# Patient Record
Sex: Female | Born: 1950 | Race: Black or African American | Hispanic: No | Smoking: Former smoker
Health system: Southern US, Community
[De-identification: ages and names within clinical notes are randomized; demographics above are authoritative.]

## PROBLEM LIST (undated history)

## (undated) DIAGNOSIS — I671 Cerebral aneurysm, nonruptured: Secondary | ICD-10-CM

## (undated) DIAGNOSIS — I10 Essential (primary) hypertension: Secondary | ICD-10-CM

## (undated) DIAGNOSIS — E119 Type 2 diabetes mellitus without complications: Secondary | ICD-10-CM

## (undated) DIAGNOSIS — J45909 Unspecified asthma, uncomplicated: Secondary | ICD-10-CM

## (undated) DIAGNOSIS — J449 Chronic obstructive pulmonary disease, unspecified: Secondary | ICD-10-CM

## (undated) DIAGNOSIS — I509 Heart failure, unspecified: Secondary | ICD-10-CM

## (undated) DIAGNOSIS — I639 Cerebral infarction, unspecified: Secondary | ICD-10-CM

## (undated) DIAGNOSIS — M1990 Unspecified osteoarthritis, unspecified site: Secondary | ICD-10-CM

## (undated) HISTORY — DX: Heart failure, unspecified: I50.9

---

## 2001-11-16 DIAGNOSIS — I639 Cerebral infarction, unspecified: Secondary | ICD-10-CM

## 2001-11-16 HISTORY — DX: Cerebral infarction, unspecified: I63.9

## 2004-09-03 ENCOUNTER — Ambulatory Visit: Payer: Self-pay

## 2004-11-21 ENCOUNTER — Ambulatory Visit: Payer: Self-pay | Admitting: Family Medicine

## 2004-12-19 ENCOUNTER — Ambulatory Visit: Payer: Self-pay | Admitting: Family Medicine

## 2005-01-02 ENCOUNTER — Ambulatory Visit: Payer: Self-pay | Admitting: Family Medicine

## 2005-01-30 ENCOUNTER — Ambulatory Visit: Payer: Self-pay | Admitting: Family Medicine

## 2005-02-06 ENCOUNTER — Ambulatory Visit: Payer: Self-pay | Admitting: Family Medicine

## 2005-02-20 ENCOUNTER — Ambulatory Visit: Payer: Self-pay | Admitting: Family Medicine

## 2005-03-23 ENCOUNTER — Ambulatory Visit: Payer: Self-pay | Admitting: Family Medicine

## 2005-04-14 ENCOUNTER — Ambulatory Visit (HOSPITAL_BASED_OUTPATIENT_CLINIC_OR_DEPARTMENT_OTHER): Admission: RE | Admit: 2005-04-14 | Discharge: 2005-04-14 | Payer: Self-pay | Admitting: Family Medicine

## 2005-04-14 ENCOUNTER — Encounter: Payer: Self-pay | Admitting: Pulmonary Disease

## 2005-04-30 ENCOUNTER — Ambulatory Visit: Payer: Self-pay | Admitting: Family Medicine

## 2005-05-01 ENCOUNTER — Ambulatory Visit: Payer: Self-pay | Admitting: Pulmonary Disease

## 2005-05-14 ENCOUNTER — Ambulatory Visit: Payer: Self-pay | Admitting: Family Medicine

## 2005-06-05 ENCOUNTER — Ambulatory Visit: Payer: Self-pay | Admitting: Pulmonary Disease

## 2005-06-18 ENCOUNTER — Other Ambulatory Visit: Admission: RE | Admit: 2005-06-18 | Discharge: 2005-06-18 | Payer: Self-pay | Admitting: Family Medicine

## 2005-06-18 ENCOUNTER — Ambulatory Visit: Payer: Self-pay | Admitting: Family Medicine

## 2005-06-30 ENCOUNTER — Ambulatory Visit: Payer: Self-pay | Admitting: Family Medicine

## 2005-07-13 ENCOUNTER — Ambulatory Visit: Payer: Self-pay | Admitting: Gastroenterology

## 2005-07-15 ENCOUNTER — Encounter: Admission: RE | Admit: 2005-07-15 | Discharge: 2005-07-15 | Payer: Self-pay | Admitting: Family Medicine

## 2005-07-16 ENCOUNTER — Ambulatory Visit: Payer: Self-pay | Admitting: Pulmonary Disease

## 2005-07-23 ENCOUNTER — Ambulatory Visit: Payer: Self-pay | Admitting: Family Medicine

## 2005-08-10 ENCOUNTER — Ambulatory Visit: Payer: Self-pay | Admitting: Cardiology

## 2005-08-12 ENCOUNTER — Encounter (INDEPENDENT_AMBULATORY_CARE_PROVIDER_SITE_OTHER): Payer: Self-pay | Admitting: Specialist

## 2005-08-12 ENCOUNTER — Ambulatory Visit: Payer: Self-pay | Admitting: Gastroenterology

## 2005-08-17 ENCOUNTER — Ambulatory Visit: Payer: Self-pay | Admitting: Cardiology

## 2005-08-21 ENCOUNTER — Ambulatory Visit: Payer: Self-pay | Admitting: Family Medicine

## 2005-08-21 ENCOUNTER — Ambulatory Visit: Payer: Self-pay | Admitting: Internal Medicine

## 2005-08-28 ENCOUNTER — Ambulatory Visit: Payer: Self-pay | Admitting: Internal Medicine

## 2005-09-01 ENCOUNTER — Ambulatory Visit: Payer: Self-pay | Admitting: Family Medicine

## 2005-09-07 ENCOUNTER — Ambulatory Visit: Payer: Self-pay | Admitting: Family Medicine

## 2005-09-11 ENCOUNTER — Ambulatory Visit: Payer: Self-pay | Admitting: Cardiology

## 2005-09-25 ENCOUNTER — Ambulatory Visit: Payer: Self-pay | Admitting: Cardiology

## 2005-10-12 ENCOUNTER — Ambulatory Visit: Payer: Self-pay | Admitting: Family Medicine

## 2005-10-16 ENCOUNTER — Ambulatory Visit: Payer: Self-pay | Admitting: Family Medicine

## 2005-10-19 ENCOUNTER — Ambulatory Visit: Payer: Self-pay | Admitting: Cardiology

## 2005-10-30 ENCOUNTER — Ambulatory Visit: Payer: Self-pay | Admitting: Cardiology

## 2005-11-02 ENCOUNTER — Ambulatory Visit (HOSPITAL_COMMUNITY): Admission: RE | Admit: 2005-11-02 | Discharge: 2005-11-02 | Payer: Self-pay | Admitting: Obstetrics and Gynecology

## 2005-11-04 ENCOUNTER — Ambulatory Visit: Payer: Self-pay | Admitting: Internal Medicine

## 2005-11-10 ENCOUNTER — Ambulatory Visit: Payer: Self-pay | Admitting: Internal Medicine

## 2005-11-21 ENCOUNTER — Encounter: Admission: RE | Admit: 2005-11-21 | Discharge: 2005-11-21 | Payer: Self-pay | Admitting: Family Medicine

## 2005-11-23 ENCOUNTER — Ambulatory Visit: Payer: Self-pay | Admitting: Cardiology

## 2005-11-26 ENCOUNTER — Other Ambulatory Visit: Admission: RE | Admit: 2005-11-26 | Discharge: 2005-11-26 | Payer: Self-pay | Admitting: Obstetrics and Gynecology

## 2005-12-07 ENCOUNTER — Ambulatory Visit: Payer: Self-pay | Admitting: Internal Medicine

## 2005-12-09 ENCOUNTER — Ambulatory Visit: Payer: Self-pay | Admitting: Pulmonary Disease

## 2006-01-07 ENCOUNTER — Ambulatory Visit: Payer: Self-pay | Admitting: Internal Medicine

## 2006-02-02 ENCOUNTER — Ambulatory Visit: Payer: Self-pay | Admitting: Cardiology

## 2006-02-23 ENCOUNTER — Ambulatory Visit: Payer: Self-pay | Admitting: Cardiology

## 2006-02-26 ENCOUNTER — Ambulatory Visit: Payer: Self-pay | Admitting: Family Medicine

## 2006-03-23 ENCOUNTER — Ambulatory Visit: Payer: Self-pay | Admitting: *Deleted

## 2006-04-06 ENCOUNTER — Ambulatory Visit: Payer: Self-pay | Admitting: *Deleted

## 2006-04-13 ENCOUNTER — Ambulatory Visit: Payer: Self-pay | Admitting: Internal Medicine

## 2006-04-22 ENCOUNTER — Ambulatory Visit: Payer: Self-pay | Admitting: Internal Medicine

## 2006-05-10 ENCOUNTER — Ambulatory Visit: Payer: Self-pay | Admitting: Cardiology

## 2006-06-01 ENCOUNTER — Ambulatory Visit: Payer: Self-pay | Admitting: Family Medicine

## 2006-06-07 ENCOUNTER — Ambulatory Visit: Payer: Self-pay | Admitting: Cardiology

## 2006-06-17 ENCOUNTER — Ambulatory Visit: Payer: Self-pay | Admitting: Family Medicine

## 2006-06-25 ENCOUNTER — Ambulatory Visit: Payer: Self-pay | Admitting: Family Medicine

## 2006-06-28 ENCOUNTER — Ambulatory Visit: Payer: Self-pay | Admitting: Cardiovascular Disease

## 2006-07-26 ENCOUNTER — Ambulatory Visit: Payer: Self-pay | Admitting: Cardiology

## 2006-08-23 ENCOUNTER — Ambulatory Visit: Payer: Self-pay | Admitting: Cardiovascular Disease

## 2006-08-30 ENCOUNTER — Ambulatory Visit: Payer: Self-pay | Admitting: Family Medicine

## 2006-09-13 ENCOUNTER — Ambulatory Visit: Payer: Self-pay | Admitting: Family Medicine

## 2006-09-13 LAB — CONVERTED CEMR LAB
AST: 21 units/L (ref 0–37)
Albumin: 3.8 g/dL (ref 3.5–5.2)
BUN: 8 mg/dL (ref 6–23)
Basophils Relative: 1.2 % — ABNORMAL HIGH (ref 0.0–1.0)
Bilirubin, Direct: 0.1 mg/dL (ref 0.0–0.3)
CO2: 32 meq/L (ref 19–32)
Calcium: 8.9 mg/dL (ref 8.4–10.5)
Chloride: 109 meq/L (ref 96–112)
Eosinophil percent: 1.7 % (ref 0.0–5.0)
GFR calc non Af Amer: 69 mL/min
Glucose, Bld: 109 mg/dL — ABNORMAL HIGH (ref 70–99)
HCT: 43.8 % (ref 36.0–46.0)
Hemoglobin: 14.3 g/dL (ref 12.0–15.0)
Lymphocytes Relative: 40.3 % (ref 12.0–46.0)
MCV: 92.4 fL (ref 78.0–100.0)
Monocytes Relative: 9.4 % (ref 3.0–11.0)
Neutro Abs: 2.4 10*3/uL (ref 1.4–7.7)
Neutrophils Relative %: 47.4 % (ref 43.0–77.0)
Platelets: 233 10*3/uL (ref 150–400)
RDW: 14.2 % (ref 11.5–14.6)
Sodium: 146 meq/L — ABNORMAL HIGH (ref 135–145)
Total Bilirubin: 0.5 mg/dL (ref 0.3–1.2)
Total Protein: 7.1 g/dL (ref 6.0–8.3)
WBC: 5.2 10*3/uL (ref 4.5–10.5)

## 2006-09-20 ENCOUNTER — Ambulatory Visit: Payer: Self-pay | Admitting: Cardiology

## 2006-10-05 ENCOUNTER — Ambulatory Visit: Payer: Self-pay | Admitting: Family Medicine

## 2006-10-20 ENCOUNTER — Ambulatory Visit: Payer: Self-pay | Admitting: Cardiology

## 2006-11-17 ENCOUNTER — Ambulatory Visit: Payer: Self-pay | Admitting: *Deleted

## 2006-12-16 ENCOUNTER — Ambulatory Visit: Payer: Self-pay | Admitting: Cardiology

## 2007-01-07 ENCOUNTER — Ambulatory Visit: Payer: Self-pay | Admitting: Family Medicine

## 2007-01-07 LAB — CONVERTED CEMR LAB: CO2: 24 meq/L (ref 19–32)

## 2007-02-16 ENCOUNTER — Ambulatory Visit: Payer: Self-pay | Admitting: Family Medicine

## 2007-02-21 ENCOUNTER — Ambulatory Visit: Payer: Self-pay | Admitting: Family Medicine

## 2007-02-21 ENCOUNTER — Encounter: Payer: Self-pay | Admitting: Family Medicine

## 2007-02-21 LAB — CONVERTED CEMR LAB
INR: 19.1
Prothrombin Time: 2.5 s

## 2007-03-21 DIAGNOSIS — F3289 Other specified depressive episodes: Secondary | ICD-10-CM | POA: Insufficient documentation

## 2007-03-21 DIAGNOSIS — E785 Hyperlipidemia, unspecified: Secondary | ICD-10-CM

## 2007-03-21 DIAGNOSIS — M129 Arthropathy, unspecified: Secondary | ICD-10-CM | POA: Insufficient documentation

## 2007-03-21 DIAGNOSIS — Z86718 Personal history of other venous thrombosis and embolism: Secondary | ICD-10-CM

## 2007-03-21 DIAGNOSIS — J449 Chronic obstructive pulmonary disease, unspecified: Secondary | ICD-10-CM

## 2007-03-21 DIAGNOSIS — F329 Major depressive disorder, single episode, unspecified: Secondary | ICD-10-CM | POA: Insufficient documentation

## 2007-03-21 DIAGNOSIS — I671 Cerebral aneurysm, nonruptured: Secondary | ICD-10-CM

## 2007-03-23 ENCOUNTER — Ambulatory Visit: Payer: Self-pay | Admitting: Family Medicine

## 2007-03-24 ENCOUNTER — Encounter: Admission: RE | Admit: 2007-03-24 | Discharge: 2007-03-24 | Payer: Self-pay | Admitting: Family Medicine

## 2007-04-06 ENCOUNTER — Encounter: Payer: Self-pay | Admitting: Family Medicine

## 2007-04-06 ENCOUNTER — Ambulatory Visit: Payer: Self-pay | Admitting: Family Medicine

## 2007-04-06 LAB — CONVERTED CEMR LAB
ALT: 27 units/L (ref 0–40)
Albumin: 3.9 g/dL (ref 3.5–5.2)
Alkaline Phosphatase: 60 units/L (ref 39–117)
Bilirubin, Direct: 0.1 mg/dL (ref 0.0–0.3)

## 2007-04-18 ENCOUNTER — Encounter: Payer: Self-pay | Admitting: Family Medicine

## 2007-04-20 ENCOUNTER — Ambulatory Visit: Payer: Self-pay | Admitting: Family Medicine

## 2007-04-20 DIAGNOSIS — I10 Essential (primary) hypertension: Secondary | ICD-10-CM

## 2007-04-20 LAB — CONVERTED CEMR LAB
INR: 2
Protein, U semiquant: NEGATIVE
Prothrombin Time: 17.3 s
Urobilinogen, UA: NEGATIVE
pH: 6

## 2007-04-22 ENCOUNTER — Encounter (INDEPENDENT_AMBULATORY_CARE_PROVIDER_SITE_OTHER): Payer: Self-pay | Admitting: *Deleted

## 2007-04-25 ENCOUNTER — Encounter: Payer: Self-pay | Admitting: Family Medicine

## 2007-06-01 ENCOUNTER — Ambulatory Visit: Payer: Self-pay | Admitting: Family Medicine

## 2007-06-29 ENCOUNTER — Ambulatory Visit: Payer: Self-pay | Admitting: Family Medicine

## 2007-06-29 LAB — CONVERTED CEMR LAB: INR: 2.9

## 2007-07-12 ENCOUNTER — Encounter: Payer: Self-pay | Admitting: Family Medicine

## 2007-07-25 ENCOUNTER — Ambulatory Visit: Payer: Self-pay | Admitting: Family Medicine

## 2007-07-25 LAB — CONVERTED CEMR LAB: INR: 1.7

## 2007-07-27 LAB — CONVERTED CEMR LAB
ALT: 24 units/L (ref 0–35)
Albumin: 3.8 g/dL (ref 3.5–5.2)
Alkaline Phosphatase: 53 units/L (ref 39–117)

## 2007-08-08 ENCOUNTER — Ambulatory Visit: Payer: Self-pay | Admitting: Family Medicine

## 2007-09-19 ENCOUNTER — Ambulatory Visit: Payer: Self-pay | Admitting: Family Medicine

## 2007-09-19 LAB — CONVERTED CEMR LAB: INR: 2.4

## 2007-10-17 ENCOUNTER — Ambulatory Visit: Payer: Self-pay | Admitting: Family Medicine

## 2007-10-17 DIAGNOSIS — G4733 Obstructive sleep apnea (adult) (pediatric): Secondary | ICD-10-CM

## 2007-10-17 LAB — CONVERTED CEMR LAB: INR: 2.1

## 2007-11-15 ENCOUNTER — Ambulatory Visit: Payer: Self-pay | Admitting: Pulmonary Disease

## 2007-11-22 ENCOUNTER — Encounter: Payer: Self-pay | Admitting: Pulmonary Disease

## 2007-12-07 ENCOUNTER — Ambulatory Visit: Payer: Self-pay | Admitting: Family Medicine

## 2007-12-07 LAB — CONVERTED CEMR LAB: Prothrombin Time: 20.4 s

## 2007-12-13 ENCOUNTER — Encounter: Payer: Self-pay | Admitting: Pulmonary Disease

## 2007-12-14 ENCOUNTER — Ambulatory Visit: Payer: Self-pay | Admitting: Family Medicine

## 2007-12-14 ENCOUNTER — Telehealth (INDEPENDENT_AMBULATORY_CARE_PROVIDER_SITE_OTHER): Payer: Self-pay | Admitting: *Deleted

## 2007-12-21 LAB — CONVERTED CEMR LAB
AST: 19 units/L (ref 0–37)
Albumin: 3.9 g/dL (ref 3.5–5.2)
Bilirubin, Direct: 0.1 mg/dL (ref 0.0–0.3)
CO2: 33 meq/L — ABNORMAL HIGH (ref 19–32)
Calcium: 8.9 mg/dL (ref 8.4–10.5)
Chloride: 108 meq/L (ref 96–112)
Creatinine, Ser: 0.8 mg/dL (ref 0.4–1.2)
GFR calc Af Amer: 95 mL/min
Sodium: 145 meq/L (ref 135–145)
Total Bilirubin: 0.6 mg/dL (ref 0.3–1.2)

## 2007-12-28 ENCOUNTER — Telehealth (INDEPENDENT_AMBULATORY_CARE_PROVIDER_SITE_OTHER): Payer: Self-pay | Admitting: *Deleted

## 2007-12-29 ENCOUNTER — Encounter (INDEPENDENT_AMBULATORY_CARE_PROVIDER_SITE_OTHER): Payer: Self-pay | Admitting: *Deleted

## 2008-01-02 ENCOUNTER — Ambulatory Visit: Payer: Self-pay | Admitting: Cardiovascular Disease

## 2008-01-11 ENCOUNTER — Ambulatory Visit: Payer: Self-pay | Admitting: Family Medicine

## 2008-01-11 LAB — CONVERTED CEMR LAB
Bilirubin Urine: NEGATIVE
Glucose, Urine, Semiquant: NEGATIVE
INR: 3.1
Nitrite: NEGATIVE
Protein, U semiquant: NEGATIVE
pH: 6

## 2008-01-25 ENCOUNTER — Telehealth (INDEPENDENT_AMBULATORY_CARE_PROVIDER_SITE_OTHER): Payer: Self-pay | Admitting: *Deleted

## 2008-01-25 ENCOUNTER — Ambulatory Visit: Payer: Self-pay | Admitting: Family Medicine

## 2008-01-25 LAB — CONVERTED CEMR LAB

## 2008-02-07 ENCOUNTER — Ambulatory Visit: Payer: Self-pay | Admitting: Family Medicine

## 2008-02-07 LAB — CONVERTED CEMR LAB: INR: 5.1

## 2008-02-08 ENCOUNTER — Ambulatory Visit: Payer: Self-pay | Admitting: Family Medicine

## 2008-02-08 LAB — CONVERTED CEMR LAB: INR: 3

## 2008-02-22 ENCOUNTER — Ambulatory Visit: Payer: Self-pay | Admitting: Family Medicine

## 2008-02-22 LAB — CONVERTED CEMR LAB

## 2008-03-08 ENCOUNTER — Ambulatory Visit: Payer: Self-pay | Admitting: Family Medicine

## 2008-03-08 LAB — CONVERTED CEMR LAB: INR: 4.9

## 2008-04-06 ENCOUNTER — Ambulatory Visit: Payer: Self-pay | Admitting: Internal Medicine

## 2008-04-06 LAB — CONVERTED CEMR LAB: INR: 1.5

## 2008-04-20 ENCOUNTER — Ambulatory Visit: Payer: Self-pay | Admitting: Family Medicine

## 2008-04-20 LAB — CONVERTED CEMR LAB: Prothrombin Time: 19.2 s

## 2008-05-04 ENCOUNTER — Ambulatory Visit: Payer: Self-pay | Admitting: Family Medicine

## 2008-05-04 DIAGNOSIS — M545 Low back pain: Secondary | ICD-10-CM | POA: Insufficient documentation

## 2008-05-14 ENCOUNTER — Encounter: Admission: RE | Admit: 2008-05-14 | Discharge: 2008-05-14 | Payer: Self-pay | Admitting: Family Medicine

## 2008-05-16 ENCOUNTER — Telehealth (INDEPENDENT_AMBULATORY_CARE_PROVIDER_SITE_OTHER): Payer: Self-pay | Admitting: *Deleted

## 2008-06-05 ENCOUNTER — Ambulatory Visit: Payer: Self-pay | Admitting: Family Medicine

## 2008-06-05 LAB — CONVERTED CEMR LAB
INR: 1.7
Prothrombin Time: 15.9 s

## 2008-06-20 ENCOUNTER — Encounter: Payer: Self-pay | Admitting: Family Medicine

## 2008-06-20 ENCOUNTER — Ambulatory Visit: Payer: Self-pay | Admitting: Family Medicine

## 2008-06-20 LAB — CONVERTED CEMR LAB
INR: 1.6
Prothrombin Time: 1.7 s

## 2008-06-21 ENCOUNTER — Encounter (INDEPENDENT_AMBULATORY_CARE_PROVIDER_SITE_OTHER): Payer: Self-pay | Admitting: *Deleted

## 2008-06-25 ENCOUNTER — Telehealth: Payer: Self-pay | Admitting: Family Medicine

## 2008-06-28 ENCOUNTER — Ambulatory Visit: Payer: Self-pay | Admitting: Family Medicine

## 2008-06-28 DIAGNOSIS — L259 Unspecified contact dermatitis, unspecified cause: Secondary | ICD-10-CM | POA: Insufficient documentation

## 2008-07-04 ENCOUNTER — Ambulatory Visit: Payer: Self-pay | Admitting: Family Medicine

## 2008-07-04 LAB — CONVERTED CEMR LAB: Prothrombin Time: 13.9 s

## 2008-07-18 ENCOUNTER — Ambulatory Visit: Payer: Self-pay | Admitting: Family Medicine

## 2008-07-18 LAB — CONVERTED CEMR LAB: INR: 1.8

## 2008-07-25 ENCOUNTER — Ambulatory Visit: Payer: Self-pay | Admitting: Family Medicine

## 2008-07-25 ENCOUNTER — Telehealth (INDEPENDENT_AMBULATORY_CARE_PROVIDER_SITE_OTHER): Payer: Self-pay | Admitting: *Deleted

## 2008-07-25 LAB — CONVERTED CEMR LAB
INR: 3.3
Prothrombin Time: 21.8 s

## 2008-08-08 ENCOUNTER — Ambulatory Visit: Payer: Self-pay | Admitting: Family Medicine

## 2008-09-07 ENCOUNTER — Ambulatory Visit: Payer: Self-pay | Admitting: Family Medicine

## 2008-09-07 LAB — CONVERTED CEMR LAB
INR: 2.3
Prothrombin Time: 18.5 s

## 2008-09-10 ENCOUNTER — Telehealth (INDEPENDENT_AMBULATORY_CARE_PROVIDER_SITE_OTHER): Payer: Self-pay | Admitting: *Deleted

## 2008-09-12 ENCOUNTER — Ambulatory Visit: Payer: Self-pay | Admitting: Family Medicine

## 2008-09-26 ENCOUNTER — Ambulatory Visit: Payer: Self-pay | Admitting: Family Medicine

## 2008-09-26 LAB — CONVERTED CEMR LAB

## 2008-11-01 ENCOUNTER — Ambulatory Visit: Payer: Self-pay | Admitting: Family Medicine

## 2008-11-01 LAB — CONVERTED CEMR LAB: INR: 2.3

## 2008-11-15 ENCOUNTER — Encounter: Payer: Self-pay | Admitting: Family Medicine

## 2008-11-22 ENCOUNTER — Telehealth (INDEPENDENT_AMBULATORY_CARE_PROVIDER_SITE_OTHER): Payer: Self-pay | Admitting: *Deleted

## 2008-11-29 ENCOUNTER — Ambulatory Visit: Payer: Self-pay | Admitting: Family Medicine

## 2008-11-29 LAB — CONVERTED CEMR LAB
INR: 2.4
Prothrombin Time: 18.8 s

## 2008-12-03 ENCOUNTER — Encounter: Payer: Self-pay | Admitting: Pulmonary Disease

## 2008-12-10 ENCOUNTER — Encounter: Payer: Self-pay | Admitting: Family Medicine

## 2008-12-18 ENCOUNTER — Encounter: Payer: Self-pay | Admitting: Family Medicine

## 2008-12-20 ENCOUNTER — Encounter: Payer: Self-pay | Admitting: Family Medicine

## 2008-12-24 ENCOUNTER — Ambulatory Visit: Payer: Self-pay | Admitting: Family Medicine

## 2008-12-24 DIAGNOSIS — R252 Cramp and spasm: Secondary | ICD-10-CM

## 2008-12-24 LAB — CONVERTED CEMR LAB
INR: 2.3
Prothrombin Time: 18.7 s

## 2008-12-27 ENCOUNTER — Ambulatory Visit: Payer: Self-pay | Admitting: Family Medicine

## 2009-01-09 LAB — CONVERTED CEMR LAB
Albumin: 3.9 g/dL (ref 3.5–5.2)
BUN: 10 mg/dL (ref 6–23)
Chloride: 107 meq/L (ref 96–112)
Cholesterol: 210 mg/dL (ref 0–200)
Creatinine, Ser: 0.7 mg/dL (ref 0.4–1.2)
Direct LDL: 141.5 mg/dL
GFR calc Af Amer: 110 mL/min
GFR calc non Af Amer: 91 mL/min
HDL: 46.9 mg/dL (ref >39.0)
Total Bilirubin: 0.8 mg/dL (ref 0.3–1.2)
Total CHOL/HDL Ratio: 4.5
VLDL: 17 mg/dL (ref 0–40)

## 2009-01-10 ENCOUNTER — Telehealth (INDEPENDENT_AMBULATORY_CARE_PROVIDER_SITE_OTHER): Payer: Self-pay | Admitting: *Deleted

## 2009-01-21 ENCOUNTER — Ambulatory Visit: Payer: Self-pay | Admitting: Family Medicine

## 2009-01-21 LAB — CONVERTED CEMR LAB
INR: 2.9
Prothrombin Time: 20.6 s

## 2009-01-22 ENCOUNTER — Encounter: Payer: Self-pay | Admitting: Family Medicine

## 2009-02-07 ENCOUNTER — Other Ambulatory Visit: Admission: RE | Admit: 2009-02-07 | Discharge: 2009-02-07 | Payer: Self-pay | Admitting: Family Medicine

## 2009-02-07 ENCOUNTER — Ambulatory Visit: Payer: Self-pay | Admitting: Family Medicine

## 2009-02-07 ENCOUNTER — Encounter: Payer: Self-pay | Admitting: Family Medicine

## 2009-02-07 DIAGNOSIS — K921 Melena: Secondary | ICD-10-CM

## 2009-02-08 ENCOUNTER — Encounter (INDEPENDENT_AMBULATORY_CARE_PROVIDER_SITE_OTHER): Payer: Self-pay | Admitting: *Deleted

## 2009-02-11 ENCOUNTER — Encounter (INDEPENDENT_AMBULATORY_CARE_PROVIDER_SITE_OTHER): Payer: Self-pay | Admitting: *Deleted

## 2009-02-22 ENCOUNTER — Telehealth (INDEPENDENT_AMBULATORY_CARE_PROVIDER_SITE_OTHER): Payer: Self-pay | Admitting: *Deleted

## 2009-02-22 ENCOUNTER — Ambulatory Visit: Payer: Self-pay | Admitting: Family Medicine

## 2009-02-22 LAB — CONVERTED CEMR LAB: Prothrombin Time: 19.7 s

## 2009-02-25 ENCOUNTER — Telehealth (INDEPENDENT_AMBULATORY_CARE_PROVIDER_SITE_OTHER): Payer: Self-pay | Admitting: *Deleted

## 2009-02-28 ENCOUNTER — Encounter: Payer: Self-pay | Admitting: Family Medicine

## 2009-03-01 ENCOUNTER — Encounter: Admission: RE | Admit: 2009-03-01 | Discharge: 2009-03-01 | Payer: Self-pay | Admitting: Family Medicine

## 2009-03-01 ENCOUNTER — Encounter: Payer: Self-pay | Admitting: Family Medicine

## 2009-03-01 ENCOUNTER — Telehealth (INDEPENDENT_AMBULATORY_CARE_PROVIDER_SITE_OTHER): Payer: Self-pay | Admitting: *Deleted

## 2009-03-06 ENCOUNTER — Encounter (INDEPENDENT_AMBULATORY_CARE_PROVIDER_SITE_OTHER): Payer: Self-pay | Admitting: *Deleted

## 2009-03-14 ENCOUNTER — Encounter: Payer: Self-pay | Admitting: Family Medicine

## 2009-03-18 ENCOUNTER — Telehealth (INDEPENDENT_AMBULATORY_CARE_PROVIDER_SITE_OTHER): Payer: Self-pay | Admitting: *Deleted

## 2009-03-22 ENCOUNTER — Ambulatory Visit: Payer: Self-pay | Admitting: Family Medicine

## 2009-03-22 LAB — CONVERTED CEMR LAB: INR: 2.6

## 2009-03-29 ENCOUNTER — Encounter: Payer: Self-pay | Admitting: Family Medicine

## 2009-04-12 ENCOUNTER — Encounter: Payer: Self-pay | Admitting: Family Medicine

## 2009-04-16 ENCOUNTER — Encounter: Payer: Self-pay | Admitting: Family Medicine

## 2009-04-19 ENCOUNTER — Ambulatory Visit: Payer: Self-pay | Admitting: Family Medicine

## 2009-04-19 DIAGNOSIS — B354 Tinea corporis: Secondary | ICD-10-CM | POA: Insufficient documentation

## 2009-04-19 LAB — CONVERTED CEMR LAB: INR: 2.3

## 2009-04-25 ENCOUNTER — Telehealth (INDEPENDENT_AMBULATORY_CARE_PROVIDER_SITE_OTHER): Payer: Self-pay | Admitting: *Deleted

## 2009-04-26 ENCOUNTER — Telehealth (INDEPENDENT_AMBULATORY_CARE_PROVIDER_SITE_OTHER): Payer: Self-pay | Admitting: *Deleted

## 2009-04-30 ENCOUNTER — Ambulatory Visit: Payer: Self-pay | Admitting: Family Medicine

## 2009-04-30 DIAGNOSIS — F17200 Nicotine dependence, unspecified, uncomplicated: Secondary | ICD-10-CM

## 2009-05-01 ENCOUNTER — Encounter: Payer: Self-pay | Admitting: Family Medicine

## 2009-05-03 ENCOUNTER — Telehealth (INDEPENDENT_AMBULATORY_CARE_PROVIDER_SITE_OTHER): Payer: Self-pay | Admitting: *Deleted

## 2009-05-06 ENCOUNTER — Telehealth (INDEPENDENT_AMBULATORY_CARE_PROVIDER_SITE_OTHER): Payer: Self-pay | Admitting: *Deleted

## 2009-05-07 ENCOUNTER — Ambulatory Visit: Payer: Self-pay | Admitting: Family Medicine

## 2009-05-16 ENCOUNTER — Ambulatory Visit: Payer: Self-pay | Admitting: Pulmonary Disease

## 2009-05-16 ENCOUNTER — Ambulatory Visit (HOSPITAL_COMMUNITY): Admission: RE | Admit: 2009-05-16 | Discharge: 2009-05-16 | Payer: Self-pay | Admitting: Pulmonary Disease

## 2009-05-16 DIAGNOSIS — R930 Abnormal findings on diagnostic imaging of skull and head, not elsewhere classified: Secondary | ICD-10-CM

## 2009-05-21 ENCOUNTER — Telehealth (INDEPENDENT_AMBULATORY_CARE_PROVIDER_SITE_OTHER): Payer: Self-pay | Admitting: *Deleted

## 2009-05-21 DIAGNOSIS — R0902 Hypoxemia: Secondary | ICD-10-CM | POA: Insufficient documentation

## 2009-05-28 ENCOUNTER — Encounter: Payer: Self-pay | Admitting: Family Medicine

## 2009-05-30 ENCOUNTER — Encounter: Payer: Self-pay | Admitting: Family Medicine

## 2009-05-31 ENCOUNTER — Ambulatory Visit: Payer: Self-pay | Admitting: Family Medicine

## 2009-05-31 LAB — CONVERTED CEMR LAB: Prothrombin Time: 14.7 s

## 2009-06-07 ENCOUNTER — Ambulatory Visit: Payer: Self-pay | Admitting: Family Medicine

## 2009-06-10 ENCOUNTER — Telehealth (INDEPENDENT_AMBULATORY_CARE_PROVIDER_SITE_OTHER): Payer: Self-pay | Admitting: *Deleted

## 2009-06-14 ENCOUNTER — Ambulatory Visit: Payer: Self-pay | Admitting: Family Medicine

## 2009-06-14 LAB — CONVERTED CEMR LAB: Prothrombin Time: 16.6 s

## 2009-06-17 ENCOUNTER — Encounter: Payer: Self-pay | Admitting: Pulmonary Disease

## 2009-06-18 ENCOUNTER — Encounter: Admission: RE | Admit: 2009-06-18 | Discharge: 2009-09-16 | Payer: Self-pay | Admitting: Family Medicine

## 2009-06-18 ENCOUNTER — Encounter: Payer: Self-pay | Admitting: Family Medicine

## 2009-06-26 ENCOUNTER — Encounter: Payer: Self-pay | Admitting: Family Medicine

## 2009-06-28 ENCOUNTER — Ambulatory Visit: Payer: Self-pay | Admitting: Family Medicine

## 2009-06-28 DIAGNOSIS — R21 Rash and other nonspecific skin eruption: Secondary | ICD-10-CM

## 2009-06-28 LAB — CONVERTED CEMR LAB: INR: 5.1

## 2009-07-04 ENCOUNTER — Ambulatory Visit: Payer: Self-pay | Admitting: Family Medicine

## 2009-08-01 ENCOUNTER — Encounter: Payer: Self-pay | Admitting: Pulmonary Disease

## 2009-08-01 ENCOUNTER — Ambulatory Visit: Payer: Self-pay | Admitting: Pulmonary Disease

## 2009-08-01 DIAGNOSIS — J383 Other diseases of vocal cords: Secondary | ICD-10-CM | POA: Insufficient documentation

## 2009-08-01 DIAGNOSIS — J310 Chronic rhinitis: Secondary | ICD-10-CM | POA: Insufficient documentation

## 2009-08-01 DIAGNOSIS — K219 Gastro-esophageal reflux disease without esophagitis: Secondary | ICD-10-CM | POA: Insufficient documentation

## 2009-08-19 ENCOUNTER — Encounter: Payer: Self-pay | Admitting: Family Medicine

## 2009-09-06 ENCOUNTER — Ambulatory Visit: Payer: Self-pay | Admitting: Pulmonary Disease

## 2009-09-26 ENCOUNTER — Telehealth: Payer: Self-pay | Admitting: Family Medicine

## 2009-10-04 ENCOUNTER — Ambulatory Visit: Payer: Self-pay | Admitting: Family Medicine

## 2009-10-04 DIAGNOSIS — M25569 Pain in unspecified knee: Secondary | ICD-10-CM | POA: Insufficient documentation

## 2009-10-14 ENCOUNTER — Encounter: Payer: Self-pay | Admitting: Family Medicine

## 2009-10-15 ENCOUNTER — Encounter: Payer: Self-pay | Admitting: Family Medicine

## 2009-10-17 ENCOUNTER — Telehealth: Payer: Self-pay | Admitting: Family Medicine

## 2009-10-29 ENCOUNTER — Ambulatory Visit: Payer: Self-pay | Admitting: Family Medicine

## 2009-10-29 DIAGNOSIS — L919 Hypertrophic disorder of the skin, unspecified: Secondary | ICD-10-CM

## 2009-10-29 DIAGNOSIS — L909 Atrophic disorder of skin, unspecified: Secondary | ICD-10-CM | POA: Insufficient documentation

## 2009-10-30 ENCOUNTER — Telehealth (INDEPENDENT_AMBULATORY_CARE_PROVIDER_SITE_OTHER): Payer: Self-pay | Admitting: *Deleted

## 2009-10-30 LAB — CONVERTED CEMR LAB
Albumin: 3.8 g/dL (ref 3.5–5.2)
BUN: 10 mg/dL (ref 6–23)
Calcium: 8.7 mg/dL (ref 8.4–10.5)
Cholesterol: 214 mg/dL — ABNORMAL HIGH (ref 0–200)
Creatinine, Ser: 0.7 mg/dL (ref 0.4–1.2)
GFR calc non Af Amer: 109.8 mL/min (ref >60)
Glucose, Bld: 128 mg/dL — ABNORMAL HIGH (ref 70–99)
HDL: 50.3 mg/dL (ref >39.00)
VLDL: 21.4 mg/dL (ref 0.0–40.0)

## 2009-11-01 ENCOUNTER — Encounter (INDEPENDENT_AMBULATORY_CARE_PROVIDER_SITE_OTHER): Payer: Self-pay | Admitting: *Deleted

## 2009-11-04 ENCOUNTER — Encounter: Admission: RE | Admit: 2009-11-04 | Discharge: 2009-11-13 | Payer: Self-pay | Admitting: Family Medicine

## 2009-11-05 ENCOUNTER — Telehealth (INDEPENDENT_AMBULATORY_CARE_PROVIDER_SITE_OTHER): Payer: Self-pay | Admitting: *Deleted

## 2009-11-06 ENCOUNTER — Telehealth: Payer: Self-pay | Admitting: Family Medicine

## 2009-11-20 ENCOUNTER — Telehealth (INDEPENDENT_AMBULATORY_CARE_PROVIDER_SITE_OTHER): Payer: Self-pay | Admitting: *Deleted

## 2009-11-28 ENCOUNTER — Encounter: Payer: Self-pay | Admitting: Family Medicine

## 2009-12-14 ENCOUNTER — Encounter: Payer: Self-pay | Admitting: Pulmonary Disease

## 2009-12-25 ENCOUNTER — Encounter: Payer: Self-pay | Admitting: Family Medicine

## 2009-12-30 ENCOUNTER — Encounter: Admission: RE | Admit: 2009-12-30 | Discharge: 2009-12-30 | Payer: Self-pay | Admitting: Family Medicine

## 2009-12-31 ENCOUNTER — Encounter: Payer: Self-pay | Admitting: Family Medicine

## 2010-01-20 ENCOUNTER — Encounter: Payer: Self-pay | Admitting: Family Medicine

## 2010-01-24 ENCOUNTER — Ambulatory Visit: Payer: Self-pay | Admitting: Family Medicine

## 2010-01-24 DIAGNOSIS — E119 Type 2 diabetes mellitus without complications: Secondary | ICD-10-CM

## 2010-02-03 LAB — CONVERTED CEMR LAB: Microalb, Ur: 4.1 mg/dL — ABNORMAL HIGH (ref 0.0–1.9)

## 2010-02-04 ENCOUNTER — Ambulatory Visit: Payer: Self-pay | Admitting: Pulmonary Disease

## 2010-02-04 LAB — CONVERTED CEMR LAB
ALT: 23 units/L (ref 0–35)
BUN: 9 mg/dL (ref 6–23)
Chloride: 106 meq/L (ref 96–112)
Cholesterol: 92 mg/dL (ref 0–200)
Creatinine, Ser: 0.6 mg/dL (ref 0.4–1.2)
Hgb A1c MFr Bld: 8 % — ABNORMAL HIGH (ref 4.6–6.5)
LDL Cholesterol: 32 mg/dL (ref 0–99)
Total Bilirubin: 0.5 mg/dL (ref 0.3–1.2)
Total Protein: 7.4 g/dL (ref 6.0–8.3)
Triglycerides: 78 mg/dL (ref 0.0–149.0)
VLDL: 15.6 mg/dL (ref 0.0–40.0)

## 2010-02-24 ENCOUNTER — Encounter: Payer: Self-pay | Admitting: Pulmonary Disease

## 2010-03-04 ENCOUNTER — Encounter: Payer: Self-pay | Admitting: Pulmonary Disease

## 2010-03-05 ENCOUNTER — Encounter: Admission: RE | Admit: 2010-03-05 | Discharge: 2010-03-05 | Payer: Self-pay | Admitting: Family Medicine

## 2010-03-10 ENCOUNTER — Encounter: Payer: Self-pay | Admitting: Family Medicine

## 2010-04-08 ENCOUNTER — Ambulatory Visit: Payer: Self-pay | Admitting: Family Medicine

## 2010-04-17 ENCOUNTER — Encounter: Payer: Self-pay | Admitting: Family Medicine

## 2010-05-02 ENCOUNTER — Ambulatory Visit: Payer: Self-pay | Admitting: Family Medicine

## 2010-05-02 DIAGNOSIS — N951 Menopausal and female climacteric states: Secondary | ICD-10-CM | POA: Insufficient documentation

## 2010-05-05 ENCOUNTER — Ambulatory Visit: Payer: Self-pay | Admitting: Pulmonary Disease

## 2010-05-05 LAB — CONVERTED CEMR LAB
Albumin: 4.2 g/dL (ref 3.5–5.2)
BUN: 15 mg/dL (ref 6–23)
Basophils Absolute: 0 10*3/uL (ref 0.0–0.1)
Basophils Relative: 0.4 % (ref 0.0–3.0)
CO2: 32 meq/L (ref 19–32)
Chloride: 106 meq/L (ref 96–112)
Cholesterol: 115 mg/dL (ref 0–200)
Creatinine,U: 118.7 mg/dL
Glucose, Bld: 126 mg/dL — ABNORMAL HIGH (ref 70–99)
HCT: 43.8 % (ref 36.0–46.0)
Hemoglobin: 14.4 g/dL (ref 12.0–15.0)
Hgb A1c MFr Bld: 6.6 % — ABNORMAL HIGH (ref 4.6–6.5)
Lymphs Abs: 2.1 10*3/uL (ref 0.7–4.0)
MCHC: 32.9 g/dL (ref 30.0–36.0)
MCV: 95.2 fL (ref 78.0–100.0)
Microalb Creat Ratio: 2.4 mg/g (ref 0.0–30.0)
Monocytes Absolute: 0.7 10*3/uL (ref 0.1–1.0)
Platelets: 189 10*3/uL (ref 150.0–400.0)
Potassium: 4.6 meq/L (ref 3.5–5.1)
RBC: 4.6 M/uL (ref 3.87–5.11)
RDW: 14.9 % — ABNORMAL HIGH (ref 11.5–14.6)
Sodium: 145 meq/L (ref 135–145)
TSH: 1.83 microintl units/mL (ref 0.35–5.50)
Total Bilirubin: 0.4 mg/dL (ref 0.3–1.2)
VLDL: 19.8 mg/dL (ref 0.0–40.0)

## 2010-05-12 ENCOUNTER — Encounter (HOSPITAL_COMMUNITY): Admission: RE | Admit: 2010-05-12 | Discharge: 2010-08-10 | Payer: Self-pay | Admitting: Pulmonary Disease

## 2010-05-12 ENCOUNTER — Encounter: Payer: Self-pay | Admitting: Pulmonary Disease

## 2010-05-13 ENCOUNTER — Encounter: Payer: Self-pay | Admitting: Family Medicine

## 2010-05-13 ENCOUNTER — Encounter: Admission: RE | Admit: 2010-05-13 | Discharge: 2010-05-13 | Payer: Self-pay | Admitting: Family Medicine

## 2010-05-22 ENCOUNTER — Encounter: Payer: Self-pay | Admitting: Family Medicine

## 2010-06-02 ENCOUNTER — Encounter: Payer: Self-pay | Admitting: Family Medicine

## 2010-06-03 ENCOUNTER — Encounter: Payer: Self-pay | Admitting: Family Medicine

## 2010-06-04 ENCOUNTER — Encounter: Payer: Self-pay | Admitting: Family Medicine

## 2010-06-05 ENCOUNTER — Encounter: Payer: Self-pay | Admitting: Family Medicine

## 2010-06-16 ENCOUNTER — Encounter: Payer: Self-pay | Admitting: Family Medicine

## 2010-07-02 ENCOUNTER — Encounter: Payer: Self-pay | Admitting: Family Medicine

## 2010-07-28 ENCOUNTER — Encounter: Payer: Self-pay | Admitting: Family Medicine

## 2010-08-04 ENCOUNTER — Encounter: Payer: Self-pay | Admitting: Family Medicine

## 2010-08-08 ENCOUNTER — Encounter: Payer: Self-pay | Admitting: Pulmonary Disease

## 2010-08-14 ENCOUNTER — Encounter: Payer: Self-pay | Admitting: Family Medicine

## 2010-08-18 ENCOUNTER — Encounter: Payer: Self-pay | Admitting: Family Medicine

## 2010-08-19 ENCOUNTER — Encounter: Payer: Self-pay | Admitting: Family Medicine

## 2010-08-25 ENCOUNTER — Encounter: Payer: Self-pay | Admitting: Family Medicine

## 2010-08-27 ENCOUNTER — Encounter: Payer: Self-pay | Admitting: Family Medicine

## 2010-08-28 ENCOUNTER — Encounter: Payer: Self-pay | Admitting: Family Medicine

## 2010-09-04 ENCOUNTER — Ambulatory Visit: Payer: Self-pay | Admitting: Family Medicine

## 2010-09-12 ENCOUNTER — Ambulatory Visit: Payer: Self-pay | Admitting: Family Medicine

## 2010-09-18 ENCOUNTER — Encounter: Payer: Self-pay | Admitting: Family Medicine

## 2010-09-22 ENCOUNTER — Encounter: Payer: Self-pay | Admitting: Family Medicine

## 2010-10-07 ENCOUNTER — Encounter: Payer: Self-pay | Admitting: Family Medicine

## 2010-10-16 ENCOUNTER — Encounter: Payer: Self-pay | Admitting: Family Medicine

## 2010-10-17 ENCOUNTER — Telehealth (INDEPENDENT_AMBULATORY_CARE_PROVIDER_SITE_OTHER): Payer: Self-pay | Admitting: *Deleted

## 2010-10-22 ENCOUNTER — Encounter: Payer: Self-pay | Admitting: Family Medicine

## 2010-10-23 ENCOUNTER — Ambulatory Visit: Payer: Self-pay | Admitting: Family Medicine

## 2010-11-03 LAB — CONVERTED CEMR LAB
ALT: 15 units/L (ref 0–35)
AST: 18 units/L (ref 0–37)
Alkaline Phosphatase: 48 units/L (ref 39–117)
BUN: 12 mg/dL (ref 6–23)
Basophils Absolute: 0 10*3/uL (ref 0.0–0.1)
Calcium: 9.2 mg/dL (ref 8.4–10.5)
Eosinophils Relative: 1 % (ref 0.0–5.0)
GFR calc non Af Amer: 119.2 mL/min (ref >60.00)
HCT: 44.7 % (ref 36.0–46.0)
HDL: 36 mg/dL — ABNORMAL LOW (ref >39.00)
Hemoglobin: 14.8 g/dL (ref 12.0–15.0)
Hgb A1c MFr Bld: 5.9 % (ref 4.6–6.5)
LDL Cholesterol: 62 mg/dL (ref 0–99)
Lymphocytes Relative: 39.4 % (ref 12.0–46.0)
Lymphs Abs: 2.1 10*3/uL (ref 0.7–4.0)
Microalb Creat Ratio: 1.6 mg/g (ref 0.0–30.0)
Monocytes Relative: 11.3 % (ref 3.0–12.0)
Platelets: 195 10*3/uL (ref 150.0–400.0)
Potassium: 4.5 meq/L (ref 3.5–5.1)
RDW: 14.7 % — ABNORMAL HIGH (ref 11.5–14.6)
Sodium: 143 meq/L (ref 135–145)
Total Bilirubin: 0.5 mg/dL (ref 0.3–1.2)
VLDL: 13.4 mg/dL (ref 0.0–40.0)
WBC: 5.4 10*3/uL (ref 4.5–10.5)

## 2010-11-06 ENCOUNTER — Encounter: Payer: Self-pay | Admitting: Family Medicine

## 2010-11-19 ENCOUNTER — Encounter: Payer: Self-pay | Admitting: Family Medicine

## 2010-11-19 DIAGNOSIS — E119 Type 2 diabetes mellitus without complications: Secondary | ICD-10-CM

## 2010-11-19 DIAGNOSIS — R269 Unspecified abnormalities of gait and mobility: Secondary | ICD-10-CM

## 2010-11-19 DIAGNOSIS — I749 Embolism and thrombosis of unspecified artery: Secondary | ICD-10-CM

## 2010-11-19 DIAGNOSIS — M1710 Unilateral primary osteoarthritis, unspecified knee: Secondary | ICD-10-CM

## 2010-11-27 ENCOUNTER — Encounter: Payer: Self-pay | Admitting: Family Medicine

## 2010-11-28 ENCOUNTER — Encounter: Payer: Self-pay | Admitting: Family Medicine

## 2010-12-05 ENCOUNTER — Encounter: Payer: Self-pay | Admitting: Family Medicine

## 2010-12-06 ENCOUNTER — Encounter: Payer: Self-pay | Admitting: Obstetrics and Gynecology

## 2010-12-07 ENCOUNTER — Encounter: Payer: Self-pay | Admitting: Family Medicine

## 2010-12-14 LAB — CONVERTED CEMR LAB
AST: 42 units/L — ABNORMAL HIGH (ref 0–37)
Albumin: 4 g/dL (ref 3.5–5.2)
Alkaline Phosphatase: 63 units/L (ref 39–117)
Chloride: 107 meq/L (ref 96–112)
GFR calc non Af Amer: 131.49 mL/min (ref >60)
Glucose, Bld: 150 mg/dL — ABNORMAL HIGH (ref 70–99)
Glucose, Urine, Semiquant: NEGATIVE
HCT: 44.3 % (ref 36.0–46.0)
HDL: 50.7 mg/dL (ref >39.00)
Hemoglobin: 14.6 g/dL (ref 12.0–15.0)
MCV: 94.6 fL (ref 78.0–100.0)
Nitrite: NEGATIVE
Potassium: 4 meq/L (ref 3.5–5.1)
RDW: 13.8 % (ref 11.5–14.6)
Sodium: 143 meq/L (ref 135–145)
Specific Gravity, Urine: 1.02
TSH: 1.14 microintl units/mL (ref 0.35–5.50)
Total Protein: 7.1 g/dL (ref 6.0–8.3)
WBC Urine, dipstick: NEGATIVE
pH: 5

## 2010-12-16 NOTE — Assessment & Plan Note (Signed)
Summary: RTO 3 MONTH/KN   Vital Signs:  Patient profile:   60 year old female Menstrual status:  postmenopausal Weight:      282.6 pounds O2 Sat:      96 % on Room air Temp:     96.9 degrees F oral Pulse rate:   74 / minute Pulse rhythm:   regular BP sitting:   110 / 64  (left arm) Cuff size:   large  Vitals Entered By: Almeta Monas CMA Duncan Dull) (September 04, 2010 4:10 PM)  O2 Flow:  Room air CC: pt here for a F/U   History of Present Illness: Pt here f/u DM , htn, and lipid.  Pt is supposed to be on O2 but was trying to get home care to pick up o2 because she can not afford it.  Pt states her meds are over $300 a month.    Hyperlipidemia follow-up      This is a 60 year old woman who presents for Hyperlipidemia follow-up.  The patient complains of fatigue, but denies muscle aches, GI upset, abdominal pain, flushing, itching, constipation, and diarrhea.  Other symptoms include exercise intolerance.  The patient denies the following symptoms: chest pain/pressure, dypsnea, palpitations, syncope, and pedal edema.  Compliance with medications (by patient report) has been intermittent.  Dietary compliance has been fair.  The patient reports no exercise.    Hypertension follow-up      The patient also presents for Hypertension follow-up.  The patient complains of fatigue, but denies lightheadedness, urinary frequency, headaches, edema, impotence, and rash.  Associated symptoms include exercise intolerance and dyspnea.  The patient denies the following associated symptoms: chest pain, chest pressure, palpitations, syncope, leg edema, and pedal edema.  Compliance with medications (by patient report) has been near 100%.  The patient reports that dietary compliance has been fair.  The patient reports no exercise.  Adjunctive measures currently used by the patient include salt restriction.    Type 1 diabetes mellitus follow-up      The patient is also here for Type 2 diabetes mellitus follow-up.   The patient denies polyuria, polydipsia, blurred vision, self managed hypoglycemia, hypoglycemia requiring help, weight loss, weight gain, and numbness of extremities.  The patient denies the following symptoms: neuropathic pain, chest pain, vomiting, orthostatic symptoms, poor wound healing, intermittent claudication, vision loss, and foot ulcer.  Since the last visit the patient reports poor dietary compliance, not exercising regularly, and monitoring blood glucose.  The patient has been measuring capillary blood glucose before breakfast.  Since the last visit, the patient reports having had eye care by an ophthalmologist.    Current Medications (verified): 1)  Accuneb 0.63 Mg/23ml Nebu (Albuterol Sulfate) .... One Vial Nebulized Qid As Needed 2)  Elite Nebulizer System  Misc (Nebulizers) .... As Directed 3)  Plavix 75 Mg Tabs (Clopidogrel Bisulfate) .Marland Kitchen.. 1 By Mouth Once Daily 4)  Crestor 20 Mg Tabs (Rosuvastatin Calcium) .... Take One Tablet Each Evening At Bedtime. 5)  Ultram 50 Mg Tabs (Tramadol Hcl) .Marland Kitchen.. 1 By Mouth Q6h As Needed 6)  Norvasc 10 Mg Tabs (Amlodipine Besylate) .Marland Kitchen.. 1 By Mouth Once Daily 7)  Kombiglyze Xr 03-999 Mg Xr24h-Tab (Saxagliptin-Metformin) .Marland Kitchen.. 1 By Mouth Qd  Allergies (verified): 1)  Augmentin  Past History:  Past medical, surgical, family and social histories (including risk factors) reviewed for relevance to current acute and chronic problems.  Past Medical History: Reviewed history from 05/05/2010 and no changes required. 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 coiling and clamps from aneurysm  Family History: Reviewed history from 05/16/2009 and no changes required. Brother kidney transplant Family History of Asthma Family History Kidney disease Family History Lung cancer-father deceased due to lung ca Family History Diabetes 1st degree relative allergies-mother Leukemia-maternal grandmother  Social History: Reviewed history from 05/16/2009 and no changes required. Single Regular exercise-no Current Smoker1ppdx27yr. pt currently smoking 1/2ppd Divorced Alcohol use-no Drug use-no Pt lives alone.  Review of Systems      See HPI  Physical Exam  General:  Well-developed,well-nourished,in no acute distress; alert,appropriate and cooperative throughout examinationoverweight-appearing and mild distress.   Neck:  No deformities, masses, or tenderness noted. Lungs:  R wheezes and L wheezes.   Heart:  Normal rate and regular rhythm. S1 and S2 normal without gallop, murmur, click, rub or other extra sounds. Extremities:  No clubbing, cyanosis, edema, or deformity noted with normal full range of motion of all joints.   Neurologic:  No cranial nerve deficits noted. Station and gait are normal. Plantar reflexes are down-going bilaterally. DTRs are symmetrical throughout. Sensory, motor and coordinative functions appear intact. Skin:  Intact without suspicious lesions or rashes Cervical Nodes:  No lymphadenopathy noted Psych:  Cognition and judgment appear intact. Alert and cooperative with normal attention span and concentration. No apparent delusions, illusions, hallucinations   Impression & Recommendations:  Problem # 1:  DIABETES MELLITUS, TYPE II (ICD-250.00)  Her updated medication list for this problem includes:    Kombiglyze Xr 03-999 Mg Xr24h-tab (Saxagliptin-metformin) .Marland Kitchen... 1 by mouth qd  Labs Reviewed: Creat: 0.6 (05/02/2010)     Last Eye Exam:  cataracts (02/26/2010) Reviewed HgBA1c results: 6.6 (05/02/2010)  8.0 (01/24/2010)  Problem # 2:  HYPERTENSION (ICD-401.9)  Her updated medication list for this problem includes:    Norvasc 10 Mg Tabs (Amlodipine besylate) .Marland Kitchen... 1 by mouth once daily  BP today: 110/64 Prior BP: 134/70 (05/05/2010)  Prior 10 Yr Risk Heart Disease: Not enough information (04/20/2007)  Labs Reviewed: K+: 4.6 (05/02/2010) Creat: : 0.6 (05/02/2010)   Chol: 115 (05/02/2010)   HDL: 46.00 (05/02/2010)   LDL: 49 (05/02/2010)   TG: 99.0 (05/02/2010)  Problem # 3:  HYPERLIPIDEMIA (ICD-272.4)  Her updated medication list for this problem includes:    Crestor 20 Mg Tabs (Rosuvastatin calcium) .Marland Kitchen... Take one tablet each evening at bedtime.  Labs Reviewed: SGOT: 16 (05/02/2010)   SGPT: 16 (05/02/2010)  Prior 10 Yr Risk Heart Disease: Not enough information (04/20/2007)   HDL:46.00 (05/02/2010), 44.40 (01/24/2010)  LDL:49 (05/02/2010), 32 (01/24/2010)  Chol:115 (05/02/2010), 92 (01/24/2010)  Trig:99.0 (05/02/2010), 78.0 (01/24/2010)  Problem # 4:  COPD (ICD-496)  Her updated medication list for this problem includes:    Accuneb 0.63 Mg/58ml Nebu (Albuterol sulfate) ..... One vial nebulized qid as needed  Orders: Albuterol Sulfate Sol 1mg  unit dose (V7616) Nebulizer Tx (07371) Ipratropium inhalation sol. unit dose (G6269)  Pulmonary Functions Reviewed: FEV1: 1.21 (08/01/2009)   FEV 25-75: 0.73 (08/01/2009)   O2 sat: 96 (09/04/2010)     Vaccines Reviewed: Flu Vax: Fluvax  3+ (09/04/2010)  Complete Medication List: 1)  Accuneb 0.63 Mg/16ml Nebu (Albuterol sulfate) .... One vial nebulized qid as needed 2)  Elite Nebulizer System Misc (Nebulizers) .... As directed 3)  Plavix 75 Mg Tabs (Clopidogrel bisulfate) .Marland Kitchen.. 1 by mouth once daily 4)  Crestor 20 Mg Tabs (Rosuvastatin calcium) .... Take one tablet each evening at bedtime. 5)  Ultram 50 Mg Tabs (Tramadol hcl) .Marland Kitchen.. 1 by mouth q6h as needed 6)  Norvasc 10  Mg Tabs (Amlodipine besylate) .Marland Kitchen.. 1 by mouth once daily 7)  Kombiglyze Xr 03-999 Mg Xr24h-tab (Saxagliptin-metformin) .Marland Kitchen.. 1 by mouth qd  Other Orders: Flu Vaccine 45yrs + MEDICARE PATIENTS (Z6109) Administration Flu vaccine - MCR (U0454)   Medication Administration  Medication # 1:    Medication: Albuterol Sulfate Sol 1mg  unit dose    Diagnosis: COPD (ICD-496)    Dose: 2.5 mg/86ml    Route: inhaled    Exp Date: 10/17/2011    Lot #: [id number]    Mfr: nephron    Patient tolerated medication without complications    Given by: Almeta Monas CMA Duncan Dull) (September 04, 2010 4:51 PM)  Medication # 2:    Medication: Ipratropium inhalation sol. unit dose    Diagnosis: COPD (ICD-496)    Dose: 0.5mg /2.74ml    Route: inhaled    Exp Date: 08/17/2011    Lot #: [id number]    Mfr: nephron  Orders Added: 1)  Albuterol Sulfate Sol 1mg  unit dose [F6213] 2)  Nebulizer Tx [08657] 3)  Ipratropium inhalation sol. unit dose [J7644] 4)  Flu Vaccine 74yrs + MEDICARE PATIENTS [Q2039] 5)  Administration Flu vaccine - MCR [G0008] 6)  Est. Patient Level IV [84696] Flu Vaccine Consent Questions     Do you have a history of severe allergic reactions to this vaccine? no    Any prior history of allergic reactions to egg and/or gelatin? no    Do you have a sensitivity to the preservative Thimersol? no    Do you have a past history of Guillan-Barre Syndrome? no    Do you currently have an acute febrile illness? no    Have you ever had a severe reaction to latex? no    Vaccine information given and explained to patient? yes    Are you currently pregnant? no    Lot Number:[id number]   Exp Date:05/16/2011   Site Given  Left Deltoid IMine 41yrs + MEDICARE PATIENTS [Q2039] 5)  Administration Flu vaccine - MCR [G0008]    [email or web address]

## 2010-12-16 NOTE — Assessment & Plan Note (Signed)
Summary: 3 months/apc   Visit Type:  Follow-up Copy to:  Dr. Rana Snare Primary Provider/Referring Provider:  Loreen Freud DO  CC:  COPD..OSA.Marland KitchenMarland KitchenThe patient c/o cough with clear mucus x3 weeks and says she is wheezing all the time. She says she is unable to wear CPAP due to hot flashes..  History of Present Illness: I met Karen Perez follow up of her dyspnea with COPD, tobacco abuse, rhinitis, vocal cord dysfunction, morbid obesity, abnormal chest xray and sleep apnea.  She feels her breathing has been okay.  Her family members have been telling her otherwise.  She has not been using spiriva.  She uses symbicort intermittently.  She has not been using her nebulizer much.  She gets some wheeze, but mostly from her throat.  She has been getting some sinus congestion, and post-nasal drip.  She is not having much reflux.    She has not been using her CPAP much.  She gets hot flashes, and as a result has trouble sleeping at night.    She had an overnight oximetry from March 04, 2010:  Test time 3hrs 27 min.  Spent 85% of test time with SpO2<90%, with low SpO2 of 85%.  She has not been using her oxygen on a consistent basis.  She is due to start pulmonary rehab soon.   Current Medications (verified): 1)  Symbicort 160-4.5 Mcg/act Aero (Budesonide-Formoterol Fumarate) .... Two Puffs Two Times A Day 2)  Spiriva Handihaler 18 Mcg Caps (Tiotropium Bromide Monohydrate) .... One Puff Once Daily 3)  Accuneb 0.63 Mg/60ml Nebu (Albuterol Sulfate) .... One Vial Nebulized Qid As Needed 4)  Elite Nebulizer System  Misc (Nebulizers) .... As Directed 5)  Plavix 75 Mg Tabs (Clopidogrel Bisulfate) .Marland Kitchen.. 1 By Mouth Once Daily 6)  Crestor 20 Mg Tabs (Rosuvastatin Calcium) .... Take One Tablet Each Evening At Bedtime. 7)  Ultram 50 Mg Tabs (Tramadol Hcl) .Marland Kitchen.. 1 By Mouth Q6h As Needed 8)  Norvasc 10 Mg Tabs (Amlodipine Besylate) .Marland Kitchen.. 1 By Mouth Once Daily 9)  Kombiglyze Xr 03-999 Mg Xr24h-Tab (Saxagliptin-Metformin)  .Marland Kitchen.. 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 coiling and clamps from aneurysm  Review of Systems       The patient complains of shortness of breath with activity, non-productive cough, sore throat, nasal congestion/difficulty breathing through nose, anxiety, and hand/feet swelling.  The patient denies shortness of breath at rest, productive cough, coughing up blood, chest pain, irregular heartbeats, acid heartburn, headaches, sneezing, depression, rash, change in color of mucus, and fever.    Vital Signs:  Patient profile:   60 year old female Menstrual status:  postmenopausal Height:      65 inches (165.10 cm) Weight:      291 pounds (132.27 kg) BMI:     48.60 O2 Sat:      89 % on Room air Temp:     98.0 degrees F (36.67 degrees C) oral Pulse rate:   89 /  minute BP sitting:   134 / 70  (left arm) Cuff size:   large  Vitals Entered By: Karen Perez CMA (May 05, 2010 10:21 AM)  O2 Sat at Rest %:  89 O2 Flow:  Room air  O2 Sat Comments After resting 2-3 minutes the patients sats came up to 93% on room air. Pulse was 72.Karen Perez CMA  May 05, 2010 10:22 AM CC: COPD..OSA.Marland KitchenMarland KitchenThe patient c/o cough with clear mucus x3 weeks and says she is wheezing all the time. She says she is unable to wear CPAP due to hot flashes. Comments Medications reviewed. Daytime phone verified. Karen Perez CMA  May 05, 2010 10:22  AM  Ambulatory Pulse Oximetry  Resting; HR_71____    02 Sat_87%RA____  Lap1 (185 feet)   HR_____   02 Sat_____ Lap2 (185 feet)   HR_____   02 Sat_____    Lap3 (185 feet)   HR_____   02 Sat_____  ___Test Completed without Difficulty _X__Test Stopped due to: Patients oxygen saturation level was 87% room air to start. I received permisson  from Dr. Craige Cotta to d/c walk order, I placed pt on 2lpkm of oygen and obtained a recovery stat of 92%2lp and a HR of 71.    Denna Haggard, CMA  May 05, 2010 11:12 AM       Physical Exam  General:  obese.   Nose:  clear nasal discharge Mouth:  MP 4, enlarged tongue, no exudate Neck:  no JVD, has wheeze over throat Lungs:  diminished breath sounds, no wheezing or rales Heart:  regular rhythm, normal rate, and no murmurs.   Extremities:  no clubbing, cyanosis, edema, or deformity noted Cervical Nodes:  no significant adenopathy   Impression & Recommendations:  Problem # 1:  COPD (ICD-496) She has not been using her symbicort or spiriva.  She is not using her nebulizer that much.  I will stop her symbicort and spiriva.  She is to use her nebulizer as needed.  Depending on how often she needs this will decide if she needs to restart maintenance inhaler regimen.  She is due to start pulmonary rehab.  Problem # 2:  VOCAL CORD DISORDER (ICD-478.5)  She has some improvement, but still has persistent symptoms.  She is to continue on her sinus regimen and anti-reflux therapy.  Will stop her inhalers to see if this improves her symptoms also.  Will refer to ENT to evaluate for structural cause of her hoarseness and VCD.  If ENT evaluation is unremarkable, will then refer to speech therapy for VCD.  Problem # 3:  CHRONIC RHINITIS (ICD-472.0) She is to use nasal irrigation. Further interventions will be determined after her ENT evaluation.  Problem # 4:  HYPOXEMIA (ICD-799.02) She has persistent hypoxemia with exertion.  I have advised her to  continue with oxygen with exertion and at night.  Problem # 5:  ABNORMAL CHEST XRAY (ICD-793.1)  Chest xray from Sept. 16, 2010 did not show vague right lower lobe density.  Problem # 6:  TOBACCO USER (ICD-305.1)  Encouraged her to remain tobacco free.  Problem # 7:  OBSTRUCTIVE SLEEP APNEA (ICD-327.23)  I again explained to her the rationale for using CPAP.  I have advised her to try starting CPAP intermittently for now.  Then once her tolerance for CPAP has built up she can start using it on a more regular basis.  Complete Medication List: 1)  Accuneb 0.63 Mg/54ml Nebu (Albuterol sulfate) .... One vial nebulized qid as  needed 2)  Elite Nebulizer System Misc (Nebulizers) .... As directed 3)  Plavix 75 Mg Tabs (Clopidogrel bisulfate) .Marland Kitchen.. 1 by mouth once daily 4)  Crestor 20 Mg Tabs (Rosuvastatin calcium) .... Take one tablet each evening at bedtime. 5)  Ultram 50 Mg Tabs (Tramadol hcl) .Marland Kitchen.. 1 by mouth q6h as needed 6)  Norvasc 10 Mg Tabs (Amlodipine besylate) .Marland Kitchen.. 1 by mouth once daily 7)  Kombiglyze Xr 03-999 Mg Xr24h-tab (Saxagliptin-metformin) .Marland Kitchen.. 1 by mouth qd  Other Orders: Est. Patient Level IV (56213) ENT Referral (ENT)  Patient Instructions: 1)  Use your oxygen with exertion and sleep 2)  Stop using symbicort and spiriva 3)  Use your nebulizer up to four times per day as needed for cough, wheeze, chest congestion, or shortness or breath 4)  Will arrange for ENT referral 5)  Try using nasal irrigation (saline sinus rinse) once daily as needed  6)  Try using your CPAP machine as much as you can tolerate 7)  Follow up in 4 months

## 2010-12-16 NOTE — Letter (Signed)
Summary: CMN for Heating Pad/[facility]  CMN for Heating Pad/[facility]   Imported By: Lanelle Bal 12/03/2009 15:02:01  _____________________________________________________________________  External Attachment:    Type:   Image     Comment:   External Document

## 2010-12-16 NOTE — Letter (Signed)
Summary: CMN for CPAP Supplies/[facility]  CMN for CPAP Supplies/[facility]   Imported By: Sherian Rein 12/19/2009 11:20:47  _____________________________________________________________________  External Attachment:    Type:   Image     Comment:   External Document

## 2010-12-16 NOTE — Letter (Signed)
Summary: CMN for Power Seat/[facility]  CMN for Power Seat/[facility]   Imported By: Lanelle Bal 06/13/2010 11:47:20  _____________________________________________________________________  External Attachment:    Type:   Image     Comment:   External Document

## 2010-12-16 NOTE — Medication Information (Signed)
Summary: Diabetes Supplies/[facility]  Diabetes Supplies/[facility]   Imported By: Lanelle Bal 12/03/2009 15:02:44  _____________________________________________________________________  External Attachment:    Type:   Image     Comment:   External Document

## 2010-12-16 NOTE — Letter (Signed)
Summary: Revised CMN for Diabetes Supplies/[facility]  Revised CMN for Diabetes Supplies/[facility]   Imported By: Lanelle Bal 01/23/2010 12:45:30  _____________________________________________________________________  External Attachment:    Type:   Image     Comment:   External Document

## 2010-12-16 NOTE — Medication Information (Signed)
Summary: Letter Regarding Propoxyphene Product/[facility]  Letter Regarding Propoxyphene Product/[facility]   Imported By: Lanelle Bal 12/03/2009 14:26:40  _____________________________________________________________________  External Attachment:    Type:   Image     Comment:   External Document

## 2010-12-16 NOTE — Assessment & Plan Note (Signed)
Summary: DISCUSS DM/LAB,-- lipid, hep, bmp, hgba1c  250.00  272.4/KDC   Vital Signs:  Patient profile:   60 year old female Menstrual status:  postmenopausal Weight:      288 pounds Temp:     97.5 degrees F oral Pulse rate:   92 / minute Pulse rhythm:   regular BP sitting:   132 / 86  (left arm) Cuff size:   large  Vitals Entered By: Army Fossa CMA (January 24, 2010 10:36 AM) CC: Pt here to discuss DM, and to check BS.    History of Present Illness:  Type 1 diabetes mellitus follow-up      This is a 60 year old woman who presents with Type 2 diabetes mellitus follow-up.  The patient denies polyuria, polydipsia, blurred vision, self managed hypoglycemia, hypoglycemia requiring help, weight loss, weight gain, and numbness of extremities.  The patient denies the following symptoms: neuropathic pain, chest pain, vomiting, orthostatic symptoms, poor wound healing, intermittent claudication, vision loss, and foot ulcer.  Since the last visit the patient reports good dietary compliance, compliance with medications, not exercising regularly, and monitoring blood glucose.  The patient has been measuring capillary blood glucose before breakfast and after dinner.  Since the last visit, the patient reports having had no eye care.  Pt has appointment with optho on 3/22.     Hypertension follow-up      The patient also presents for Hypertension follow-up.  The patient denies lightheadedness, urinary frequency, headaches, edema, impotence, rash, and fatigue.  The patient denies the following associated symptoms: chest pain/pressure, exercise intolerance, dypsnea, palpitations, syncope, and pedal edema.  Compliance with medications (by patient report) has been near 100%.  The patient reports that dietary compliance has been good.  The patient reports no exercise.    Hyperlipidemia follow-up      The patient also presents for Hyperlipidemia follow-up.  The patient denies muscle aches, GI upset,  abdominal pain, flushing, itching, constipation, diarrhea, and fatigue.  The patient denies the following symptoms: chest pain/pressure, exercise intolerance, dypsnea, palpitations, syncope, and pedal edema.  Compliance with medications (by patient report) has been near 100%.  Dietary compliance has been good.  The patient reports no exercise.    Allergies: 1)  Augmentin  Past History:  Past medical, surgical, family and social histories (including risk factors) reviewed for relevance to current acute and chronic problems.  Past Medical History: Reviewed history from 09/06/2009 and no changes required. 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 coiling and clamps from aneurysm  Family History: Reviewed history from 05/16/2009 and no changes required. Brother kidney transplant Family History of Asthma Family History Kidney disease Family History Lung cancer-father deceased due to lung ca Family History Diabetes 1st degree relative allergies-mother Leukemia-maternal grandmother  Social History: Reviewed history from 05/16/2009 and no changes required. Single Regular exercise-no Current Smoker1ppdx36yr. pt currently smoking 1/2ppd Divorced Alcohol use-no Drug use-no Pt lives alone.  Review of Systems      See HPI  Physical Exam  General:  Well-developed,well-nourished,in no acute distress; alert,appropriate and cooperative throughout examinationoverweight-appearing.   Ears:  L ear normal.  Lungs:  normal respiratory effort.  lungs clear at rest some  wheezing with exertion Heart:  normal rate and Grade  2 /6 systolic ejection murmur.   Psych:  Oriented X3 and normally interactive.    Diabetes Management Exam:    Foot Exam (with socks and/or shoes not present):       Sensory-Pinprick/Light touch:          Left medial foot (L-4): normal          Left dorsal foot (L-5): normal          Left lateral foot (S-1): normal          Right medial foot (L-4): normal          Right dorsal foot (L-5): normal          Right lateral foot (S-1): normal       Sensory-Monofilament:          Left foot: normal          Right foot: normal       Inspection:          Left foot: normal          Right foot: normal       Nails:          Left foot: normal          Right foot: normal   Impression & Recommendations:  Problem # 1:  DIABETES MELLITUS, TYPE II (ICD-250.00)  Her updated medication list for this problem includes:    Glucophage Xr 500 Mg Xr24h-tab (Metformin hcl) .Marland Kitchen... 2 by mouth daily.  Orders: Venipuncture (40981) TLB-Lipid Panel (80061-LIPID) TLB-BMP (Basic Metabolic Panel-BMET) (80048-METABOL) TLB-Hepatic/Liver Function Pnl (80076-HEPATIC) TLB-A1C / Hgb A1C (Glycohemoglobin) (83036-A1C) TLB-Microalbumin/Creat Ratio, Urine (82043-MALB) Prescription Created Electronically [phone])  Labs Reviewed: Creat: 0.7 (10/29/2009)    Reviewed HgBA1c results: 7.8 (10/29/2009)  Problem # 2:  TOBACCO USER (ICD-305.1)  Encouraged smoking cessation and discussed different methods for smoking cessation.   Orders: Tobacco use cessation intermediate 3-10 minutes (82956) Prescription Created Electronically [phone])  Problem # 3:  HYPERTENSION (ICD-401.9)  Her updated medication list for this problem includes:    Norvasc 10 Mg Tabs (Amlodipine besylate) .Marland Kitchen... 1 by mouth once daily  Orders: Venipuncture (65784) TLB-Lipid Panel (80061-LIPID) TLB-BMP (Basic Metabolic Panel-BMET) (80048-METABOL) TLB-Hepatic/Liver Function Pnl  (80076-HEPATIC) TLB-A1C / Hgb A1C (Glycohemoglobin) (83036-A1C) TLB-Microalbumin/Creat Ratio, Urine (82043-MALB) Prescription Created Electronically [phone])  BP today: 132/86 Prior BP: 130/82 (10/29/2009)  Prior 10 Yr Risk Heart Disease: Not enough information (04/20/2007)  Labs Reviewed: K+: 3.3 (10/29/2009) Creat: : 0.7 (10/29/2009)   Chol: 214 (10/29/2009)   HDL: 50.30 (10/29/2009)   LDL: DEL (12/27/2008)   TG: 107.0 (10/29/2009)  Problem # 4:  HYPERLIPIDEMIA (ICD-272.4)  Her updated medication list for this problem includes:    Crestor 20 Mg Tabs (Rosuvastatin calcium) .Marland Kitchen... Take one tablet each evening at bedtime.  Orders: Venipuncture (52841) TLB-Lipid Panel (80061-LIPID) TLB-BMP (Basic Metabolic Panel-BMET) (80048-METABOL) TLB-Hepatic/Liver Function Pnl (80076-HEPATIC) TLB-A1C / Hgb A1C (Glycohemoglobin) (83036-A1C) TLB-Microalbumin/Creat Ratio, Urine (82043-MALB) Prescription Created Electronically [phone])  Labs Reviewed: SGOT: 37 (10/29/2009)   SGPT: 33 (10/29/2009)  Prior 10 Yr Risk Heart Disease: Not enough information (04/20/2007)   HDL:50.30 (10/29/2009), 50.70 (02/07/2009)  LDL:DEL (12/27/2008)  Chol:214 (10/29/2009), 221 (02/07/2009)  Trig:107.0 (10/29/2009), 105.0 (02/07/2009)  Complete Medication List: 1)  Symbicort 160-4.5 Mcg/act Aero (Budesonide-formoterol fumarate) .... Two puffs two times a day 2)  Spiriva Handihaler 18 Mcg Caps (Tiotropium bromide monohydrate) .... One  puff once daily 3)  Accuneb 0.63 Mg/68ml Nebu (Albuterol sulfate) .... One vial nebulized qid as needed 4)  Proair Hfa 108 (90 Base) Mcg/act Aers (Albuterol sulfate) .... Two puffs qid as needed 5)  Flonase 50 Mcg/act Susp (Fluticasone propionate) .... 2 sprays each nostril once daily 6)  Elite Nebulizer System Misc (Nebulizers) .... As directed 7)  Plavix 75 Mg Tabs (Clopidogrel bisulfate) .Marland Kitchen.. 1 by mouth once daily 8)  Crestor 20 Mg Tabs (Rosuvastatin calcium) .... Take one tablet each  evening at bedtime. 9)  Ultram 50 Mg Tabs (Tramadol hcl) .Marland Kitchen.. 1 by mouth q6h as needed 10)  Norvasc 10 Mg Tabs (Amlodipine besylate) .Marland Kitchen.. 1 by mouth once daily 11)  Prilosec 20 Mg Cpdr (Omeprazole) .... One by mouth two times a day 12)  Nurse, learning disability  .Marland Kitchen.. 719.46,  799.02, 278.01., 327.23,716.90, 496 13)  Glucophage Xr 500 Mg Xr24h-tab (Metformin hcl) .... 2 by mouth daily. Prescriptions: GLUCOPHAGE XR 500 MG XR24H-TAB (METFORMIN HCL) 2 by mouth daily.  #60 x 2   Entered and Authorized by:   Loreen Freud DO   Signed by:   Loreen Freud DO on 01/24/2010   Method used:   [facility]  [address] [id number]       [address]       [place], [place]  [address]       Ph: [phone] or [phone]       Fax: [phone]   RxID:   [id number]

## 2010-12-16 NOTE — Medication Information (Signed)
Summary: Diabetes Supplies/[facility]  Diabetes Supplies/[employer] [facility]   Imported By: Lanelle Bal 08/27/2010 12:43:57  _____________________________________________________________________  External Attachment:    Type:   Image     Comment:   External Document

## 2010-12-16 NOTE — Letter (Signed)
Summary: Revised CMN for Diabetes Supplies/[facility]  Revised CMN for Diabetes Supplies/[facility]   Imported By: Lanelle Bal 01/04/2010 08:58:24  _____________________________________________________________________  External Attachment:    Type:   Image     Comment:   External Document

## 2010-12-16 NOTE — Letter (Signed)
Summary: Generic Letter   at [place]  [address] [place], [place] [address]   Phone: [phone]  Fax: [phone]       10/22/2010  ELICE CRIGGER [address] [place], [place]  [address]  Dear Ms. Marmolejos,    I have tried to contact you in reference to your most recent phone call. Please call the office to update your contact information. I have included a patient assistance form for plavix, please fill out your part and return to the office so we can complete our part and fax to the company.        Sincerely,        Almeta Monas CMA (AAMA)

## 2010-12-16 NOTE — Letter (Signed)
Summary: CMN for Diabetes Supplies/[facility]  CMN for Diabetes Supplies/[facility]   Imported By: Lanelle Bal 10/06/2010 12:35:01  _____________________________________________________________________  External Attachment:    Type:   Image     Comment:   External Document

## 2010-12-16 NOTE — Miscellaneous (Signed)
Summary: ONO from 02/24/10   Clinical Lists Changes Test time 3hrs 27 min.  Spent 85% of test time with SpO2<90%, with low SpO2 of 85%.  Attempted to contact pt at listed phone number, but this was disconnected.    Will have my nurse send letter to patient, asking her to call our office to discuss results of ONO and update her contact information in our system.  Appended Document: ONO from 02/24/10 LMOMTCB at work number. Home number disconnected. Will await callback before sending letter.  Appended Document: ONO from 02/24/10 Please call pt's daughter, Inetta Fermo, on her cell, [phone].  Appended Document: ONO from 02/24/10 Returned call and discussed ONO results with pt's daughter.  Explained that she still has nocturnal hypoxemia, but most likely related to her sleep apnea.  Have recommended that Ms. Alkema reconsider the use of CPAP, and then we can re-assess whether she would also need nocturnal supplemental oxygen.

## 2010-12-16 NOTE — Medication Information (Signed)
Summary: Diabetes Supplies/[facility]  [facility]   Imported By: Lanelle Bal 09/02/2010 15:05:52  _____________________________________________________________________  External Attachment:    Type:   Image     Comment:   External Document

## 2010-12-16 NOTE — Medication Information (Signed)
Summary: [facility] Supp[facility]  [facility]   Imported By: Lanelle Bal 08/22/2010 11:02:51  _____________________________________________________________________  External Attachment:    Type:   Image     Comment:   External Document

## 2010-12-16 NOTE — Letter (Signed)
Summary: CMN for Diabetes Supplies/[facility]  CMN for Diabetes Supplies/[facility]   Imported By: Lanelle Bal 08/27/2010 12:50:55  _____________________________________________________________________  External Attachment:    Type:   Image     Comment:   External Document

## 2010-12-16 NOTE — Medication Information (Signed)
Summary: Diabetes Supplies/[facility]  [facility]   Imported By: Lanelle Bal 10/23/2010 13:19:18  _____________________________________________________________________  External Attachment:    Type:   Image     Comment:   External Document

## 2010-12-16 NOTE — Letter (Signed)
Summary: CMN for Diabetes Supplies/[facility]  CMN for Diabetes Supplies/[facility]   Imported By: Lanelle Bal 12/30/2009 12:24:52  _____________________________________________________________________  External Attachment:    Type:   Image     Comment:   External Document

## 2010-12-16 NOTE — Medication Information (Signed)
Summary: Revised Form for Diabetes Supplies/[facility]  Revised Form for Diabetes Supplies/[facility]   Imported By: Lanelle Bal 09/09/2010 12:46:02  _____________________________________________________________________  External Attachment:    Type:   Image     Comment:   External Document

## 2010-12-16 NOTE — Letter (Signed)
Summary: Generic Letter  [facility] [place] [facility] [place]  [address] [place], [place] [address]   Phone: [phone]  Fax: [phone]         09/22/2010  [name] [employer]   RE: Karen Perez Dob: March 03, 2051  Dear Mrs. High:  [name] has been a patient of mine since January 2006 and has had a history of 3 brain aneurysms in 2002, stroke in 2003 and a deep vein thrombosis in March of 2003. She also has asthma, chronic pulmonary obstructive disease, arthritis and degenerative disk disease in the lower back. The patient has been disabled since the aneurysms in 2002. The patient is unable to walk more than 1 or 2 blocks without getting very short of breath and the bus stop is 6 blocks away from her home. The patient is unsable to drive at this time secondary to having no car and no one available at the home to drive her places. [facility] transportation for this patient would be very much appreciated.  Feel free to contact me with any further questions or concerns.    Sincerely,           Loreen Freud, D.O.

## 2010-12-16 NOTE — Letter (Signed)
Summary: CMN for Back Support/[facility]  CMN for Back Support/[facility]   Imported By: Lanelle Bal 06/13/2010 11:48:07  _____________________________________________________________________  External Attachment:    Type:   Image     Comment:   External Document

## 2010-12-16 NOTE — Assessment & Plan Note (Signed)
Summary: evaluate for scooter, then labs=272.4, 250.00, 401.9, bmp, li...   Vital Signs:  Patient profile:   60 year old female Menstrual status:  postmenopausal Weight:      279.2 pounds Temp:     98.8 degrees F oral BP sitting:   132 / 68  (left arm)  Vitals Entered By: Almeta Monas CMA Duncan Dull) (October 23, 2010 10:56 AM) CC: eval for scooter-- c/o pain to both shoulders, thinks it's from the crestor   History of Present Illness: Pt here for face to face eval for scooter.  Her daughter is present.   No new concerns---still has pain in shoulders.    Pt is able to do all ADLS in house with little trouble and she has a wheelchair, cane and walker but doesn't use them.  Pt was hoping to get a scooter to take outside the house.  She didn't want to trouble anyone to take walker or cane but didn't think about the fact that taking a scooter would be harder.    Current Medications (verified): 1)  Accuneb 0.63 Mg/16ml Nebu (Albuterol Sulfate) .... One Vial Nebulized Qid As Needed 2)  Elite Nebulizer System  Misc (Nebulizers) .... As Directed 3)  Plavix 75 Mg Tabs (Clopidogrel Bisulfate) .Marland Kitchen.. 1 By Mouth Once Daily 4)  Crestor 20 Mg Tabs (Rosuvastatin Calcium) .... Take One Tablet Each Evening At Bedtime. 5)  Ultram 50 Mg Tabs (Tramadol Hcl) .Marland Kitchen.. 1 By Mouth Q6h As Needed 6)  Norvasc 10 Mg Tabs (Amlodipine Besylate) .Marland Kitchen.. 1 By Mouth Once Daily 7)  Kombiglyze Xr 03-999 Mg Xr24h-Tab (Saxagliptin-Metformin) .Marland Kitchen.. 1 By Mouth Qd  Allergies (verified): 1)  Augmentin  Past History:  Past Medical History: Last updated: 05/05/2010 COPD/asthma      - PFT 08/01/09 FEV1 1.49(46%), FVC 0.99(42%), FEV1% 67, TLC 3.48(68%), DLCO 42%, no BD Depression Hyperlipidemia DVT, hx of Pulmonary embolism, hx of Hypertension OSA      - PSG 04/14/04 RDI 20 Hypoxemia      - 2 liters with exertion and sleep  Current Problems:  DERMATITIS, ALLERGIC (ICD-692.9) CEREBRAL ANEURYSM (ICD-437.3) DEGENERATIVE DISC  DISEASE, LUMBAR SPINE L3-4,L4-5 (ICD-722.52) ARTHRITIS (ICD-716.90)  Past Surgical History: Last updated: 08/01/2009 coiling and clamps from aneurysm  Family History: Last updated: 05/16/2009 Brother kidney transplant Family History of Asthma Family History Kidney disease Family History Lung cancer-father deceased due to lung ca Family History Diabetes 1st degree relative allergies-mother Leukemia-maternal grandmother  Social History: Last updated: 05/16/2009 Single Regular exercise-no Current Smoker1ppdx47yr. pt currently smoking 1/2ppd Divorced Alcohol use-no Drug use-no Pt lives alone.  Risk Factors: Alcohol Use: 0 (02/07/2009) Caffeine Use: 0 (02/07/2009) Exercise: no (02/07/2009)  Risk Factors: Smoking Status: current (04/30/2009) Packs/Day: 1.5 (04/30/2009)  Family History: Reviewed history from 05/16/2009 and no changes required. Brother kidney transplant Family History of Asthma Family History Kidney disease Family History Lung cancer-father deceased due to lung ca Family History Diabetes 1st degree relative allergies-mother Leukemia-maternal grandmother  Social History: Reviewed history from 05/16/2009 and no changes required. Single Regular exercise-no Current Smoker1ppdx52yr. pt currently smoking 1/2ppd Divorced Alcohol use-no Drug use-no Pt lives alone.  Review of Systems      See HPI  Physical Exam  General:  Well-developed,well-nourished,in no acute distress; alert,appropriate and cooperative throughout examination Msk:  no joint swelling, no joint warmth, and no redness over joints.   Extremities:  No clubbing, cyanosis, edema, or deformity noted with normal full range of motion of all joints.   Psych:  Oriented X3, depressed affect,  and flat affect.     Impression & Recommendations:  Problem # 1:  KNEE PAIN, BILATERAL (ICD-719.46) Pt does not qualify for scooter at this time---pt is able to do adls ---I encouraged her to use cane  and walker if needed and to con't to walk as needed Daughter states she sits and watches TV all day and wants to order everything on commercials--which is where the desire to get a scooter came from  Will get home health in to assess needs since pt is homebound and it is very difficult to get her to dr appointments--she needs PT Her updated medication list for this problem includes:    Ultram 50 Mg Tabs (Tramadol hcl) .Marland Kitchen... 1 by mouth q6h as needed  Orders: Home Health Referral (Home Health)  Problem # 2:  OBESITY, MORBID (ICD-278.01)  Orders: Home Health Referral (Home Health)  Problem # 3:  BACK PAIN, LUMBAR (ICD-724.2)  Her updated medication list for this problem includes:    Ultram 50 Mg Tabs (Tramadol hcl) .Marland Kitchen... 1 by mouth q6h as needed  Problem # 4:  ARTHRITIS (ICD-716.90)  Complete Medication List: 1)  Accuneb 0.63 Mg/60ml Nebu (Albuterol sulfate) .... One vial nebulized qid as needed 2)  Elite Nebulizer System Misc (Nebulizers) .... As directed 3)  Plavix 75 Mg Tabs (Clopidogrel bisulfate) .Marland Kitchen.. 1 by mouth once daily 4)  Crestor 20 Mg Tabs (Rosuvastatin calcium) .... Take one tablet each evening at bedtime. 5)  Ultram 50 Mg Tabs (Tramadol hcl) .Marland Kitchen.. 1 by mouth q6h as needed 6)  Norvasc 10 Mg Tabs (Amlodipine besylate) .Marland Kitchen.. 1 by mouth once daily 7)  Kombiglyze Xr 03-999 Mg Xr24h-tab (Saxagliptin-metformin) .Marland Kitchen.. 1 by mouth qd  Other Orders: Venipuncture (16109) TLB-Lipid Panel (80061-LIPID) TLB-BMP (Basic Metabolic Panel-BMET) (80048-METABOL) TLB-CBC Platelet - w/Differential (85025-CBCD) TLB-Hepatic/Liver Function Pnl (80076-HEPATIC) TLB-A1C / Hgb A1C (Glycohemoglobin) (83036-A1C) TLB-Microalbumin/Creat Ratio, Urine (82043-MALB) Specimen Handling (60454) Prescriptions: PLAVIX 75 MG TABS (CLOPIDOGREL BISULFATE) 1 by mouth once daily  #30 Tablet x 2   Entered and Authorized by:   Loreen Freud DO   Signed by:   Loreen Freud DO on 10/23/2010   Method used:   [facility]  [address] [id number]       [address]       [place], [place]  [address]       Ph: [phone] or [phone]       Fax: [phone]   RxID:   [id number]    Orders Added: 1)  Home Health Referral [facility]] 2)  Venipuncture [36415] 3)  TLB-Lipid Panel [80061-LIPID] 4)  TLB-BMP (Basic Metabolic Panel-BMET) [80048-METABOL] 5)  TLB-CBC Platelet - w/Differential [85025-CBCD] 6)  TLB-Hepatic/Liver Function Pnl [80076-HEPATIC] 7)  TLB-A1C / Hgb A1C (Glycohemoglobin) [83036-A1C] 8)  TLB-Microalbumin/Creat Ratio, Urine [82043-MALB] 9)  Specimen Handling [99000] 10)  Est. Patient Level III [25956]

## 2010-12-16 NOTE — Assessment & Plan Note (Signed)
Summary: rov/apc   Copy to:    Primary Provider/Referring Provider:  Loreen Freud DO  CC:  COPD.  No complaints today.Marland Kitchen  History of Present Illness: I met Ms. Lim follow up of her dyspnea with COPD, tobacco abuse, rhinitis, vocal cord dysfunction, morbid obesity, abnormal chest xray and sleep apnea.  She has been breathing better.  She still has a raspy voice, but this has improved also.  She gets winded easily with exertion.  She is not having much cough, wheeze, or sputum.  Her sinuses are okay.  She is not having much reflux.  She uses her nebulizer two times a day, and this helps.  Current Medications (verified): 1)  Symbicort 160-4.5 Mcg/act Aero (Budesonide-Formoterol Fumarate) .... Two Puffs Two Times A Day 2)  Spiriva Handihaler 18 Mcg Caps (Tiotropium Bromide Monohydrate) .... One Puff Once Daily 3)  Accuneb 0.63 Mg/69ml Nebu (Albuterol Sulfate) .... One Vial Nebulized Qid As Needed 4)  Proair Hfa 108 (90 Base) Mcg/act Aers (Albuterol Sulfate) .... Two Puffs Qid As Needed 5)  Flonase 50 Mcg/act Susp (Fluticasone Propionate) .... 2 Sprays Each Nostril Once Daily 6)  Elite Nebulizer System  Misc (Nebulizers) .... As Directed 7)  Plavix 75 Mg Tabs (Clopidogrel Bisulfate) .Marland Kitchen.. 1 By Mouth Once Daily 8)  Crestor 20 Mg Tabs (Rosuvastatin Calcium) .... Take One Tablet Each Evening At Bedtime. 9)  Ultram 50 Mg Tabs (Tramadol Hcl) .Marland Kitchen.. 1 By Mouth Q6h As Needed 10)  Norvasc 10 Mg Tabs (Amlodipine Besylate) .Marland Kitchen.. 1 By Mouth Once Daily 11)  Prilosec 20 Mg Cpdr (Omeprazole) .... One By Mouth Two Times A Day 12)  Rolling Walker With Seat .Marland Kitchen.. 719.46,  799.02, 278.01., 327.23,716.90, 496 13)  Kombiglyze Xr 03-999 Mg Xr24h-Tab (Saxagliptin-Metformin) .Marland Kitchen.. 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 coiling and clamps from aneurysm  Vital Signs:  Patient profile:   60 year old female Menstrual status:  postmenopausal Height:      65 inches (165.10 cm) Weight:      295 pounds (134.09 kg) BMI:     49.27 O2 Sat:      95 % on Room air Temp:     97.6 degrees F (36.44 degrees C) oral Pulse rate:   85 / minute BP sitting:   118 / 62  (right arm) Cuff size:   large  Vitals Entered By: Michel Bickers CMA (February 04, 2010 3:09 PM)  O2 Sat at Rest %:  95 O2 Flow:  Room air  Serial Vital Signs/Assessments:  Comments: 3:43 PM Ambulatory Pulse Oximetry  Resting; HR__78___    02 Sat___92__  Lap1 (185 feet)   HR__115___   02 Sat__95___ Lap2 (185 feet)   HR__122___   02 Sat_95___    Lap3 (185 feet)   HR__125___   02 Sat_96___  __x_Test Completed without Difficulty ___Test Stopped due to:  By: Michel Bickers CMA    Physical Exam  General:  obese.   Nose:  clear nasal discharge Mouth:  MP 4, enlarged tongue, no exudate  Neck:  no JVD, no thyromegaly Lungs:  diminished breath sounds, no wheezing or rales Heart:  regular rhythm, normal rate, and no murmurs.   Extremities:  no clubbing, cyanosis, edema, or deformity noted Cervical Nodes:  no significant adenopathy   Impression & Recommendations:  Problem # 1:  VOCAL CORD DISORDER (ICD-478.5) This has improved.  She is to continue on her sinus regimen, and anti-reflux therapy.  Problem # 2:  CHRONIC RHINITIS (ICD-472.0) This has improved with her current sinus regimen.  Problem # 3:  HYPOXEMIA (ICD-799.02) Her oxygen saturation was adequate on room air at rest, and with exertion.  Will set up overnight  oximetry on room air; this could be low related to her sleep apnea or underlaying pulmonary disease.  Problem # 4:  OBESITY, MORBID (ICD-278.01) Will enroll her in pulmonary rehab.  Hopefully this will get her started on a weight loss regimen.  I have explained to her that if she can decrease her weight much of her current health problems will likely drastically improve.  Problem # 5:  OBSTRUCTIVE SLEEP APNEA (ICD-327.23) She is reluctant to use CPAP because she says the machine is too loud.  As a result she can't hear what is going on outside her house.  She is concerned that, with the neighborhood she lives in, that something could happen if she is not vigilant at night.    Will arrange for overnight oximetry.  Depending on the results, I explained that she may need to reconsider therapy for her sleep apnea.  Explained to her that most of the CPAP machines now are fairly quiet.    Problem # 6:  COPD (ICD-496) She has improved with her current regimen.  Will arrange for pulmonary rehab referral.  Complete Medication List: 1)  Symbicort 160-4.5 Mcg/act Aero (Budesonide-formoterol fumarate) .... Two puffs two times a day 2)  Spiriva Handihaler 18 Mcg Caps (Tiotropium bromide monohydrate) .... One puff once daily 3)  Accuneb 0.63 Mg/20ml Nebu (Albuterol sulfate) .... One vial nebulized qid as needed 4)  Proair Hfa 108 (90 Base) Mcg/act Aers (Albuterol sulfate) .... Two puffs qid as needed 5)  Flonase 50 Mcg/act Susp (Fluticasone propionate) .... 2 sprays each nostril once daily 6)  Elite Nebulizer System Misc (Nebulizers) .... As directed 7)  Plavix 75 Mg Tabs (Clopidogrel bisulfate) .Marland Kitchen.. 1 by mouth once daily 8)  Crestor 20 Mg Tabs (Rosuvastatin calcium) .... Take one tablet each evening at bedtime. 9)  Ultram 50 Mg Tabs (Tramadol hcl) .Marland Kitchen.. 1 by mouth q6h as needed 10)  Norvasc 10 Mg Tabs (Amlodipine besylate) .Marland Kitchen.. 1 by mouth once daily 11)  Prilosec 20 Mg Cpdr (Omeprazole) .... One by mouth  two times a day 12)  Nurse, learning disability  .Marland Kitchen.. 719.46,  799.02, 278.01., 327.23,716.90, 496 13)  Kombiglyze Xr 03-999 Mg Xr24h-tab (Saxagliptin-metformin) .Marland Kitchen.. 1 by mouth qd  Other Orders: Est. Patient Level III (09811) Rehabilitation Referral (Rehab) DME Referral (DME)  Patient Instructions: 1)  Will arrange for pulmonary rehab referral 2)  Will arrange for oxygen test at night at home 3)  Follow up in 3 to 4 months

## 2010-12-16 NOTE — Letter (Signed)
Summary: Clearance for Surgery / [facility]  Clearance for Surgery / [facility]   Imported By: Lennie Odor 06/11/2010 13:49:29  _____________________________________________________________________  External Attachment:    Type:   Image     Comment:   External Document

## 2010-12-16 NOTE — Medication Information (Signed)
Summary: [facility]   Imported By: Lanelle Bal 06/13/2010 11:49:12  _____________________________________________________________________  External Attachment:    Type:   Image     Comment:   External Document

## 2010-12-16 NOTE — Assessment & Plan Note (Signed)
Summary: 3 month roa//lch   Vital Signs:  Patient profile:   60 year old female Menstrual status:  postmenopausal Weight:      291 pounds Pulse rate:   85 / minute Pulse rhythm:   regular BP sitting:   122 / 84  (left arm) Cuff size:   large  Vitals Entered By: Army Fossa CMA (May 02, 2010 10:40 AM) CC: Pt here for 3 month f/u, states her knees are hurting a lot.    History of Present Illness:  Type 1 diabetes mellitus follow-up      This is a 60 year old woman who presents with Type 2 diabetes mellitus follow-up.  The patient complains of weight gain, but denies polyuria, polydipsia, blurred vision, self managed hypoglycemia, hypoglycemia requiring help, weight loss, and numbness of extremities.  The patient denies the following symptoms: neuropathic pain, chest pain, vomiting, orthostatic symptoms, poor wound healing, intermittent claudication, vision loss, and foot ulcer.  Since the last visit the patient reports poor dietary compliance, compliance with medications, not exercising regularly, and not monitoring blood glucose.  Since the last visit, the patient reports having had eye care by an ophthalmologist and no foot care.    Hyperlipidemia follow-up      The patient also presents for Hyperlipidemia follow-up.  The patient complains of fatigue, but denies muscle aches, GI upset, abdominal pain, flushing, itching, constipation, and diarrhea.  Other symptoms include dypsnea.  The patient denies the following symptoms: chest pain/pressure, exercise intolerance, palpitations, syncope, and pedal edema.  Compliance with medications (by patient report) has been near 100%.  Dietary compliance has been poor.  The patient reports no exercise.    Hypertension follow-up      The patient also presents for Hypertension follow-up.  The patient denies lightheadedness, urinary frequency, headaches, edema, impotence, rash, and fatigue.  The patient denies the following associated symptoms: chest  pain, chest pressure, exercise intolerance, dyspnea, palpitations, syncope, leg edema, and pedal edema.  Compliance with medications (by patient report) has been near 100%.  The patient reports that dietary compliance has been poor.  The patient reports no exercise.  Adjunctive measures currently used by the patient include salt restriction.    Current Medications (verified): 1)  Symbicort 160-4.5 Mcg/act Aero (Budesonide-Formoterol Fumarate) .... Two Puffs Two Times A Day 2)  Spiriva Handihaler 18 Mcg Caps (Tiotropium Bromide Monohydrate) .... One Puff Once Daily 3)  Accuneb 0.63 Mg/19ml Nebu (Albuterol Sulfate) .... One Vial Nebulized Qid As Needed 4)  Elite Nebulizer System  Misc (Nebulizers) .... As Directed 5)  Plavix 75 Mg Tabs (Clopidogrel Bisulfate) .Marland Kitchen.. 1 By Mouth Once Daily 6)  Crestor 20 Mg Tabs (Rosuvastatin Calcium) .... Take One Tablet Each Evening At Bedtime. 7)  Ultram 50 Mg Tabs (Tramadol Hcl) .Marland Kitchen.. 1 By Mouth Q6h As Needed 8)  Norvasc 10 Mg Tabs (Amlodipine Besylate) .Marland Kitchen.. 1 By Mouth Once Daily 9)  Kombiglyze Xr 03-999 Mg Xr24h-Tab (Saxagliptin-Metformin) .Marland Kitchen.. 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 pt currently smoking 1/2ppd Divorced Alcohol use-no Drug use-no Pt lives alone.  Risk Factors: Alcohol Use: 0 (02/07/2009) Caffeine Use: 0 (02/07/2009) Exercise: no (02/07/2009)  Risk Factors: Smoking Status: current (04/30/2009) Packs/Day: 1.5 (04/30/2009)  Family History: Reviewed history from 05/16/2009 and no changes required. Brother kidney transplant Family History of Asthma Family History Kidney disease Family History Lung cancer-father deceased due to lung ca Family History Diabetes 1st degree relative allergies-mother Leukemia-maternal grandmother  Social History: Reviewed history from 05/16/2009 and no changes required. Single Regular exercise-no Current Smoker1ppdx76yr. pt currently smoking 1/2ppd Divorced Alcohol use-no Drug use-no Pt lives alone.  Review of Systems      See HPI  Physical Exam  General:  Well-developed,well-nourished,in no acute distress; alert,appropriate and cooperative throughout examination Lungs:  R wheezes and L wheezes.   Heart:  Normal rate and regular rhythm. S1 and S2 normal without gallop, murmur, click, rub or other extra sounds. Extremities:  trace left pedal edema and trace right pedal edema.    Diabetes Management Exam:    Foot Exam (with socks and/or shoes not present):       Sensory-Pinprick/Light touch:          Left medial foot (L-4): normal          Left dorsal foot (L-5): normal          Left lateral foot (S-1): normal          Right medial foot (L-4): normal          Right dorsal foot (L-5): normal          Right lateral foot (S-1): normal       Sensory-Monofilament:          Left foot: normal          Right foot: normal       Inspection:          Left foot: normal          Right foot: normal       Nails:          Left foot: normal          Right  foot: normal    Eye Exam:       Eye Exam done elsewhere          Date: 02/26/2010          Results: cataracts          Done by: Dr Lavone Orn   Impression & Recommendations:  Problem # 1:  HOT FLASHES (ICD-627.2)  Orders: TLB-BMP (Basic Metabolic Panel-BMET) (80048-METABOL) TLB-CBC Platelet - w/Differential (85025-CBCD) TLB-Hepatic/Liver Function Pnl (80076-HEPATIC) TLB-TSH (Thyroid Stimulating Hormone) (84443-TSH) TLB-A1C / Hgb A1C (Glycohemoglobin) (83036-A1C) TLB-Microalbumin/Creat Ratio, Urine (82043-MALB)  Discussed treatment options.   Problem # 2:  DIABETES MELLITUS, TYPE II (ICD-250.00)  Her updated medication list for this problem includes:    Kombiglyze Xr 03-999 Mg Xr24h-tab (Saxagliptin-metformin) .Marland Kitchen... 1 by mouth qd  Orders: Venipuncture (40102) TLB-Lipid Panel (80061-LIPID) Nutrition Referral (Nutrition) TLB-BMP (Basic Metabolic Panel-BMET) (80048-METABOL) TLB-CBC Platelet - w/Differential (85025-CBCD) TLB-Hepatic/Liver Function Pnl (80076-HEPATIC) TLB-TSH (Thyroid Stimulating Hormone) (84443-TSH) TLB-A1C / Hgb A1C (Glycohemoglobin) (83036-A1C) TLB-Microalbumin/Creat Ratio, Urine (82043-MALB)  Labs Reviewed: Creat: 0.6 (01/24/2010)     Last Eye  Exam: cataracts (02/26/2010) Reviewed HgBA1c results: 8.0 (01/24/2010)  7.8 (10/29/2009)  Problem # 3:  KNEE PAIN, BILATERAL (ICD-719.46)  Her updated medication list for this problem includes:    Ultram 50 Mg Tabs (Tramadol hcl) .Marland Kitchen... 1 by mouth q6h as needed  Discussed strengthening exercises, use of ice or heat, and medications.   Problem # 4:  TOBACCO USER (ICD-305.1)  Orders: TLB-BMP (Basic Metabolic Panel-BMET) (80048-METABOL) TLB-CBC Platelet - w/Differential (85025-CBCD) TLB-Hepatic/Liver Function Pnl (80076-HEPATIC) TLB-TSH (Thyroid Stimulating Hormone) (84443-TSH) TLB-A1C / Hgb A1C (Glycohemoglobin) (83036-A1C) TLB-Microalbumin/Creat Ratio, Urine (82043-MALB)  Problem # 5:  OBESITY, MORBID  (ICD-278.01)  Orders: Nutrition Referral (Nutrition) TLB-BMP (Basic Metabolic Panel-BMET) (80048-METABOL) TLB-CBC Platelet - w/Differential (85025-CBCD) TLB-Hepatic/Liver Function Pnl (80076-HEPATIC) TLB-TSH (Thyroid Stimulating Hormone) (84443-TSH) TLB-A1C / Hgb A1C (Glycohemoglobin) (83036-A1C) TLB-Microalbumin/Creat Ratio, Urine (82043-MALB)  Problem # 6:  OBSTRUCTIVE SLEEP APNEA (ICD-327.23)  Problem # 7:  HYPERTENSION (ICD-401.9)  Her updated medication list for this problem includes:    Norvasc 10 Mg Tabs (Amlodipine besylate) .Marland Kitchen... 1 by mouth once daily  Orders: Venipuncture (45409) TLB-Lipid Panel (80061-LIPID) TLB-BMP (Basic Metabolic Panel-BMET) (80048-METABOL) TLB-CBC Platelet - w/Differential (85025-CBCD) TLB-Hepatic/Liver Function Pnl (80076-HEPATIC) TLB-TSH (Thyroid Stimulating Hormone) (84443-TSH) TLB-A1C / Hgb A1C (Glycohemoglobin) (83036-A1C) TLB-Microalbumin/Creat Ratio, Urine (82043-MALB)  BP today: 122/84 Prior BP: 118/62 (02/04/2010)  Prior 10 Yr Risk Heart Disease: Not enough information (04/20/2007)  Labs Reviewed: K+: 3.5 (01/24/2010) Creat: : 0.6 (01/24/2010)   Chol: 92 (01/24/2010)   HDL: 44.40 (01/24/2010)   LDL: 32 (01/24/2010)   TG: 78.0 (01/24/2010)  Complete Medication List: 1)  Symbicort 160-4.5 Mcg/act Aero (Budesonide-formoterol fumarate) .... Two puffs two times a day 2)  Spiriva Handihaler 18 Mcg Caps (Tiotropium bromide monohydrate) .... One puff once daily 3)  Accuneb 0.63 Mg/61ml Nebu (Albuterol sulfate) .... One vial nebulized qid as needed 4)  Elite Nebulizer System Misc (Nebulizers) .... As directed 5)  Plavix 75 Mg Tabs (Clopidogrel bisulfate) .Marland Kitchen.. 1 by mouth once daily 6)  Crestor 20 Mg Tabs (Rosuvastatin calcium) .... Take one tablet each evening at bedtime. 7)  Ultram 50 Mg Tabs (Tramadol hcl) .Marland Kitchen.. 1 by mouth q6h as needed 8)  Norvasc 10 Mg Tabs (Amlodipine besylate) .Marland Kitchen.. 1 by mouth once daily 9)  Kombiglyze Xr 03-999 Mg  Xr24h-tab (Saxagliptin-metformin) .Marland Kitchen.. 1 by mouth qd  Patient Instructions: 1)  schedule physical 3 months 2)  sliding scale insulin --you may need this after your injection for your knee 3)  200-250  2 u  4)  251-300 4 u 5)  301-350 6 u  6)  351-400 8 u  7)  > 400 10 u call Dr.

## 2010-12-16 NOTE — Medication Information (Signed)
Summary: [facility]  [facility]   Imported By: Lanelle Bal 09/03/2010 16:14:51  _____________________________________________________________________  External Attachment:    Type:   Image     Comment:   External Document

## 2010-12-16 NOTE — Letter (Signed)
Summary: [facility]  [facility]   Imported By: Lanelle Bal 06/02/2010 11:54:19  _____________________________________________________________________  External Attachment:    Type:   Image     Comment:   External Document

## 2010-12-16 NOTE — Miscellaneous (Signed)
Summary: Report/[place]  Report/[place]   Imported By: Lester Lynwood 08/21/2010 10:11:29  _____________________________________________________________________  External Attachment:    Type:   Image     Comment:   External Document

## 2010-12-16 NOTE — Procedures (Signed)
Summary: SpO2 Sat  SpO2 Sat   Imported By: Sherian Rein 03/06/2010 11:04:57  _____________________________________________________________________  External Attachment:    Type:   Image     Comment:   External Document

## 2010-12-16 NOTE — Progress Notes (Signed)
Summary: Med too expensive 12/2,12/7  Phone Note Call from Patient Call back at Home Phone [phone]   Caller: Patient Summary of Call: Pt states that she is currently out of her plavix and would like to have some samples of med because she is unable to afford med. Advise pt that we do not have any samples of that med and will forward to dr lowne.............Marland KitchenFelecia Deloach CMA  October 17, 2010 11:18 AM   Follow-up for Phone Call        She needs to keep taking it--- If we have pt assistance form ( or her daughter can print it)  we can see if she can get it for free from company---can she get rx filled until we can figure that out? Dr Craige Cotta 's office may have some.    Follow-up by: Loreen Freud DO,  October 17, 2010 11:33 AM  Additional Follow-up for Phone Call Additional follow up Details #1::        I tried calling home # and it was disconnected. Tried alternate contact and Left message to call back Almeta Monas CMA Duncan Dull)  October 17, 2010 4:34 PM     Additional Follow-up for Phone Call Additional follow up Details #2::    Tried calling home # [phone] this number is disconnected. I will print off assistance form and mail to the patient. Follow-up by: Almeta Monas CMA Duncan Dull),  October 22, 2010 10:08 AM   Appended Document: Med too expensive 12/2,12/7 spk with Inetta Fermo they will pickup forms tomorrow.Marland KitchenMarland KitchenLetter not mailed

## 2010-12-16 NOTE — Medication Information (Signed)
Summary: [facility]  [facility]   Imported By: Lanelle Bal 08/22/2010 11:08:06  _____________________________________________________________________  External Attachment:    Type:   Image     Comment:   External Document

## 2010-12-16 NOTE — Letter (Signed)
Summary: CMN for Nebulizer/[facility]  CMN for Nebulizer/[facility]   Imported By: Lanelle Bal 05/30/2010 09:21:35  _____________________________________________________________________  External Attachment:    Type:   Image     Comment:   External Document

## 2010-12-16 NOTE — Medication Information (Signed)
Summary: [facility]  [facility]   Imported By: Lanelle Bal 04/24/2010 11:31:20  _____________________________________________________________________  External Attachment:    Type:   Image     Comment:   External Document

## 2010-12-16 NOTE — Progress Notes (Signed)
Summary: refill  Phone Note Refill Request Message from:  Fax from Pharmacy on [facility] [address] fax [phone]  amlodipine besylate 10mg   Initial call taken by: Barb Merino,  November 20, 2009 9:13 AM    Prescriptions: NORVASC 10 MG TABS (AMLODIPINE BESYLATE) 1 by mouth once daily  #30 Tablet x 2   Entered by:   Army Fossa CMA   Authorized by:   Loreen Freud DO   Signed by:   Army Fossa CMA on 11/20/2009   Method used:   Electronically to        [facility]  [address] [id number] ([facility])       [address]       [place], [place]  [address]       Ph: [phone] or [phone]       Fax: [phone]   RxID:   [id number]

## 2010-12-16 NOTE — Letter (Signed)
Summary: CMN for Knee Orthosis/[facility]  CMN for Knee Orthosis/[facility]   Imported By: Lanelle Bal 08/27/2010 14:13:22  _____________________________________________________________________  External Attachment:    Type:   Image     Comment:   External Document

## 2010-12-16 NOTE — Medication Information (Signed)
Summary: [facility]  [facility]   Imported By: Lanelle Bal 08/14/2010 11:37:02  _____________________________________________________________________  External Attachment:    Type:   Image     Comment:   External Document

## 2010-12-16 NOTE — Medication Information (Signed)
Summary: [facility] Supplies/[facility]  [facility]   Imported By: Lanelle Bal 10/17/2010 09:43:05  _____________________________________________________________________  External Attachment:    Type:   Image     Comment:   External Document

## 2010-12-16 NOTE — Letter (Signed)
Summary: [facility]  [facility]   Imported By: Lanelle Bal 06/25/2009 10:12:14  _____________________________________________________________________  External Attachment:    Type:   Image     Comment:   External Document

## 2010-12-16 NOTE — Letter (Signed)
Summary: CMN for Diabetes Supplies/[facility]  CMN for Diabetes Supplies/[facility]   Imported By: Lanelle Bal 06/23/2010 11:49:34  _____________________________________________________________________  External Attachment:    Type:   Image     Comment:   External Document

## 2010-12-16 NOTE — Medication Information (Signed)
Summary: ACEI/ARB Use in Patients with Diabetes Hypertension or Nephropat  ACEI/ARB Use in Patients with Diabetes Hypertension or Nephropathy/[facility]   Imported By: Lanelle Bal 09/23/2010 12:54:07  _____________________________________________________________________  External Attachment:    Type:   Image     Comment:   External Document

## 2010-12-16 NOTE — Letter (Signed)
Summary: Patient No Show/[facility]  Patient No Show/[facility]   Imported By: Lennie Odor 06/12/2010 12:33:26  _____________________________________________________________________  External Attachment:    Type:   Image     Comment:   External Document

## 2010-12-16 NOTE — Letter (Signed)
Summary: Wynona Luna OD  Wynona Luna OD   Imported By: Lanelle Bal 03/17/2010 08:21:12  _____________________________________________________________________  External Attachment:    Type:   Image     Comment:   External Document

## 2010-12-16 NOTE — Medication Information (Signed)
Summary: Diabetes Supplies/[facility]  Diabetes Supplies/[facility]   Imported By: Lanelle Bal 07/11/2010 09:42:52  _____________________________________________________________________  External Attachment:    Type:   Image     Comment:   External Document

## 2010-12-18 NOTE — Miscellaneous (Signed)
Summary: Care Plan/[facility]  Care Plan/[facility]   Imported By: Lanelle Bal 11/25/2010 13:19:08  _____________________________________________________________________  External Attachment:    Type:   Image     Comment:   External Document

## 2010-12-18 NOTE — Miscellaneous (Signed)
Summary: OT Orders/[facility]  OT Orders/[facility]   Imported By: Lanelle Bal 11/14/2010 15:06:05  _____________________________________________________________________  External Attachment:    Type:   Image     Comment:   External Document

## 2010-12-18 NOTE — Medication Information (Signed)
Summary: Diabetes Supplies/[facility]  Diabetes Supplies/[facility]   Imported By: Lanelle Bal 12/04/2010 12:15:47  _____________________________________________________________________  External Attachment:    Type:   Image     Comment:   External Document

## 2010-12-18 NOTE — Letter (Signed)
Summary: CMN for Back Support/[facility]  CMN for Back Support/[facility]   Imported By: Lanelle Bal 12/11/2010 12:49:20  _____________________________________________________________________  External Attachment:    Type:   Image     Comment:   External Document

## 2010-12-18 NOTE — Medication Information (Signed)
Summary: Diabetes Supplies/[facility]  [facility] Supplies/[facility]   Imported By: Lanelle Bal 12/04/2010 14:13:59  _____________________________________________________________________  External Attachment:    Type:   Image     Comment:   External Document

## 2010-12-23 ENCOUNTER — Encounter: Payer: Self-pay | Admitting: Family Medicine

## 2010-12-24 NOTE — Letter (Signed)
Summary: CMN for Diabetes Supplies/[facility]  CMN for Diabetes Supplies/[facility]   Imported By: Lanelle Bal 12/16/2010 10:24:18  _____________________________________________________________________  External Attachment:    Type:   Image     Comment:   External Document

## 2010-12-31 ENCOUNTER — Encounter: Payer: Self-pay | Admitting: Family Medicine

## 2011-01-07 NOTE — Medication Information (Signed)
Summary: Order for Diabetic Supplies  Order for Diabetic Supplies   Imported By: Maryln Gottron 12/29/2010 10:39:51  _____________________________________________________________________  External Attachment:    Type:   Image     Comment:   External Document

## 2011-01-08 ENCOUNTER — Telehealth: Payer: Self-pay | Admitting: Pulmonary Disease

## 2011-01-09 ENCOUNTER — Encounter: Payer: Self-pay | Admitting: Pulmonary Disease

## 2011-01-13 NOTE — Medication Information (Addendum)
Summary: Request for Step Therapy-[facility]  [facility] for [facility]   Imported By: Maryln Gottron 01/09/2011 14:12:48  _____________________________________________________________________  External Attachment:    Type:   Image     Comment:   External Document  Appended Document: Request for Step Therapy-[facility] they need to tell us what the step therapy is  Appended Document: Request for Step Therapy-[facility] alternatives printed

## 2011-01-13 NOTE — Progress Notes (Addendum)
Summary: Oxygen pickup  Phone Note Call from Patient   Summary of Call: Patient left message on triage requesting call back.  I called her for more info and she would like her O2 picked up stating that she has not used it in months. Patient was not able to tell me the name of the company, but will call back with that info.   ?Does this patient need O2 studies prior? Please advise.  Initial call taken by: Lucious Groves CMA,  January 08, 2011 10:12 AM  Follow-up for Phone Call        Patient called back noting that is it [facility] # [phone]. Lucious Groves CMA  January 08, 2011 10:38 AM   Additional Follow-up for Phone Call Additional follow up Details #1::        pt is a pt of pulmonary. Loreen Freud DO,  January 08, 2011 10:39 AM  Will forward to pulmonary. Lucious Groves CMA  January 08, 2011 11:22 AM     Additional Follow-up for Phone Call Additional follow up Details #2::    She was last seen June 2011.  At that time she was noted to have SpO2 87% on room air at rest.  She was to have follow up in 4 months, but did not follow up.  Will need to make ROV appt with pulmonary first to assess further her need for supplemental oxygen.    Will forward to my nurse to schedule ROV. Follow-up by: Coralyn Helling MD,  January 09, 2011 3:41 PM   Appended Document: Oxygen pickup atc pt to set ov to re eval pt but the home phone number is disconnected and the work number listed is not correct. Looked in idx and their is no alternative number for pt to be reached  Appended Document: Oxygen pickup Can we mail letter informing her to contact office to schedule ROV.  Appended Document: Oxygen pickup Mailed letter out to pt home for her to give our office a call  Appended Document: Oxygen pickup received a message in triage inbox stating pt received letter from our office.  pt has recently moved to the [place] area and has established care there.  no longer needing our services.  see  phone note from today for complete details.

## 2011-01-13 NOTE — Letter (Signed)
SummaryScience writer Pulmonary Care Appointment Letter  [facility]  [address]   [place], [place] [address]   Phone: [phone]  Fax: [phone]    01/09/2011 MRN: [id number]  [facility] [address] [place], [place]  [address]  Dear Ms. Voorhis,   Our office is attempting to contact you about an appointment.  Please call our office at [phone].  Our registration staff is prepared to assist you with any questions you may have.    Thank you,   Nature conservation officer Pulmonary Division

## 2011-01-16 ENCOUNTER — Encounter: Payer: Self-pay | Admitting: Family Medicine

## 2011-01-22 ENCOUNTER — Telehealth: Payer: Self-pay | Admitting: Pulmonary Disease

## 2011-01-23 ENCOUNTER — Telehealth: Payer: Self-pay | Admitting: Pulmonary Disease

## 2011-01-27 NOTE — Progress Notes (Signed)
Summary: FYI pt has relocated and will not be returning to our office  Phone Note Call from Patient   Caller: Patient Call For: Karen Perez Summary of Call: Patient phoned stated that she received a letter stating that our office was trying to reach her for an appointment and she has moved to [facility] [place] [facility] and will not be coming back to our practice. She can be reached at [phone]. Patient has established with a physician there.  Initial call taken by: Vedia Coffer,  January 22, 2011 3:01 PM  Follow-up for Phone Call        Will forward message to VS as an FYI.  Aundra Millet Reynolds LPN  January 22, 980 3:49 PM

## 2011-01-27 NOTE — Medication Information (Signed)
Summary: Order for Diabetic Supplies  Order for Diabetic Supplies   Imported By: Maryln Gottron 01/22/2011 10:36:31  _____________________________________________________________________  External Attachment:    Type:   Image     Comment:   External Document

## 2011-01-28 ENCOUNTER — Telehealth (INDEPENDENT_AMBULATORY_CARE_PROVIDER_SITE_OTHER): Payer: Self-pay | Admitting: *Deleted

## 2011-01-30 ENCOUNTER — Telehealth (INDEPENDENT_AMBULATORY_CARE_PROVIDER_SITE_OTHER): Payer: Self-pay | Admitting: *Deleted

## 2011-01-31 LAB — GLUCOSE, CAPILLARY
Glucose-Capillary: 143 mg/dL — ABNORMAL HIGH (ref 70–99)
Glucose-Capillary: 162 mg/dL — ABNORMAL HIGH (ref 70–99)

## 2011-02-01 LAB — GLUCOSE, CAPILLARY: Glucose-Capillary: 123 mg/dL — ABNORMAL HIGH (ref 70–99)

## 2011-02-03 NOTE — Progress Notes (Signed)
Summary: RX CHANGE  Phone Note Call from Patient   Summary of Call: Fax from pharmacy, Rx [employer]ze rejected by ins company....will change to  Janumet 50-1000 1 by mouth two times a day #180 with 2 refills called to the pharmacist, will update on med list.... Almeta Monas CMA Duncan Dull)  January 30, 2011 11:16 AM  Initial call taken by: Almeta Monas CMA Duncan Dull),  January 30, 2011 11:16 AM    New/Updated Medications: JANUMET 50-1000 MG TABS (SITAGLIPTIN-METFORMIN HCL) 1 by mouth two times a day Prescriptions: JANUMET 50-1000 MG TABS (SITAGLIPTIN-METFORMIN HCL) 1 by mouth two times a day  #180 x 2   Entered by:   Almeta Monas CMA (AAMA)   Authorized by:   Loreen Freud DO   Signed by:   Almeta Monas CMA (AAMA) on 01/30/2011   Method used:   Telephoned to ...       [facility]  [address] [id number]       [address]       [place], [place]  [address]       Ph: [phone] or [phone]       Fax: [phone]   RxID:   [id number]

## 2011-02-03 NOTE — Progress Notes (Signed)
Summary: oxygen>re-evaluate with new MD  Phone Note Call from Patient Call back at [phone]   Caller: Patient Call For: Zella Dewan Summary of Call: Pt wants to have her O2 picked up. Initial call taken by: Darletta Moll,  January 23, 2011 4:04 PM  Follow-up for Phone Call        [facility]Reynaldo Minium CMA  January 23, 2011 5:06 PM   LMOMTCB Vernie Murders  January 26, 2011 11:56 AM   Additional Follow-up for Phone Call Additional follow up Details #1::        Spoke with pt,  She states that she has not used o2 in a long time.  She wants tanks picked up- DME is DRS.  She states that she "thinks" Dr. Craige Cotta is the one who started her on o2.   FYI- pt seeing doc in [place] now and is no longer going to followup here per last phone note.  Additional Follow-up by: Vernie Murders,  January 26, 2011 3:14 PM    Additional Follow-up for Phone Call Additional follow up Details #2::    Please advise Ms. Firebaugh that she would need to have oxygen need re-evaluated by new physicians in [place]. Follow-up by: Coralyn Helling MD,  January 27, 2011 11:07 AM  Additional Follow-up for Phone Call Additional follow up Details #3:: Details for Additional Follow-up Action Taken: pt advised to have new Md re-evaluate for Oxygen.Carron Curie CMA  January 27, 2011 11:09 AM

## 2011-02-03 NOTE — Progress Notes (Signed)
Summary: refill  Phone Note Refill Request Message from:  Fax from Pharmacy on January 28, 2011 11:05 AM  Refills Requested: Medication #1:  CRESTOR 20 MG TABS Take one tablet each evening at bedtime. [facility] [address] [place] [facility] fax [phone]  Initial call taken by: Okey Regal [place],  January 28, 2011 11:05 AM    Prescriptions: CRESTOR 20 MG TABS (ROSUVASTATIN CALCIUM) Take one tablet each evening at bedtime.  #30 Tablet x 2   Entered by:   Almeta Monas CMA (AAMA)   Authorized by:   Loreen Freud DO   Signed by:   Almeta Monas CMA (AAMA) on 01/28/2011   Method used:   [facility]ed to ...       [facility] [phone][facility] ([facility]ail)       [address]       [place], [place]  [address]       Ph: [phone]       Fax: [phone]   RxID:   [id number]

## 2011-03-31 NOTE — Assessment & Plan Note (Signed)
[facility]                               LIPID CLINIC NOTE   URIJAH, Perez                      MRN:          [id number]  DATE:01/02/2008                            DOB:          01/16/1951    Ms. [name] is seen in lipid clinic for further evaluation and  medication titration associated with her hyperlipidemia in the setting  of high-risk features.  She has been feeling and doing well, overall.  She has had no trouble, so far, with her various medications.  She is  currently taking Pravachol 40 mg daily at bedtime.  She is tolerating it  well.  She did stop her Vytorin previously, due to cough.   CURRENT MEDICATIONS INCLUDE:  1. Warfarin, followed by Pura Spice.  2. Clonidine 0.1 mg two tablets at bedtime.  3. Advair 250/50 one inhalation twice daily.  4. Albuterol periodically.  5. Pravachol.  6. Vitamin E 400 international units daily.   SOCIAL HISTORY:  Thirty pack-year history of smoking.  Recently down to  a half pack per day, using nicotine gum periodically.   DIET REVIEW:  Appropriate.   PHYSICAL EXAM:  Weight is 281, blood pressure is 156/92, heart rate is  not obtained, respirations are 18.   LABORATORY DATA:  Labs on December 14, 2007, revealed total cholesterol  159, triglyceride 103, HDL 46, LDL 92.  LFTs are within normal limits.   ASSESSMENT:  The patient has need for secondary prevention with multiple  risk factors and goal LDL of 70.  She will start using her Advair in a  scheduled fashion twice daily to prevent symptoms.  She will discontinue  her Pravachol and start simvastatin 80 mg daily at bedtime.  This will  be called to [facility] on [address].  She will look at [employer] and  purchasing a stationary bicycle.  She will come back in six to seven  weeks with labs at [facility]  office and follow up in lipid clinic on April 9 at 2:30.  The patient  was seen with Tawni Levy, PharmD resident.      Shelby Dubin, PharmD, BCPS, CPP  Electronically Signed      Rollene Rotunda, MD, [facility]  Electronically Signed   MP/MedQ  DD: 01/05/2008  DT: 01/06/2008  Job #: [id number]

## 2011-04-03 NOTE — Procedures (Signed)
[name], [name] NO.:  [id number]   MEDICAL RECORD NO.:  [id number]          PATIENT TYPE:  OUT   LOCATION:  SLEEP CENTER                 FACILITY:  [facility]   PHYSICIAN:  Marcelyn Bruins, M.D. [facility] DATE OF BIRTH:  1951-08-03   DATE OF STUDY:  04/14/2005                              NOCTURNAL POLYSOMNOGRAM   REFERRING PHYSICIAN:  Loreen Freud, MD   INDICATION FOR THE STUDY:  Hypersomnia with sleep apnea. Epworth score: 20.   SLEEP ARCHITECTURE:  The patient had a total sleep time of 307 minutes with  decreased REM and slow wave sleep. Sleep onset latency was very prolonged at  69 minutes and REM latency was prolonged as well.   IMPRESSION:  1.  Moderate obstructive sleep apnea/hypopnea syndrome with a respiratory      disturbance index of 20 events per hour and O2 desaturation as low as      83%. Events were not positional. Treatment for this degree of sleep      apnea may include weight loss, upper airway surgery, oral appliance and      CPAP. Clinical correlation is suggested.  2.  Loud snoring noted throughout the study.  3.  No clinically significant cardiac arrhythmias.     ______________________________  Suzzette Righter    KC/MEDQ  D:  04/27/2005 15:52:01  T:  04/27/2005 17:33:30  Job:  [id number]

## 2011-04-03 NOTE — Op Note (Signed)
NAMEARMELIA, Karen Perez               ACCOUNT NO.:  [id number]   MEDICAL RECORD NO.:  [id number]          PATIENT TYPE:  AMB   LOCATION:  SDC                           FACILITY:  [facility]   PHYSICIAN:  Crist Fat. Rivard, M.D. DATE OF BIRTH:  1951/01/16   DATE OF PROCEDURE:  11/02/2005  DATE OF DISCHARGE:                                 OPERATIVE REPORT   PREOPERATIVE DIAGNOSIS:  Post menopausal bleeding with thickened endometrium  on ultrasound and cervical stenosis.   POSTOPERATIVE DIAGNOSIS:  Post menopausal bleeding with thickened  endometrium on ultrasound and cervical stenosis.   PROCEDURE:  Failed attempt at dilatation of cervix.   SURGEON:  Dr. Estanislado Pandy, no assistant.   ESTIMATED BLOOD LOSS:  Minimal.   PROCEDURE:  After being informed of the planned procedure with possible  complications including bleeding, infection and injury to other organs,  informed consent is obtained. The patient is taken to OR #7 and after  receiving a nebulizer treatment, was given IV sedation. She is placed in  lithotomy position, prepped and draped in a sterile fashion and her bladder  is emptied with an in-and-out Foley catheter. A long weighted speculum is  inserted. Anterior lip of cervix is finally visualized and grasped with a  tenaculum forceps and the cervix was prepped again with Betadine. We are  unable to sound the uterus due to cervical stenosis and using normal Hegar  dilator, we are unable to perform dilatation. Lacrimal dilators were then  requested and despite multiple forceful attempts it is impossible to dilate  the cervix. A small laceration where the tenaculum forceps was placed is now  bleeding and a figure-of-eight stitch of 2-0 chromic is placed and it is  hemostatic. Instruments were then removed. Instruments and sponge count is  complete x2. Estimated blood loss is minimal and the procedure is well  tolerated by the patient who is taken to recovery room and discharged home  in a  well and stable condition. We will make plan to obtain pelvic MRI and  consultation with GYN oncologist for this patient.      Crist Fat Rivard, M.D.  Electronically Signed     SAR/MEDQ  D:  11/02/2005  T:  11/03/2005  Job:  [id number]

## 2011-08-18 ENCOUNTER — Ambulatory Visit: Payer: Self-pay | Admitting: Ophthalmology

## 2011-08-18 DIAGNOSIS — I119 Hypertensive heart disease without heart failure: Secondary | ICD-10-CM

## 2011-09-01 ENCOUNTER — Ambulatory Visit: Payer: Self-pay | Admitting: Ophthalmology

## 2011-11-17 HISTORY — PX: CEREBRAL ANEURYSM REPAIR: SHX164

## 2015-06-13 ENCOUNTER — Encounter: Payer: Self-pay | Admitting: Gastroenterology

## 2016-06-10 ENCOUNTER — Ambulatory Visit: Payer: Self-pay | Admitting: Family Medicine

## 2016-07-30 DIAGNOSIS — E785 Hyperlipidemia, unspecified: Secondary | ICD-10-CM | POA: Diagnosis not present

## 2016-07-30 DIAGNOSIS — E1165 Type 2 diabetes mellitus with hyperglycemia: Secondary | ICD-10-CM | POA: Diagnosis not present

## 2016-07-30 DIAGNOSIS — R531 Weakness: Secondary | ICD-10-CM | POA: Diagnosis not present

## 2016-07-30 DIAGNOSIS — J309 Allergic rhinitis, unspecified: Secondary | ICD-10-CM | POA: Diagnosis not present

## 2016-07-30 DIAGNOSIS — I6523 Occlusion and stenosis of bilateral carotid arteries: Secondary | ICD-10-CM | POA: Diagnosis not present

## 2016-07-30 DIAGNOSIS — I10 Essential (primary) hypertension: Secondary | ICD-10-CM | POA: Diagnosis not present

## 2016-07-30 DIAGNOSIS — M545 Low back pain: Secondary | ICD-10-CM | POA: Diagnosis not present

## 2016-08-19 DIAGNOSIS — M170 Bilateral primary osteoarthritis of knee: Secondary | ICD-10-CM | POA: Diagnosis not present

## 2016-08-20 DIAGNOSIS — E785 Hyperlipidemia, unspecified: Secondary | ICD-10-CM | POA: Diagnosis not present

## 2016-08-20 DIAGNOSIS — J449 Chronic obstructive pulmonary disease, unspecified: Secondary | ICD-10-CM | POA: Diagnosis not present

## 2016-08-20 DIAGNOSIS — R0600 Dyspnea, unspecified: Secondary | ICD-10-CM | POA: Diagnosis not present

## 2016-08-20 DIAGNOSIS — F17210 Nicotine dependence, cigarettes, uncomplicated: Secondary | ICD-10-CM | POA: Diagnosis not present

## 2016-08-20 DIAGNOSIS — G4733 Obstructive sleep apnea (adult) (pediatric): Secondary | ICD-10-CM | POA: Diagnosis not present

## 2016-08-20 DIAGNOSIS — E1165 Type 2 diabetes mellitus with hyperglycemia: Secondary | ICD-10-CM | POA: Diagnosis not present

## 2016-08-20 DIAGNOSIS — I6523 Occlusion and stenosis of bilateral carotid arteries: Secondary | ICD-10-CM | POA: Diagnosis not present

## 2016-08-20 DIAGNOSIS — Z23 Encounter for immunization: Secondary | ICD-10-CM | POA: Diagnosis not present

## 2016-09-02 DIAGNOSIS — I6523 Occlusion and stenosis of bilateral carotid arteries: Secondary | ICD-10-CM | POA: Diagnosis not present

## 2016-09-09 ENCOUNTER — Other Ambulatory Visit: Payer: Self-pay | Admitting: Internal Medicine

## 2016-09-09 ENCOUNTER — Ambulatory Visit
Admission: RE | Admit: 2016-09-09 | Discharge: 2016-09-09 | Disposition: A | Discharge status: Home / Self Care | Payer: Commercial Managed Care - HMO | Source: Ambulatory Visit | Attending: Internal Medicine | Admitting: Internal Medicine

## 2016-09-09 DIAGNOSIS — R52 Pain, unspecified: Secondary | ICD-10-CM

## 2016-09-09 DIAGNOSIS — R059 Cough, unspecified: Secondary | ICD-10-CM

## 2016-09-09 DIAGNOSIS — R05 Cough: Secondary | ICD-10-CM

## 2016-09-09 DIAGNOSIS — R079 Chest pain, unspecified: Secondary | ICD-10-CM | POA: Insufficient documentation

## 2016-09-09 DIAGNOSIS — R0602 Shortness of breath: Secondary | ICD-10-CM | POA: Diagnosis not present

## 2016-09-13 ENCOUNTER — Encounter: Payer: Self-pay | Admitting: Emergency Medicine

## 2016-09-13 ENCOUNTER — Emergency Department: Payer: Commercial Managed Care - HMO

## 2016-09-13 ENCOUNTER — Emergency Department
Admission: EM | Admit: 2016-09-13 | Discharge: 2016-09-13 | Disposition: A | Discharge status: Home / Self Care | Payer: Commercial Managed Care - HMO | Attending: Emergency Medicine | Admitting: Emergency Medicine

## 2016-09-13 DIAGNOSIS — M1712 Unilateral primary osteoarthritis, left knee: Secondary | ICD-10-CM | POA: Diagnosis not present

## 2016-09-13 DIAGNOSIS — W06XXXA Fall from bed, initial encounter: Secondary | ICD-10-CM | POA: Diagnosis not present

## 2016-09-13 DIAGNOSIS — Y929 Unspecified place or not applicable: Secondary | ICD-10-CM | POA: Insufficient documentation

## 2016-09-13 DIAGNOSIS — M25562 Pain in left knee: Secondary | ICD-10-CM | POA: Diagnosis present

## 2016-09-13 DIAGNOSIS — J449 Chronic obstructive pulmonary disease, unspecified: Secondary | ICD-10-CM | POA: Diagnosis not present

## 2016-09-13 DIAGNOSIS — Y9389 Activity, other specified: Secondary | ICD-10-CM | POA: Insufficient documentation

## 2016-09-13 DIAGNOSIS — M545 Low back pain: Secondary | ICD-10-CM | POA: Insufficient documentation

## 2016-09-13 DIAGNOSIS — I10 Essential (primary) hypertension: Secondary | ICD-10-CM | POA: Insufficient documentation

## 2016-09-13 DIAGNOSIS — M25561 Pain in right knee: Secondary | ICD-10-CM | POA: Diagnosis not present

## 2016-09-13 DIAGNOSIS — Z79899 Other long term (current) drug therapy: Secondary | ICD-10-CM | POA: Diagnosis not present

## 2016-09-13 DIAGNOSIS — M170 Bilateral primary osteoarthritis of knee: Secondary | ICD-10-CM | POA: Insufficient documentation

## 2016-09-13 DIAGNOSIS — E119 Type 2 diabetes mellitus without complications: Secondary | ICD-10-CM | POA: Diagnosis not present

## 2016-09-13 DIAGNOSIS — J45909 Unspecified asthma, uncomplicated: Secondary | ICD-10-CM | POA: Insufficient documentation

## 2016-09-13 DIAGNOSIS — W19XXXA Unspecified fall, initial encounter: Secondary | ICD-10-CM

## 2016-09-13 DIAGNOSIS — M1711 Unilateral primary osteoarthritis, right knee: Secondary | ICD-10-CM | POA: Diagnosis not present

## 2016-09-13 DIAGNOSIS — S3993XA Unspecified injury of pelvis, initial encounter: Secondary | ICD-10-CM | POA: Diagnosis not present

## 2016-09-13 DIAGNOSIS — M25462 Effusion, left knee: Secondary | ICD-10-CM | POA: Diagnosis not present

## 2016-09-13 DIAGNOSIS — F17200 Nicotine dependence, unspecified, uncomplicated: Secondary | ICD-10-CM | POA: Insufficient documentation

## 2016-09-13 DIAGNOSIS — Y999 Unspecified external cause status: Secondary | ICD-10-CM | POA: Insufficient documentation

## 2016-09-13 HISTORY — DX: Chronic obstructive pulmonary disease, unspecified: J44.9

## 2016-09-13 HISTORY — DX: Cerebral infarction, unspecified: I63.9

## 2016-09-13 HISTORY — DX: Essential (primary) hypertension: I10

## 2016-09-13 HISTORY — DX: Cerebral aneurysm, nonruptured: I67.1

## 2016-09-13 HISTORY — DX: Unspecified asthma, uncomplicated: J45.909

## 2016-09-13 HISTORY — DX: Type 2 diabetes mellitus without complications: E11.9

## 2016-09-13 HISTORY — DX: Unspecified osteoarthritis, unspecified site: M19.90

## 2016-09-13 MED ORDER — PREDNISONE 10 MG PO TABS: 10.0000 mg | ORAL_TABLET | Freq: Every day | ORAL | 0 refills | Status: DC

## 2016-09-13 NOTE — ED Provider Notes (Signed)
[facility] Emergency Department Provider Note  ____________________________________________  Time seen: Approximately 9:11 PM  I have reviewed the triage vital signs and the nursing notes.   HISTORY  Chief Complaint Fall    HPI Karen Perez is a 65 y.o. female who presents emergency department complaining of low back pain and bilateral knee pain status post fall. Patient states that she was in bed trying to reach for her phone when she fell landing on her buttocks. This occurred 2 days prior. Patient has had lower back pain and bilateral knee pain. Patient states that she has chronic pain in these regions all the time but that it is increased since the accident. Patient states that occasionally her left knee feels like it will give out on her. She denies any numbness or tingling distally, sinuses, paresthesia, bowel or bladder dysfunction. She did not hit her head or lose consciousness. This time. She has not tried any medications at home for pain.   Past Medical History:  Diagnosis Date  . Arthritis   . Asthma   . Brain aneurysm   . COPD (chronic obstructive pulmonary disease) (HCC)   . Diabetes mellitus without complication (HCC)   . Hypertension   . Stroke [facility]) 2003    Patient Active Problem List   Diagnosis Date Noted  . HOT FLASHES 05/02/2010  . DIABETES MELLITUS, TYPE II 01/24/2010  . SKIN TAG 10/29/2009  . KNEE PAIN, BILATERAL 10/04/2009  . CHRONIC RHINITIS 08/01/2009  . VOCAL CORD DISORDER 08/01/2009  . GERD 08/01/2009  . SKIN RASH 06/28/2009  . HYPOXEMIA 05/21/2009  . Nonspecific (abnormal) findings on radiological and other examination of body structure 05/16/2009  . ABNORMAL CHEST XRAY 05/16/2009  . OBESITY, MORBID 04/30/2009  . TOBACCO USER 04/30/2009  . TINEA CORPORIS 04/19/2009  . HEMOCCULT POSITIVE STOOL 02/07/2009  . LEG CRAMPS 12/24/2008  . DERMATITIS, ALLERGIC 06/28/2008  . BACK PAIN, LUMBAR 05/04/2008  . OBSTRUCTIVE  SLEEP APNEA 10/17/2007  . HYPERTENSION 04/20/2007  . HYPERLIPIDEMIA 03/21/2007  . DEPRESSION 03/21/2007  . CEREBRAL ANEURYSM 03/21/2007  . COPD 03/21/2007  . ARTHRITIS 03/21/2007  . DVT, HX OF 03/21/2007    History reviewed. No pertinent surgical history.  Prior to Admission medications   Medication Sig Start Date End Date Taking? Authorizing Provider  predniSONE (DELTASONE) 10 MG tablet Take 1 tablet (10 mg total) by mouth daily. 09/13/16   Delorise Royals Cuthriell, PA-C    Allergies Amoxicillin-pot clavulanate and Neosporin [neomycin-bacitracin zn-polymyx]  History reviewed. No pertinent family history.  Social History Social History  Substance Use Topics  . Smoking status: Current Every Day Smoker  . Smokeless tobacco: Never Used  . Alcohol use No     Review of Systems  Constitutional: No fever/chills Cardiovascular: no chest pain. Respiratory: no cough. No SOB. Musculoskeletal: Positive for lower back pain and bilateral knee pain Skin: Negative for rash, abrasions, lacerations, ecchymosis. Neurological: Negative for headaches, focal weakness or numbness. 10-point ROS otherwise negative.  ____________________________________________   PHYSICAL EXAM:  VITAL SIGNS: ED Triage Vitals  Enc Vitals Group     BP 09/13/16 1925 114/70     Pulse Rate 09/13/16 1925 89     Resp --      Temp 09/13/16 1925 97.7 F (36.5 C)     Temp Source 09/13/16 1925 Oral     SpO2 09/13/16 1925 92 %     Weight 09/13/16 1925 264 lb (119.7 kg)     Height 09/13/16 1925 5\' 6"  (1.676  m)     Head Circumference --      Peak Flow --      Pain Score 09/13/16 1947 10     Pain Loc --      Pain Edu? --      Excl. in GC? --      Constitutional: Alert and oriented. Well appearing and in no acute distress. Eyes: Conjunctivae are normal. PERRL. EOMI. Head: Atraumatic. Cardiovascular: Normal rate, regular rhythm. Normal S1 and S2.  Good peripheral circulation. Respiratory: Normal respiratory  effort without tachypnea or retractions. Lungs CTAB. Good air entry to the bases with no decreased or absent breath sounds. Musculoskeletal: No deformities to spine upon inspection. Full range of motion. Patient is tender to palpation in the lower lumbar/SI joints. No palpable abnormality. No crepitus. No tenderness to palpation over bilateral sciatic notches. Dorsalis pedis pulse and sensation intact bilateral lower extremities. No visible deformities to bilateral knees. Full range of motion bilaterally. Varus, valgus, Lachman's, McMurray's is negative bilaterally. Patient is diffusely tender to palpation over the anterior aspects of bilateral knees. Patient is also tender to palpation over the medial aspect of the left knee. No palpable abnormality bilaterally.  Neurologic:  Normal speech and language. No gross focal neurologic deficits are appreciated.  Skin:  Skin is warm, dry and intact. No rash noted. Psychiatric: Mood and affect are normal. Speech and behavior are normal. Patient exhibits appropriate insight and judgement.   ____________________________________________   LABS (all labs ordered are listed, but only abnormal results are displayed)  Labs Reviewed - No data to display ____________________________________________  EKG   ____________________________________________  RADIOLOGY Festus BarrenI, Jonathan D Cuthriell, personally viewed and evaluated these images (plain radiographs) as part of my medical decision making, as well as reviewing the written report by the radiologist.  Dg Pelvis 1-2 Views  Result Date: 09/13/2016 CLINICAL DATA:  Larey SeatFell out of bed. EXAM: PELVIS - 1-2 VIEW COMPARISON:  None. FINDINGS: There is no evidence of pelvic fracture or diastasis. No pelvic bone lesions are seen. Hip and sacroiliac joints appear normal. IMPRESSION: Normal pelvis. Electronically Signed   By: Lupita RaiderJames  Green Jr, M.D.   On: 09/13/2016 20:59   Dg Knee Complete 4 Views Left  Result Date:  09/13/2016 CLINICAL DATA:  Larey SeatFell out of bed. EXAM: LEFT KNEE - COMPLETE 4+ VIEW COMPARISON:  None. FINDINGS: No fracture or dislocation is noted. Mild suprapatellar joint effusion is noted. Severe narrowing of medial joint space is noted with osteophyte formation. Osteophyte formation is noted laterally is well. IMPRESSION: Severe degenerative joint disease is noted medially. Mild suprapatellar joint effusion is noted. No fracture or dislocation is noted. Electronically Signed   By: Lupita RaiderJames  Green Jr, M.D.   On: 09/13/2016 21:03   Dg Knee Complete 4 Views Right  Result Date: 09/13/2016 CLINICAL DATA:  Right knee pain after fall out of bed. EXAM: RIGHT KNEE - COMPLETE 4+ VIEW COMPARISON:  None. FINDINGS: No evidence of fracture, dislocation, or joint effusion. Moderate narrowing is noted medially and laterally with osteophyte formation. Soft tissues are unremarkable. IMPRESSION: Moderate degenerative joint disease. No acute abnormality seen in the right knee. Electronically Signed   By: Lupita RaiderJames  Green Jr, M.D.   On: 09/13/2016 21:01    ____________________________________________    PROCEDURES  Procedure(s) performed:    Procedures    Medications - No data to display   ____________________________________________   INITIAL IMPRESSION / ASSESSMENT AND PLAN / ED COURSE  Pertinent labs & imaging results that were available during  my care of the patient were reviewed by me and considered in my medical decision making (see chart for details).  Review of the [place] CSRS was performed in accordance of the [employer] prior to dispensing any controlled drugs.  Clinical Course    Patient's diagnosis is consistent with Fall resulting in exacerbation of underlying osteoarthritis. No fractures on x-ray. Patient's exam is reassuring with her being neurologically and vascular intact distally. No concerning symptoms for further imaging. Patient does have a history of diabetes and as such will not be placed on  NSAID therapy. She will be given a course of steroids for inflammation control. Should symptoms persist or worsen, the patient will follow-up with orthopedics.. Patient is given ED precautions to return to the ED for any worsening or new symptoms.     ____________________________________________  FINAL CLINICAL IMPRESSION(S) / ED DIAGNOSES  Final diagnoses:  Fall, initial encounter  Primary osteoarthritis of both knees      NEW MEDICATIONS STARTED DURING THIS VISIT:  New Prescriptions   PREDNISONE (DELTASONE) 10 MG TABLET    Take 1 tablet (10 mg total) by mouth daily.        This chart was dictated using voice recognition software/Dragon. Despite best efforts to proofread, errors can occur which can change the meaning. Any change was purely unintentional.    Racheal PatchesJonathan D Cuthriell, PA-C 09/13/16 2125    Minna AntisKevin Paduchowski, MD 09/13/16 2243

## 2016-09-13 NOTE — ED Triage Notes (Signed)
Pt to ED c/o back and bilateral leg/knee pain.  Pt states fell on Friday and has had worsening pain since.  Pt unable to bear weight on left leg, states "it gives out now".  Pt is A&Ox4, denies hitting or LOC.

## 2016-09-13 NOTE — ED Notes (Signed)
Patient returned from radiology. Denies need for anything at this time.

## 2016-09-18 DIAGNOSIS — M6281 Muscle weakness (generalized): Secondary | ICD-10-CM | POA: Diagnosis not present

## 2016-09-22 DIAGNOSIS — M6281 Muscle weakness (generalized): Secondary | ICD-10-CM | POA: Diagnosis not present

## 2016-09-28 DIAGNOSIS — M25562 Pain in left knee: Secondary | ICD-10-CM | POA: Diagnosis not present

## 2016-09-28 DIAGNOSIS — M25662 Stiffness of left knee, not elsewhere classified: Secondary | ICD-10-CM | POA: Diagnosis not present

## 2016-09-28 DIAGNOSIS — M25661 Stiffness of right knee, not elsewhere classified: Secondary | ICD-10-CM | POA: Diagnosis not present

## 2016-09-28 DIAGNOSIS — M25561 Pain in right knee: Secondary | ICD-10-CM | POA: Diagnosis not present

## 2016-10-01 DIAGNOSIS — M25562 Pain in left knee: Secondary | ICD-10-CM | POA: Diagnosis not present

## 2016-10-01 DIAGNOSIS — M25561 Pain in right knee: Secondary | ICD-10-CM | POA: Diagnosis not present

## 2016-10-02 DIAGNOSIS — I10 Essential (primary) hypertension: Secondary | ICD-10-CM | POA: Diagnosis not present

## 2016-10-02 DIAGNOSIS — E1165 Type 2 diabetes mellitus with hyperglycemia: Secondary | ICD-10-CM | POA: Diagnosis not present

## 2016-10-02 DIAGNOSIS — F17210 Nicotine dependence, cigarettes, uncomplicated: Secondary | ICD-10-CM | POA: Diagnosis not present

## 2016-10-02 DIAGNOSIS — K921 Melena: Secondary | ICD-10-CM | POA: Diagnosis not present

## 2016-10-06 DIAGNOSIS — K921 Melena: Secondary | ICD-10-CM | POA: Diagnosis not present

## 2016-10-12 DIAGNOSIS — M25662 Stiffness of left knee, not elsewhere classified: Secondary | ICD-10-CM | POA: Diagnosis not present

## 2016-10-12 DIAGNOSIS — M25562 Pain in left knee: Secondary | ICD-10-CM | POA: Diagnosis not present

## 2016-10-12 DIAGNOSIS — M25661 Stiffness of right knee, not elsewhere classified: Secondary | ICD-10-CM | POA: Diagnosis not present

## 2016-10-12 DIAGNOSIS — M25561 Pain in right knee: Secondary | ICD-10-CM | POA: Diagnosis not present

## 2016-10-14 DIAGNOSIS — M6281 Muscle weakness (generalized): Secondary | ICD-10-CM | POA: Diagnosis not present

## 2016-10-14 DIAGNOSIS — M5136 Other intervertebral disc degeneration, lumbar region: Secondary | ICD-10-CM | POA: Diagnosis not present

## 2016-10-14 DIAGNOSIS — M545 Low back pain: Secondary | ICD-10-CM | POA: Diagnosis not present

## 2016-10-14 DIAGNOSIS — M62830 Muscle spasm of back: Secondary | ICD-10-CM | POA: Diagnosis not present

## 2016-10-15 DIAGNOSIS — K921 Melena: Secondary | ICD-10-CM | POA: Diagnosis not present

## 2016-10-15 DIAGNOSIS — M170 Bilateral primary osteoarthritis of knee: Secondary | ICD-10-CM | POA: Diagnosis not present

## 2016-10-15 DIAGNOSIS — I6523 Occlusion and stenosis of bilateral carotid arteries: Secondary | ICD-10-CM | POA: Diagnosis not present

## 2016-10-15 DIAGNOSIS — G4733 Obstructive sleep apnea (adult) (pediatric): Secondary | ICD-10-CM | POA: Diagnosis not present

## 2016-10-15 DIAGNOSIS — R0602 Shortness of breath: Secondary | ICD-10-CM | POA: Diagnosis not present

## 2016-10-15 DIAGNOSIS — J441 Chronic obstructive pulmonary disease with (acute) exacerbation: Secondary | ICD-10-CM | POA: Diagnosis not present

## 2016-10-15 DIAGNOSIS — I10 Essential (primary) hypertension: Secondary | ICD-10-CM | POA: Diagnosis not present

## 2016-10-20 DIAGNOSIS — M25561 Pain in right knee: Secondary | ICD-10-CM | POA: Diagnosis not present

## 2016-10-20 DIAGNOSIS — M25661 Stiffness of right knee, not elsewhere classified: Secondary | ICD-10-CM | POA: Diagnosis not present

## 2016-10-20 DIAGNOSIS — M25562 Pain in left knee: Secondary | ICD-10-CM | POA: Diagnosis not present

## 2016-10-20 DIAGNOSIS — M25662 Stiffness of left knee, not elsewhere classified: Secondary | ICD-10-CM | POA: Diagnosis not present

## 2017-01-05 DIAGNOSIS — M25561 Pain in right knee: Secondary | ICD-10-CM | POA: Diagnosis not present

## 2017-01-05 DIAGNOSIS — M170 Bilateral primary osteoarthritis of knee: Secondary | ICD-10-CM | POA: Diagnosis not present

## 2017-01-05 DIAGNOSIS — M25562 Pain in left knee: Secondary | ICD-10-CM | POA: Diagnosis not present

## 2017-01-05 DIAGNOSIS — R262 Difficulty in walking, not elsewhere classified: Secondary | ICD-10-CM | POA: Diagnosis not present

## 2017-01-12 DIAGNOSIS — M25561 Pain in right knee: Secondary | ICD-10-CM | POA: Diagnosis not present

## 2017-01-12 DIAGNOSIS — M170 Bilateral primary osteoarthritis of knee: Secondary | ICD-10-CM | POA: Diagnosis not present

## 2017-01-12 DIAGNOSIS — M25562 Pain in left knee: Secondary | ICD-10-CM | POA: Diagnosis not present

## 2017-01-19 DIAGNOSIS — M170 Bilateral primary osteoarthritis of knee: Secondary | ICD-10-CM | POA: Diagnosis not present

## 2017-01-19 DIAGNOSIS — M25561 Pain in right knee: Secondary | ICD-10-CM | POA: Diagnosis not present

## 2017-01-19 DIAGNOSIS — M25562 Pain in left knee: Secondary | ICD-10-CM | POA: Diagnosis not present

## 2017-01-26 DIAGNOSIS — M25561 Pain in right knee: Secondary | ICD-10-CM | POA: Diagnosis not present

## 2017-01-26 DIAGNOSIS — M170 Bilateral primary osteoarthritis of knee: Secondary | ICD-10-CM | POA: Diagnosis not present

## 2017-01-26 DIAGNOSIS — M25562 Pain in left knee: Secondary | ICD-10-CM | POA: Diagnosis not present

## 2017-02-02 DIAGNOSIS — M25561 Pain in right knee: Secondary | ICD-10-CM | POA: Diagnosis not present

## 2017-02-02 DIAGNOSIS — M25562 Pain in left knee: Secondary | ICD-10-CM | POA: Diagnosis not present

## 2017-02-02 DIAGNOSIS — M170 Bilateral primary osteoarthritis of knee: Secondary | ICD-10-CM | POA: Diagnosis not present

## 2017-06-22 DIAGNOSIS — E1165 Type 2 diabetes mellitus with hyperglycemia: Secondary | ICD-10-CM | POA: Diagnosis not present

## 2017-06-22 DIAGNOSIS — M545 Low back pain: Secondary | ICD-10-CM | POA: Diagnosis not present

## 2017-06-22 DIAGNOSIS — M6281 Muscle weakness (generalized): Secondary | ICD-10-CM | POA: Diagnosis not present

## 2017-06-22 DIAGNOSIS — M5136 Other intervertebral disc degeneration, lumbar region: Secondary | ICD-10-CM | POA: Diagnosis not present

## 2017-06-22 DIAGNOSIS — M170 Bilateral primary osteoarthritis of knee: Secondary | ICD-10-CM | POA: Diagnosis not present

## 2017-06-22 DIAGNOSIS — I10 Essential (primary) hypertension: Secondary | ICD-10-CM | POA: Diagnosis not present

## 2017-06-22 DIAGNOSIS — M1712 Unilateral primary osteoarthritis, left knee: Secondary | ICD-10-CM | POA: Diagnosis not present

## 2017-06-24 DIAGNOSIS — L402 Acrodermatitis continua: Secondary | ICD-10-CM | POA: Diagnosis not present

## 2017-06-26 DIAGNOSIS — S83502A Sprain of unspecified cruciate ligament of left knee, initial encounter: Secondary | ICD-10-CM | POA: Diagnosis not present

## 2017-06-28 DIAGNOSIS — S83502A Sprain of unspecified cruciate ligament of left knee, initial encounter: Secondary | ICD-10-CM | POA: Diagnosis not present

## 2017-06-28 DIAGNOSIS — M19011 Primary osteoarthritis, right shoulder: Secondary | ICD-10-CM | POA: Diagnosis not present

## 2017-06-28 DIAGNOSIS — M25572 Pain in left ankle and joints of left foot: Secondary | ICD-10-CM | POA: Diagnosis not present

## 2017-06-28 DIAGNOSIS — M19072 Primary osteoarthritis, left ankle and foot: Secondary | ICD-10-CM | POA: Diagnosis not present

## 2017-06-28 DIAGNOSIS — S43421A Sprain of right rotator cuff capsule, initial encounter: Secondary | ICD-10-CM | POA: Diagnosis not present

## 2017-06-28 DIAGNOSIS — M19071 Primary osteoarthritis, right ankle and foot: Secondary | ICD-10-CM | POA: Diagnosis not present

## 2017-06-28 DIAGNOSIS — M25571 Pain in right ankle and joints of right foot: Secondary | ICD-10-CM | POA: Diagnosis not present

## 2017-07-01 DIAGNOSIS — M25511 Pain in right shoulder: Secondary | ICD-10-CM | POA: Diagnosis not present

## 2017-07-21 DIAGNOSIS — M7541 Impingement syndrome of right shoulder: Secondary | ICD-10-CM | POA: Diagnosis not present

## 2017-08-05 DIAGNOSIS — M25561 Pain in right knee: Secondary | ICD-10-CM | POA: Diagnosis not present

## 2017-08-05 DIAGNOSIS — M170 Bilateral primary osteoarthritis of knee: Secondary | ICD-10-CM | POA: Diagnosis not present

## 2017-08-05 DIAGNOSIS — M25562 Pain in left knee: Secondary | ICD-10-CM | POA: Diagnosis not present

## 2017-08-31 DIAGNOSIS — Z806 Family history of leukemia: Secondary | ICD-10-CM | POA: Diagnosis not present

## 2017-08-31 DIAGNOSIS — R3915 Urgency of urination: Secondary | ICD-10-CM | POA: Diagnosis not present

## 2017-08-31 DIAGNOSIS — Z800 Family history of malignant neoplasm of digestive organs: Secondary | ICD-10-CM | POA: Diagnosis not present

## 2017-08-31 DIAGNOSIS — Z808 Family history of malignant neoplasm of other organs or systems: Secondary | ICD-10-CM | POA: Diagnosis not present

## 2017-09-01 DIAGNOSIS — M1711 Unilateral primary osteoarthritis, right knee: Secondary | ICD-10-CM | POA: Diagnosis not present

## 2017-09-01 DIAGNOSIS — M545 Low back pain: Secondary | ICD-10-CM | POA: Diagnosis not present

## 2017-09-21 DIAGNOSIS — I10 Essential (primary) hypertension: Secondary | ICD-10-CM | POA: Diagnosis not present

## 2017-09-21 DIAGNOSIS — Z23 Encounter for immunization: Secondary | ICD-10-CM | POA: Diagnosis not present

## 2017-09-21 DIAGNOSIS — J441 Chronic obstructive pulmonary disease with (acute) exacerbation: Secondary | ICD-10-CM | POA: Diagnosis not present

## 2017-09-21 DIAGNOSIS — E1165 Type 2 diabetes mellitus with hyperglycemia: Secondary | ICD-10-CM | POA: Diagnosis not present

## 2017-09-21 DIAGNOSIS — F17210 Nicotine dependence, cigarettes, uncomplicated: Secondary | ICD-10-CM | POA: Diagnosis not present

## 2017-09-21 DIAGNOSIS — M170 Bilateral primary osteoarthritis of knee: Secondary | ICD-10-CM | POA: Diagnosis not present

## 2017-09-21 DIAGNOSIS — Z0001 Encounter for general adult medical examination with abnormal findings: Secondary | ICD-10-CM | POA: Diagnosis not present

## 2017-09-21 DIAGNOSIS — N390 Urinary tract infection, site not specified: Secondary | ICD-10-CM | POA: Diagnosis not present

## 2017-09-29 DIAGNOSIS — M170 Bilateral primary osteoarthritis of knee: Secondary | ICD-10-CM | POA: Diagnosis not present

## 2017-10-18 DIAGNOSIS — E1165 Type 2 diabetes mellitus with hyperglycemia: Secondary | ICD-10-CM | POA: Diagnosis not present

## 2017-10-18 DIAGNOSIS — E782 Mixed hyperlipidemia: Secondary | ICD-10-CM | POA: Diagnosis not present

## 2017-10-18 DIAGNOSIS — E079 Disorder of thyroid, unspecified: Secondary | ICD-10-CM | POA: Diagnosis not present

## 2017-10-18 DIAGNOSIS — I10 Essential (primary) hypertension: Secondary | ICD-10-CM | POA: Diagnosis not present

## 2017-10-18 DIAGNOSIS — Z0001 Encounter for general adult medical examination with abnormal findings: Secondary | ICD-10-CM | POA: Diagnosis not present

## 2017-10-18 DIAGNOSIS — E785 Hyperlipidemia, unspecified: Secondary | ICD-10-CM | POA: Diagnosis not present

## 2017-10-27 ENCOUNTER — Other Ambulatory Visit: Payer: Self-pay | Admitting: Internal Medicine

## 2017-11-29 ENCOUNTER — Other Ambulatory Visit: Payer: Self-pay

## 2017-11-29 MED ORDER — FLUTICASONE-SALMETEROL 250-50 MCG/DOSE IN AEPB: 1.0000 | INHALATION_SPRAY | Freq: Two times a day (BID) | RESPIRATORY_TRACT | 1 refills | Status: DC

## 2017-12-15 ENCOUNTER — Other Ambulatory Visit: Payer: Self-pay

## 2017-12-16 ENCOUNTER — Other Ambulatory Visit: Payer: Self-pay

## 2017-12-16 MED ORDER — FLUTICASONE-SALMETEROL 250-50 MCG/DOSE IN AEPB: 1.0000 | INHALATION_SPRAY | Freq: Two times a day (BID) | RESPIRATORY_TRACT | 1 refills | Status: DC

## 2018-01-13 ENCOUNTER — Other Ambulatory Visit: Payer: Self-pay

## 2018-01-13 MED ORDER — AMLODIPINE BESYLATE 10 MG PO TABS: 10.0000 mg | ORAL_TABLET | Freq: Every day | ORAL | 3 refills | Status: DC

## 2018-01-13 MED ORDER — ATORVASTATIN CALCIUM 20 MG PO TABS: 20.0000 mg | ORAL_TABLET | Freq: Every day | ORAL | 1 refills | Status: DC

## 2018-01-14 ENCOUNTER — Other Ambulatory Visit: Payer: Self-pay

## 2018-01-14 MED ORDER — CLOPIDOGREL BISULFATE 75 MG PO TABS: 75.0000 mg | ORAL_TABLET | Freq: Every day | ORAL | 1 refills | Status: DC

## 2018-01-20 ENCOUNTER — Ambulatory Visit: Payer: Self-pay | Admitting: Nurse Practitioner

## 2018-02-22 ENCOUNTER — Encounter: Payer: Self-pay | Admitting: Internal Medicine

## 2018-02-22 ENCOUNTER — Other Ambulatory Visit: Payer: Self-pay

## 2018-02-22 ENCOUNTER — Encounter (HOSPITAL_COMMUNITY): Payer: Self-pay | Admitting: Emergency Medicine

## 2018-02-22 ENCOUNTER — Ambulatory Visit: Payer: Self-pay | Admitting: Internal Medicine

## 2018-02-22 ENCOUNTER — Ambulatory Visit: Payer: Medicare HMO | Admitting: Internal Medicine

## 2018-02-22 ENCOUNTER — Emergency Department (HOSPITAL_COMMUNITY): Payer: Medicare HMO

## 2018-02-22 ENCOUNTER — Inpatient Hospital Stay (HOSPITAL_COMMUNITY)
Admission: EM | Admit: 2018-02-22 | Discharge: 2018-03-02 | DRG: 291 | Disposition: A | Discharge status: Skilled Nursing Facility | Payer: Medicare HMO | Attending: Internal Medicine | Admitting: Internal Medicine

## 2018-02-22 VITALS — BP 154/75 | HR 81 | Resp 16 | Ht 67.0 in | Wt 280.2 lb

## 2018-02-22 DIAGNOSIS — I5033 Acute on chronic diastolic (congestive) heart failure: Secondary | ICD-10-CM | POA: Diagnosis present

## 2018-02-22 DIAGNOSIS — E876 Hypokalemia: Secondary | ICD-10-CM | POA: Diagnosis not present

## 2018-02-22 DIAGNOSIS — E1169 Type 2 diabetes mellitus with other specified complication: Secondary | ICD-10-CM | POA: Diagnosis present

## 2018-02-22 DIAGNOSIS — J9601 Acute respiratory failure with hypoxia: Secondary | ICD-10-CM | POA: Diagnosis present

## 2018-02-22 DIAGNOSIS — R2681 Unsteadiness on feet: Secondary | ICD-10-CM | POA: Diagnosis not present

## 2018-02-22 DIAGNOSIS — E119 Type 2 diabetes mellitus without complications: Secondary | ICD-10-CM

## 2018-02-22 DIAGNOSIS — J440 Chronic obstructive pulmonary disease with acute lower respiratory infection: Secondary | ICD-10-CM | POA: Diagnosis not present

## 2018-02-22 DIAGNOSIS — E871 Hypo-osmolality and hyponatremia: Secondary | ICD-10-CM | POA: Diagnosis not present

## 2018-02-22 DIAGNOSIS — Z7902 Long term (current) use of antithrombotics/antiplatelets: Secondary | ICD-10-CM

## 2018-02-22 DIAGNOSIS — I509 Heart failure, unspecified: Secondary | ICD-10-CM | POA: Diagnosis not present

## 2018-02-22 DIAGNOSIS — E041 Nontoxic single thyroid nodule: Secondary | ICD-10-CM | POA: Diagnosis present

## 2018-02-22 DIAGNOSIS — Z79899 Other long term (current) drug therapy: Secondary | ICD-10-CM | POA: Diagnosis not present

## 2018-02-22 DIAGNOSIS — I700 Atherosclerosis of aorta: Secondary | ICD-10-CM | POA: Diagnosis present

## 2018-02-22 DIAGNOSIS — D751 Secondary polycythemia: Secondary | ICD-10-CM | POA: Diagnosis present

## 2018-02-22 DIAGNOSIS — J9602 Acute respiratory failure with hypercapnia: Secondary | ICD-10-CM | POA: Diagnosis present

## 2018-02-22 DIAGNOSIS — R0902 Hypoxemia: Secondary | ICD-10-CM

## 2018-02-22 DIAGNOSIS — R0602 Shortness of breath: Secondary | ICD-10-CM

## 2018-02-22 DIAGNOSIS — I110 Hypertensive heart disease with heart failure: Secondary | ICD-10-CM | POA: Diagnosis not present

## 2018-02-22 DIAGNOSIS — J9811 Atelectasis: Secondary | ICD-10-CM | POA: Diagnosis present

## 2018-02-22 DIAGNOSIS — E662 Morbid (severe) obesity with alveolar hypoventilation: Secondary | ICD-10-CM | POA: Diagnosis not present

## 2018-02-22 DIAGNOSIS — I671 Cerebral aneurysm, nonruptured: Secondary | ICD-10-CM | POA: Diagnosis present

## 2018-02-22 DIAGNOSIS — F17200 Nicotine dependence, unspecified, uncomplicated: Secondary | ICD-10-CM | POA: Diagnosis not present

## 2018-02-22 DIAGNOSIS — J80 Acute respiratory distress syndrome: Secondary | ICD-10-CM | POA: Diagnosis not present

## 2018-02-22 DIAGNOSIS — E669 Obesity, unspecified: Secondary | ICD-10-CM

## 2018-02-22 DIAGNOSIS — J209 Acute bronchitis, unspecified: Secondary | ICD-10-CM | POA: Diagnosis not present

## 2018-02-22 DIAGNOSIS — G4733 Obstructive sleep apnea (adult) (pediatric): Secondary | ICD-10-CM | POA: Diagnosis not present

## 2018-02-22 DIAGNOSIS — E785 Hyperlipidemia, unspecified: Secondary | ICD-10-CM | POA: Diagnosis not present

## 2018-02-22 DIAGNOSIS — K219 Gastro-esophageal reflux disease without esophagitis: Secondary | ICD-10-CM | POA: Diagnosis not present

## 2018-02-22 DIAGNOSIS — E878 Other disorders of electrolyte and fluid balance, not elsewhere classified: Secondary | ICD-10-CM | POA: Diagnosis present

## 2018-02-22 DIAGNOSIS — Z9119 Patient's noncompliance with other medical treatment and regimen: Secondary | ICD-10-CM

## 2018-02-22 DIAGNOSIS — J441 Chronic obstructive pulmonary disease with (acute) exacerbation: Secondary | ICD-10-CM | POA: Diagnosis not present

## 2018-02-22 DIAGNOSIS — I5031 Acute diastolic (congestive) heart failure: Secondary | ICD-10-CM | POA: Diagnosis not present

## 2018-02-22 DIAGNOSIS — Z8673 Personal history of transient ischemic attack (TIA), and cerebral infarction without residual deficits: Secondary | ICD-10-CM

## 2018-02-22 DIAGNOSIS — E1165 Type 2 diabetes mellitus with hyperglycemia: Secondary | ICD-10-CM | POA: Diagnosis not present

## 2018-02-22 DIAGNOSIS — J189 Pneumonia, unspecified organism: Secondary | ICD-10-CM | POA: Diagnosis present

## 2018-02-22 DIAGNOSIS — Z5189 Encounter for other specified aftercare: Secondary | ICD-10-CM | POA: Diagnosis not present

## 2018-02-22 DIAGNOSIS — Z6841 Body Mass Index (BMI) 40.0 and over, adult: Secondary | ICD-10-CM | POA: Diagnosis not present

## 2018-02-22 DIAGNOSIS — D72829 Elevated white blood cell count, unspecified: Secondary | ICD-10-CM | POA: Diagnosis not present

## 2018-02-22 DIAGNOSIS — I361 Nonrheumatic tricuspid (valve) insufficiency: Secondary | ICD-10-CM | POA: Diagnosis not present

## 2018-02-22 DIAGNOSIS — I10 Essential (primary) hypertension: Secondary | ICD-10-CM | POA: Diagnosis not present

## 2018-02-22 LAB — BASIC METABOLIC PANEL
Anion gap: 7 (ref 5–15)
BUN: 6 mg/dL (ref 6–20)
CHLORIDE: 101 mmol/L (ref 101–111)
CO2: 31 mmol/L (ref 22–32)
Calcium: 8.7 mg/dL — ABNORMAL LOW (ref 8.9–10.3)
Creatinine, Ser: 0.67 mg/dL (ref 0.44–1.00)
GFR calc Af Amer: >60 mL/min (ref >60)
GFR calc non Af Amer: >60 mL/min (ref >60)
GLUCOSE: 107 mg/dL — AB (ref 65–99)
POTASSIUM: 4 mmol/L (ref 3.5–5.1)
Sodium: 139 mmol/L (ref 135–145)

## 2018-02-22 LAB — CBC
HEMATOCRIT: 46.2 % — AB (ref 36.0–46.0)
HEMOGLOBIN: 14.3 g/dL (ref 12.0–15.0)
MCH: 30.5 pg (ref 26.0–34.0)
MCHC: 31 g/dL (ref 30.0–36.0)
MCV: 98.5 fL (ref 78.0–100.0)
Platelets: 205 10*3/uL (ref 150–400)
RBC: 4.69 MIL/uL (ref 3.87–5.11)
RDW: 15 % (ref 11.5–15.5)
WBC: 6 10*3/uL (ref 4.0–10.5)

## 2018-02-22 LAB — BLOOD GAS, ARTERIAL
ACID-BASE EXCESS: 10.8 mmol/L — AB (ref 0.0–2.0)
BICARBONATE: 37.9 mmol/L — AB (ref 20.0–28.0)
Drawn by: 25203
FIO2: 100
O2 SAT: 97.7 %
PATIENT TEMPERATURE: 98.6
pCO2 arterial: 87.3 mmHg (ref 32.0–48.0)
pH, Arterial: 7.261 — ABNORMAL LOW (ref 7.350–7.450)
pO2, Arterial: 125 mmHg — ABNORMAL HIGH (ref 83.0–108.0)

## 2018-02-22 LAB — I-STAT VENOUS BLOOD GAS, ED
Acid-Base Excess: 9 mmol/L — ABNORMAL HIGH (ref 0.0–2.0)
Bicarbonate: 38 mmol/L — ABNORMAL HIGH (ref 20.0–28.0)
O2 SAT: 78 %
PCO2 VEN: 69 mmHg — AB (ref 44.0–60.0)
TCO2: 40 mmol/L — AB (ref 22–32)
pH, Ven: 7.349 (ref 7.250–7.430)
pO2, Ven: 47 mmHg — ABNORMAL HIGH (ref 32.0–45.0)

## 2018-02-22 LAB — I-STAT TROPONIN, ED: Troponin i, poc: 0 ng/mL (ref 0.00–0.08)

## 2018-02-22 LAB — BRAIN NATRIURETIC PEPTIDE: B NATRIURETIC PEPTIDE 5: 45.3 pg/mL (ref 0.0–100.0)

## 2018-02-22 MED ORDER — BUDESONIDE 0.25 MG/2ML IN SUSP
0.2500 mg | Freq: Two times a day (BID) | RESPIRATORY_TRACT | Status: DC
  Administered 2018-02-23 – 2018-03-02 (×15): 0.25 mg via RESPIRATORY_TRACT
  Filled 2018-02-22 (×16): qty 2

## 2018-02-22 MED ORDER — ACETAMINOPHEN 325 MG PO TABS
650.0000 mg | ORAL_TABLET | Freq: Four times a day (QID) | ORAL | Status: DC | PRN
  Administered 2018-02-24: 650 mg via ORAL
  Filled 2018-02-22: qty 2

## 2018-02-22 MED ORDER — LISINOPRIL 2.5 MG PO TABS
2.5000 mg | ORAL_TABLET | Freq: Every day | ORAL | Status: DC
  Administered 2018-02-24 – 2018-03-02 (×7): 2.5 mg via ORAL
  Filled 2018-02-22 (×8): qty 1

## 2018-02-22 MED ORDER — INSULIN ASPART 100 UNIT/ML ~~LOC~~ SOLN
0.0000 [IU] | Freq: Three times a day (TID) | SUBCUTANEOUS | Status: DC
  Administered 2018-02-23: 2 [IU] via SUBCUTANEOUS
  Administered 2018-02-24 (×2): 1 [IU] via SUBCUTANEOUS
  Administered 2018-02-25 (×2): 2 [IU] via SUBCUTANEOUS
  Administered 2018-02-25 – 2018-02-26 (×2): 1 [IU] via SUBCUTANEOUS
  Administered 2018-02-26 – 2018-03-01 (×7): 2 [IU] via SUBCUTANEOUS
  Administered 2018-03-01: 1 [IU] via SUBCUTANEOUS
  Administered 2018-03-02 (×2): 2 [IU] via SUBCUTANEOUS
  Filled 2018-02-22: qty 1

## 2018-02-22 MED ORDER — FUROSEMIDE 10 MG/ML IJ SOLN
40.0000 mg | Freq: Two times a day (BID) | INTRAMUSCULAR | Status: DC
  Administered 2018-02-23: 40 mg via INTRAVENOUS
  Filled 2018-02-22: qty 4

## 2018-02-22 MED ORDER — FUROSEMIDE 10 MG/ML IJ SOLN
40.0000 mg | INTRAMUSCULAR | Status: AC
  Administered 2018-02-22: 40 mg via INTRAVENOUS
  Filled 2018-02-22: qty 4

## 2018-02-22 MED ORDER — IPRATROPIUM-ALBUTEROL 0.5-2.5 (3) MG/3ML IN SOLN
3.0000 mL | Freq: Once | RESPIRATORY_TRACT | Status: AC
  Administered 2018-02-22: 3 mL via RESPIRATORY_TRACT
  Filled 2018-02-22: qty 3

## 2018-02-22 MED ORDER — ONDANSETRON HCL 4 MG PO TABS: 4.0000 mg | ORAL_TABLET | Freq: Four times a day (QID) | ORAL | Status: DC | PRN

## 2018-02-22 MED ORDER — ATORVASTATIN CALCIUM 20 MG PO TABS
20.0000 mg | ORAL_TABLET | Freq: Every day | ORAL | Status: DC
  Administered 2018-02-24 – 2018-03-02 (×7): 20 mg via ORAL
  Filled 2018-02-22 (×5): qty 1
  Filled 2018-02-22: qty 2
  Filled 2018-02-22 (×2): qty 1

## 2018-02-22 MED ORDER — ALBUTEROL SULFATE (2.5 MG/3ML) 0.083% IN NEBU
2.5000 mg | INHALATION_SOLUTION | RESPIRATORY_TRACT | Status: DC
  Administered 2018-02-23 – 2018-02-24 (×7): 2.5 mg via RESPIRATORY_TRACT
  Filled 2018-02-22 (×7): qty 3

## 2018-02-22 MED ORDER — METHYLPREDNISOLONE SODIUM SUCC 125 MG IJ SOLR
125.0000 mg | Freq: Once | INTRAMUSCULAR | Status: AC
  Administered 2018-02-22: 125 mg via INTRAVENOUS
  Filled 2018-02-22: qty 2

## 2018-02-22 MED ORDER — ALBUTEROL SULFATE (2.5 MG/3ML) 0.083% IN NEBU: 2.5000 mg | INHALATION_SOLUTION | RESPIRATORY_TRACT | Status: DC | PRN

## 2018-02-22 MED ORDER — IPRATROPIUM BROMIDE 0.02 % IN SOLN
0.5000 mg | RESPIRATORY_TRACT | Status: DC
  Administered 2018-02-23 – 2018-02-24 (×7): 0.5 mg via RESPIRATORY_TRACT
  Filled 2018-02-22 (×7): qty 2.5

## 2018-02-22 MED ORDER — ONDANSETRON HCL 4 MG/2ML IJ SOLN: 4.0000 mg | Freq: Four times a day (QID) | INTRAMUSCULAR | Status: DC | PRN

## 2018-02-22 MED ORDER — CLOPIDOGREL BISULFATE 75 MG PO TABS
75.0000 mg | ORAL_TABLET | Freq: Every day | ORAL | Status: DC
  Administered 2018-02-24 – 2018-03-02 (×7): 75 mg via ORAL
  Filled 2018-02-22 (×8): qty 1

## 2018-02-22 MED ORDER — FLUTICASONE PROPIONATE 50 MCG/ACT NA SUSP: 2.0000 | Freq: Every day | NASAL | Status: DC

## 2018-02-22 MED ORDER — MAGNESIUM SULFATE 2 GM/50ML IV SOLN
2.0000 g | Freq: Once | INTRAVENOUS | Status: AC
  Administered 2018-02-22: 2 g via INTRAVENOUS
  Filled 2018-02-22: qty 50

## 2018-02-22 MED ORDER — METHYLPREDNISOLONE SODIUM SUCC 40 MG IJ SOLR
40.0000 mg | Freq: Every day | INTRAMUSCULAR | Status: DC
  Administered 2018-02-23: 40 mg via INTRAVENOUS
  Filled 2018-02-22: qty 1

## 2018-02-22 MED ORDER — ENOXAPARIN SODIUM 60 MG/0.6ML ~~LOC~~ SOLN
60.0000 mg | SUBCUTANEOUS | Status: DC
  Administered 2018-02-23 – 2018-03-02 (×8): 60 mg via SUBCUTANEOUS
  Filled 2018-02-22 (×9): qty 0.6

## 2018-02-22 MED ORDER — ALBUTEROL SULFATE (2.5 MG/3ML) 0.083% IN NEBU
5.0000 mg | INHALATION_SOLUTION | Freq: Once | RESPIRATORY_TRACT | Status: AC
  Administered 2018-02-22: 5 mg via RESPIRATORY_TRACT
  Filled 2018-02-22: qty 6

## 2018-02-22 MED ORDER — ACETAMINOPHEN 650 MG RE SUPP: 650.0000 mg | Freq: Four times a day (QID) | RECTAL | Status: DC | PRN

## 2018-02-22 NOTE — Progress Notes (Signed)
Patient refused CPAP for the night. Arterial blood gas drawn while patient was wearing 100%NRB.

## 2018-02-22 NOTE — Progress Notes (Signed)
[facility] [address] [place], [place] [address]  Internal MEDICINE  Office Visit Note  Patient Name: Karen Perez  [id number]  [id number]  Date of Service: 03/14/2018  Chief Complaint  Patient presents with  . Shortness of Breath  . Hypertension  . Knee Pain  . Follow-up    Hospital f/u , barin aneurysm     HPI  Pt is here for routine follow up. 67 year old female with past medical history including COPD, type 2 diabetes mellitus, hypertension, CVA, brain aneurysm who presents with shortness of breath.  Her O2 sats dropped to 84% on room air. She states that she is always short of breath and has been worse  today.  Daughter notes that she does have chronic shortness of breath but her dyspnea on exertion has been worse recently.  They have noticed some lower extremity edema bilaterally this week.  She has noticed an increase in her weight.  She endorses orthopnea.  She endorses occasional cough but no severe cough or fevers.  Daughter notes that she has some confusion with how and when to use her inhalers and daughter is concerned that she may be noncompliant with some of her medicines.  She has sleep apnea but does not use CPAP.  Current Medication: Outpatient Encounter Medications as of 02/22/2018  Medication Sig Note  . amLODipine (NORVASC) 10 MG tablet Take 1 tablet (10 mg total) by mouth daily. (Patient not taking: Reported on 02/22/2018)   . atorvastatin (LIPITOR) 20 MG tablet Take 1 tablet (20 mg total) by mouth daily.   . clopidogrel (PLAVIX) 75 MG tablet Take 1 tablet (75 mg total) by mouth daily. 02/22/2018: Pt out of medicaiton.   . fluticasone (FLONASE) 50 MCG/ACT nasal spray spray one spray in each nostril twice daily as needed for allergies.   . Fluticasone-Salmeterol (ADVAIR) 250-50 MCG/DOSE AEPB Inhale 1 puff into the lungs 2 (two) times daily.   Marland Kitchen KOMBIGLYZE XR 5-500 MG TB24 TAKE 1 TABLET BY MOUTH EVERY DAY FOR DIABETES   . lisinopril (PRINIVIL,ZESTRIL)  2.5 MG tablet Take 2.5 mg by mouth daily.    . predniSONE (DELTASONE) 10 MG tablet Take 1 tablet (10 mg total) by mouth daily. (Patient not taking: Reported on 02/22/2018)   . SPIRIVA RESPIMAT 1.25 MCG/ACT AERS USE ONE INHALATION A DAY   . traMADol (ULTRAM) 50 MG tablet TAKE 1 TABLET BY MOUTH TWICE A DAY FOR KNEE PAIN AS NEEDED (Patient not taking: Reported on 02/22/2018)   . [DISCONTINUED] diclofenac sodium (VOLTAREN) 1 % GEL Apply topically.   . [DISCONTINUED] escitalopram (LEXAPRO) 10 MG tablet Take by mouth.   . [DISCONTINUED] hydrocortisone 2.5 % ointment Apply topically.   . [DISCONTINUED] nystatin cream (MYCOSTATIN) Apply a small amount to affected area twice a day as needed.   . [DISCONTINUED] Vitamin D, Ergocalciferol, (DRISDOL) 50000 units CAPS capsule Take by mouth.    No facility-administered encounter medications on file as of 02/22/2018.     Surgical History: Past Surgical History:  Procedure Laterality Date  . CEREBRAL ANEURYSM REPAIR  2013    Medical History: Past Medical History:  Diagnosis Date  . Arthritis   . Asthma   . Brain aneurysm   . COPD (chronic obstructive pulmonary disease) (HCC)   . Diabetes mellitus without complication (HCC)   . Hypertension   . Stroke [facility]) 2003    Family History: Family History  Problem Relation Age of Onset  . Diabetes Mellitus II Daughter     Social  History   Socioeconomic History  . Marital status: Divorced    Spouse name: Not on file  . Number of children: Not on file  . Years of education: Not on file  . Highest education level: Not on file  Occupational History  . Not on file  Social Needs  . Financial resource strain: Not on file  . Food insecurity:    Worry: Not on file    Inability: Not on file  . Transportation needs:    Medical: Not on file    Non-medical: Not on file  Tobacco Use  . Smoking status: Current Every Day Smoker  . Smokeless tobacco: Never Used  Substance and Sexual Activity  . Alcohol use:  No  . Drug use: No  . Sexual activity: Not on file  Lifestyle  . Physical activity:    Days per week: Not on file    Minutes per session: Not on file  . Stress: Not on file  Relationships  . Social connections:    Talks on phone: Not on file    Gets together: Not on file    Attends religious service: Not on file    Active member of club or organization: Not on file    Attends meetings of clubs or organizations: Not on file    Relationship status: Not on file  . Intimate partner violence:    Fear of current or ex partner: Not on file    Emotionally abused: Not on file    Physically abused: Not on file    Forced sexual activity: Not on file  Other Topics Concern  . Not on file  Social History Narrative  . Not on file   Review of Systems  Constitutional: Negative for chills, diaphoresis and fatigue.  HENT: Negative for ear pain, postnasal drip and sinus pressure.   Eyes: Negative for photophobia, discharge, redness, itching and visual disturbance.  Respiratory: Positive for chest tightness and shortness of breath. Negative for cough and wheezing.   Cardiovascular: Positive for leg swelling. Negative for chest pain and palpitations.  Gastrointestinal: Negative for abdominal pain, constipation, diarrhea, nausea and vomiting.  Genitourinary: Negative for dysuria and flank pain.  Musculoskeletal: Negative for arthralgias, back pain, gait problem and neck pain.  Skin: Negative for color change.  Allergic/Immunologic: Negative for environmental allergies and food allergies.  Neurological: Negative for dizziness and headaches.  Hematological: Does not bruise/bleed easily.  Psychiatric/Behavioral: Negative for agitation, behavioral problems (depression) and hallucinations.  Vital Signs: BP (!) 154/75 (BP Location: Left Arm, Patient Position: Sitting)   Pulse 81   Resp 16   Ht 5\' 7"  (1.702 m)   Wt 280 lb 3.2 oz (127.1 kg)   SpO2 (!) 84%   BMI 43.89 kg/m   Physical Exam   Constitutional: She appears well-developed and well-nourished. She appears distressed.  HENT:  Head: Normocephalic and atraumatic.  Eyes: Pupils are equal, round, and reactive to light. Conjunctivae and EOM are normal.  Cardiovascular: Normal rate and regular rhythm.  Pulmonary/Chest: She is in respiratory distress. She has wheezes.   Assessment/Plan: 1. Acute bronchitis with COPD (HCC) - Pt is being sent ot ED for futher evaluation  2. Hypoxia - RA sats 84%  3. Diabetes mellitus without complication (HCC) - POCT HgB A1C 5.6  Pt is send to ED for further eval and treatment and possible admission   Orders Placed This Encounter  Procedures  . POCT HgB A1C    Time spent:25Minutes  Dr Lyndon CodeFozia M Khan  Internal medicine

## 2018-02-22 NOTE — ED Notes (Signed)
ED Provider at bedside. 

## 2018-02-22 NOTE — ED Provider Notes (Signed)
[facility] Provider Note   CSN: [id number] Arrival date & time: 02/22/18  1247     History   Chief Complaint Chief Complaint  Patient presents with  . Shortness of Breath    HPI Karen Perez is a 67 y.o. female.  67 year old female with past medical history including COPD, type 2 diabetes mellitus, hypertension, CVA, brain aneurysm who presents with shortness of breath.  Patient went to her PCP today for routine follow-up appointment and was noted to be dyspneic and hypoxic to 84% on room air.  She was sent to the ED for further evaluation.  She states that she is always short of breath and nothing is different today.  Daughter notes that she does have chronic shortness of breath but her dyspnea on exertion has been worse recently.  They have noticed some lower extremity edema bilaterally this week.  She has noticed an increase in her weight.  She endorses orthopnea.  She endorses occasional cough but no severe cough or fevers.  Daughter notes that she has some confusion with how and when to use her inhalers and daughter is concerned that she may be noncompliant with some of her medicines.  She has sleep apnea but does not use CPAP.  The history is provided by the patient and a relative.  Shortness of Breath     Past Medical History:  Diagnosis Date  . Arthritis   . Asthma   . Brain aneurysm   . COPD (chronic obstructive pulmonary disease) (HCC)   . Diabetes mellitus without complication (HCC)   . Hypertension   . Stroke [facility](HCC) 2003    Patient Active Problem List   Diagnosis Date Noted  . HOT FLASHES 05/02/2010  . DIABETES MELLITUS, TYPE II 01/24/2010  . SKIN TAG 10/29/2009  . KNEE PAIN, BILATERAL 10/04/2009  . CHRONIC RHINITIS 08/01/2009  . VOCAL CORD DISORDER 08/01/2009  . GERD 08/01/2009  . SKIN RASH 06/28/2009  . HYPOXEMIA 05/21/2009  . Nonspecific (abnormal) findings on radiological and other examination of body structure  05/16/2009  . ABNORMAL CHEST XRAY 05/16/2009  . OBESITY, MORBID 04/30/2009  . TOBACCO USER 04/30/2009  . TINEA CORPORIS 04/19/2009  . HEMOCCULT POSITIVE STOOL 02/07/2009  . LEG CRAMPS 12/24/2008  . DERMATITIS, ALLERGIC 06/28/2008  . BACK PAIN, LUMBAR 05/04/2008  . OBSTRUCTIVE SLEEP APNEA 10/17/2007  . HYPERTENSION 04/20/2007  . HYPERLIPIDEMIA 03/21/2007  . DEPRESSION 03/21/2007  . CEREBRAL ANEURYSM 03/21/2007  . COPD 03/21/2007  . ARTHRITIS 03/21/2007  . DVT, HX OF 03/21/2007    Past Surgical History:  Procedure Laterality Date  . CEREBRAL ANEURYSM REPAIR  2013     OB History   None      Home Medications    Prior to Admission medications   Medication Sig Start Date End Date Taking? Authorizing Provider  acetaminophen (TYLENOL 8 HOUR ARTHRITIS PAIN) 650 MG CR tablet Take 1,300 mg by mouth every 8 (eight) hours as needed for pain.   Yes [provider]  atorvastatin (LIPITOR) 20 MG tablet Take 1 tablet (20 mg total) by mouth daily. 01/13/18  Yes Carlean JewsBoscia, Heather E, NP  clopidogrel (PLAVIX) 75 MG tablet Take 1 tablet (75 mg total) by mouth daily. 01/14/18  Yes Boscia, Heather E, NP  diphenhydramine-acetaminophen (TYLENOL PM) 25-500 MG TABS tablet Take 2 tablets by mouth at bedtime as needed (sleep).   Yes [provider]  fluticasone (FLONASE) 50 MCG/ACT nasal spray spray one spray in each nostril twice daily  as needed for allergies. 07/27/11  Yes [provider]  Fluticasone-Salmeterol (ADVAIR) 250-50 MCG/DOSE AEPB Inhale 1 puff into the lungs 2 (two) times daily. 12/16/17  Yes Boscia, Heather E, NP  KOMBIGLYZE XR 5-500 MG TB24 TAKE 1 TABLET BY MOUTH EVERY DAY FOR DIABETES 10/16/17  Yes [provider]  lisinopril (PRINIVIL,ZESTRIL) 2.5 MG tablet Take 2.5 mg by mouth daily.    Yes [provider]  SPIRIVA RESPIMAT 1.25 MCG/ACT AERS USE ONE INHALATION A DAY 10/20/17  Yes [provider]  amLODipine (NORVASC) 10 MG tablet Take 1  tablet (10 mg total) by mouth daily. Patient not taking: Reported on 02/22/2018 01/13/18   Carlean Jews, NP  predniSONE (DELTASONE) 10 MG tablet Take 1 tablet (10 mg total) by mouth daily. Patient not taking: Reported on 02/22/2018 09/13/16   Cuthriell, Delorise Royals, PA-C  traMADol (ULTRAM) 50 MG tablet TAKE 1 TABLET BY MOUTH TWICE A DAY FOR KNEE PAIN AS NEEDED Patient not taking: Reported on 02/22/2018 10/28/17   Carlean Jews, NP    Family History History reviewed. No pertinent family history.  Social History Social History   Tobacco Use  . Smoking status: Current Every Day Smoker  . Smokeless tobacco: Never Used  Substance Use Topics  . Alcohol use: No  . Drug use: No     Allergies   Amoxicillin-pot clavulanate and Neosporin [neomycin-bacitracin zn-polymyx]   Review of Systems Review of Systems  Respiratory: Positive for shortness of breath.    All other systems reviewed and are negative except that which was mentioned in HPI   Physical Exam Updated Vital Signs BP 128/80 (BP Location: Left Arm)   Pulse 81   Temp 97.9 F (36.6 C) (Oral)   Resp 18   SpO2 99%   Physical Exam  Constitutional: She is oriented to person, place, and time. She appears well-developed and well-nourished. No distress.  HENT:  Head: Normocephalic and atraumatic.  Moist mucous membranes  Eyes: Conjunctivae are normal.  Neck: Neck supple.  Cardiovascular: Normal rate, regular rhythm and normal heart sounds.  No murmur heard. Pulmonary/Chest: Accessory muscle usage present. She has wheezes.  Increased WOB with prolonged expiratory phase, unable to speak in complete sentences; diffuse expiratory wheezes b/l  Abdominal: Soft. Bowel sounds are normal. She exhibits no distension. There is no tenderness.  Musculoskeletal: She exhibits no edema.       Right lower leg: She exhibits no edema.       Left lower leg: She exhibits no edema.  1+ pitting BLE  Neurological: She is alert and oriented  to person, place, and time.  Fluent speech  Skin: Skin is warm and dry.  Psychiatric: She has a normal mood and affect. Judgment normal.  Nursing note and vitals reviewed.    ED Treatments / Results  Labs (all labs ordered are listed, but only abnormal results are displayed) Labs Reviewed  BASIC METABOLIC PANEL - Abnormal; Notable for the following components:      Result Value   Glucose, Bld 107 (*)    Calcium 8.7 (*)    All other components within normal limits  CBC - Abnormal; Notable for the following components:   HCT 46.2 (*)    All other components within normal limits  BRAIN NATRIURETIC PEPTIDE  I-STAT TROPONIN, ED  I-STAT VENOUS BLOOD GAS, ED    EKG EKG Interpretation  Date/Time:  Tuesday February 22 2018 12:54:34 EDT Ventricular Rate:  78 PR Interval:  158 QRS Duration: 86  QT Interval:  380 QTC Calculation: 433 R Axis:   63 Text Interpretation:  Normal sinus rhythm Normal ECG similar to previous Confirmed by Frederick Peers [phone]) on 02/22/2018 8:33:08 PM   Radiology Dg Chest 2 View  Result Date: 02/22/2018 CLINICAL DATA:  67 year old female with shortness of breath. Lower extremity swelling for 1 week. EXAM: CHEST - 2 VIEW COMPARISON:  09/09/2016 and earlier. FINDINGS: No layering pleural effusion, but there is a small volume of fluid along the right minor fissure which is new. Mild-to-moderate cardiomegaly appears increased since Apr 01, 2016. Other mediastinal contours are within normal limits. Visualized tracheal air column is within normal limits. No pneumothorax or consolidation. Diffuse pulmonary vascular congestion, more pronounced than on the Apr 01, 2016 comparison. No acute osseous abnormality identified. Negative visible bowel gas pattern. IMPRESSION: 1. Pulmonary interstitial edema with small volume pleural fluid in the right minor fissure. 2. Mild-to-moderate cardiomegaly may have increased since Apr 01, 2016. Electronically Signed   By: Odessa Fleming M.D.   On: 02/22/2018 14:45     Procedures Procedures (including critical care time)  Medications Ordered in ED Medications  ipratropium-albuterol (DUONEB) 0.5-2.5 (3) MG/3ML nebulizer solution 3 mL (has no administration in time range)  furosemide (LASIX) injection 40 mg (has no administration in time range)  methylPREDNISolone sodium succinate (SOLU-MEDROL) 125 mg/2 mL injection 125 mg (has no administration in time range)  magnesium sulfate IVPB 2 g 50 mL (has no administration in time range)  albuterol (PROVENTIL) (2.5 MG/3ML) 0.083% nebulizer solution 5 mg (5 mg Nebulization Given 02/22/18 1259)     Initial Impression / Assessment and Plan / ED Course  I have reviewed the triage vital signs and the nursing notes.  Pertinent labs & imaging results that were available during my care of the patient were reviewed by me and considered in my medical decision making (see chart for details).     PT was dyspneic on my exam but non-toxic. Diffuse expiratory wheezes, some LE edema b/l. Requiring 3L O2. Gave duoneb and solumedrol for wheezing.  EKG without ischemic changes, labs show negative troponin, normal creatinine, reassuring CBC.  Chest x-ray shows interstitial edema with right pleural effusion and cardiomegaly.  I suspect a degree of CHF in combination with COPD.  Gave IV Lasix. Discussed admission with Dr. Toniann Fail, [facility], and pt admitted for further diuresis and work up.  Final Clinical Impressions(s) / ED Diagnoses   Final diagnoses:  None    ED Discharge Orders    None       Little, Ambrose Finland, MD 02/23/18 [phone]

## 2018-02-22 NOTE — ED Triage Notes (Signed)
Pt went to MD office today for regular check up and her MD noticed she is SOB and her O2 was low in office. Pt does not recall how low. sats 99% on room air in triage. Pt does sounds SOB while talking, but states she doesn't feels especially SOB. Denies CP. Pt states her feet and legs have been swelling for a week.

## 2018-02-22 NOTE — H&P (Signed)
History and Physical    LEELOO SILVERTHORNE ZOX:[id number] DOB: 13-May-1951 DOA: 02/22/2018  PCP: Lyndon Code, MD  Patient coming from: Home.  Chief Complaint: Hypoxia.  HPI: Karen Perez is a 67 y.o. female with history of COPD with ongoing tobacco abuse, cerebral aneurysm status post clipping and coiling, hypertension, diabetes mellitus type 2 was referred to the ER after patient was found to be hypoxic at primary care's office.  Patient had gone for a routine visit when patient was found to be hypoxic.  As per the patient's daughter patient has been increasingly short of breath last few months and gets short of breath with minimal exertion.  Has not had any chest pain fever chills.  Has been having some cough off and on.  ED Course: In the ER patient is found to be hypoxic and on my exam also appears to be mildly confused.  Chest x-ray shows congestion and cardiomegaly.  EKG shows normal sinus rhythm.  On exam patient has bilateral expiratory wheeze.  Patient was given Solu-Medrol nebulizer treatment for COPD and Lasix for possible CHF.  Review of Systems: As per HPI, rest all negative.   Past Medical History:  Diagnosis Date  . Arthritis   . Asthma   . Brain aneurysm   . COPD (chronic obstructive pulmonary disease) (HCC)   . Diabetes mellitus without complication (HCC)   . Hypertension   . Stroke [facility]) 2003    Past Surgical History:  Procedure Laterality Date  . CEREBRAL ANEURYSM REPAIR  2013     reports that she has been smoking.  She has never used smokeless tobacco. She reports that she does not drink alcohol or use drugs.  Allergies  Allergen Reactions  . Amoxicillin-Pot Clavulanate     REACTION: Diarrhea  . Neosporin [Neomycin-Bacitracin Zn-Polymyx] Hives    Family History  Problem Relation Age of Onset  . Diabetes Mellitus II Daughter     Prior to Admission medications   Medication Sig Start Date End Date Taking? Authorizing Provider  acetaminophen (TYLENOL 8  HOUR ARTHRITIS PAIN) 650 MG CR tablet Take 1,300 mg by mouth every 8 (eight) hours as needed for pain.   Yes [provider]  atorvastatin (LIPITOR) 20 MG tablet Take 1 tablet (20 mg total) by mouth daily. 01/13/18  Yes Carlean Jews, NP  clopidogrel (PLAVIX) 75 MG tablet Take 1 tablet (75 mg total) by mouth daily. 01/14/18  Yes Boscia, Heather E, NP  diphenhydramine-acetaminophen (TYLENOL PM) 25-500 MG TABS tablet Take 2 tablets by mouth at bedtime as needed (sleep).   Yes [provider]  fluticasone (FLONASE) 50 MCG/ACT nasal spray spray one spray in each nostril twice daily as needed for allergies. 07/27/11  Yes [provider]  Fluticasone-Salmeterol (ADVAIR) 250-50 MCG/DOSE AEPB Inhale 1 puff into the lungs 2 (two) times daily. 12/16/17  Yes Boscia, Heather E, NP  KOMBIGLYZE XR 5-500 MG TB24 TAKE 1 TABLET BY MOUTH EVERY DAY FOR DIABETES 10/16/17  Yes [provider]  lisinopril (PRINIVIL,ZESTRIL) 2.5 MG tablet Take 2.5 mg by mouth daily.    Yes [provider]  SPIRIVA RESPIMAT 1.25 MCG/ACT AERS USE ONE INHALATION A DAY 10/20/17  Yes [provider]  amLODipine (NORVASC) 10 MG tablet Take 1 tablet (10 mg total) by mouth daily. Patient not taking: Reported on 02/22/2018 01/13/18   Carlean Jews, NP  predniSONE (DELTASONE) 10 MG tablet Take 1 tablet (10 mg total) by mouth daily. Patient not taking: Reported  on 02/22/2018 09/13/16   Cuthriell, Delorise RoyalsJonathan D, PA-C  traMADol (ULTRAM) 50 MG tablet TAKE 1 TABLET BY MOUTH TWICE A DAY FOR KNEE PAIN AS NEEDED Patient not taking: Reported on 02/22/2018 10/28/17   Carlean JewsBoscia, Heather E, NP    Physical Exam: Vitals:   02/22/18 2130 02/22/18 2148 02/22/18 2215 02/22/18 2245  BP: 125/76 (!) 127/55 113/64 119/67  Pulse: 72  75 77  Resp: (!) 31 (!) 37 (!) 28 (!) 24  Temp:      TempSrc:      SpO2: (!) 89%  (!) 87% (!) 85%      Constitutional: Moderately built and nourished. Vitals:   02/22/18 2130 02/22/18  2148 02/22/18 2215 02/22/18 2245  BP: 125/76 (!) 127/55 113/64 119/67  Pulse: 72  75 77  Resp: (!) 31 (!) 37 (!) 28 (!) 24  Temp:      TempSrc:      SpO2: (!) 89%  (!) 87% (!) 85%   Eyes: Anicteric no pallor. ENMT: No discharge from the ears eyes nose or mouth. Neck: JVD not appreciable.  No mass felt. Respiratory: Bilateral expiratory wheezes no crepitations. Cardiovascular: S1-S2 heard no murmurs appreciated. Abdomen: Soft nontender bowel sounds present. Musculoskeletal: No edema.  No joint effusion. Skin: No rash.  Skin appears warm. Neurologic: Alert awake oriented to place and person and time but appears mildly lethargic.  Moves all extremities. Psychiatric: Appears normal but mildly lethargic.   Labs on Admission: I have personally reviewed following labs and imaging studies  CBC: Recent Labs  Lab 02/22/18 1310  WBC 6.0  HGB 14.3  HCT 46.2*  MCV 98.5  PLT 205   Basic Metabolic Panel: Recent Labs  Lab 02/22/18 1310  NA 139  K 4.0  CL 101  CO2 31  GLUCOSE 107*  BUN 6  CREATININE 0.67  CALCIUM 8.7*   GFR: Estimated Creatinine Clearance: 93.3 mL/min (by C-G formula based on SCr of 0.67 mg/dL). Liver Function Tests: No results for input(s): AST, ALT, ALKPHOS, BILITOT, PROT, ALBUMIN in the last 168 hours. No results for input(s): LIPASE, AMYLASE in the last 168 hours. No results for input(s): AMMONIA in the last 168 hours. Coagulation Profile: No results for input(s): INR, PROTIME in the last 168 hours. Cardiac Enzymes: No results for input(s): CKTOTAL, CKMB, CKMBINDEX, TROPONINI in the last 168 hours. BNP (last 3 results) No results for input(s): PROBNP in the last 8760 hours. HbA1C: No results for input(s): HGBA1C in the last 72 hours. CBG: No results for input(s): GLUCAP in the last 168 hours. Lipid Profile: No results for input(s): CHOL, HDL, LDLCALC, TRIG, CHOLHDL, LDLDIRECT in the last 72 hours. Thyroid Function Tests: No results for input(s):  TSH, T4TOTAL, FREET4, T3FREE, THYROIDAB in the last 72 hours. Anemia Panel: No results for input(s): VITAMINB12, FOLATE, FERRITIN, TIBC, IRON, RETICCTPCT in the last 72 hours. Urine analysis:    Component Value Date/Time   COLORURINE straw 02/07/2009 0000   APPEARANCEUR Hazy 02/07/2009 0000   LABSPEC 1.020 02/07/2009 0000   PHURINE 5.0 02/07/2009 0000   HGBUR large 02/07/2009 0000   BILIRUBINUR negative 02/07/2009 0000   UROBILINOGEN negative 02/07/2009 0000   NITRITE negative 02/07/2009 0000   Sepsis Labs: @LABRCNTIP (procalcitonin:4,lacticidven:4) )No results found for this or any previous visit (from the past 240 hour(s)).   Radiological Exams on Admission: Dg Chest 2 View  Result Date: 02/22/2018 CLINICAL DATA:  67 year old female with shortness of breath. Lower extremity swelling for 1 week. EXAM: CHEST - 2 VIEW  COMPARISON:  09/09/2016 and earlier. FINDINGS: No layering pleural effusion, but there is a small volume of fluid along the right minor fissure which is new. Mild-to-moderate cardiomegaly appears increased since 31-Mar-2016. Other mediastinal contours are within normal limits. Visualized tracheal air column is within normal limits. No pneumothorax or consolidation. Diffuse pulmonary vascular congestion, more pronounced than on the 03/31/16 comparison. No acute osseous abnormality identified. Negative visible bowel gas pattern. IMPRESSION: 1. Pulmonary interstitial edema with small volume pleural fluid in the right minor fissure. 2. Mild-to-moderate cardiomegaly may have increased since Mar 31, 2016. Electronically Signed   By: Odessa Fleming M.D.   On: 02/22/2018 14:45    EKG: Independently reviewed.  Normal sinus rhythm.  Assessment/Plan Principal Problem:   Acute respiratory failure with hypoxia (HCC) Active Problems:   OBESITY, MORBID   TOBACCO USER   OBSTRUCTIVE SLEEP APNEA   Essential hypertension   CEREBRAL ANEURYSM   COPD with acute exacerbation (HCC)   Diabetes mellitus type 2 in obese  (HCC)    1. Acute respiratory failure with hypoxia and hypercarbia -ABG done shortly after my exam shows hypoxia and hypercarbia with pH of 7.26.  Patient symptoms likely from a combination of COPD/sleep apnea and CHF.  I have placed patient on BiPAP.  Will repeat ABG.  Continue with nebulizer treatment steroids Lasix.  Check 2D echo.  Closely follow intake output metabolic panel and daily weights. 2. Hypertension on Norvasc and lisinopril.  I have also place patient on PRN IV hydralazine. 3. Diabetes mellitus type 2 -patient on sliding scale coverage.  Closely follow CBGs since patient is on steroids. 4. History of sleep apnea see #1.  As per the daughter patient is noncompliant. 5. Ongoing tobacco abuse -tobacco cessation counseling requested. 6. History of cerebral aneurysm status post clipping and coiling with history of stroke on statins and Plavix. 7. Morbid obesity.   DVT prophylaxis: Lovenox. Code Status: Full code. Family Communication: Patient's daughter. Disposition Plan: Home. Consults called: None. Admission status: Inpatient.   Eduard Clos MD [facility] Pager [phone].  If 7PM-7AM, please contact night-coverage [email or web address] Password [facility]  02/22/2018, 11:47 PM

## 2018-02-23 ENCOUNTER — Inpatient Hospital Stay (HOSPITAL_COMMUNITY): Payer: Medicare HMO

## 2018-02-23 ENCOUNTER — Other Ambulatory Visit (HOSPITAL_COMMUNITY): Payer: Medicare HMO

## 2018-02-23 ENCOUNTER — Other Ambulatory Visit: Payer: Self-pay

## 2018-02-23 DIAGNOSIS — I10 Essential (primary) hypertension: Secondary | ICD-10-CM

## 2018-02-23 DIAGNOSIS — F17200 Nicotine dependence, unspecified, uncomplicated: Secondary | ICD-10-CM

## 2018-02-23 DIAGNOSIS — I361 Nonrheumatic tricuspid (valve) insufficiency: Secondary | ICD-10-CM

## 2018-02-23 DIAGNOSIS — G4733 Obstructive sleep apnea (adult) (pediatric): Secondary | ICD-10-CM

## 2018-02-23 DIAGNOSIS — I671 Cerebral aneurysm, nonruptured: Secondary | ICD-10-CM

## 2018-02-23 DIAGNOSIS — J9602 Acute respiratory failure with hypercapnia: Secondary | ICD-10-CM

## 2018-02-23 LAB — I-STAT ARTERIAL BLOOD GAS, ED
Acid-Base Excess: 9 mmol/L — ABNORMAL HIGH (ref 0.0–2.0)
BICARBONATE: 38.2 mmol/L — AB (ref 20.0–28.0)
O2 SAT: 84 %
PCO2 ART: 69.2 mmHg — AB (ref 32.0–48.0)
PO2 ART: 53 mmHg — AB (ref 83.0–108.0)
TCO2: 40 mmol/L — ABNORMAL HIGH (ref 22–32)
pH, Arterial: 7.35 (ref 7.350–7.450)

## 2018-02-23 LAB — CBC
HCT: 48 % — ABNORMAL HIGH (ref 36.0–46.0)
HEMATOCRIT: 48.1 % — AB (ref 36.0–46.0)
HEMOGLOBIN: 14.6 g/dL (ref 12.0–15.0)
HEMOGLOBIN: 14.5 g/dL (ref 12.0–15.0)
MCH: 29.9 pg (ref 26.0–34.0)
MCH: 29.8 pg (ref 26.0–34.0)
MCHC: 30.4 g/dL (ref 30.0–36.0)
MCHC: 30.1 g/dL (ref 30.0–36.0)
MCV: 98.4 fL (ref 78.0–100.0)
MCV: 99 fL (ref 78.0–100.0)
Platelets: 209 10*3/uL (ref 150–400)
Platelets: 210 10*3/uL (ref 150–400)
RBC: 4.88 MIL/uL (ref 3.87–5.11)
RBC: 4.86 MIL/uL (ref 3.87–5.11)
RDW: 14.7 % (ref 11.5–15.5)
RDW: 14.8 % (ref 11.5–15.5)
WBC: 7.6 10*3/uL (ref 4.0–10.5)
WBC: 6 10*3/uL (ref 4.0–10.5)

## 2018-02-23 LAB — BASIC METABOLIC PANEL
ANION GAP: 13 (ref 5–15)
BUN: 6 mg/dL (ref 6–20)
CHLORIDE: 98 mmol/L — AB (ref 101–111)
CO2: 30 mmol/L (ref 22–32)
Calcium: 8.6 mg/dL — ABNORMAL LOW (ref 8.9–10.3)
Creatinine, Ser: 0.76 mg/dL (ref 0.44–1.00)
GFR calc Af Amer: >60 mL/min (ref >60)
GLUCOSE: 167 mg/dL — AB (ref 65–99)
Potassium: 4.3 mmol/L (ref 3.5–5.1)
Sodium: 141 mmol/L (ref 135–145)

## 2018-02-23 LAB — D-DIMER, QUANTITATIVE: D-Dimer, Quant: 0.68 ug/mL-FEU — ABNORMAL HIGH (ref 0.00–0.50)

## 2018-02-23 LAB — TROPONIN I: Troponin I: <0.03 ng/mL (ref <0.03)

## 2018-02-23 LAB — POCT GLYCOSYLATED HEMOGLOBIN (HGB A1C): Hemoglobin A1C: 5.6

## 2018-02-23 LAB — BLOOD GAS, ARTERIAL
ACID-BASE EXCESS: 10.5 mmol/L — AB (ref 0.0–2.0)
BICARBONATE: 37.6 mmol/L — AB (ref 20.0–28.0)
DELIVERY SYSTEMS: POSITIVE
DRAWN BY: 252031
Expiratory PAP: 6
FIO2: 60
Inspiratory PAP: 16
O2 Saturation: 90.5 %
PATIENT TEMPERATURE: 98.6
PH ART: 7.262 — AB (ref 7.350–7.450)
pCO2 arterial: 86.2 mmHg (ref 32.0–48.0)
pO2, Arterial: 69.4 mmHg — ABNORMAL LOW (ref 83.0–108.0)

## 2018-02-23 LAB — GLUCOSE, CAPILLARY
GLUCOSE-CAPILLARY: 143 mg/dL — AB (ref 65–99)
GLUCOSE-CAPILLARY: 125 mg/dL — AB (ref 65–99)

## 2018-02-23 LAB — ECHOCARDIOGRAM COMPLETE
HEIGHTINCHES: 67 in
WEIGHTICAEL: 4483.2 [oz_av]

## 2018-02-23 LAB — CBG MONITORING, ED: GLUCOSE-CAPILLARY: 163 mg/dL — AB (ref 65–99)

## 2018-02-23 LAB — MRSA PCR SCREENING: MRSA BY PCR: NEGATIVE

## 2018-02-23 LAB — CREATININE, SERUM
CREATININE: 0.7 mg/dL (ref 0.44–1.00)
GFR calc Af Amer: >60 mL/min (ref >60)
GFR calc non Af Amer: >60 mL/min (ref >60)

## 2018-02-23 LAB — TSH: TSH: 0.415 u[IU]/mL (ref 0.350–4.500)

## 2018-02-23 MED ORDER — FUROSEMIDE 10 MG/ML IJ SOLN
40.0000 mg | Freq: Once | INTRAMUSCULAR | Status: AC
  Administered 2018-02-23: 40 mg via INTRAVENOUS
  Filled 2018-02-23: qty 4

## 2018-02-23 MED ORDER — FUROSEMIDE 10 MG/ML IJ SOLN
80.0000 mg | Freq: Two times a day (BID) | INTRAMUSCULAR | Status: DC
  Administered 2018-02-23 – 2018-03-01 (×12): 80 mg via INTRAVENOUS
  Filled 2018-02-23 (×14): qty 8

## 2018-02-23 MED ORDER — IOPAMIDOL (ISOVUE-370) INJECTION 76%
INTRAVENOUS | Status: AC
  Filled 2018-02-23: qty 100

## 2018-02-23 MED ORDER — SODIUM CHLORIDE 0.9 % IV SOLN
500.0000 mg | INTRAVENOUS | Status: DC
  Administered 2018-02-23: 500 mg via INTRAVENOUS
  Filled 2018-02-23 (×2): qty 500

## 2018-02-23 MED ORDER — AMLODIPINE BESYLATE 10 MG PO TABS
10.0000 mg | ORAL_TABLET | Freq: Every day | ORAL | Status: DC
  Administered 2018-02-24 – 2018-03-02 (×7): 10 mg via ORAL
  Filled 2018-02-23 (×8): qty 1

## 2018-02-23 MED ORDER — FLUTICASONE PROPIONATE 50 MCG/ACT NA SUSP
2.0000 | Freq: Every day | NASAL | Status: DC | PRN
  Filled 2018-02-23: qty 16

## 2018-02-23 MED ORDER — HYDRALAZINE HCL 20 MG/ML IJ SOLN
10.0000 mg | INTRAMUSCULAR | Status: DC | PRN
  Administered 2018-02-23: 10 mg via INTRAVENOUS
  Filled 2018-02-23: qty 1

## 2018-02-23 MED ORDER — METHYLPREDNISOLONE SODIUM SUCC 40 MG IJ SOLR
40.0000 mg | Freq: Three times a day (TID) | INTRAMUSCULAR | Status: DC
  Administered 2018-02-23 – 2018-02-24 (×2): 40 mg via INTRAVENOUS
  Filled 2018-02-23 (×2): qty 1

## 2018-02-23 MED ORDER — SODIUM CHLORIDE 0.9 % IV SOLN
1.0000 g | INTRAVENOUS | Status: DC
  Administered 2018-02-23 – 2018-03-01 (×7): 1 g via INTRAVENOUS
  Filled 2018-02-23 (×8): qty 10

## 2018-02-23 MED ORDER — IOPAMIDOL (ISOVUE-370) INJECTION 76%
100.0000 mL | Freq: Once | INTRAVENOUS | Status: AC | PRN
  Administered 2018-02-23: 100 mL via INTRAVENOUS

## 2018-02-23 MED ORDER — DOXYCYCLINE HYCLATE 100 MG PO TABS: 100.0000 mg | ORAL_TABLET | Freq: Two times a day (BID) | ORAL | Status: DC

## 2018-02-23 NOTE — Progress Notes (Signed)
PROGRESS NOTE    Karen Perez  UUV:[id number] DOB: 05/19/1951 DOA: 02/22/2018 PCP: Lyndon Code, MD   Brief Narrative:   HPI: Karen Perez is a 67 y.o. female with history of COPD with ongoing tobacco abuse, cerebral aneurysm status post clipping and coiling, hypertension, diabetes mellitus type 2 was referred to the ER after patient was found to be hypoxic at primary care's office.  Patient had gone for a routine visit when patient was found to be hypoxic.  As per the patient's daughter patient has been increasingly short of breath last few months and gets short of breath with minimal exertion.  Has not had any chest pain fever chills.  Has been having some cough off and on.  In the ER patient is found to be hypoxic and was mildly confused. Chest x-ray shows congestion and cardiomegaly.  EKG shows normal sinus rhythm.  On exam patient has bilateral expiratory wheeze.  Patient was given Solu-Medrol nebulizer treatment for COPD and Lasix for possible CHF. A CTA was done to r/o PE and showed no PE but did show bilateral PNA. She was started on CAP coverage. Continues to wear BiPAP as she could not be weaned to [place] this AM.   Assessment & Plan:   Principal Problem:   Acute respiratory failure with hypoxia (HCC) Active Problems:   OBESITY, MORBID   TOBACCO USER   OBSTRUCTIVE SLEEP APNEA   Essential hypertension   CEREBRAL ANEURYSM   COPD with acute exacerbation (HCC)   Diabetes mellitus type 2 in obese (HCC)   Acute respiratory failure with hypoxia and hypercapnia (HCC)  Acute Respiratory Failure with Hypoxia and Hypercarbia requiring NIPPV with BiPAP likely from multifactorial causes (CAP, COPD, CHF, OSH/OSA) -Place in SDU  -Repeat ABG done this AM after initial showed 7.262/86.2/69.4/37.6/90.5% on BiPAP 16/6 with 60% FiO2 -Repeat ABG to be ordered for 10 AM today   -Patient symptoms likely from a combination of COPD/Sleep apnea and CHF and will need to r/o PE.   -C/w BiPAP as  tolerated and wean to Supplemental O2 as tolerlared.     -D Dimer slightly high at 0.68 so will order CTA of Chest to r/o PE given Hypoxia and Increased WOB; CTA showed no PE but did show Left > Right LL Consolidations, Mild-to-moderate Cardiomegaly, Aortic Atherosclerosis, Mild Upper Lobe Emphysema. And an 18 mm Right Lobe Thyroid Nodule -Continue with nebulizer treatments with Albuterol 2.5 mg/Atrovent 0.5 mg q4h scheduled and q2hprn wheezing along with Budesonide 0.25 mg Neb BID; Hold Home Spiriva and Advair (given that she is on Budesonide)  -Added Abx with po Doxycycline for COPD 100 mg po BID x 5 days but changed to IV Ceftriaxone and IV Azithromycin for CAP coverage  -C/w IV Steroids with IV Solumedrol 40 mg Daily and increase to 40 mg q8h; Given 125 mg Solumedrol in ED -Increase Furosemide IV 40 mg BID to 80 mg BID; -BNP was 45.3 but maybe artificially low in obesity -Checked Transthoracic Echocardiogram and showed EF of 65-70% and G1DD -Strict I's/O's, Daily Weights, SLIV; Patient is -800 mL so far and Weight is not done -Cycled Cardiac Troponins and Negative at <0.03 x3 -Continue to Monitor Respiratory Status Closely and attempt to wean off BiPAP  -Add Flutter Valve and Incentive Spirometry  -If not improving in the next few days will discuss with Pulmonary for further evaluation and Reccomendations  Essential Hypertension  -C/w Home Lisinopril 2.5 mg po Daily -C/w Home Amlodipine 10 mg po Daily (per  report patient not taking) -C/w IV Hydralazine 10 mg IV q4hprn for SBP >140   Diabetes Mellitus Type 2 -Hold home Kombiglyze XR 5-500 mg po Daily  -Patient has been placed on Sensitive Novolog SSI AC -Check HbA1c -Closely follow CBGs since patient is on steroids; Last CBG reading was 163  History of Sleep Apnea -See Above -Patient is noncompliant per Daughters report to admitting physician   Tobacco Abuse -Tobacco cessation counseling requested.  History of Cerebral Aneurysm  status post clipping and coiling with history of stroke  -C/w Atorvastatin 20 mg po Daily and Clopidogrel 75 mg po Daily  Morbid Obesity. -Patient's BMI was 43.89 kg/m2 -Weight Loss Counseling Given   Thyroid Nodule -Noted on CTA of Chest -18 mm on Right Side -Check TSH and FT4 -Outpatient Workup   DVT prophylaxis: Enoxaparin 60 mg sq q24h Code Status: FULL COD Family Communication: No family present at bedside Disposition Plan: Remain Inpatient for further workup and  Consultants:   None   Procedures:  ECHOCARDIOGRAM ------------------------------------------------------------------- Study Conclusions  - Left ventricle: The cavity size was normal. There was severe   concentric hypertrophy. Systolic function was vigorous. The   estimated ejection fraction was in the range of 65% to 70%. Wall   motion was normal; there were no regional wall motion   abnormalities. Doppler parameters are consistent with abnormal   left ventricular relaxation (grade 1 diastolic dysfunction).   There was no evidence of elevated ventricular filling pressure by   Doppler parameters. - Aortic valve: There was no regurgitation. - Aortic root: The aortic root was normal in size. - Mitral valve: There was no regurgitation. - Left atrium: The atrium was mildly dilated. - Right ventricle: Systolic function was normal. - Right atrium: The atrium was normal in size. - Tricuspid valve: There was mild regurgitation. - Pulmonic valve: There was no regurgitation. - Pulmonary arteries: Systolic pressure was within the normal   range. - Inferior vena cava: The vessel was normal in size. - Pericardium, extracardiac: There was no pericardial effusion.   Antimicrobials:  Anti-infectives (From admission, onward)   Start     Dose/Rate Route Frequency Ordered Stop   02/23/18 1430  cefTRIAXone (ROCEPHIN) 1 g in sodium chloride 0.9 % 100 mL IVPB     1 g 200 mL/hr over 30 Minutes Intravenous Every 24  hours 02/23/18 1420     02/23/18 1430  azithromycin (ZITHROMAX) 500 mg in sodium chloride 0.9 % 250 mL IVPB     500 mg 250 mL/hr over 60 Minutes Intravenous Every 24 hours 02/23/18 1420     02/23/18 1000  doxycycline (VIBRA-TABS) tablet 100 mg  Status:  Discontinued     100 mg Oral Every 12 hours 02/23/18 0959 02/23/18 1420     Subjective: Seen and examined on BiPAP and stated she felt slightly better but not great. No CP but remains dyspenic. No lightheadedness or dizziness.   Objective: Vitals:   02/23/18 1315 02/23/18 1345 02/23/18 1400 02/23/18 1415  BP: 117/70 (!) 137/57 (!) 145/79 (!) 141/65  Pulse: 86 72 (!) 56 84  Resp: (!) 24 (!) 24 19 16   Temp:      TempSrc:      SpO2: 93% 98% 91% 98%    Intake/Output Summary (Last 24 hours) at 02/23/2018 1431 Last data filed at 02/23/2018 0955 Gross per 24 hour  Intake -  Output 800 ml  Net -800 ml   There were no vitals filed for this visit.  Examination: Physical  Exam:  Constitutional: WN/WD morbidly obese AAF in Respiratory distress but comfortable on BiPAP Eyes: Lids and conjunctivae normal, sclerae anicteric  ENMT: External Ears, Nose appear normal. Grossly normal hearing. Mucous membranes are moist. .  Neck: Appears normal, supple, no cervical masses, normal ROM, no appreciable thyromegaly Respiratory: Diminished to auscultation bilaterally at the bases with some expiratory wheezing; No appreciable rales but had crackles. Increased respiratory effort and patient is not tachypenic. No accessory muscle use.  Cardiovascular: Slightly tachycardic but regular rhythm, no murmurs / rubs / gallops. S1 and S2 auscultated. 2+ LE Edema Abdomen: Soft, non-tender, Distended due to body habitus. No masses palpated. No appreciable hepatosplenomegaly. Bowel sounds positive x4.  GU: Deferred. Musculoskeletal: No clubbing / cyanosis of digits/nails. No joint deformity upper and lower extremities.  Skin: No rashes on a limited skin evaluation.  No induration; Warm and dry.  Neurologic: CN 2-12 grossly intact with no focal deficits. Romberg sign and cerebellar reflexes not assessed.  Psychiatric: Normal judgment and insight. Alert and oriented x 3. Normal mood and appropriate affect.   Data Reviewed: I have personally reviewed following labs and imaging studies  CBC: Recent Labs  Lab 02/22/18 1310 02/23/18 0125 02/23/18 0545  WBC 6.0 7.6 6.0  HGB 14.3 14.6 14.5  HCT 46.2* 48.0* 48.1*  MCV 98.5 98.4 99.0  PLT 205 209 210   Basic Metabolic Panel: Recent Labs  Lab 02/22/18 1310 02/23/18 0125 02/23/18 0545  NA 139  --  141  K 4.0  --  4.3  CL 101  --  98*  CO2 31  --  30  GLUCOSE 107*  --  167*  BUN 6  --  6  CREATININE 0.67 0.70 0.76  CALCIUM 8.7*  --  8.6*   GFR: Estimated Creatinine Clearance: 93.3 mL/min (by C-G formula based on SCr of 0.76 mg/dL). Liver Function Tests: No results for input(s): AST, ALT, ALKPHOS, BILITOT, PROT, ALBUMIN in the last 168 hours. No results for input(s): LIPASE, AMYLASE in the last 168 hours. No results for input(s): AMMONIA in the last 168 hours. Coagulation Profile: No results for input(s): INR, PROTIME in the last 168 hours. Cardiac Enzymes: Recent Labs  Lab 02/23/18 0125 02/23/18 0545 02/23/18 1137  TROPONINI <0.03 <0.03 <0.03   BNP (last 3 results) No results for input(s): PROBNP in the last 8760 hours. HbA1C: No results for input(s): HGBA1C in the last 72 hours. CBG: Recent Labs  Lab 02/23/18 0813  GLUCAP 163*   Lipid Profile: No results for input(s): CHOL, HDL, LDLCALC, TRIG, CHOLHDL, LDLDIRECT in the last 72 hours. Thyroid Function Tests: Recent Labs    02/23/18 0125  TSH 0.415   Anemia Panel: No results for input(s): VITAMINB12, FOLATE, FERRITIN, TIBC, IRON, RETICCTPCT in the last 72 hours. Sepsis Labs: No results for input(s): PROCALCITON, LATICACIDVEN in the last 168 hours.  No results found for this or any previous visit (from the past 240  hour(s)).   Radiology Studies: Dg Chest 2 View  Result Date: 02/22/2018 CLINICAL DATA:  67 year old female with shortness of breath. Lower extremity swelling for 1 week. EXAM: CHEST - 2 VIEW COMPARISON:  09/09/2016 and earlier. FINDINGS: No layering pleural effusion, but there is a small volume of fluid along the right minor fissure which is new. Mild-to-moderate cardiomegaly appears increased since 04/02/16. Other mediastinal contours are within normal limits. Visualized tracheal air column is within normal limits. No pneumothorax or consolidation. Diffuse pulmonary vascular congestion, more pronounced than on the 2016/04/02 comparison. No acute  osseous abnormality identified. Negative visible bowel gas pattern. IMPRESSION: 1. Pulmonary interstitial edema with small volume pleural fluid in the right minor fissure. 2. Mild-to-moderate cardiomegaly may have increased since 2017. Electronically Signed   By: Odessa FlemingH  Hall M.D.   On: 02/22/2018 14:45   Ct Angio Chest Pe W Or Wo Contrast  Result Date: 02/23/2018 CLINICAL DATA:  67 y/o F; shortness of breath and back pain with breathing. EXAM: CT ANGIOGRAPHY CHEST WITH CONTRAST TECHNIQUE: Multidetector CT imaging of the chest was performed using the standard protocol during bolus administration of intravenous contrast. Multiplanar CT image reconstructions and MIPs were obtained to evaluate the vascular anatomy. CONTRAST:  100mL ISOVUE-370 IOPAMIDOL (ISOVUE-370) INJECTION 76% COMPARISON:  02/22/2018 chest radiograph FINDINGS: Cardiovascular: Mild-to-moderate cardiomegaly. No pericardial effusion. Normal caliber thoracic aorta with calcific atherosclerosis. Satisfactory opacification of the pulmonary arteries. Mediastinum/Nodes: 18 mm nodule within the right lobe of the thyroid. Normal esophagus. No mediastinal adenopathy. Lungs/Pleura: Mild centrilobular emphysema in the upper lobes. Left-greater-than-right lower lobe consolidations with peribronchial thickening and mucous  plugging. No pleural effusion. Upper Abdomen: No acute abnormality. Musculoskeletal: No chest wall abnormality. No acute or significant osseous findings. Review of the MIP images confirms the above findings. IMPRESSION: 1. No pulmonary embolus identified. 2. Left-greater-than-right lower lobe consolidations, likely pneumonia. 3. Mild-to-moderate cardiomegaly and aortic atherosclerosis. 4. Mild upper lobe emphysema. 5. 18 mm nodule in right lobe of thyroid. Thyroid ultrasound is recommended on a nonemergent basis. Electronically Signed   By: Mitzi HansenLance  Furusawa-Stratton M.D.   On: 02/23/2018 13:43   Scheduled Meds: . albuterol  2.5 mg Nebulization Q4H  . amLODipine  10 mg Oral Daily  . atorvastatin  20 mg Oral Daily  . budesonide (PULMICORT) nebulizer solution  0.25 mg Nebulization BID  . clopidogrel  75 mg Oral Daily  . enoxaparin (LOVENOX) injection  60 mg Subcutaneous Q24H  . furosemide  80 mg Intravenous Q12H  . insulin aspart  0-9 Units Subcutaneous TID WC  . iopamidol      . ipratropium  0.5 mg Nebulization Q4H  . lisinopril  2.5 mg Oral Daily  . methylPREDNISolone (SOLU-MEDROL) injection  40 mg Intravenous Q8H   Continuous Infusions: . azithromycin    . cefTRIAXone (ROCEPHIN)  IV      LOS: 1 day   Merlene Laughtermair Latif Jawanna Dykman, DO [facility] Pager [phone]  If 7PM-7AM, please contact night-coverage [email or web address] Password [email or web address] 02/23/2018, 2:31 PM

## 2018-02-23 NOTE — ED Notes (Signed)
Breakfast ordered 

## 2018-02-23 NOTE — Progress Notes (Signed)
Notified Dr.Kakrakandy of arterial blood gas results. Placed patient on Bipap and will repeat arterial gas in two hours.

## 2018-02-23 NOTE — ED Notes (Signed)
Pt transported off bipap on [place] 5L with RT and bipap machine if needed. Patient in no distress.

## 2018-02-23 NOTE — ED Notes (Addendum)
Pt returned from scanner. No distress noted; bed wet; sheets changed and hooked back up to [place]

## 2018-02-23 NOTE — ED Notes (Addendum)
Pt off BIPAP and eating lunch; on 5LNC sats at 92-94%. Respiratory running ABG

## 2018-02-23 NOTE — ED Notes (Signed)
MD paged over pts O2. Respiratory at bedside to put pt on Bi pap

## 2018-02-23 NOTE — ED Notes (Addendum)
CT notified that patient has IV for CT angio. Transport came to get pt but RT not available.

## 2018-02-23 NOTE — ED Notes (Addendum)
Report given to floor nurse: RT called to transport

## 2018-02-23 NOTE — Progress Notes (Signed)
RT attempted to take patient off BiPAP and placed on 5LNC. Patient did not tolerate. Placed back on BiPAP

## 2018-02-23 NOTE — Progress Notes (Signed)
  Echocardiogram 2D Echocardiogram has been performed.  Roosvelt MaserLane, [place] Derocher F 02/23/2018, 1:25 PM

## 2018-02-23 NOTE — ED Notes (Addendum)
Family at bedside updated on POC. Concerned about her not eating but RN provided education on importance of her staying on BIPAP and cannot come off at this time. RN reassured them, will try to ween as quickly as possible when it is safe for her; family appreciative.

## 2018-02-23 NOTE — ED Notes (Signed)
RN and RT attempted to try BIPAP break- patient dipped to 82-85% on 5 L [place]. Patient appeared to be in no distress; water given. Patient could not maintain sat level appropriately, and, thus,  placed back on BIPAP by RT

## 2018-02-23 NOTE — ED Notes (Signed)
Pt resting with BIPAP mask in place and no evidence of distress.

## 2018-02-24 ENCOUNTER — Inpatient Hospital Stay (HOSPITAL_COMMUNITY): Payer: Medicare HMO

## 2018-02-24 DIAGNOSIS — J189 Pneumonia, unspecified organism: Secondary | ICD-10-CM

## 2018-02-24 LAB — BLOOD GAS, ARTERIAL
Acid-Base Excess: 16.5 mmol/L — ABNORMAL HIGH (ref 0.0–2.0)
Bicarbonate: 42.3 mmol/L — ABNORMAL HIGH (ref 20.0–28.0)
Drawn by: 36529
O2 Content: 4 L/min
O2 Saturation: 81 %
PCO2 ART: 71.1 mmHg — AB (ref 32.0–48.0)
PO2 ART: 47.4 mmHg — AB (ref 83.0–108.0)
Patient temperature: 98.6
pH, Arterial: 7.392 (ref 7.350–7.450)

## 2018-02-24 LAB — RESPIRATORY PANEL BY PCR
Adenovirus: NOT DETECTED
BORDETELLA PERTUSSIS-RVPCR: NOT DETECTED
Chlamydophila pneumoniae: NOT DETECTED
Coronavirus 229E: NOT DETECTED
Coronavirus HKU1: NOT DETECTED
Coronavirus NL63: NOT DETECTED
Coronavirus OC43: NOT DETECTED
INFLUENZA A-RVPPCR: NOT DETECTED
INFLUENZA B-RVPPCR: NOT DETECTED
Metapneumovirus: NOT DETECTED
Mycoplasma pneumoniae: NOT DETECTED
PARAINFLUENZA VIRUS 2-RVPPCR: NOT DETECTED
PARAINFLUENZA VIRUS 3-RVPPCR: NOT DETECTED
Parainfluenza Virus 1: NOT DETECTED
Parainfluenza Virus 4: NOT DETECTED
RESPIRATORY SYNCYTIAL VIRUS-RVPPCR: NOT DETECTED
RHINOVIRUS / ENTEROVIRUS - RVPPCR: NOT DETECTED

## 2018-02-24 LAB — CBC WITH DIFFERENTIAL/PLATELET
Basophils Absolute: 0 10*3/uL (ref 0.0–0.1)
Basophils Relative: 0 %
EOS ABS: 0 10*3/uL (ref 0.0–0.7)
EOS PCT: 0 %
HCT: 46.3 % — ABNORMAL HIGH (ref 36.0–46.0)
Hemoglobin: 14 g/dL (ref 12.0–15.0)
LYMPHS ABS: 0.7 10*3/uL (ref 0.7–4.0)
Lymphocytes Relative: 11 %
MCH: 30 pg (ref 26.0–34.0)
MCHC: 30.2 g/dL (ref 30.0–36.0)
MCV: 99.1 fL (ref 78.0–100.0)
MONO ABS: 0.3 10*3/uL (ref 0.1–1.0)
MONOS PCT: 4 %
Neutro Abs: 5.8 10*3/uL (ref 1.7–7.7)
Neutrophils Relative %: 85 %
PLATELETS: 223 10*3/uL (ref 150–400)
RBC: 4.67 MIL/uL (ref 3.87–5.11)
RDW: 15.1 % (ref 11.5–15.5)
WBC: 6.7 10*3/uL (ref 4.0–10.5)

## 2018-02-24 LAB — INFLUENZA PANEL BY PCR (TYPE A & B)
INFLAPCR: NEGATIVE
Influenza B By PCR: NEGATIVE

## 2018-02-24 LAB — COMPREHENSIVE METABOLIC PANEL
ALT: 12 U/L — AB (ref 14–54)
ANION GAP: 10 (ref 5–15)
AST: 13 U/L — ABNORMAL LOW (ref 15–41)
Albumin: 3.8 g/dL (ref 3.5–5.0)
Alkaline Phosphatase: 59 U/L (ref 38–126)
BUN: 12 mg/dL (ref 6–20)
CHLORIDE: 94 mmol/L — AB (ref 101–111)
CO2: 37 mmol/L — ABNORMAL HIGH (ref 22–32)
CREATININE: 0.7 mg/dL (ref 0.44–1.00)
Calcium: 8.6 mg/dL — ABNORMAL LOW (ref 8.9–10.3)
Glucose, Bld: 137 mg/dL — ABNORMAL HIGH (ref 65–99)
Potassium: 4 mmol/L (ref 3.5–5.1)
Sodium: 141 mmol/L (ref 135–145)
Total Bilirubin: 0.6 mg/dL (ref 0.3–1.2)
Total Protein: 7.1 g/dL (ref 6.5–8.1)

## 2018-02-24 LAB — GLUCOSE, CAPILLARY
GLUCOSE-CAPILLARY: 122 mg/dL — AB (ref 65–99)
GLUCOSE-CAPILLARY: 166 mg/dL — AB (ref 65–99)
Glucose-Capillary: 130 mg/dL — ABNORMAL HIGH (ref 65–99)
Glucose-Capillary: 116 mg/dL — ABNORMAL HIGH (ref 65–99)

## 2018-02-24 LAB — MAGNESIUM: MAGNESIUM: 2.4 mg/dL (ref 1.7–2.4)

## 2018-02-24 LAB — T4, FREE: FREE T4: 0.88 ng/dL (ref 0.61–1.12)

## 2018-02-24 LAB — PHOSPHORUS: PHOSPHORUS: 4.1 mg/dL (ref 2.5–4.6)

## 2018-02-24 MED ORDER — IPRATROPIUM-ALBUTEROL 0.5-2.5 (3) MG/3ML IN SOLN
3.0000 mL | RESPIRATORY_TRACT | Status: DC
  Administered 2018-02-24 – 2018-02-25 (×11): 3 mL via RESPIRATORY_TRACT
  Filled 2018-02-24 (×10): qty 3

## 2018-02-24 MED ORDER — METHYLPREDNISOLONE SODIUM SUCC 125 MG IJ SOLR
60.0000 mg | Freq: Three times a day (TID) | INTRAMUSCULAR | Status: DC
  Administered 2018-02-24 – 2018-02-25 (×3): 60 mg via INTRAVENOUS
  Filled 2018-02-24 (×3): qty 2

## 2018-02-24 MED ORDER — CALCIUM CARBONATE ANTACID 500 MG PO CHEW: 1.0000 | CHEWABLE_TABLET | Freq: Three times a day (TID) | ORAL | Status: DC

## 2018-02-24 MED ORDER — AZITHROMYCIN 250 MG PO TABS
500.0000 mg | ORAL_TABLET | Freq: Every day | ORAL | Status: AC
  Administered 2018-02-24 – 2018-02-27 (×4): 500 mg via ORAL
  Filled 2018-02-24 (×4): qty 2

## 2018-02-24 MED ORDER — GUAIFENESIN ER 600 MG PO TB12
1200.0000 mg | ORAL_TABLET | Freq: Two times a day (BID) | ORAL | Status: DC
  Administered 2018-02-24 – 2018-03-02 (×13): 1200 mg via ORAL
  Filled 2018-02-24 (×16): qty 2

## 2018-02-24 NOTE — Progress Notes (Signed)
RT placed patient on CPAP mode on the V60 BIPAP. Patient is tolerating well at this time. RT will monitor as needed.

## 2018-02-24 NOTE — Progress Notes (Signed)
PROGRESS NOTE    Karen Perez  WUJ:[id number] DOB: [birth year] DOA: 02/22/2018 PCP: Lyndon Code, MD   Brief Narrative:   HPI: Karen Perez is a 67 y.o. female with history of COPD with ongoing tobacco abuse, cerebral aneurysm status post clipping and coiling, hypertension, diabetes mellitus type 2 was referred to the ER after patient was found to be hypoxic at primary care's office.  Patient had gone for a routine visit when patient was found to be hypoxic.  As per the patient's daughter patient has been increasingly short of breath last few months and gets short of breath with minimal exertion.  Has not had any chest pain fever chills.  Has been having some cough off and on.  In the ER patient is found to be hypoxic and was mildly confused. Chest x-ray shows congestion and cardiomegaly.  EKG shows normal sinus rhythm.  On exam patient has bilateral expiratory wheeze.  Patient was given Solu-Medrol nebulizer treatment for COPD and Lasix for possible CHF. A CTA was done to r/o PE and showed no PE but did show bilateral PNA. She was started on CAP coverage. Continued to wear BiPAP as she could not be weaned to [facility] yesterday AM but is on 5 Liters of O2 via [place] today. She is continued to be diuresed and treated for PNA, COPD, and CHF.   Assessment & Plan:   Principal Problem:   Acute respiratory failure with hypoxia (HCC) Active Problems:   OBESITY, MORBID   TOBACCO USER   OBSTRUCTIVE SLEEP APNEA   Essential hypertension   CEREBRAL ANEURYSM   COPD with acute exacerbation (HCC)   Diabetes mellitus type 2 in obese (HCC)   Acute respiratory failure with hypoxia and hypercapnia (HCC)  Acute Respiratory Failure with Hypoxia and Hypercarbia requiring NIPPV with BiPAP likely from multifactorial causes (CAP, COPD, CHF, OSH/OSA) -Placed in SDU  -Repeat ABG done today showed 7.392/71.1/47.4/42.5/81.0% -Patient symptoms likely from a combination of CAP, COPD/Sleep apnea and CHF; PE ruled out.     -C/w BiPAP as tolerated and wean to Supplemental O2 as tolerlared.     -D Dimer slightly high at 0.68 ordered CTA of Chest to r/o PE given Hypoxia and Increased WOB; CTA showed no PE but did show Left > Right LL Consolidations, Mild-to-moderate Cardiomegaly, Aortic Atherosclerosis, Mild Upper Lobe Emphysema. And an 18 mm Right Lobe Thyroid Nodule -Continue with nebulizer treatments with DuoNeb 3 mL q4h and q2hprn wheezing along with Budesonide 0.25 mg Neb BID; Hold Home Spiriva and Advair (given that she is on Budesonide)  -Added Abx with po Doxycycline for COPD 100 mg po BID x 5 days but changed to IV Ceftriaxone and IV Azithromycin for CAP coverage yesterday given CAP -C/w IV Solumedrol increased from 40 mg q8h to 60 mg q8h; Given 125 mg Solumedrol in ED -Increase Furosemide IV 40 mg BID to 80 mg BID; -BNP was 45.3 but maybe artificially low in obesity -Checked Transthoracic Echocardiogram and showed EF of 65-70% and G1DD -Strict I's/O's, Daily Weights, SLIV; Patient is -2,990 mL so far and Weight is down 4 lbs -Cycled Cardiac Troponins and Negative at <0.03 x3 -Continue to Monitor Respiratory Status Closely and continue BiPAP qHS and PRN -Checked Respiratory Virus Panel and Influenza A/B PCR and was Negative  -Maintain O2 Saturations >92% and Continue to Wean O2 requirements -Add Flutter Valve and Incentive Spirometry  -Repeat CXR this AM showed Enlargement of cardiac silhouette with pulmonary vascular congestion. Increased LEFT lower lobe consolidation  with persistent mild atelectasis versus consolidation at RIGHT base. -If not improving in the next few days will discuss with Pulmonary for further evaluation and Reccomendations  Essential Hypertension  -C/w Home Lisinopril 2.5 mg po Daily -C/w Home Amlodipine 10 mg po Daily (per report patient not taking) -C/w IV Hydralazine 10 mg IV q4hprn for SBP >140   Diabetes Mellitus Type 2 -Hold home Kombiglyze XR 5-500 mg po Daily  -Patient has  been placed on Sensitive Novolog SSI AC -Checked HbA1c and was 5.6 -Closely follow CBGs since patient is on steroids; CBG's ranging from 116-130  History of Sleep Apnea -See Above -Patient is noncompliant per Daughters report to admitting physician   Tobacco Abuse -Tobacco cessation counseling requested. -Will discuss with patient about Nicotine Patch  History of Cerebral Aneurysm status post clipping and coiling with history of stroke  -C/w Atorvastatin 20 mg po Daily and Clopidogrel 75 mg po Daily  Morbid Obesity. -Patient's BMI was 43.89 kg/m2 -Weight Loss Counseling Given   Thyroid Nodule -Noted on CTA of Chest -18 mm on Right Side -Checked TSH and was 0.415 and FT4 was 0.88 -Outpatient Workup   DVT prophylaxis: Enoxaparin 60 mg sq q24h Code Status: FULL COD Family Communication: Discussed with Daughter at bedside Disposition Plan: Remain Inpatient for further workup and  Consultants:   None   Procedures:  ECHOCARDIOGRAM ------------------------------------------------------------------- Study Conclusions  - Left ventricle: The cavity size was normal. There was severe   concentric hypertrophy. Systolic function was vigorous. The   estimated ejection fraction was in the range of 65% to 70%. Wall   motion was normal; there were no regional wall motion   abnormalities. Doppler parameters are consistent with abnormal   left ventricular relaxation (grade 1 diastolic dysfunction).   There was no evidence of elevated ventricular filling pressure by   Doppler parameters. - Aortic valve: There was no regurgitation. - Aortic root: The aortic root was normal in size. - Mitral valve: There was no regurgitation. - Left atrium: The atrium was mildly dilated. - Right ventricle: Systolic function was normal. - Right atrium: The atrium was normal in size. - Tricuspid valve: There was mild regurgitation. - Pulmonic valve: There was no regurgitation. - Pulmonary arteries:  Systolic pressure was within the normal   range. - Inferior vena cava: The vessel was normal in size. - Pericardium, extracardiac: There was no pericardial effusion.   Antimicrobials:  Anti-infectives (From admission, onward)   Start     Dose/Rate Route Frequency Ordered Stop   02/23/18 1500  cefTRIAXone (ROCEPHIN) 1 g in sodium chloride 0.9 % 100 mL IVPB     1 g 200 mL/hr over 30 Minutes Intravenous Every 24 hours 02/23/18 1420     02/23/18 1500  azithromycin (ZITHROMAX) 500 mg in sodium chloride 0.9 % 250 mL IVPB     500 mg 250 mL/hr over 60 Minutes Intravenous Every 24 hours 02/23/18 1420     02/23/18 1000  doxycycline (VIBRA-TABS) tablet 100 mg  Status:  Discontinued     100 mg Oral Every 12 hours 02/23/18 0959 02/23/18 1420     Subjective: Seen and examined and had been transitioned off of BiPAP to [place]. Still SOB but improved. No CP. No lightheadedness or dizziness.   Objective: Vitals:   02/24/18 0400 02/24/18 0440 02/24/18 0500 02/24/18 0711  BP:  134/68  (!) 151/74  Pulse: 83 79  71  Resp: 17 (!) 21  18  Temp:  98.2 F (36.8 C)  TempSrc:  Oral    SpO2: 97% 100%  96%  Weight:   124.8 kg (275 lb 2.1 oz)   Height:        Intake/Output Summary (Last 24 hours) at 02/24/2018 0735 Last data filed at 02/23/2018 2000 Gross per 24 hour  Intake 350 ml  Output 1650 ml  Net -1300 ml   Filed Weights   02/23/18 1524 02/24/18 0500  Weight: 126.8 kg (279 lb 8.7 oz) 124.8 kg (275 lb 2.1 oz)   Examination: Physical Exam:  Constitutional: WN/WD morbidly obese AAF in NAD and weaned off of BiPAP Eyes: Sclerae anicteric; Lids normal ENMT: External Ears and nose appear normal. MMM Neck: Supple with mild JVD Respiratory: Diminished to auscultation bilaterally with diffuse wheezing and crackles. Slightly tachypenic but no accessory muscle use Cardiovascular: RRR;  No m/r/g. 1-2+ LE edema Abdomen: Soft, NT, Distended due to body habitus. Bowel sounds present  GU:  Deferred Musculoskeletal: No contractures; No cyanosis Skin: No rashes on a limited skin evaluation; Warm and Dry Neurologic: CN 2-12 grossly intact; No appreciable focal deficits Psychiatric: Normal mood and affect. Intact judgement and insight  Data Reviewed: I have personally reviewed following labs and imaging studies  CBC: Recent Labs  Lab 02/22/18 1310 02/23/18 0125 02/23/18 0545 02/24/18 0350  WBC 6.0 7.6 6.0 6.7  NEUTROABS  --   --   --  5.8  HGB 14.3 14.6 14.5 14.0  HCT 46.2* 48.0* 48.1* 46.3*  MCV 98.5 98.4 99.0 99.1  PLT 205 209 210 223   Basic Metabolic Panel: Recent Labs  Lab 02/22/18 1310 02/23/18 0125 02/23/18 0545 02/24/18 0350  NA 139  --  141 141  K 4.0  --  4.3 4.0  CL 101  --  98* 94*  CO2 31  --  30 37*  GLUCOSE 107*  --  167* 137*  BUN 6  --  6 12  CREATININE 0.67 0.70 0.76 0.70  CALCIUM 8.7*  --  8.6* 8.6*  MG  --   --   --  2.4  PHOS  --   --   --  4.1   GFR: Estimated Creatinine Clearance: 92.3 mL/min (by C-G formula based on SCr of 0.7 mg/dL). Liver Function Tests: Recent Labs  Lab 02/24/18 0350  AST 13*  ALT 12*  ALKPHOS 59  BILITOT 0.6  PROT 7.1  ALBUMIN 3.8   No results for input(s): LIPASE, AMYLASE in the last 168 hours. No results for input(s): AMMONIA in the last 168 hours. Coagulation Profile: No results for input(s): INR, PROTIME in the last 168 hours. Cardiac Enzymes: Recent Labs  Lab 02/23/18 0125 02/23/18 0545 02/23/18 1137  TROPONINI <0.03 <0.03 <0.03   BNP (last 3 results) No results for input(s): PROBNP in the last 8760 hours. HbA1C: Recent Labs    02/23/18 1450  HGBA1C 5.6   CBG: Recent Labs  Lab 02/23/18 0813 02/23/18 1731 02/23/18 2039  GLUCAP 163* 143* 125*   Lipid Profile: No results for input(s): CHOL, HDL, LDLCALC, TRIG, CHOLHDL, LDLDIRECT in the last 72 hours. Thyroid Function Tests: Recent Labs    02/23/18 0125  TSH 0.415   Anemia Panel: No results for input(s): VITAMINB12,  FOLATE, FERRITIN, TIBC, IRON, RETICCTPCT in the last 72 hours. Sepsis Labs: No results for input(s): PROCALCITON, LATICACIDVEN in the last 168 hours.  Recent Results (from the past 240 hour(s))  MRSA PCR Screening     Status: None   Collection Time: 02/23/18  3:16 PM  Result Value Ref Range Status   MRSA by PCR NEGATIVE NEGATIVE Final    Comment:        The GeneXpert MRSA Assay (FDA approved for NASAL specimens only), is one component of a comprehensive MRSA colonization surveillance program. It is not intended to diagnose MRSA infection nor to guide or monitor treatment for MRSA infections. Performed at [facility], [address] [place], [place] [address]     Radiology Studies: Dg Chest 2 View  Result Date: 02/22/2018 CLINICAL DATA:  67 year old female with shortness of breath. Lower extremity swelling for 1 week. EXAM: CHEST - 2 VIEW COMPARISON:  09/09/2016 and earlier. FINDINGS: No layering pleural effusion, but there is a small volume of fluid along the right minor fissure which is new. Mild-to-moderate cardiomegaly appears increased since 2016/03/25. Other mediastinal contours are within normal limits. Visualized tracheal air column is within normal limits. No pneumothorax or consolidation. Diffuse pulmonary vascular congestion, more pronounced than on the 03/25/16 comparison. No acute osseous abnormality identified. Negative visible bowel gas pattern. IMPRESSION: 1. Pulmonary interstitial edema with small volume pleural fluid in the right minor fissure. 2. Mild-to-moderate cardiomegaly may have increased since 2016-03-25. Electronically Signed   By: Odessa Fleming M.D.   On: 02/22/2018 14:45   Ct Angio Chest Pe W Or Wo Contrast  Result Date: 02/23/2018 CLINICAL DATA:  68 y/o F; shortness of breath and back pain with breathing. EXAM: CT ANGIOGRAPHY CHEST WITH CONTRAST TECHNIQUE: Multidetector CT imaging of the chest was performed using the standard protocol during bolus administration of  intravenous contrast. Multiplanar CT image reconstructions and MIPs were obtained to evaluate the vascular anatomy. CONTRAST:  ISOVUE-370 IOPAMIDOL (ISOVUE-370) INJECTION 76% COMPARISON:  02/22/2018 chest radiograph FINDINGS: Cardiovascular: Mild-to-moderate cardiomegaly. No pericardial effusion. Normal caliber thoracic aorta with calcific atherosclerosis. Satisfactory opacification of the pulmonary arteries. Mediastinum/Nodes: 18 mm nodule within the right lobe of the thyroid. Normal esophagus. No mediastinal adenopathy. Lungs/Pleura: Mild centrilobular emphysema in the upper lobes. Left-greater-than-right lower lobe consolidations with peribronchial thickening and mucous plugging. No pleural effusion. Upper Abdomen: No acute abnormality. Musculoskeletal: No chest wall abnormality. No acute or significant osseous findings. Review of the MIP images confirms the above findings. IMPRESSION: 1. No pulmonary embolus identified. 2. Left-greater-than-right lower lobe consolidations, likely pneumonia. 3. Mild-to-moderate cardiomegaly and aortic atherosclerosis. 4. Mild upper lobe emphysema. 5. 18 mm nodule in right lobe of thyroid. Thyroid ultrasound is recommended on a nonemergent basis. Electronically Signed   By: Mitzi Hansen M.D.   On: 02/23/2018 13:43   Scheduled Meds: . amLODipine  10 mg Oral Daily  . atorvastatin  20 mg Oral Daily  . budesonide (PULMICORT) nebulizer solution  0.25 mg Nebulization BID  . clopidogrel  75 mg Oral Daily  . enoxaparin (LOVENOX) injection  60 mg Subcutaneous Q24H  . furosemide  80 mg Intravenous Q12H  . insulin aspart  0-9 Units Subcutaneous TID WC  . ipratropium-albuterol  3 mL Nebulization Q4H  . lisinopril  2.5 mg Oral Daily  . methylPREDNISolone (SOLU-MEDROL) injection  40 mg Intravenous Q8H   Continuous Infusions: . azithromycin Stopped (02/23/18 1838)  . cefTRIAXone (ROCEPHIN)  IV Stopped (02/23/18 1700)    LOS: 2 days   Merlene Laughter,  DO [facility] Pager [phone]  If 7PM-7AM, please contact night-coverage [email or web address] Password [email or web address] 02/24/2018, 7:35 AM

## 2018-02-25 ENCOUNTER — Inpatient Hospital Stay (HOSPITAL_COMMUNITY): Payer: Medicare HMO

## 2018-02-25 DIAGNOSIS — I5031 Acute diastolic (congestive) heart failure: Secondary | ICD-10-CM

## 2018-02-25 LAB — CBC WITH DIFFERENTIAL/PLATELET
Basophils Absolute: 0 10*3/uL (ref 0.0–0.1)
Basophils Relative: 0 %
EOS ABS: 0 10*3/uL (ref 0.0–0.7)
EOS PCT: 0 %
HCT: 47.9 % — ABNORMAL HIGH (ref 36.0–46.0)
Hemoglobin: 14.6 g/dL (ref 12.0–15.0)
LYMPHS ABS: 0.8 10*3/uL (ref 0.7–4.0)
LYMPHS PCT: 10 %
MCH: 29.8 pg (ref 26.0–34.0)
MCHC: 30.5 g/dL (ref 30.0–36.0)
MCV: 97.8 fL (ref 78.0–100.0)
Monocytes Absolute: 0.4 10*3/uL (ref 0.1–1.0)
Monocytes Relative: 5 %
Neutro Abs: 6.7 10*3/uL (ref 1.7–7.7)
Neutrophils Relative %: 85 %
PLATELETS: 210 10*3/uL (ref 150–400)
RBC: 4.9 MIL/uL (ref 3.87–5.11)
RDW: 14.6 % (ref 11.5–15.5)
WBC: 8 10*3/uL (ref 4.0–10.5)

## 2018-02-25 LAB — COMPREHENSIVE METABOLIC PANEL
ALK PHOS: 53 U/L (ref 38–126)
ALT: 13 U/L — AB (ref 14–54)
ANION GAP: 11 (ref 5–15)
AST: 14 U/L — ABNORMAL LOW (ref 15–41)
Albumin: 3.6 g/dL (ref 3.5–5.0)
BUN: 17 mg/dL (ref 6–20)
CALCIUM: 8.7 mg/dL — AB (ref 8.9–10.3)
CHLORIDE: 91 mmol/L — AB (ref 101–111)
CO2: 37 mmol/L — AB (ref 22–32)
CREATININE: 0.74 mg/dL (ref 0.44–1.00)
Glucose, Bld: 149 mg/dL — ABNORMAL HIGH (ref 65–99)
Potassium: 3.5 mmol/L (ref 3.5–5.1)
SODIUM: 139 mmol/L (ref 135–145)
Total Bilirubin: 0.5 mg/dL (ref 0.3–1.2)
Total Protein: 7.2 g/dL (ref 6.5–8.1)

## 2018-02-25 LAB — MAGNESIUM: MAGNESIUM: 2.4 mg/dL (ref 1.7–2.4)

## 2018-02-25 LAB — GLUCOSE, CAPILLARY
GLUCOSE-CAPILLARY: 121 mg/dL — AB (ref 65–99)
GLUCOSE-CAPILLARY: 169 mg/dL — AB (ref 65–99)
Glucose-Capillary: 173 mg/dL — ABNORMAL HIGH (ref 65–99)
Glucose-Capillary: 153 mg/dL — ABNORMAL HIGH (ref 65–99)

## 2018-02-25 LAB — PHOSPHORUS: PHOSPHORUS: 3.9 mg/dL (ref 2.5–4.6)

## 2018-02-25 MED ORDER — TRAMADOL HCL 50 MG PO TABS
50.0000 mg | ORAL_TABLET | Freq: Four times a day (QID) | ORAL | Status: DC | PRN
  Administered 2018-02-25 – 2018-02-28 (×5): 50 mg via ORAL
  Filled 2018-02-25 (×6): qty 1

## 2018-02-25 MED ORDER — ACETAMINOPHEN ER 650 MG PO TBCR: 650.0000 mg | EXTENDED_RELEASE_TABLET | Freq: Three times a day (TID) | ORAL | Status: DC | PRN

## 2018-02-25 MED ORDER — IPRATROPIUM-ALBUTEROL 0.5-2.5 (3) MG/3ML IN SOLN
3.0000 mL | Freq: Four times a day (QID) | RESPIRATORY_TRACT | Status: DC
  Administered 2018-02-26 – 2018-02-27 (×5): 3 mL via RESPIRATORY_TRACT
  Filled 2018-02-25 (×4): qty 3

## 2018-02-25 MED ORDER — METHYLPREDNISOLONE SODIUM SUCC 125 MG IJ SOLR
60.0000 mg | Freq: Two times a day (BID) | INTRAMUSCULAR | Status: DC
  Administered 2018-02-25 – 2018-02-26 (×2): 60 mg via INTRAVENOUS
  Filled 2018-02-25 (×2): qty 2

## 2018-02-25 MED ORDER — PANTOPRAZOLE SODIUM 40 MG PO TBEC
40.0000 mg | DELAYED_RELEASE_TABLET | Freq: Every day | ORAL | Status: DC
  Administered 2018-02-25 – 2018-03-02 (×6): 40 mg via ORAL
  Filled 2018-02-25 (×6): qty 1

## 2018-02-25 MED ORDER — NICOTINE 14 MG/24HR TD PT24
14.0000 mg | MEDICATED_PATCH | Freq: Every day | TRANSDERMAL | Status: DC
  Administered 2018-02-25 – 2018-03-02 (×6): 14 mg via TRANSDERMAL
  Filled 2018-02-25 (×6): qty 1

## 2018-02-25 NOTE — Care Management Important Message (Signed)
Important Message  Patient Details  Name: Karen PolingLinda J Perez MRN: [id number] Date of Birth: 12/11/1950   Medicare Important Message Given:  Yes    Dorena BodoIris Arsh Feutz 02/25/2018, 1:56 PM

## 2018-02-25 NOTE — NC FL2 (Signed)
[place] MEDICAID FL2 LEVEL OF CARE SCREENING TOOL     IDENTIFICATION  Patient Name: WINONA SISON Birthdate: 04/19/1951 Sex: female Admission Date (Current Location): 02/22/2018  [facility] and [place] Number:  Producer, television/film/video and Address:  [facility], [address] [place], [place] [address]      Provider Number: [id number]  Attending Physician Name and Address:  Merlene Laughter, DO  Relative Name and Phone Number:       Current Level of Care: Hospital Recommended Level of Care: Skilled Nursing Facility Prior Approval Number:    Date Approved/Denied:   PASRR Number: [id number]  Discharge Plan: SNF    Current Diagnoses: Patient Active Problem List   Diagnosis Date Noted  . Acute respiratory failure with hypoxia (HCC) 02/22/2018  . COPD with acute exacerbation (HCC) 02/22/2018  . Diabetes mellitus type 2 in obese (HCC) 02/22/2018  . Acute respiratory failure with hypoxia and hypercapnia (HCC) 02/22/2018  . HOT FLASHES 05/02/2010  . DIABETES MELLITUS, TYPE II 01/24/2010  . SKIN TAG 10/29/2009  . KNEE PAIN, BILATERAL 10/04/2009  . CHRONIC RHINITIS 08/01/2009  . VOCAL CORD DISORDER 08/01/2009  . GERD 08/01/2009  . SKIN RASH 06/28/2009  . HYPOXEMIA 05/21/2009  . Nonspecific (abnormal) findings on radiological and other examination of body structure 05/16/2009  . ABNORMAL CHEST XRAY 05/16/2009  . OBESITY, MORBID 04/30/2009  . TOBACCO USER 04/30/2009  . TINEA CORPORIS 04/19/2009  . HEMOCCULT POSITIVE STOOL 02/07/2009  . LEG CRAMPS 12/24/2008  . DERMATITIS, ALLERGIC 06/28/2008  . BACK PAIN, LUMBAR 05/04/2008  . OBSTRUCTIVE SLEEP APNEA 10/17/2007  . Essential hypertension 04/20/2007  . HYPERLIPIDEMIA 03/21/2007  . DEPRESSION 03/21/2007  . CEREBRAL ANEURYSM 03/21/2007  . COPD 03/21/2007  . ARTHRITIS 03/21/2007  . DVT, HX OF 03/21/2007    Orientation RESPIRATION BLADDER Height & Weight     Self, Time, Situation, Place  O2(3L  [place]) Incontinent, External catheter Weight: 272 lb 11.3 oz (123.7 kg) Height:  5\' 7"  (170.2 cm)  BEHAVIORAL SYMPTOMS/MOOD NEUROLOGICAL BOWEL NUTRITION STATUS      Continent Diet(cardiac; carb modified)  AMBULATORY STATUS COMMUNICATION OF NEEDS Skin   Extensive Assist Verbally Normal                       Personal Care Assistance Level of Assistance  Bathing, Dressing Bathing Assistance: Maximum assistance   Dressing Assistance: Maximum assistance     Functional Limitations Info             SPECIAL CARE FACTORS FREQUENCY  PT (By licensed PT), OT (By licensed OT)     PT Frequency: 5/wk OT Frequency: 5/wk            Contractures      Additional Factors Info  Code Status, Allergies Code Status Info: FULL Allergies Info: Amoxicillin-pot Clavulanate, Neosporin Neomycin-bacitracin Zn-polymyx           Current Medications (02/25/2018):  This is the current hospital active medication list Current Facility-Administered Medications  Medication Dose Route Frequency Provider Last Rate Last Dose  . acetaminophen (TYLENOL) tablet 650 mg  650 mg Oral Q6H PRN Eduard Clos, MD   650 mg at 02/24/18 2103   Or  . acetaminophen (TYLENOL) suppository 650 mg  650 mg Rectal Q6H PRN Eduard Clos, MD      . albuterol (PROVENTIL) (2.5 MG/3ML) 0.083% nebulizer solution 2.5 mg  2.5 mg Nebulization Q2H PRN Eduard Clos, MD      .  amLODipine (NORVASC) tablet 10 mg  10 mg Oral Daily Marguerita MerlesSheikh, Omair AmblerLatif, DO   10 mg at 02/25/18 1138  . atorvastatin (LIPITOR) tablet 20 mg  20 mg Oral Daily Eduard ClosKakrakandy, Arshad N, MD   20 mg at 02/25/18 1138  . azithromycin (ZITHROMAX) tablet 500 mg  500 mg Oral Daily Armandina StammerBatchelder, Nathan J, RPH   500 mg at 02/24/18 1422  . budesonide (PULMICORT) nebulizer solution 0.25 mg  0.25 mg Nebulization BID Eduard ClosKakrakandy, Arshad N, MD   0.25 mg at 02/25/18 0703  . cefTRIAXone (ROCEPHIN) 1 g in sodium chloride 0.9 % 100 mL IVPB  1 g Intravenous Q24H  Marguerita MerlesSheikh, Omair KeystoneLatif, DO   Stopped at 02/24/18 1451  . clopidogrel (PLAVIX) tablet 75 mg  75 mg Oral Daily Eduard ClosKakrakandy, Arshad N, MD   75 mg at 02/25/18 1138  . enoxaparin (LOVENOX) injection 60 mg  60 mg Subcutaneous Q24H Eduard ClosKakrakandy, Arshad N, MD   60 mg at 02/25/18 1151  . fluticasone (FLONASE) 50 MCG/ACT nasal spray 2 spray  2 spray Each Nare Daily PRN Sheikh, Kateri McOmair Latif, DO      . furosemide (LASIX) injection 80 mg  80 mg Intravenous [address]Q12H Sheikh, Omair RigbyLatif, DO   80 mg at 02/25/18 0529  . guaiFENesin (MUCINEX) 12 hr tablet 1,200 mg  1,200 mg Oral BID Marguerita MerlesSheikh, Omair Latif, DO   1,200 mg at 02/25/18 1140  . hydrALAZINE (APRESOLINE) injection 10 mg  10 mg Intravenous Q4H PRN Eduard ClosKakrakandy, Arshad N, MD   10 mg at 02/23/18 1413  . insulin aspart (novoLOG) injection 0-9 Units  0-9 Units Subcutaneous TID WC Eduard ClosKakrakandy, Arshad N, MD   2 Units at 02/25/18 1312  . ipratropium-albuterol (DUONEB) 0.5-2.5 (3) MG/3ML nebulizer solution 3 mL  3 mL Nebulization Q4H Eduard ClosKakrakandy, Arshad N, MD   3 mL at 02/25/18 1127  . lisinopril (PRINIVIL,ZESTRIL) tablet 2.5 mg  2.5 mg Oral Daily Eduard ClosKakrakandy, Arshad N, MD   2.5 mg at 02/25/18 1140  . methylPREDNISolone sodium succinate (SOLU-MEDROL) 125 mg/2 mL injection 60 mg  60 mg Intravenous Q12H Sheikh, Omair Latif, DO      . nicotine (NICODERM CQ - dosed in mg/24 hours) patch 14 mg  14 mg Transdermal Daily Marguerita MerlesSheikh, Omair WannLatif, DO   14 mg at 02/25/18 0933  . ondansetron (ZOFRAN) tablet 4 mg  4 mg Oral Q6H PRN Eduard ClosKakrakandy, Arshad N, MD       Or  . ondansetron [facility](ZOFRAN) injection 4 mg  4 mg Intravenous Q6H PRN Eduard ClosKakrakandy, Arshad N, MD      . pantoprazole (PROTONIX) EC tablet 40 mg  40 mg Oral Daily Marguerita MerlesSheikh, Omair [place]oud[place] Valley EstatesLatif, DO   40 mg at 02/25/18 1139     Discharge Medications: Please see discharge summary for a list of discharge medications.  Relevant Imaging Results:  Relevant Lab Results:   Additional Information SS#: [id number]  Burna SisUris, Armani Gawlik H, LCSW

## 2018-02-25 NOTE — Evaluation (Signed)
Physical Therapy Evaluation Patient Details Name: Karen PolingLinda J Perez MRN: [id number] DOB: 05/14/1951 Today's Date: 02/25/2018   History of Present Illness  Pt is a 67 y/o female admitted secondary to hypoxia from CAP/COPD exacerbation/CHF. PMH including but not limited to COPD, HTN, DM and hx of CVA.    Clinical Impression  Pt presented supine in bed with HOB elevated, awake and willing to participate in therapy session. Prior to admission, pt reported that she ambulated with use of rollator and required assistance with ADLs. Pt currently limited secondary to fatigue. Pt on RA throughout with SPO2 maintaining >93%. She currently requires min A for bed mobility, min A for sit<>stand transfers with RW and was able to take lateral steps at EOB with RW and min guard. Pt would continue to benefit from skilled physical therapy services at this time while admitted and after d/c to address the below listed limitations in order to improve overall safety and independence with functional mobility.     Follow Up Recommendations Home health PT;Supervision/Assistance - 24 hour    Equipment Recommendations  None recommended by PT    Recommendations for Other Services       Precautions / Restrictions Precautions Precautions: Fall Restrictions Weight Bearing Restrictions: No      Mobility  Bed Mobility Overal bed mobility: Needs Assistance Bed Mobility: Supine to Sit;Sit to Supine     Supine to sit: Min guard Sit to supine: Min assist   General bed mobility comments: increased time and effort, HOB elevated, assist to return LEs onto bed  Transfers Overall transfer level: Needs assistance Equipment used: Rolling walker (2 wheeled) Transfers: Sit to/from Stand Sit to Stand: Min assist         General transfer comment: increased time and effort, therapist stabilizing RW as pt preferring to pull (as she would at home with her rollator)  Ambulation/Gait             General Gait  Details: pt able to take 5-6 side steps at EOB bilaterally with RW and min guard for safety  Stairs            Wheelchair Mobility    Modified Rankin (Stroke Patients Only)       Balance Overall balance assessment: Needs assistance Sitting-balance support: Feet supported Sitting balance-Leahy Scale: Fair     Standing balance support: During functional activity;Bilateral upper extremity supported Standing balance-Leahy Scale: Poor                               Pertinent Vitals/Pain Pain Assessment: 0-10 Pain Score: 7  Pain Location: bilateral knees and back Pain Descriptors / Indicators: Sore Pain Intervention(s): Monitored during session;Repositioned    Home Living Family/patient expects to be discharged to:: Private residence Living Arrangements: Children Available Help at Discharge: Family;Available 24 hours/day Type of Home: House Home Access: Level entry     Home Layout: One level Home Equipment: Walker - 4 wheels;Bedside commode;Shower seat      Prior Function Level of Independence: Needs assistance   Gait / Transfers Assistance Needed: ambulates with rollator  ADL's / Homemaking Assistance Needed: requires assistance from daughter with dressing and bathing; can feed herself  Comments: still drives     Hand Dominance        Extremity/Trunk Assessment   Upper Extremity Assessment Upper Extremity Assessment: Generalized weakness    Lower Extremity Assessment Lower Extremity Assessment: Generalized weakness  Communication   Communication: No difficulties  Cognition Arousal/Alertness: Awake/alert Behavior During Therapy: WFL for tasks assessed/performed Overall Cognitive Status: Within Functional Limits for tasks assessed                                        General Comments      Exercises     Assessment/Plan    PT Assessment Patient needs continued PT services  PT Problem List Decreased  strength;Decreased activity tolerance;Decreased balance;Decreased mobility;Decreased coordination;Decreased knowledge of use of DME;Decreased safety awareness;Cardiopulmonary status limiting activity;Decreased knowledge of precautions       PT Treatment Interventions DME instruction;Gait training;Stair training;Functional mobility training;Therapeutic exercise;Therapeutic activities;Balance training;Neuromuscular re-education;Patient/family education    PT Goals (Current goals can be found in the Care Plan section)  Acute Rehab PT Goals Patient Stated Goal: return home PT Goal Formulation: With patient Time For Goal Achievement: 03/11/18 Potential to Achieve Goals: Good    Frequency Min 3X/week   Barriers to discharge        Co-evaluation               AM-PAC PT "6 Clicks" Daily Activity  Outcome Measure Difficulty turning over in bed (including adjusting bedclothes, sheets and blankets)?: Unable Difficulty moving from lying on back to sitting on the side of the bed? : Unable Difficulty sitting down on and standing up from a chair with arms (e.g., wheelchair, bedside commode, etc,.)?: Unable Help needed moving to and from a bed to chair (including a wheelchair)?: A Little Help needed walking in hospital room?: A Little Help needed climbing 3-5 steps with a railing? : Total 6 Click Score: 10    End of Session Equipment Utilized During Treatment: Gait belt Activity Tolerance: Patient limited by fatigue Patient left: in bed;with call bell/phone within reach;with bed alarm set Nurse Communication: Mobility status PT Visit Diagnosis: Other abnormalities of gait and mobility (R26.89)    Time: 1610-9604 PT Time Calculation (min) (ACUTE ONLY): 26 min   Charges:   PT Evaluation $PT Eval Moderate Complexity: 1 Mod PT Treatments $Therapeutic Activity: 8-22 mins   PT G Codes:        [place], PT, DPT [phone]   Karen Perez Karen Perez 02/25/2018, 5:33 PM

## 2018-02-25 NOTE — Clinical Social Work Note (Signed)
Clinical Social Work Assessment  Patient Details  Name: Karen Perez MRN: [id number] Date of Birth: 04/24/1951  Date of referral:  02/25/18               Reason for consult:  Facility Placement                Permission sought to share information with:  Facility Medical sales representativeContact Representative, Family Supports Permission granted to share information::  Yes, Verbal Permission Granted  Name::     Copywriter, advertisingTina  Agency::  [facility]  Relationship::  dtr  Contact Information:     Housing/Transportation Living arrangements for the past 2 months:  Single Family Home Source of Information:  Adult Children Patient Interpreter Needed:  None Criminal Activity/Legal Involvement Pertinent to Current Situation/Hospitalization:  No - Comment as needed Significant Relationships:  Adult Children Lives with:  Adult Children Do you feel safe going back to the place where you live?  No Need for family participation in patient care:  Yes (Comment)  Care giving concerns:  Pt lives at home with dtr and son-in-law.  Dtr and son-in-law work during the day so patient is alone for most of the time- usually able to be independent with mobility but has been getting weaker- does not think she can return home safely at this time.   Social Worker assessment / plan:  CSW received call from pt dtr with concerns about pt DC plan.  Per dtr patient recently moved in with her and they have not been able to get all of her medical needs settled at this time.  States that pt has a lot of medications and equipment and she isn't sure what patient actually needs to be on.  Patient dtr interested in SNF to help pt with medication management and physical therapy.  Employment status:  Retired Database administratornsurance information:  Managed Medicare PT Recommendations:  Not assessed at this time Information / Referral to community resources:  Skilled Nursing Facility  Patient/Family's Response to care:  Patient dtr hopeful for SNF placement to help pt get back  on her feet.  Patient/Family's Understanding of and Emotional Response to Diagnosis, Current Treatment, and Prognosis:  Pt dtr seems very knowledgeable about pt care and agreeable to whatever is recommended but seems overwhelmed by now being pt caregiver and trying to coordinate her needs.  Emotional Assessment Appearance:  Appears stated age Attitude/Demeanor/Rapport:  Unable to Assess Affect (typically observed):  Unable to Assess Orientation:  Oriented to Self, Oriented to Place, Oriented to  Time, Oriented to Situation Alcohol / Substance use:  Not Applicable Psych involvement (Current and /or in the community):  No (Comment)  Discharge Needs  Concerns to be addressed:  Care Coordination Readmission within the last 30 days:  No Current discharge risk:  Physical Impairment Barriers to Discharge:  Continued Medical Work up   Burna SisUris, Everett Ricciardelli H, LCSW 02/25/2018, 2:48 PM

## 2018-02-25 NOTE — Progress Notes (Signed)
Pt has declined the BIPAP for tonight.

## 2018-02-25 NOTE — Progress Notes (Addendum)
PROGRESS NOTE    Karen Perez  WGN:[id number] DOB: 03-01-1951 DOA: 02/22/2018 PCP: Lyndon Code, MD   Brief Narrative:   HPI: Karen Perez is a 67 y.o. female with history of COPD with ongoing tobacco abuse, cerebral aneurysm status post clipping and coiling, hypertension, diabetes mellitus type 2 was referred to the ER after patient was found to be hypoxic at primary care's office.  Patient had gone for a routine visit when patient was found to be hypoxic.  As per the patient's daughter patient has been increasingly short of breath last few months and gets short of breath with minimal exertion.  Has not had any chest pain fever chills.  Has been having some cough off and on.  In the ER patient is found to be hypoxic and was mildly confused. Chest x-ray shows congestion and cardiomegaly.  EKG shows normal sinus rhythm.  On exam patient has bilateral expiratory wheeze.  Patient was given Solu-Medrol nebulizer treatment for COPD and Lasix for possible CHF. A CTA was done to r/o PE and showed no PE but did show bilateral PNA. She was started on CAP coverage. Continued to wear BiPAP as she could not be weaned to [facility] yesterday AM but is on 5 Liters of O2 via  today. She is continued to be diuresed and treated for PNA, COPD, OHS/OSA and CHF and is improving   Assessment & Plan:   Principal Problem:   Acute respiratory failure with hypoxia (HCC) Active Problems:   OBESITY, MORBID   TOBACCO USER   OBSTRUCTIVE SLEEP APNEA   Essential hypertension   CEREBRAL ANEURYSM   COPD with acute exacerbation (HCC)   Diabetes mellitus type 2 in obese (HCC)   Acute respiratory failure with hypoxia and hypercapnia (HCC)  Acute Respiratory Failure with Hypoxia and Hypercarbia requiring NIPPV with BiPAP likely from multifactorial causes (CAP, Acute Exacerbation of of COPD, Acute Decompensation of Diastolic CHF, OSH/OSA), improving slowly -Placed in SDU  -Repeat ABG done yesterday showed  7.392/71.1/47.4/42.5/81.0% -Patient symptoms likely from a combination of CAP, COPD/Sleep apnea and CHF; PE ruled out.   -C/w BiPAP as tolerated and wean to Supplemental O2 as tolerlared. *Was placed on CPAP mode on the V60 BIPAP last night  -D Dimer slightly high at 0.68 ordered CTA of Chest to r/o PE given Hypoxia and Increased WOB; CTA showed no PE but did show Left > Right LL Consolidations, Mild-to-moderate Cardiomegaly, Aortic Atherosclerosis, Mild Upper Lobe Emphysema. And an 18 mm Right Lobe Thyroid Nodule -Continue with nebulizer treatments with DuoNeb 3 mL q4h and q2hprn wheezing along with Budesonide 0.25 mg Neb BID; Held Home Spiriva and Advair (given that she is on Budesonide)  -Added Abx with po Doxycycline for COPD 100 mg po BID x 5 days but changed to IV Ceftriaxone and IV Azithromycin for CAP coverage given CAP -IV Solumedrol increased from 40 mg q8h to 60 mg q8h yesterday but will wean to 60 mg q12h today ; Given 125 mg Solumedrol in ED - Will also add Protonix 40 mg for GI Prophylaxis given that patient is on IV Steroids -C/w IV Furosemide 80 mg BID; -BNP was 45.3 but maybe artificially low in obesity -Checked Transthoracic Echocardiogram and showed EF of 65-70% and G1DD -Strict I's/O's, Daily Weights, SLIV; Patient is -4,780 mL so far and Weight is down 7 lbs -Cycled Cardiac Troponins and Negative at <0.03 x3 -Continue to Monitor Respiratory Status Closely and continue BiPAP/CPAP qHS and PRN -Checked Respiratory Virus Panel and  Influenza A/B PCR and was Negative; Droplet Precautions Discontinued -Maintain O2 Saturations >92% and Continue to Wean O2 requirements  -Add Flutter Valve and Incentive Spirometry  -CXR this AM showed Cardiac shadow is prominent but stable. The previously seen vascular congestion has nearly completely resolved. Improved aeration is noted in the left lung base. No bony abnormality is seen. -If not improving in the next few days will discuss with Pulmonary  for further evaluation and Reccomendations -PT/OT Evaluate and Treat -Ambulatory Pulse Oximetry Screening -Ambulate in the Halls  Essential Hypertension  -BP this AM was 152/79 -C/w Home Lisinopril 2.5 mg po Daily -C/w Home Amlodipine 10 mg po Daily (per report patient not taking) -C/w IV Hydralazine 10 mg IV q4hprn for SBP >140   Diabetes Mellitus Type 2 -Hold home Kombiglyze XR 5-500 mg po Daily  -Patient has been placed on Sensitive Novolog SSI AC -Checked HbA1c and was 5.6 -Closely follow CBGs since patient is on steroids; CBG's ranging from 116-122  History of Sleep Apnea -See Above -Placed on CPAP Mode on V60 BiPAP last night -Patient is noncompliant per Daughters report to admitting physician   Tobacco Abuse -Tobacco cessation counseling requested. -Discussed with patient about Nicotine Patch and will start 14 mg TD  History of Cerebral Aneurysm status post clipping and coiling with history of stroke  -C/w Atorvastatin 20 mg po Daily and Clopidogrel 75 mg po Daily  Morbid Obesity. -Patient's BMI was 43.89 kg/m2 on admission and now 42.71 from Diuresis  -Weight Loss Counseling Given   Thyroid Nodule -Noted on CTA of Chest -18 mm on Right Side -Checked TSH and was 0.415 and FT4 was 0.88 -Will need full Outpatient Workup   DVT prophylaxis: Enoxaparin 60 mg sq q24h Code Status: FULL CODE Family Communication: No family present at bedside Disposition Plan: Remain Inpatient for further workup and  Consultants:   None   Procedures:  ECHOCARDIOGRAM ------------------------------------------------------------------- Study Conclusions  - Left ventricle: The cavity size was normal. There was severe   concentric hypertrophy. Systolic function was vigorous. The   estimated ejection fraction was in the range of 65% to 70%. Wall   motion was normal; there were no regional wall motion   abnormalities. Doppler parameters are consistent with abnormal   left  ventricular relaxation (grade 1 diastolic dysfunction).   There was no evidence of elevated ventricular filling pressure by   Doppler parameters. - Aortic valve: There was no regurgitation. - Aortic root: The aortic root was normal in size. - Mitral valve: There was no regurgitation. - Left atrium: The atrium was mildly dilated. - Right ventricle: Systolic function was normal. - Right atrium: The atrium was normal in size. - Tricuspid valve: There was mild regurgitation. - Pulmonic valve: There was no regurgitation. - Pulmonary arteries: Systolic pressure was within the normal   range. - Inferior vena cava: The vessel was normal in size. - Pericardium, extracardiac: There was no pericardial effusion.   Antimicrobials:  Anti-infectives (From admission, onward)   Start     Dose/Rate Route Frequency Ordered Stop   02/24/18 0913  azithromycin (ZITHROMAX) tablet 500 mg     500 mg Oral Daily 02/24/18 0913 02/28/18 1459   02/23/18 1500  cefTRIAXone (ROCEPHIN) 1 g in sodium chloride 0.9 % 100 mL IVPB     1 g 200 mL/hr over 30 Minutes Intravenous Every 24 hours 02/23/18 1420     02/23/18 1500  azithromycin (ZITHROMAX) 500 mg in sodium chloride 0.9 % 250 mL IVPB  Status:  Discontinued     500 mg 250 mL/hr over 60 Minutes Intravenous Every 24 hours 02/23/18 1420 02/24/18 0913   02/23/18 1000  doxycycline (VIBRA-TABS) tablet 100 mg  Status:  Discontinued     100 mg Oral Every 12 hours 02/23/18 0959 02/23/18 1420     Subjective: Seen and examined this AM and stated she was feeling much better. She was wearing CPAP when examined and stated she has been urinating a lot and Breathing is much improved. No CP. No lightheadedness or dizziness. No other complaints or concerns at this time.  Objective: Vitals:   02/25/18 0500 02/25/18 0600 02/25/18 0704 02/25/18 0745  BP:    (!) 152/79  Pulse: 63 67 65 68  Resp: (!) 23 (!) 23 (!) 24 (!) 21  Temp:    (!) 97.4 F (36.3 C)  TempSrc:    Oral    SpO2: 94% 96% 95% 94%  Weight:      Height:        Intake/Output Summary (Last 24 hours) at 02/25/2018 0749 Last data filed at 02/25/2018 0555 Gross per 24 hour  Intake 1420 ml  Output 4350 ml  Net -2930 ml   Filed Weights   02/23/18 1524 02/24/18 0500 02/25/18 0410  Weight: 126.8 kg (279 lb 8.7 oz) 124.8 kg (275 lb 2.1 oz) 123.7 kg (272 lb 11.3 oz)   Examination: Physical Exam:  Constitutional: WN/WD morbidly obese AAF in NAD wearing BiPAP Eyes: Sclerae anicteric; Lids Normal ENMT: External Ears and nose appear normal. Grossly normal hearing  Neck: Supple with no appreciable JVD but difficult to assess given CPAP Respiratory: Diminished with mild expiratory wheezing. Crackles are improving. Wearing CPAP and has unlabored breathing Cardiovascular: RRR; No appreciable m/r/g. 1+ LE edema Abdomen: Soft, NT, Distended due to body habitus Bowel sounds present GU: Deferred. Has Purewick in place Musculoskeletal: No contractures; No cyanosis Skin: Warm and Dry; No appreciable rashes or lesions on a limited skin eval Neurologic: CN 2-12 grossly intact. No appreciable focal deficits Psychiatric: Normal and pleasant mood and affect. Intact judgement and insight  Data Reviewed: I have personally reviewed following labs and imaging studies  CBC: Recent Labs  Lab 02/22/18 1310 02/23/18 0125 02/23/18 0545 02/24/18 0350 02/25/18 0327  WBC 6.0 7.6 6.0 6.7 8.0  NEUTROABS  --   --   --  5.8 6.7  HGB 14.3 14.6 14.5 14.0 14.6  HCT 46.2* 48.0* 48.1* 46.3* 47.9*  MCV 98.5 98.4 99.0 99.1 97.8  PLT 205 209 210 223 210   Basic Metabolic Panel: Recent Labs  Lab 02/22/18 1310 02/23/18 0125 02/23/18 0545 02/24/18 0350 02/25/18 0327  NA 139  --  141 141 139  K 4.0  --  4.3 4.0 3.5  CL 101  --  98* 94* 91*  CO2 31  --  30 37* 37*  GLUCOSE 107*  --  167* 137* 149*  BUN 6  --  6 12 17   CREATININE 0.67 0.70 0.76 0.70 0.74  CALCIUM 8.7*  --  8.6* 8.6* 8.7*  MG  --   --   --  2.4 2.4   PHOS  --   --   --  4.1 3.9   GFR: Estimated Creatinine Clearance: 91.8 mL/min (by C-G formula based on SCr of 0.74 mg/dL). Liver Function Tests: Recent Labs  Lab 02/24/18 0350 02/25/18 0327  AST 13* 14*  ALT 12* 13*  ALKPHOS 59 53  BILITOT 0.6 0.5  PROT 7.1 7.2  ALBUMIN 3.8  3.6   No results for input(s): LIPASE, AMYLASE in the last 168 hours. No results for input(s): AMMONIA in the last 168 hours. Coagulation Profile: No results for input(s): INR, PROTIME in the last 168 hours. Cardiac Enzymes: Recent Labs  Lab 02/23/18 0125 02/23/18 0545 02/23/18 1137  TROPONINI <0.03 <0.03 <0.03   BNP (last 3 results) No results for input(s): PROBNP in the last 8760 hours. HbA1C: Recent Labs    02/23/18 1450  HGBA1C 5.6   CBG: Recent Labs  Lab 02/24/18 0747 02/24/18 1157 02/24/18 1644 02/24/18 2108 02/25/18 0744  GLUCAP 130* 116* 122* 166* 121*   Lipid Profile: No results for input(s): CHOL, HDL, LDLCALC, TRIG, CHOLHDL, LDLDIRECT in the last 72 hours. Thyroid Function Tests: Recent Labs    02/23/18 0125 02/24/18 0753  TSH 0.415  --   FREET4  --  0.88   Anemia Panel: No results for input(s): VITAMINB12, FOLATE, FERRITIN, TIBC, IRON, RETICCTPCT in the last 72 hours. Sepsis Labs: No results for input(s): PROCALCITON, LATICACIDVEN in the last 168 hours.  Recent Results (from the past 240 hour(s))  MRSA PCR Screening     Status: None   Collection Time: 02/23/18  3:16 PM  Result Value Ref Range Status   MRSA by PCR NEGATIVE NEGATIVE Final    Comment:        The GeneXpert MRSA Assay (FDA approved for NASAL specimens only), is one component of a comprehensive MRSA colonization surveillance program. It is not intended to diagnose MRSA infection nor to guide or monitor treatment for MRSA infections. Performed at [facility], [address] [place], [place] [address]   Respiratory Panel by PCR     Status: None   Collection Time: 02/24/18 12:09 PM   Result Value Ref Range Status   Adenovirus NOT DETECTED NOT DETECTED Final   Coronavirus 229E NOT DETECTED NOT DETECTED Final   Coronavirus HKU1 NOT DETECTED NOT DETECTED Final   Coronavirus NL63 NOT DETECTED NOT DETECTED Final   Coronavirus OC43 NOT DETECTED NOT DETECTED Final   Metapneumovirus NOT DETECTED NOT DETECTED Final   Rhinovirus / Enterovirus NOT DETECTED NOT DETECTED Final   Influenza A NOT DETECTED NOT DETECTED Final   Influenza B NOT DETECTED NOT DETECTED Final   Parainfluenza Virus 1 NOT DETECTED NOT DETECTED Final   Parainfluenza Virus 2 NOT DETECTED NOT DETECTED Final   Parainfluenza Virus 3 NOT DETECTED NOT DETECTED Final   Parainfluenza Virus 4 NOT DETECTED NOT DETECTED Final   Respiratory Syncytial Virus NOT DETECTED NOT DETECTED Final   Bordetella pertussis NOT DETECTED NOT DETECTED Final   Chlamydophila pneumoniae NOT DETECTED NOT DETECTED Final   Mycoplasma pneumoniae NOT DETECTED NOT DETECTED Final    Radiology Studies: Ct Angio Chest Pe W Or Wo Contrast  Result Date: 02/23/2018 CLINICAL DATA:  67 y/o F; shortness of breath and back pain with breathing. EXAM: CT ANGIOGRAPHY CHEST WITH CONTRAST TECHNIQUE: Multidetector CT imaging of the chest was performed using the standard protocol during bolus administration of intravenous contrast. Multiplanar CT image reconstructions and MIPs were obtained to evaluate the vascular anatomy. CONTRAST:  100mL ISOVUE-370 IOPAMIDOL (ISOVUE-370) INJECTION 76% COMPARISON:  02/22/2018 chest radiograph FINDINGS: Cardiovascular: Mild-to-moderate cardiomegaly. No pericardial effusion. Normal caliber thoracic aorta with calcific atherosclerosis. Satisfactory opacification of the pulmonary arteries. Mediastinum/Nodes: 18 mm nodule within the right lobe of the thyroid. Normal esophagus. No mediastinal adenopathy. Lungs/Pleura: Mild centrilobular emphysema in the upper lobes. Left-greater-than-right lower lobe consolidations with peribronchial  thickening and mucous  plugging. No pleural effusion. Upper Abdomen: No acute abnormality. Musculoskeletal: No chest wall abnormality. No acute or significant osseous findings. Review of the MIP images confirms the above findings. IMPRESSION: 1. No pulmonary embolus identified. 2. Left-greater-than-right lower lobe consolidations, likely pneumonia. 3. Mild-to-moderate cardiomegaly and aortic atherosclerosis. 4. Mild upper lobe emphysema. 5. 18 mm nodule in right lobe of thyroid. Thyroid ultrasound is recommended on a nonemergent basis. Electronically Signed   By: Mitzi Hansen M.D.   On: 02/23/2018 13:43   Dg Chest Port 1 View  Result Date: 02/24/2018 CLINICAL DATA:  Shortness of breath EXAM: PORTABLE CHEST 1 VIEW COMPARISON:  Portable exam 0532 hours compared to 02/22/2018 chest radiograph and CT chest 02/23/2018 FINDINGS: Enlargement of cardiac silhouette with pulmonary vascular congestion. Mediastinal contours normal. Mild RIGHT atelectasis versus infiltrate. Increased consolidation of LEFT lower lobe since prior exams. No gross pleural effusion or pneumothorax. IMPRESSION: Enlargement of cardiac silhouette with pulmonary vascular congestion. Increased LEFT lower lobe consolidation with persistent mild atelectasis versus consolidation at RIGHT base. Electronically Signed   By: Ulyses Southward M.D.   On: 02/24/2018 09:01   Scheduled Meds: . amLODipine  10 mg Oral Daily  . atorvastatin  20 mg Oral Daily  . azithromycin  500 mg Oral Daily  . budesonide (PULMICORT) nebulizer solution  0.25 mg Nebulization BID  . clopidogrel  75 mg Oral Daily  . enoxaparin (LOVENOX) injection  60 mg Subcutaneous Q24H  . furosemide  80 mg Intravenous Q12H  . guaiFENesin  1,200 mg Oral BID  . insulin aspart  0-9 Units Subcutaneous TID WC  . ipratropium-albuterol  3 mL Nebulization Q4H  . lisinopril  2.5 mg Oral Daily  . methylPREDNISolone (SOLU-MEDROL) injection  60 mg Intravenous Q8H   Continuous Infusions: .  cefTRIAXone (ROCEPHIN)  IV Stopped (02/24/18 1451)    LOS: 3 days   Merlene Laughter, DO [facility] Pager [phone]  If 7PM-7AM, please contact night-coverage [email or web address] Password [facility] 02/25/2018, 7:49 AM

## 2018-02-26 ENCOUNTER — Inpatient Hospital Stay (HOSPITAL_COMMUNITY): Payer: Medicare HMO

## 2018-02-26 LAB — COMPREHENSIVE METABOLIC PANEL
ALT: 11 U/L — ABNORMAL LOW (ref 14–54)
ANION GAP: 12 (ref 5–15)
AST: 14 U/L — ABNORMAL LOW (ref 15–41)
Albumin: 3.6 g/dL (ref 3.5–5.0)
Alkaline Phosphatase: 58 U/L (ref 38–126)
BUN: 23 mg/dL — ABNORMAL HIGH (ref 6–20)
CHLORIDE: 89 mmol/L — AB (ref 101–111)
CO2: 32 mmol/L (ref 22–32)
CREATININE: 0.82 mg/dL (ref 0.44–1.00)
Calcium: 8.5 mg/dL — ABNORMAL LOW (ref 8.9–10.3)
Glucose, Bld: 139 mg/dL — ABNORMAL HIGH (ref 65–99)
POTASSIUM: 3.9 mmol/L (ref 3.5–5.1)
SODIUM: 133 mmol/L — AB (ref 135–145)
Total Bilirubin: 0.6 mg/dL (ref 0.3–1.2)
Total Protein: 6.9 g/dL (ref 6.5–8.1)

## 2018-02-26 LAB — CBC WITH DIFFERENTIAL/PLATELET
Basophils Absolute: 0 10*3/uL (ref 0.0–0.1)
Basophils Relative: 0 %
EOS ABS: 0 10*3/uL (ref 0.0–0.7)
EOS PCT: 0 %
HCT: 47.2 % — ABNORMAL HIGH (ref 36.0–46.0)
Hemoglobin: 14.9 g/dL (ref 12.0–15.0)
LYMPHS ABS: 0.6 10*3/uL — AB (ref 0.7–4.0)
LYMPHS PCT: 7 %
MCH: 30 pg (ref 26.0–34.0)
MCHC: 31.6 g/dL (ref 30.0–36.0)
MCV: 95.2 fL (ref 78.0–100.0)
Monocytes Absolute: 0.7 10*3/uL (ref 0.1–1.0)
Monocytes Relative: 8 %
Neutro Abs: 7.8 10*3/uL — ABNORMAL HIGH (ref 1.7–7.7)
Neutrophils Relative %: 85 %
PLATELETS: 215 10*3/uL (ref 150–400)
RBC: 4.96 MIL/uL (ref 3.87–5.11)
RDW: 14.2 % (ref 11.5–15.5)
WBC: 9.1 10*3/uL (ref 4.0–10.5)

## 2018-02-26 LAB — GLUCOSE, CAPILLARY
GLUCOSE-CAPILLARY: 112 mg/dL — AB (ref 65–99)
Glucose-Capillary: 138 mg/dL — ABNORMAL HIGH (ref 65–99)
Glucose-Capillary: 182 mg/dL — ABNORMAL HIGH (ref 65–99)
Glucose-Capillary: 168 mg/dL — ABNORMAL HIGH (ref 65–99)
Glucose-Capillary: 216 mg/dL — ABNORMAL HIGH (ref 65–99)

## 2018-02-26 LAB — MAGNESIUM: MAGNESIUM: 2.3 mg/dL (ref 1.7–2.4)

## 2018-02-26 LAB — PHOSPHORUS: PHOSPHORUS: 3.5 mg/dL (ref 2.5–4.6)

## 2018-02-26 MED ORDER — ORAL CARE MOUTH RINSE
15.0000 mL | Freq: Two times a day (BID) | OROMUCOSAL | Status: DC
  Administered 2018-02-26 – 2018-03-02 (×6): 15 mL via OROMUCOSAL

## 2018-02-26 MED ORDER — METHYLPREDNISOLONE SODIUM SUCC 40 MG IJ SOLR
40.0000 mg | Freq: Two times a day (BID) | INTRAMUSCULAR | Status: DC
  Administered 2018-02-26 – 2018-02-28 (×4): 40 mg via INTRAVENOUS
  Filled 2018-02-26 (×4): qty 1

## 2018-02-26 NOTE — Progress Notes (Signed)
SATURATION QUALIFICATIONS: (This note is used to comply with regulatory documentation for home oxygen)  Patient Saturations on Room Air at Rest = 90%  Patient Saturations on Room Air while Ambulating = 80%  Patient Saturations on 2 Liters of oxygen while Ambulating = 92%  Please briefly explain why patient needs home oxygen: Patient gets short of breath when ambulating and desats quickly to the 80s.

## 2018-02-26 NOTE — Progress Notes (Signed)
Pt placed on BiPAP for the night per pt request. Will continue to monitor.  Caswell Corwinana C Nuri Larmer, RN 02/26/18 10:19 PM

## 2018-02-26 NOTE — Progress Notes (Signed)
PROGRESS NOTE    [name]  ZOX:[id number] DOB: December 05, 1950 DOA: 02/22/2018 PCP: Lyndon Code, MD   Brief Narrative:  Karen Perez is a 67 y.o. female with history of COPD with ongoing tobacco abuse, cerebral aneurysm status post clipping and coiling, hypertension, diabetes mellitus type 2 was referred to the ER after patient was found to be hypoxic at primary care's office.  Patient had gone for a routine visit when patient was found to be hypoxic.  As per the patient's daughter patient has been increasingly short of breath last few months and gets short of breath with minimal exertion.  Has not had any chest pain fever chills.  Has been having some cough off and on.  In the ER patient is found to be hypoxic and was mildly confused. Chest x-ray shows congestion and cardiomegaly.  EKG shows normal sinus rhythm.  On exam patient has bilateral expiratory wheeze.  Patient was given Solu-Medrol, nebulizer treatments for COPD, and Lasix for possible CHF. A CTA was done to r/o PE and showed no PE but did show bilateral PNA. She was started on CAP coverage. Continued to wear BiPAP as she could not be weaned to [facility] yesterday AM but is on 5 Liters of O2 via [place] today. She is continued to be diuresed and treated for PNA, COPD, OHS/OSA and CHF and is improving. Ambulatory O2 Screen done and showed patient desaturated and needs Home O2. Will repeat again in AM.   Assessment & Plan:   Principal Problem:   Acute respiratory failure with hypoxia (HCC) Active Problems:   OBESITY, MORBID   TOBACCO USER   OBSTRUCTIVE SLEEP APNEA   Essential hypertension   CEREBRAL ANEURYSM   COPD with acute exacerbation (HCC)   Diabetes mellitus type 2 in obese (HCC)   Acute respiratory failure with hypoxia and hypercapnia (HCC)  Acute Respiratory Failure with Hypoxia and Hypercarbia requiring NIPPV with BiPAP likely from multifactorial causes (CAP, Acute Exacerbation of of COPD, Acute Decompensation of Diastolic CHF,  OSH/OSA), improving slowly -Placed in SDU  -Patient symptoms likely from a combination of CAP, COPD/Sleep apnea and CHF; PE ruled out.   -C/w BiPAP as tolerated and wean to Supplemental O2 as tolerlared. *Was placed on CPAP mode on the V60 BIPAP night before but refused last night.  -D Dimer slightly high at 0.68 ordered CTA of Chest to r/o PE given Hypoxia and Increased WOB; CTA showed no PE but did show Left > Right LL Consolidations, Mild-to-moderate Cardiomegaly, Aortic Atherosclerosis, Mild Upper Lobe Emphysema. And an 18 mm Right Lobe Thyroid Nodule -Continue with nebulizer treatments with DuoNeb 3 mL q4h and q2hprn wheezing along with Budesonide 0.25 mg Neb BID; Held Home Spiriva and Advair (given that she is on Budesonide)  -Added Abx with po Doxycycline for COPD 100 mg po BID x 5 days but changed to IV Ceftriaxone and IV Azithromycin for CAP coverage given CAP -IV Solumedrol  wean to 60 mg q12h to IV 40 mg q12h; Given 125 mg Solumedrol in ED - Add Protonix 40 mg for GI Prophylaxis given that patient is on IV Steroids -C/w IV Furosemide 80 mg BID and change to po In AM -BNP was 45.3 but maybe artificially low in obesity -Checked Transthoracic Echocardiogram and showed EF of 65-70% and G1DD -Strict I's/O's, Daily Weights, SLIV; Patient is -6,640 mL so far and Weight is down 8 lbs -Cycled Cardiac Troponins and Negative at <0.03 x3 -Continue to Monitor Respiratory Status Closely and continue  BiPAP/CPAP qHS and PRN -Checked Respiratory Virus Panel and Influenza A/B PCR and was Negative; Droplet Precautions Discontinued -Maintain O2 Saturations >92% and Continue to Wean O2 requirements  -Add Flutter Valve and Incentive Spirometry  -CXR this AM showed Stable cardiomediastinal silhouette. Normal pulmonary vascularity.Persistent low lung volumes with slightly improved aeration the left lung base. No focal consolidation, pleural effusion, or pneumothorax. No acute osseous abnormality. -If not  improving in the next few days will discuss with Pulmonary for further evaluation and Reccomendations -PT/OT Evaluate and Treat -Ambulatory Pulse Oximetry Screening done this AM and patient desturated to 80's on Room Air; Will need to arrange Home O2 at D/C -[facility]  Essential Hypertension  -BP this AM was 132/98 -C/w Home Lisinopril 2.5 mg po Daily -C/w Home Amlodipine 10 mg po Daily (per report patient not taking) -C/w IV Hydralazine 10 mg IV q4hprn for SBP >140   Diabetes Mellitus Type 2 -Hold home Kombiglyze XR 5-500 mg po Daily  -Patient has been placed on Sensitive Novolog SSI AC -Checked HbA1c and was 5.6 -Closely follow CBGs since patient is on steroids; CBG's ranging from 112-173  History of Sleep Apnea -See Above -Placed on CPAP Mode on V60 BiPAP the night before -Patient is noncompliant per Daughters report to admitting physician   Tobacco Abuse -Tobacco cessation counseling requested. -Discussed with patient about Nicotine Patch and started 14 mg TD  History of Cerebral Aneurysm status post clipping and coiling with history of stroke  -C/w Atorvastatin 20 mg po Daily and Clopidogrel 75 mg po Daily  Morbid Obesity. -Patient's BMI was 43.89 kg/m2 on admission and now 42.51 from Diuresis  -Weight Loss Counseling Given   Thyroid Nodule -Noted on CTA of Chest -18 mm on Right Side -Checked TSH and was 0.415 and FT4 was 0.88 -Will need full Outpatient Workup   Hyponatremia/Hypochloremia -Mild. Na+ was 133 and Chloride was 89 -Likely from Diuresis -Continue to Monitor and Repeat CMP in AM  DVT prophylaxis: Enoxaparin 60 mg sq q24h Code Status: FULL CODE Family Communication: No family present at bedside Disposition Plan: Remain Inpatient for further workup and anticipate D/C Home with Home Health in next 24-48 hours  Consultants:   None   Procedures:   ECHOCARDIOGRAM ------------------------------------------------------------------- Study Conclusions  - Left ventricle: The cavity size was normal. There was severe   concentric hypertrophy. Systolic function was vigorous. The   estimated ejection fraction was in the range of 65% to 70%. Wall   motion was normal; there were no regional wall motion   abnormalities. Doppler parameters are consistent with abnormal   left ventricular relaxation (grade 1 diastolic dysfunction).   There was no evidence of elevated ventricular filling pressure by   Doppler parameters. - Aortic valve: There was no regurgitation. - Aortic root: The aortic root was normal in size. - Mitral valve: There was no regurgitation. - Left atrium: The atrium was mildly dilated. - Right ventricle: Systolic function was normal. - Right atrium: The atrium was normal in size. - Tricuspid valve: There was mild regurgitation. - Pulmonic valve: There was no regurgitation. - Pulmonary arteries: Systolic pressure was within the normal   range. - Inferior vena cava: The vessel was normal in size. - Pericardium, extracardiac: There was no pericardial effusion.   Antimicrobials:  Anti-infectives (From admission, onward)   Start     Dose/Rate Route Frequency Ordered Stop   02/24/18 0913  azithromycin (ZITHROMAX) tablet 500 mg     500 mg Oral  Daily 02/24/18 0913 02/28/18 1459   02/23/18 1500  cefTRIAXone (ROCEPHIN) 1 g in sodium chloride 0.9 % 100 mL IVPB     1 g 200 mL/hr over 30 Minutes Intravenous Every 24 hours 02/23/18 1420     02/23/18 1500  azithromycin (ZITHROMAX) 500 mg in sodium chloride 0.9 % 250 mL IVPB  Status:  Discontinued     500 mg 250 mL/hr over 60 Minutes Intravenous Every 24 hours 02/23/18 1420 02/24/18 0913   02/23/18 1000  doxycycline (VIBRA-TABS) tablet 100 mg  Status:  Discontinued     100 mg Oral Every 12 hours 02/23/18 0959 02/23/18 1420     Subjective: Seen and examined this AM and was feeling  better and on Room Air breathing well. No CP or SOB. States she had some pain in her legs yesterday. No other complaints or concerns at this time.   Objective: Vitals:   02/26/18 0600 02/26/18 0707 02/26/18 0814 02/26/18 1111  BP:   (!) 132/98   Pulse: 68 83 86 82  Resp: 17 (!) 29 (!) 29 18  Temp:   97.8 F (36.6 C)   TempSrc:   Oral   SpO2: (!) 86% 97% 94% 96%  Weight:      Height:        Intake/Output Summary (Last 24 hours) at 02/26/2018 1311 Last data filed at 02/26/2018 0906 Gross per 24 hour  Intake 600 ml  Output 2800 ml  Net -2200 ml   Filed Weights   02/24/18 0500 02/25/18 0410 02/26/18 0303  Weight: 124.8 kg (275 lb 2.1 oz) 123.7 kg (272 lb 11.3 oz) 123.2 kg (271 lb 9.7 oz)   Examination: Physical Exam:  Constitutional: WN/WD morbidly obese AAF in NAD watching television and is calm  Eyes: Sclerae anicteric. Lids normal ENMT: External Ears and nose appear normal. Grossly normal hearing Neck: Supple with no JVD Respiratory: Diminished to auscultation and wheezing has improved. Slight crackles. Unlabored breathing but slight dyspnea when speaking  Cardiovascular: RRR; No appreciable m/r/g. Trace LE edema Abdomen: Soft, NT, Distended due to body habitus. Bowel sounds present GU: Deferred. Has a Purewick in place Musculoskeletal: No contractures; No cyanosis Skin: Warm and Dry; No appreciable rashes or lesions on a limited skin eval Neurologic: CN 2-12 grossly intact; No appreciable focal deficits Psychiatric: Normal and pleasant mood and affect. Intact judgement and insight  Data Reviewed: I have personally reviewed following labs and imaging studies  CBC: Recent Labs  Lab 02/23/18 0125 02/23/18 0545 02/24/18 0350 02/25/18 0327 02/26/18 0245  WBC 7.6 6.0 6.7 8.0 9.1  NEUTROABS  --   --  5.8 6.7 7.8*  HGB 14.6 14.5 14.0 14.6 14.9  HCT 48.0* 48.1* 46.3* 47.9* 47.2*  MCV 98.4 99.0 99.1 97.8 95.2  PLT 209 210 223 210 215   Basic Metabolic Panel: Recent  Labs  Lab 02/22/18 1310 02/23/18 0125 02/23/18 0545 02/24/18 0350 02/25/18 0327 02/26/18 0245  NA 139  --  141 141 139 133*  K 4.0  --  4.3 4.0 3.5 3.9  CL 101  --  98* 94* 91* 89*  CO2 31  --  30 37* 37* 32  GLUCOSE 107*  --  167* 137* 149* 139*  BUN 6  --  6 12 17  23*  CREATININE 0.67 0.70 0.76 0.70 0.74 0.82  CALCIUM 8.7*  --  8.6* 8.6* 8.7* 8.5*  MG  --   --   --  2.4 2.4 2.3  PHOS  --   --   --  4.1 3.9 3.5   GFR: Estimated Creatinine Clearance: 89.4 mL/min (by C-G formula based on SCr of 0.82 mg/dL). Liver Function Tests: Recent Labs  Lab 02/24/18 0350 02/25/18 0327 02/26/18 0245  AST 13* 14* 14*  ALT 12* 13* 11*  ALKPHOS 59 53 58  BILITOT 0.6 0.5 0.6  PROT 7.1 7.2 6.9  ALBUMIN 3.8 3.6 3.6   No results for input(s): LIPASE, AMYLASE in the last 168 hours. No results for input(s): AMMONIA in the last 168 hours. Coagulation Profile: No results for input(s): INR, PROTIME in the last 168 hours. Cardiac Enzymes: Recent Labs  Lab 02/23/18 0125 02/23/18 0545 02/23/18 1137  TROPONINI <0.03 <0.03 <0.03   BNP (last 3 results) No results for input(s): PROBNP in the last 8760 hours. HbA1C: Recent Labs    02/23/18 1450  HGBA1C 5.6   CBG: Recent Labs  Lab 02/25/18 1214 02/25/18 1615 02/25/18 2126 02/26/18 0749 02/26/18 1234  GLUCAP 173* 153* 169* 112* 138*   Lipid Profile: No results for input(s): CHOL, HDL, LDLCALC, TRIG, CHOLHDL, LDLDIRECT in the last 72 hours. Thyroid Function Tests: Recent Labs    02/24/18 0753  FREET4 0.88   Anemia Panel: No results for input(s): VITAMINB12, FOLATE, FERRITIN, TIBC, IRON, RETICCTPCT in the last 72 hours. Sepsis Labs: No results for input(s): PROCALCITON, LATICACIDVEN in the last 168 hours.  Recent Results (from the past 240 hour(s))  MRSA PCR Screening     Status: None   Collection Time: 02/23/18  3:16 PM  Result Value Ref Range Status   MRSA by PCR NEGATIVE NEGATIVE Final    Comment:        The GeneXpert  MRSA Assay (FDA approved for NASAL specimens only), is one component of a comprehensive MRSA colonization surveillance program. It is not intended to diagnose MRSA infection nor to guide or monitor treatment for MRSA infections. Performed at [facility], [address], [place], [place] [address]   Respiratory Panel by PCR     Status: None   Collection Time: 02/24/18 12:09 PM  Result Value Ref Range Status   Adenovirus NOT DETECTED NOT DETECTED Final   Coronavirus 229E NOT DETECTED NOT DETECTED Final   Coronavirus HKU1 NOT DETECTED NOT DETECTED Final   Coronavirus NL63 NOT DETECTED NOT DETECTED Final   Coronavirus OC43 NOT DETECTED NOT DETECTED Final   Metapneumovirus NOT DETECTED NOT DETECTED Final   Rhinovirus / Enterovirus NOT DETECTED NOT DETECTED Final   Influenza A NOT DETECTED NOT DETECTED Final   Influenza B NOT DETECTED NOT DETECTED Final   Parainfluenza Virus 1 NOT DETECTED NOT DETECTED Final   Parainfluenza Virus 2 NOT DETECTED NOT DETECTED Final   Parainfluenza Virus 3 NOT DETECTED NOT DETECTED Final   Parainfluenza Virus 4 NOT DETECTED NOT DETECTED Final   Respiratory Syncytial Virus NOT DETECTED NOT DETECTED Final   Bordetella pertussis NOT DETECTED NOT DETECTED Final   Chlamydophila pneumoniae NOT DETECTED NOT DETECTED Final   Mycoplasma pneumoniae NOT DETECTED NOT DETECTED Final    Radiology Studies: Dg Chest Port 1 View  Result Date: 02/26/2018 CLINICAL DATA:  Shortness of breath and wheezing. EXAM: PORTABLE CHEST 1 VIEW COMPARISON:  Chest x-ray from yesterday. FINDINGS: Stable cardiomediastinal silhouette. Normal pulmonary vascularity. Persistent low lung volumes with slightly improved aeration the left lung base. No focal consolidation, pleural effusion, or pneumothorax. No acute osseous abnormality. IMPRESSION: Slightly improved aeration in the left lower lobe. Electronically Signed   By: Obie Dredge M.D.   On: 02/26/2018  08:02   Dg Chest Port 1  View  Result Date: 02/25/2018 CLINICAL DATA:  Shortness of Breath EXAM: PORTABLE CHEST 1 VIEW COMPARISON:  02/24/2018 FINDINGS: Cardiac shadow is prominent but stable. The previously seen vascular congestion has nearly completely resolved. Improved aeration is noted in the left lung base. No bony abnormality is seen. IMPRESSION: Decrease in vascular congestion with improved aeration in the left base. Electronically Signed   By: Alcide Clever M.D.   On: 02/25/2018 08:57   Scheduled Meds: . amLODipine  10 mg Oral Daily  . atorvastatin  20 mg Oral Daily  . azithromycin  500 mg Oral Daily  . budesonide (PULMICORT) nebulizer solution  0.25 mg Nebulization BID  . clopidogrel  75 mg Oral Daily  . enoxaparin (LOVENOX) injection  60 mg Subcutaneous Q24H  . furosemide  80 mg Intravenous Q12H  . guaiFENesin  1,200 mg Oral BID  . insulin aspart  0-9 Units Subcutaneous TID WC  . ipratropium-albuterol  3 mL Nebulization QID  . lisinopril  2.5 mg Oral Daily  . methylPREDNISolone (SOLU-MEDROL) injection  40 mg Intravenous Q12H  . nicotine  14 mg Transdermal Daily  . pantoprazole  40 mg Oral Daily   Continuous Infusions: . cefTRIAXone (ROCEPHIN)  IV Stopped (02/25/18 1536)    LOS: 4 days   Merlene Laughter, DO [facility] Pager [phone]  If 7PM-7AM, please contact night-coverage [email or web address] Password [email or web address] 02/26/2018, 1:11 PM

## 2018-02-27 LAB — COMPREHENSIVE METABOLIC PANEL
ALBUMIN: 3.6 g/dL (ref 3.5–5.0)
ALK PHOS: 56 U/L (ref 38–126)
ALT: 15 U/L (ref 14–54)
AST: 15 U/L (ref 15–41)
Anion gap: 10 (ref 5–15)
BILIRUBIN TOTAL: 0.6 mg/dL (ref 0.3–1.2)
BUN: 26 mg/dL — ABNORMAL HIGH (ref 6–20)
CALCIUM: 8.5 mg/dL — AB (ref 8.9–10.3)
CO2: 37 mmol/L — AB (ref 22–32)
CREATININE: 0.81 mg/dL (ref 0.44–1.00)
Chloride: 88 mmol/L — ABNORMAL LOW (ref 101–111)
GFR calc Af Amer: >60 mL/min (ref >60)
GFR calc non Af Amer: >60 mL/min (ref >60)
GLUCOSE: 140 mg/dL — AB (ref 65–99)
Potassium: 3.5 mmol/L (ref 3.5–5.1)
SODIUM: 135 mmol/L (ref 135–145)
TOTAL PROTEIN: 7.1 g/dL (ref 6.5–8.1)

## 2018-02-27 LAB — CBC WITH DIFFERENTIAL/PLATELET
Basophils Absolute: 0 10*3/uL (ref 0.0–0.1)
Basophils Relative: 0 %
EOS ABS: 0 10*3/uL (ref 0.0–0.7)
Eosinophils Relative: 0 %
HEMATOCRIT: 47.9 % — AB (ref 36.0–46.0)
HEMOGLOBIN: 15.6 g/dL — AB (ref 12.0–15.0)
LYMPHS ABS: 1.1 10*3/uL (ref 0.7–4.0)
Lymphocytes Relative: 12 %
MCH: 30.5 pg (ref 26.0–34.0)
MCHC: 32.6 g/dL (ref 30.0–36.0)
MCV: 93.7 fL (ref 78.0–100.0)
Monocytes Absolute: 0.4 10*3/uL (ref 0.1–1.0)
Monocytes Relative: 5 %
NEUTROS ABS: 7.5 10*3/uL (ref 1.7–7.7)
NEUTROS PCT: 83 %
Platelets: 224 10*3/uL (ref 150–400)
RBC: 5.11 MIL/uL (ref 3.87–5.11)
RDW: 13.9 % (ref 11.5–15.5)
WBC: 9 10*3/uL (ref 4.0–10.5)

## 2018-02-27 LAB — GLUCOSE, CAPILLARY
GLUCOSE-CAPILLARY: 196 mg/dL — AB (ref 65–99)
Glucose-Capillary: 119 mg/dL — ABNORMAL HIGH (ref 65–99)
Glucose-Capillary: 160 mg/dL — ABNORMAL HIGH (ref 65–99)
Glucose-Capillary: 148 mg/dL — ABNORMAL HIGH (ref 65–99)

## 2018-02-27 LAB — PHOSPHORUS: Phosphorus: 4.7 mg/dL — ABNORMAL HIGH (ref 2.5–4.6)

## 2018-02-27 LAB — MAGNESIUM: Magnesium: 2.7 mg/dL — ABNORMAL HIGH (ref 1.7–2.4)

## 2018-02-27 MED ORDER — IPRATROPIUM-ALBUTEROL 0.5-2.5 (3) MG/3ML IN SOLN
3.0000 mL | Freq: Four times a day (QID) | RESPIRATORY_TRACT | Status: DC
  Administered 2018-02-27 – 2018-02-28 (×3): 3 mL via RESPIRATORY_TRACT
  Filled 2018-02-27 (×3): qty 3

## 2018-02-27 NOTE — Progress Notes (Signed)
PROGRESS NOTE    Karen Perez  GUY:[id number] DOB: September 14, 1951 DOA: 02/22/2018 PCP: Lyndon Code, MD   Brief Narrative:  Karen Perez is a 67 y.o. female with history of COPD with ongoing tobacco abuse, cerebral aneurysm status post clipping and coiling, hypertension, diabetes mellitus type 2 was referred to the ER after patient was found to be hypoxic at primary care's office.  Patient had gone for a routine visit when patient was found to be hypoxic.  As per the patient's daughter patient has been increasingly short of breath last few months and gets short of breath with minimal exertion.  Has not had any chest pain fever chills.  Has been having some cough off and on.  In the ER patient is found to be hypoxic and was mildly confused. Chest x-ray shows congestion and cardiomegaly.  EKG shows normal sinus rhythm.  On exam patient has bilateral expiratory wheeze.  Patient was given Solu-Medrol, nebulizer treatments for COPD, and Lasix for possible CHF. A CTA was done to r/o PE and showed no PE but did show bilateral PNA. She was started on CAP coverage. Continued to wear BiPAP as she could not be weaned to [facility] yesterday AM but is on 5 Liters of O2 via [place] today. She is continued to be diuresed and treated for PNA, COPD, OHS/OSA and CHF and is improving. Ambulatory O2 Screen done and showed patient desaturated and needs Home O2. Repeat Ambulatory Pulse Ox Screening this AM  Patient's daughter feels like she wont able to take care of her mother currently, but Daughter has asked for Short term SNF placement. Social worker discussing facilities with daughter and to follow up with them and should have a decision about SNF in AM.    Assessment & Plan:   Principal Problem:   Acute respiratory failure with hypoxia (HCC) Active Problems:   OBESITY, MORBID   TOBACCO USER   OBSTRUCTIVE SLEEP APNEA   Essential hypertension   CEREBRAL ANEURYSM   COPD with acute exacerbation (HCC)   Diabetes mellitus  type 2 in obese (HCC)   Acute respiratory failure with hypoxia and hypercapnia (HCC)  Acute Respiratory Failure with Hypoxia and Hypercarbia requiring NIPPV with BiPAP likely from multifactorial causes (CAP, Acute Exacerbation of of COPD, Acute Decompensation of Diastolic CHF, OSH/OSA), improving slowly -Placed in SDU  -Patient symptoms likely from a combination of CAP, COPD/Sleep apnea and CHF; PE ruled out.   -C/w BiPAP as tolerated and wean to Supplemental O2 as tolerlared. *Was placed on CPAP mode on the V60 BIPAP again last night  -D Dimer slightly high at 0.68 ordered CTA of Chest to r/o PE given Hypoxia and Increased WOB; CTA showed no PE but did show Left > Right LL Consolidations, Mild-to-moderate Cardiomegaly, Aortic Atherosclerosis, Mild Upper Lobe Emphysema. And an 18 mm Right Lobe Thyroid Nodule -Continue with nebulizer treatments with DuoNeb 3 mL q4h and q2hprn wheezing along with Budesonide 0.25 mg Neb BID; Held Home Spiriva and Advair (given that she is on Budesonide)  -Added Abx with po Doxycycline for COPD 100 mg po BID x 5 days but changed to IV Ceftriaxone and IV Azithromycin for CAP coverage given CAP;  -IV Solumedrol  wean to 60 mg q12h to IV 40 mg q12h and will change to 40 mg IV Daily today ; Given 125 mg Solumedrol in ED - Added Protonix 40 mg for GI Prophylaxis given that patient is on IV Steroids -C/w IV Furosemide 80 mg BID again today  and re-evaluate transition to po in AM  -BNP was 45.3 but maybe artificially low in obesity -Checked Transthoracic Echocardiogram and showed EF of 65-70% and G1DD -Strict I's/O's, Daily Weights, SLIV; Patient is -8,360 mL so far and Weight is down 7 lbs -Cycled Cardiac Troponins and Negative at <0.03 x3 -Continue to Monitor Respiratory Status Closely and continue BiPAP/CPAP qHS and PRN -Checked Respiratory Virus Panel and Influenza A/B PCR and was Negative; Droplet Precautions Discontinued -Maintain O2 Saturations >92% and Continue to  Wean O2 requirements  -Add Flutter Valve and Incentive Spirometry  -CXR done yesterday showed Stable cardiomediastinal silhouette. Normal pulmonary vascularity. Persistent low lung volumes with slightly improved aeration the left lung base. No focal consolidation, pleural effusion, or pneumothorax. No acute osseous abnormality. -PT/OT Evaluate and Treat and recommending Home Health PT;Supervision/Assistance  -Ambulatory Pulse Oximetry Screening done yesterday AM and patient desturated to 80's on Room Air; Will need to arrange Home O2 at D/C but for now will repeat Ambulatory Pulse Oximetry Screening this AM (pending)  -Continue to Ambulate in the Halls  Essential Hypertension  -BP this AM was 129/75 -C/w Home Lisinopril 2.5 mg po Daily -C/w Home Amlodipine 10 mg po Daily (per report patient not taking) -C/w IV Hydralazine 10 mg IV q4hprn for SBP >140   Diabetes Mellitus Type 2 -Hold home Kombiglyze XR 5-500 mg po Daily  -Patient has been placed on Sensitive Novolog SSI AC -Checked HbA1c and was 5.6 -Closely follow CBGs since patient is on steroids; CBG's ranging from 119-216  History of Sleep Apnea -See Above -Placed on CPAP Mode again last night  -Patient is noncompliant per Daughters report to admitting physician   Tobacco Abuse -Tobacco cessation counseling requested. -Discussed with patient about Nicotine Patch and started 14 mg TD  History of Cerebral Aneurysm status post clipping and coiling with history of stroke  -C/w Atorvastatin 20 mg po Daily and Clopidogrel 75 mg po Daily  Morbid Obesity. -Patient's BMI was 43.89 kg/m2 on admission and now 42.64 from Diuresis  -Weight Loss Counseling Given   Thyroid Nodule -Noted on CTA of Chest -18 mm on Right Side -Checked TSH and was 0.415 and FT4 was 0.88 -Will need full Outpatient Workup   Hyponatremia/Hypochloremia -Mild. Na+ was 133 and Chloride was 89; Na+ now 135 and Chloride 89 -Likely from Diuresis -Continue to  Monitor and Repeat CMP in AM  Hyperphosphatemia/Hypermagnesemia -Patient's Phos was 4.7 and Mag was 2.7 -Continue to Monitor and Repeat in AM  Erythrocytosis -Slight. Likely 2/2 to Tobacco Abuse -Hb/Hct was 15.6/47.9 this AM -Repeat CBC in AM   DVT prophylaxis: Enoxaparin 60 mg sq q24h Code Status: FULL CODE Family Communication: No family present at bedside Disposition Plan: Remain Inpatient for further workup and anticipate D/C Home with Home Health in next 24-48 hours vs. SNF  Consultants:   None   Procedures:  ECHOCARDIOGRAM ------------------------------------------------------------------- Study Conclusions  - Left ventricle: The cavity size was normal. There was severe   concentric hypertrophy. Systolic function was vigorous. The   estimated ejection fraction was in the range of 65% to 70%. Wall   motion was normal; there were no regional wall motion   abnormalities. Doppler parameters are consistent with abnormal   left ventricular relaxation (grade 1 diastolic dysfunction).   There was no evidence of elevated ventricular filling pressure by   Doppler parameters. - Aortic valve: There was no regurgitation. - Aortic root: The aortic root was normal in size. - Mitral valve: There was no regurgitation. -  Left atrium: The atrium was mildly dilated. - Right ventricle: Systolic function was normal. - Right atrium: The atrium was normal in size. - Tricuspid valve: There was mild regurgitation. - Pulmonic valve: There was no regurgitation. - Pulmonary arteries: Systolic pressure was within the normal   range. - Inferior vena cava: The vessel was normal in size. - Pericardium, extracardiac: There was no pericardial effusion.   Antimicrobials:  Anti-infectives (From admission, onward)   Start     Dose/Rate Route Frequency Ordered Stop   02/24/18 0913  azithromycin (ZITHROMAX) tablet 500 mg     500 mg Oral Daily 02/24/18 0913 02/28/18 1459   02/23/18 1500   cefTRIAXone (ROCEPHIN) 1 g in sodium chloride 0.9 % 100 mL IVPB     1 g 200 mL/hr over 30 Minutes Intravenous Every 24 hours 02/23/18 1420     02/23/18 1500  azithromycin (ZITHROMAX) 500 mg in sodium chloride 0.9 % 250 mL IVPB  Status:  Discontinued     500 mg 250 mL/hr over 60 Minutes Intravenous Every 24 hours 02/23/18 1420 02/24/18 0913   02/23/18 1000  doxycycline (VIBRA-TABS) tablet 100 mg  Status:  Discontinued     100 mg Oral Every 12 hours 02/23/18 0959 02/23/18 1420     Subjective: Seen and examined this AM had no complaints.  She was examined on CPAP and was slightly difficult to understand her. She denied any shortness of breath or chest pain.  Hoping she can go home soon. No other complaints or concerns at this time  Objective: Vitals:   02/27/18 0100 02/27/18 0208 02/27/18 0447 02/27/18 0729  BP:      Pulse: 75   63  Resp:    19  Temp:    (!) 97.4 F (36.3 C)  TempSrc:    Oral  SpO2: 99%   100%  Weight:  123.1 kg (271 lb 6.2 oz) 123.5 kg (272 lb 4.3 oz)   Height:        Intake/Output Summary (Last 24 hours) at 02/27/2018 0749 Last data filed at 02/27/2018 0100 Gross per 24 hour  Intake 840 ml  Output 3400 ml  Net -2560 ml   Filed Weights   02/26/18 0303 02/27/18 0208 02/27/18 0447  Weight: 123.2 kg (271 lb 9.7 oz) 123.1 kg (271 lb 6.2 oz) 123.5 kg (272 lb 4.3 oz)   Examination: Physical Exam:  Constitutional: Well-nourished well-developed morbidly obese African-American female who is currently in no acute distress and is calm wearing the CPAP. Eyes: Sclera anicteric, lids and conjunctiva normal. ENMT: External ears and nose appear normal.  Mucous membranes moist.  Grossly normal hearing. Neck: Supple with difficulty in assessing JVD. Respiratory: Diminished with some expiratory wheezing today. Unlabored breathing however she is currently wearing a CPAP Cardiovascular: Regular rate and rhythm, no appreciable murmurs, rubs, gallops.  Trace lower extremity  edema Abdomen: Soft, nontender, distended secondary to body habitus.  Bowel sounds present x4. GU: Deferred Musculoskeletal:  No contractures, no cyanosis.  Skin: Warm and dry with no appreciable rashes or lesions non-limited skin evaluation. Neurologic:. Cranial nerves II through XII grossly intact.  No appreciable focal deficits. Psychiatric: Normal mood and affect.  Intact judgment and insight.  Alert and awake  Data Reviewed: I have personally reviewed following labs and imaging studies  CBC: Recent Labs  Lab 02/23/18 0545 02/24/18 0350 02/25/18 0327 02/26/18 0245 02/27/18 0149  WBC 6.0 6.7 8.0 9.1 9.0  NEUTROABS  --  5.8 6.7 7.8* 7.5  HGB 14.5  14.0 14.6 14.9 15.6*  HCT 48.1* 46.3* 47.9* 47.2* 47.9*  MCV 99.0 99.1 97.8 95.2 93.7  PLT 210 223 210 215 224   Basic Metabolic Panel: Recent Labs  Lab 02/23/18 0545 02/24/18 0350 02/25/18 0327 02/26/18 0245 02/27/18 0149  NA 141 141 139 133* 135  K 4.3 4.0 3.5 3.9 3.5  CL 98* 94* 91* 89* 88*  CO2 30 37* 37* 32 37*  GLUCOSE 167* 137* 149* 139* 140*  BUN 6 12 17  23* 26*  CREATININE 0.76 0.70 0.74 0.82 0.81  CALCIUM 8.6* 8.6* 8.7* 8.5* 8.5*  MG  --  2.4 2.4 2.3 2.7*  PHOS  --  4.1 3.9 3.5 4.7*   GFR: Estimated Creatinine Clearance: 90.7 mL/min (by C-G formula based on SCr of 0.81 mg/dL). Liver Function Tests: Recent Labs  Lab 02/24/18 0350 02/25/18 0327 02/26/18 0245 02/27/18 0149  AST 13* 14* 14* 15  ALT 12* 13* 11* 15  ALKPHOS 59 53 58 56  BILITOT 0.6 0.5 0.6 0.6  PROT 7.1 7.2 6.9 7.1  ALBUMIN 3.8 3.6 3.6 3.6   No results for input(s): LIPASE, AMYLASE in the last 168 hours. No results for input(s): AMMONIA in the last 168 hours. Coagulation Profile: No results for input(s): INR, PROTIME in the last 168 hours. Cardiac Enzymes: Recent Labs  Lab 02/23/18 0125 02/23/18 0545 02/23/18 1137  TROPONINI <0.03 <0.03 <0.03   BNP (last 3 results) No results for input(s): PROBNP in the last 8760  hours. HbA1C: No results for input(s): HGBA1C in the last 72 hours. CBG: Recent Labs  Lab 02/26/18 1234 02/26/18 1624 02/26/18 1750 02/26/18 2116 02/27/18 0733  GLUCAP 138* 182* 168* 216* 119*   Lipid Profile: No results for input(s): CHOL, HDL, LDLCALC, TRIG, CHOLHDL, LDLDIRECT in the last 72 hours. Thyroid Function Tests: Recent Labs    02/24/18 0753  FREET4 0.88   Anemia Panel: No results for input(s): VITAMINB12, FOLATE, FERRITIN, TIBC, IRON, RETICCTPCT in the last 72 hours. Sepsis Labs: No results for input(s): PROCALCITON, LATICACIDVEN in the last 168 hours.  Recent Results (from the past 240 hour(s))  MRSA PCR Screening     Status: None   Collection Time: 02/23/18  3:16 PM  Result Value Ref Range Status   MRSA by PCR NEGATIVE NEGATIVE Final    Comment:        The GeneXpert MRSA Assay (FDA approved for NASAL specimens only), is one component of a comprehensive MRSA colonization surveillance program. It is not intended to diagnose MRSA infection nor to guide or monitor treatment for MRSA infections. Performed at [facility], [address] [place], [place] [address]   Respiratory Panel by PCR     Status: None   Collection Time: 02/24/18 12:09 PM  Result Value Ref Range Status   Adenovirus NOT DETECTED NOT DETECTED Final   Coronavirus 229E NOT DETECTED NOT DETECTED Final   Coronavirus HKU1 NOT DETECTED NOT DETECTED Final   Coronavirus NL63 NOT DETECTED NOT DETECTED Final   Coronavirus OC43 NOT DETECTED NOT DETECTED Final   Metapneumovirus NOT DETECTED NOT DETECTED Final   Rhinovirus / Enterovirus NOT DETECTED NOT DETECTED Final   Influenza A NOT DETECTED NOT DETECTED Final   Influenza B NOT DETECTED NOT DETECTED Final   Parainfluenza Virus 1 NOT DETECTED NOT DETECTED Final   Parainfluenza Virus 2 NOT DETECTED NOT DETECTED Final   Parainfluenza Virus 3 NOT DETECTED NOT DETECTED Final   Parainfluenza Virus 4 NOT DETECTED NOT DETECTED Final  Respiratory Syncytial Virus NOT DETECTED NOT DETECTED Final   Bordetella pertussis NOT DETECTED NOT DETECTED Final   Chlamydophila pneumoniae NOT DETECTED NOT DETECTED Final   Mycoplasma pneumoniae NOT DETECTED NOT DETECTED Final    Radiology Studies: Dg Chest Port 1 View  Result Date: 02/26/2018 CLINICAL DATA:  Shortness of breath and wheezing. EXAM: PORTABLE CHEST 1 VIEW COMPARISON:  Chest x-ray from yesterday. FINDINGS: Stable cardiomediastinal silhouette. Normal pulmonary vascularity. Persistent low lung volumes with slightly improved aeration the left lung base. No focal consolidation, pleural effusion, or pneumothorax. No acute osseous abnormality. IMPRESSION: Slightly improved aeration in the left lower lobe. Electronically Signed   By: Obie Dredge M.D.   On: 02/26/2018 08:02   Scheduled Meds: . amLODipine  10 mg Oral Daily  . atorvastatin  20 mg Oral Daily  . azithromycin  500 mg Oral Daily  . budesonide (PULMICORT) nebulizer solution  0.25 mg Nebulization BID  . clopidogrel  75 mg Oral Daily  . enoxaparin (LOVENOX) injection  60 mg Subcutaneous Q24H  . furosemide  80 mg Intravenous Q12H  . guaiFENesin  1,200 mg Oral BID  . insulin aspart  0-9 Units Subcutaneous TID WC  . ipratropium-albuterol  3 mL Nebulization QID  . lisinopril  2.5 mg Oral Daily  . mouth rinse  15 mL Mouth Rinse BID  . methylPREDNISolone (SOLU-MEDROL) injection  40 mg Intravenous Q12H  . nicotine  14 mg Transdermal Daily  . pantoprazole  40 mg Oral Daily   Continuous Infusions: . cefTRIAXone (ROCEPHIN)  IV Stopped (02/26/18 1555)    LOS: 5 days   Merlene Laughter, DO [facility] Pager [phone]  If 7PM-7AM, please contact night-coverage [email or web address] Password [facility] 02/27/2018, 7:49 AM

## 2018-02-27 NOTE — Social Work (Signed)
CSW spoke with pt daughter, their choices for SNF in [facility]: [facility].   CSW to f/u with facilities, aware discharge is likely within 24-48hrs.   Doy HutchingIsabel H Jullianna Gabor, LCSWA [facility] Clinical Social Work [phone]

## 2018-02-27 NOTE — Progress Notes (Signed)
Rn placed pt on cpap for the night RT to cont to monitor.

## 2018-02-28 ENCOUNTER — Inpatient Hospital Stay (HOSPITAL_COMMUNITY): Payer: Medicare HMO

## 2018-02-28 DIAGNOSIS — D72829 Elevated white blood cell count, unspecified: Secondary | ICD-10-CM

## 2018-02-28 DIAGNOSIS — D751 Secondary polycythemia: Secondary | ICD-10-CM

## 2018-02-28 LAB — COMPREHENSIVE METABOLIC PANEL
ALBUMIN: 3.6 g/dL (ref 3.5–5.0)
ALT: 19 U/L (ref 14–54)
ANION GAP: 9 (ref 5–15)
AST: 14 U/L — ABNORMAL LOW (ref 15–41)
Alkaline Phosphatase: 57 U/L (ref 38–126)
BILIRUBIN TOTAL: 0.8 mg/dL (ref 0.3–1.2)
BUN: 24 mg/dL — ABNORMAL HIGH (ref 6–20)
CHLORIDE: 88 mmol/L — AB (ref 101–111)
CO2: 38 mmol/L — AB (ref 22–32)
Calcium: 8.8 mg/dL — ABNORMAL LOW (ref 8.9–10.3)
Creatinine, Ser: 0.86 mg/dL (ref 0.44–1.00)
GFR calc Af Amer: >60 mL/min (ref >60)
GFR calc non Af Amer: >60 mL/min (ref >60)
GLUCOSE: 138 mg/dL — AB (ref 65–99)
POTASSIUM: 3.5 mmol/L (ref 3.5–5.1)
SODIUM: 135 mmol/L (ref 135–145)
TOTAL PROTEIN: 7.1 g/dL (ref 6.5–8.1)

## 2018-02-28 LAB — CBC WITH DIFFERENTIAL/PLATELET
BASOS PCT: 0 %
Basophils Absolute: 0 10*3/uL (ref 0.0–0.1)
EOS ABS: 0 10*3/uL (ref 0.0–0.7)
EOS PCT: 0 %
HCT: 50.2 % — ABNORMAL HIGH (ref 36.0–46.0)
Hemoglobin: 16 g/dL — ABNORMAL HIGH (ref 12.0–15.0)
Lymphocytes Relative: 11 %
Lymphs Abs: 1.2 10*3/uL (ref 0.7–4.0)
MCH: 30.3 pg (ref 26.0–34.0)
MCHC: 31.9 g/dL (ref 30.0–36.0)
MCV: 95.1 fL (ref 78.0–100.0)
MONO ABS: 0.7 10*3/uL (ref 0.1–1.0)
Monocytes Relative: 6 %
Neutro Abs: 9.2 10*3/uL — ABNORMAL HIGH (ref 1.7–7.7)
Neutrophils Relative %: 83 %
PLATELETS: 216 10*3/uL (ref 150–400)
RBC: 5.28 MIL/uL — ABNORMAL HIGH (ref 3.87–5.11)
RDW: 14 % (ref 11.5–15.5)
WBC: 11.1 10*3/uL — ABNORMAL HIGH (ref 4.0–10.5)

## 2018-02-28 LAB — GLUCOSE, CAPILLARY
GLUCOSE-CAPILLARY: 106 mg/dL — AB (ref 65–99)
GLUCOSE-CAPILLARY: 196 mg/dL — AB (ref 65–99)
Glucose-Capillary: 167 mg/dL — ABNORMAL HIGH (ref 65–99)
Glucose-Capillary: 215 mg/dL — ABNORMAL HIGH (ref 65–99)

## 2018-02-28 LAB — PHOSPHORUS: Phosphorus: 5.3 mg/dL — ABNORMAL HIGH (ref 2.5–4.6)

## 2018-02-28 LAB — MAGNESIUM: Magnesium: 2.7 mg/dL — ABNORMAL HIGH (ref 1.7–2.4)

## 2018-02-28 MED ORDER — IPRATROPIUM-ALBUTEROL 0.5-2.5 (3) MG/3ML IN SOLN
3.0000 mL | Freq: Two times a day (BID) | RESPIRATORY_TRACT | Status: DC
  Administered 2018-02-28 – 2018-03-02 (×5): 3 mL via RESPIRATORY_TRACT
  Filled 2018-02-28 (×5): qty 3

## 2018-02-28 MED ORDER — METHYLPREDNISOLONE SODIUM SUCC 125 MG IJ SOLR
60.0000 mg | Freq: Two times a day (BID) | INTRAMUSCULAR | Status: DC
  Administered 2018-02-28 – 2018-03-01 (×2): 60 mg via INTRAVENOUS
  Filled 2018-02-28 (×2): qty 2

## 2018-02-28 NOTE — Consult Note (Signed)
            [facility] CM Primary Care Navigator  02/28/2018  Karen PolingLinda J Perez 04/20/1951 [id number]  Went to seepatientat the bedside toidentify possible discharge needs. Patientreportsof "wheezing" and was hypoxic at primary care provider's office during her recent visit which resulted tothis admission.  PatientendorsesDr.Fozia Welton FlakesKhan with [facility] as her primary care provider.   [facility] [facility].  Patientreports managingherown medications at home straight out of the containers.  Patient statesthatshe was driving prior to admission but daughter Inetta Fermo(Tina) or son Dorinda Hill(Donald) willbe able to providetransportation toherdoctors'appointments after discharge.  Patientreports living with her daughter who is the primary caregiver at home.  Anticipated plan for discharge isskilled nursing facility (SNF) per therapy recommendation. [place] [facility] [place].  Patientvoiced understandingto callprimarycareprovider's office when she returns back home,for a post discharge follow-upvisitwithin1- 2 weeksor sooner if needs arise.Patient letter (with PCP's contact number) was provided asa reminder.   Discussed with patientregarding [facility] CM services available for health management/ resourcesat homeand sheindicated her interest for it.  Patient is planning todiscuss with primary care provider onher next visit, for further health managementservicesthatshewillbe needing- once discharged home. Patient verbalized understandingof needto seekreferral from primary care provider to [facility] care management ifdeemed necessary and appropriatefor anyservicesin the nearfuture- when discharged back home.   [facility] care management information was provided for futureneedsthatshe may have.   For additional questions please contact:  Karin GoldenLorraine A. Callen Vancuren, BSN,  RN-BC [facility] PRIMARY CARE Navigator Cell: [phone]

## 2018-02-28 NOTE — Progress Notes (Signed)
PROGRESS NOTE    [name]  ZOX:[id number] DOB: Sep 22, 1951 DOA: 02/22/2018 PCP: Lyndon Code, MD   Brief Narrative:  Karen Perez is a 67 y.o. female with history of COPD with ongoing tobacco abuse, cerebral aneurysm status post clipping and coiling, hypertension, diabetes mellitus type 2 was referred to the ER after patient was found to be hypoxic at primary care's office.  Patient had gone for a routine visit when patient was found to be hypoxic.  As per the patient's daughter patient has been increasingly short of breath last few months and gets short of breath with minimal exertion.  Has not had any chest pain fever chills.  Has been having some cough off and on.  In the ER patient is found to be hypoxic and was mildly confused. Chest x-ray shows congestion and cardiomegaly.  EKG shows normal sinus rhythm.  On exam patient has bilateral expiratory wheeze.  Patient was given Solu-Medrol, nebulizer treatments for COPD, and Lasix for possible CHF. A CTA was done to r/o PE and showed no PE but did show bilateral PNA. She was started on CAP coverage. Continued to wear BiPAP as she could not be weaned to [facility] yesterday AM but is on 5 Liters of O2 via [place] today. She is continued to be diuresed and treated for PNA, COPD, OHS/OSA and CHF and is improving. Ambulatory O2 Screen done and showed patient desaturated and needs Home O2. Repeat Ambulatory Pulse Ox Screening this AM  Patient's daughter feels like she wont able to take care of her mother currently, but Daughter has asked for Short term SNF placement. Social worker discussing facilities with daughter and to follow up with them and should have a decision about SNF in AM. Will discuss with the Child psychotherapist this AM about placement options and anticipate D/C to SNF in 24-48 hours if Insurance has authorized approval.    Assessment & Plan:   Principal Problem:   Acute respiratory failure with hypoxia (HCC) Active Problems:   OBESITY, MORBID  TOBACCO USER   OBSTRUCTIVE SLEEP APNEA   Essential hypertension   CEREBRAL ANEURYSM   COPD with acute exacerbation (HCC)   Diabetes mellitus type 2 in obese (HCC)   Acute respiratory failure with hypoxia and hypercapnia (HCC)  Acute Respiratory Failure with Hypoxia and Hypercarbia requiring NIPPV with BiPAP likely from multifactorial causes (CAP, Acute Exacerbation of of COPD, Acute Decompensation of Diastolic CHF, OSH/OSA), improving  -Placed in SDU  -Patient symptoms likely from a combination of CAP, COPD/Sleep apnea and CHF; PE ruled out.   -C/w BiPAP as tolerated and wean to Supplemental O2 as tolerlared. *C/w CPAP mode on the V60 BIPAP  -D Dimer slightly high at 0.68 ordered CTA of Chest to r/o PE given Hypoxia and Increased WOB; CTA showed no PE but did show Left > Right LL Consolidations, Mild-to-moderate Cardiomegaly, Aortic Atherosclerosis, Mild Upper Lobe Emphysema. And an 18 mm Right Lobe Thyroid Nodule -Continue with nebulizer treatments with DuoNeb 3 mL q4h and q2hprn wheezing along with Budesonide 0.25 mg Neb BID; Held Home Spiriva and Advair (given that she is on Budesonide and will resume Home meds at D/C) -Added Abx with po Doxycycline for COPD 100 mg po BID x 5 days but changed to IV Ceftriaxone and IV Azithromycin for CAP coverage given CAP; Day 5 of Abx for CAP -Changed IV Solumedrol 40 mg IV BID back to 60 mg IV BID today given her present wheezing and will change to po Taper  at D/C; Given 125 mg Solumedrol in ED - Added Protonix 40 mg for GI Prophylaxis given that patient is on IV Steroids -C/w IV Furosemide 80 mg BID again today and re-evaluate transition to po 40 mg BID when going to SNF -BNP was 45.3 but maybe artificially low in obesity -Checked Transthoracic Echocardiogram and showed EF of 65-70% and G1DD -Strict I's/O's, Daily Weights, SLIV; Patient is -9,090 mL so far and Weight is down 7 lbs -Cycled Cardiac Troponins and Negative at <0.03 x3 -Continue to Monitor  Respiratory Status Closely and continue BiPAP/CPAP qHS and PRN -Checked Respiratory Virus Panel and Influenza A/B PCR and was Negative; Droplet Precautions Discontinued -Maintain O2 Saturations >92% and Continue to Wean O2 requirements  -Add Flutter Valve and Incentive Spirometry  -CXR this AM showed The heart size is exaggerated by low lung volumes. Mild pulmonary vascular congestion is present without frank edema. There are no effusions. Bibasilar airspace disease likely reflects atelectasis and overall impression is continued improvement in the aeration at both lung bases with some residual ATX with Left > Right   -PT/OT Evaluate and Treat and recommending Home Health PT;Supervision/Assistance  -Ambulatory Pulse Oximetry Screening done 2 days ago and patient desturated to 80's on Room Air; Will need to arrange Home O2 at D/C but for now will repeat O2 Ambulatory Pulse Oximetry Screening this AM -Continue to Ambulate in the Halls -Repeat CXR in AM   Essential Hypertension  -BP this AM was 133/80 -C/w Home Lisinopril 2.5 mg po Daily -C/w Home Amlodipine 10 mg po Daily (per report patient not taking) -C/w IV Hydralazine 10 mg IV q4hprn for SBP >140   Diabetes Mellitus Type 2 -Hold home Kombiglyze XR 5-500 mg po Daily  -Patient has been placed on Sensitive Novolog SSI AC -Checked HbA1c and was 5.6 -Closely follow CBGs since patient is on steroids; CBG's ranging from 106-196  History of Sleep Apnea -See Above -Placed on CPAP Mode again last night  -Patient is noncompliant per Daughters report to admitting physician   Tobacco Abuse -Tobacco cessation counseling requested. -Discussed with patient about Nicotine Patch and started 14 mg TD  History of Cerebral Aneurysm status post clipping and coiling with history of stroke  -C/w Atorvastatin 20 mg po Daily and Clopidogrel 75 mg po Daily  Morbid Obesity. -Patient's BMI was 43.89 kg/m2 on admission and now 42.54 from Diuresis  -Weight  Loss Counseling Given   Thyroid Nodule -Noted on CTA of Chest -18 mm on Right Side -Checked TSH and was 0.415 and FT4 was 0.88 -Will need full Outpatient Workup   Hyponatremia/Hypochloremia -Mild. Na+ was 133 and Chloride was 89; Na+ now 135 and Chloride 88 -Likely from Diuresis -Continue to Monitor and Repeat CMP in AM  Hyperphosphatemia/Hypermagnesemia -Patient's Phos was 4.7 and Mag was 2.7 yesterday and today was 5.3 and Mag was 2.7  -Continue to Monitor and Repeat in AM  Erythrocytosis -Slight. Likely 2/2 to Tobacco Abuse -Hb/Hct was 15.6/47.9 and went to 16.0/50.2  -Repeat CBC in AM   Leukocytosis -Mild at 11.1 and up from 9.0 -Likely IV Steroid Demargination -Continue to Monitor for S/Sx of Infection -Repeat CBC in AM  DVT prophylaxis: Enoxaparin 60 mg sq q24h Code Status: FULL CODE Family Communication: No family present at bedside Disposition Plan: Remain Inpatient for further workup and anticipate D/C Home with Home Health in next 24-48 hours vs. SNF  Consultants:   None   Procedures:  ECHOCARDIOGRAM ------------------------------------------------------------------- Study Conclusions  - Left ventricle: The cavity  size was normal. There was severe   concentric hypertrophy. Systolic function was vigorous. The   estimated ejection fraction was in the range of 65% to 70%. Wall   motion was normal; there were no regional wall motion   abnormalities. Doppler parameters are consistent with abnormal   left ventricular relaxation (grade 1 diastolic dysfunction).   There was no evidence of elevated ventricular filling pressure by   Doppler parameters. - Aortic valve: There was no regurgitation. - Aortic root: The aortic root was normal in size. - Mitral valve: There was no regurgitation. - Left atrium: The atrium was mildly dilated. - Right ventricle: Systolic function was normal. - Right atrium: The atrium was normal in size. - Tricuspid valve: There was  mild regurgitation. - Pulmonic valve: There was no regurgitation. - Pulmonary arteries: Systolic pressure was within the normal   range. - Inferior vena cava: The vessel was normal in size. - Pericardium, extracardiac: There was no pericardial effusion.   Antimicrobials:  Anti-infectives (From admission, onward)   Start     Dose/Rate Route Frequency Ordered Stop   02/24/18 0913  azithromycin (ZITHROMAX) tablet 500 mg     500 mg Oral Daily 02/24/18 0913 02/27/18 1528   02/23/18 1500  cefTRIAXone (ROCEPHIN) 1 g in sodium chloride 0.9 % 100 mL IVPB     1 g 200 mL/hr over 30 Minutes Intravenous Every 24 hours 02/23/18 1420     02/23/18 1500  azithromycin (ZITHROMAX) 500 mg in sodium chloride 0.9 % 250 mL IVPB  Status:  Discontinued     500 mg 250 mL/hr over 60 Minutes Intravenous Every 24 hours 02/23/18 1420 02/24/18 0913   02/23/18 1000  doxycycline (VIBRA-TABS) tablet 100 mg  Status:  Discontinued     100 mg Oral Every 12 hours 02/23/18 0959 02/23/18 1420     Subjective: Seen and examined and Pulse Ox Reading was Low however she was in NAD but had some mild wheezing. Pulse Oximetry Reading was on Finger with painted nails and when switched to ear lobe read 93% on Room Air. States breathing has improved and denied any CP. No lightheadedness or dizziness and asking when she can go home.    Objective: Vitals:   02/28/18 0415 02/28/18 0452 02/28/18 0722 02/28/18 0739  BP:   139/82   Pulse: 73  67   Resp: (!) 21  19   Temp:   98 F (36.7 C)   TempSrc:   Axillary   SpO2: 96%  93% 93%  Weight:  123.2 kg (271 lb 9.7 oz)    Height:        Intake/Output Summary (Last 24 hours) at 02/28/2018 0751 Last data filed at 02/27/2018 2041 Gross per 24 hour  Intake 120 ml  Output 850 ml  Net -730 ml   Filed Weights   02/27/18 0208 02/27/18 0447 02/28/18 0452  Weight: 123.1 kg (271 lb 6.2 oz) 123.5 kg (272 lb 4.3 oz) 123.2 kg (271 lb 9.7 oz)   Examination: Physical  Exam:  Constitutional: Well-nourished well-developed morbidly obese African-American female who is currently in no acute distress.  She is sitting up asking for a couple of ice and has unlabored breathing but slight expiratory wheezing. Eyes: Sclera anicteric.  Lids and conjunctive are normal ENMT: External ears and nose appear normal.  Mucous membranes moist.  Grossly normal hearing Neck: Supple with no appreciable JVD but is difficult to assess secondary to body habitus Respiratory: Bilateral Expiratory Wheezing noted.  Patient has  unlabored breathing no appreciable rales, rhonchi,or crackles.  No accessory muscle usage Cardiovascular: RRR; Trace LE edema bilaterally  Abdomen: Soft, nontender, distended secondary to body habitus.  Bowel sounds present all 4 quadrants. GU: Deferred Musculoskeletal: No contractures, no cyanosis. Skin: Warm and dry with no appreciable rashes or lesions on a limited skin evaluation Neurologic:.  Nerves II through XII grossly intact.  No appreciable focal deficits Psychiatric: Normal pleasant mood and affect.  Intact judgment and insight.  Patient is awake alert and oriented x3.  Data Reviewed: I have personally reviewed following labs and imaging studies  CBC: Recent Labs  Lab 02/24/18 0350 02/25/18 0327 02/26/18 0245 02/27/18 0149 02/28/18 0243  WBC 6.7 8.0 9.1 9.0 11.1*  NEUTROABS 5.8 6.7 7.8* 7.5 9.2*  HGB 14.0 14.6 14.9 15.6* 16.0*  HCT 46.3* 47.9* 47.2* 47.9* 50.2*  MCV 99.1 97.8 95.2 93.7 95.1  PLT 223 210 215 224 216   Basic Metabolic Panel: Recent Labs  Lab 02/24/18 0350 02/25/18 0327 02/26/18 0245 02/27/18 0149 02/28/18 0243  NA 141 139 133* 135 135  K 4.0 3.5 3.9 3.5 3.5  CL 94* 91* 89* 88* 88*  CO2 37* 37* 32 37* 38*  GLUCOSE 137* 149* 139* 140* 138*  BUN 12 17 23* 26* 24*  CREATININE 0.70 0.74 0.82 0.81 0.86  CALCIUM 8.6* 8.7* 8.5* 8.5* 8.8*  MG 2.4 2.4 2.3 2.7* 2.7*  PHOS 4.1 3.9 3.5 4.7* 5.3*   GFR: Estimated Creatinine  Clearance: 85.2 mL/min (by C-G formula based on SCr of 0.86 mg/dL). Liver Function Tests: Recent Labs  Lab 02/24/18 0350 02/25/18 0327 02/26/18 0245 02/27/18 0149 02/28/18 0243  AST 13* 14* 14* 15 14*  ALT 12* 13* 11* 15 19  ALKPHOS 59 53 58 56 57  BILITOT 0.6 0.5 0.6 0.6 0.8  PROT 7.1 7.2 6.9 7.1 7.1  ALBUMIN 3.8 3.6 3.6 3.6 3.6   No results for input(s): LIPASE, AMYLASE in the last 168 hours. No results for input(s): AMMONIA in the last 168 hours. Coagulation Profile: No results for input(s): INR, PROTIME in the last 168 hours. Cardiac Enzymes: Recent Labs  Lab 02/23/18 0125 02/23/18 0545 02/23/18 1137  TROPONINI <0.03 <0.03 <0.03   BNP (last 3 results) No results for input(s): PROBNP in the last 8760 hours. HbA1C: No results for input(s): HGBA1C in the last 72 hours. CBG: Recent Labs  Lab 02/27/18 0733 02/27/18 1129 02/27/18 1627 02/27/18 2044 02/28/18 0726  GLUCAP 119* 196* 160* 148* 106*   Lipid Profile: No results for input(s): CHOL, HDL, LDLCALC, TRIG, CHOLHDL, LDLDIRECT in the last 72 hours. Thyroid Function Tests: No results for input(s): TSH, T4TOTAL, FREET4, T3FREE, THYROIDAB in the last 72 hours. Anemia Panel: No results for input(s): VITAMINB12, FOLATE, FERRITIN, TIBC, IRON, RETICCTPCT in the last 72 hours. Sepsis Labs: No results for input(s): PROCALCITON, LATICACIDVEN in the last 168 hours.  Recent Results (from the past 240 hour(s))  MRSA PCR Screening     Status: None   Collection Time: 02/23/18  3:16 PM  Result Value Ref Range Status   MRSA by PCR NEGATIVE NEGATIVE Final    Comment:        The GeneXpert MRSA Assay (FDA approved for NASAL specimens only), is one component of a comprehensive MRSA colonization surveillance program. It is not intended to diagnose MRSA infection nor to guide or monitor treatment for MRSA infections. Performed at [facility], [address] [place], [place] [address]   Respiratory Panel by PCR  Status: None   Collection Time: 02/24/18 12:09 PM  Result Value Ref Range Status   Adenovirus NOT DETECTED NOT DETECTED Final   Coronavirus 229E NOT DETECTED NOT DETECTED Final   Coronavirus HKU1 NOT DETECTED NOT DETECTED Final   Coronavirus NL63 NOT DETECTED NOT DETECTED Final   Coronavirus OC43 NOT DETECTED NOT DETECTED Final   Metapneumovirus NOT DETECTED NOT DETECTED Final   Rhinovirus / Enterovirus NOT DETECTED NOT DETECTED Final   Influenza A NOT DETECTED NOT DETECTED Final   Influenza B NOT DETECTED NOT DETECTED Final   Parainfluenza Virus 1 NOT DETECTED NOT DETECTED Final   Parainfluenza Virus 2 NOT DETECTED NOT DETECTED Final   Parainfluenza Virus 3 NOT DETECTED NOT DETECTED Final   Parainfluenza Virus 4 NOT DETECTED NOT DETECTED Final   Respiratory Syncytial Virus NOT DETECTED NOT DETECTED Final   Bordetella pertussis NOT DETECTED NOT DETECTED Final   Chlamydophila pneumoniae NOT DETECTED NOT DETECTED Final   Mycoplasma pneumoniae NOT DETECTED NOT DETECTED Final    Radiology Studies: Dg Chest Port 1 View  Result Date: 02/28/2018 CLINICAL DATA:  Shortness of breath. EXAM: PORTABLE CHEST 1 VIEW COMPARISON:  One-view chest x-ray 02/26/2018 FINDINGS: The heart size is exaggerated by low lung volumes. Mild pulmonary vascular congestion is present without frank edema. There are no effusions. Bibasilar airspace disease likely reflects atelectasis. IMPRESSION: 1. Continued improvement in aeration at both lung bases with some residual atelectasis, left greater than right. Electronically Signed   By: Marin Roberts M.D.   On: 02/28/2018 07:16   Scheduled Meds: . amLODipine  10 mg Oral Daily  . atorvastatin  20 mg Oral Daily  . budesonide (PULMICORT) nebulizer solution  0.25 mg Nebulization BID  . clopidogrel  75 mg Oral Daily  . enoxaparin (LOVENOX) injection  60 mg Subcutaneous Q24H  . furosemide  80 mg Intravenous Q12H  . guaiFENesin  1,200 mg Oral BID  . insulin aspart   0-9 Units Subcutaneous TID WC  . ipratropium-albuterol  3 mL Nebulization BID  . lisinopril  2.5 mg Oral Daily  . mouth rinse  15 mL Mouth Rinse BID  . methylPREDNISolone (SOLU-MEDROL) injection  40 mg Intravenous Q12H  . nicotine  14 mg Transdermal Daily  . pantoprazole  40 mg Oral Daily   Continuous Infusions: . cefTRIAXone (ROCEPHIN)  IV Stopped (02/27/18 1558)    LOS: 6 days   Merlene Laughter, DO [facility] Pager [phone]  If 7PM-7AM, please contact night-coverage [email or web address] Password [facility] 02/28/2018, 7:51 AM

## 2018-02-28 NOTE — Progress Notes (Signed)
Physical Therapy Treatment Patient Details Name: Karen Perez MRN: [id number] DOB: [birth year] Today's Date: 02/28/2018    History of Present Illness Pt is a 67 y/o female admitted secondary to hypoxia from CAP/COPD exacerbation/CHF. PMH including but not limited to COPD, HTN, DM and hx of CVA.    PT Comments    Continuing work on functional mobility and activity tolerance;  Able to progress mobility slowly, still needing physical assist to safely transfer and walk about the room; Discussed with SW who indicated family is concerned about ability to take care of pt; given steady progress, I believe post-acute rehab at SNF is a good option to maximize independence and safety with mobility prior to dc home   Follow Up Recommendations  SNF(for post-acute rehab)     Equipment Recommendations  None recommended by PT    Recommendations for Other Services       Precautions / Restrictions Precautions Precautions: Fall    Mobility  Bed Mobility Overal bed mobility: Needs Assistance Bed Mobility: Supine to Sit     Supine to sit: Min guard     General bed mobility comments: increased time and effort, HOB elevated  Transfers Overall transfer level: Needs assistance Equipment used: Rolling walker (2 wheeled) Transfers: Sit to/from Stand Sit to Stand: Mod assist         General transfer comment: increased time and effort, therapist stabilizing RW as pt preferring to pull (as she would at home with her rollator) Heavy Mod assist to power up  Ambulation/Gait Ambulation/Gait assistance: Min assist Ambulation Distance (Feet): 8 Feet Assistive device: Rolling walker (2 wheeled) Gait Pattern/deviations: Decreased step length - right;Decreased step length - left;Trunk flexed     General Gait Details: Cues to self-monitor for activity tolerance; dependent on UE support from [employer]             Wheelchair Mobility    Modified Rankin (Stroke Patients Only)        Balance     Sitting balance-Leahy Scale: Fair     Standing balance support: During functional activity;Bilateral upper extremity supported Standing balance-Leahy Scale: Poor                              Cognition Arousal/Alertness: Awake/alert Behavior During Therapy: WFL for tasks assessed/performed Overall Cognitive Status: Within Functional Limits for tasks assessed                                        Exercises      General Comments        Pertinent Vitals/Pain Pain Assessment: Faces Faces Pain Scale: Hurts little more Pain Location: bilateral knees and back Pain Descriptors / Indicators: Sore Pain Intervention(s): Monitored during session    Home Living                      Prior Function            PT Goals (current goals can now be found in the care plan section) Acute Rehab PT Goals Patient Stated Goal: return home PT Goal Formulation: With patient Time For Goal Achievement: 03/11/18 Potential to Achieve Goals: Good Progress towards PT goals: Progressing toward goals    Frequency    Min 3X/week      PT Plan Current plan remains appropriate;Other (comment)(Per SW,  family concerned about pt's ability to manage at hom)    Co-evaluation              AM-PAC PT "6 Clicks" Daily Activity  Outcome Measure  Difficulty turning over in bed (including adjusting bedclothes, sheets and blankets)?: A Lot Difficulty moving from lying on back to sitting on the side of the bed? : A Lot Difficulty sitting down on and standing up from a chair with arms (e.g., wheelchair, bedside commode, etc,.)?: Unable Help needed moving to and from a bed to chair (including a wheelchair)?: A Little Help needed walking in hospital room?: A Little Help needed climbing 3-5 steps with a railing? : Total 6 Click Score: 12    End of Session Equipment Utilized During Treatment: Gait belt Activity Tolerance: Patient limited by  fatigue Patient left: in chair;with call bell/phone within reach;Other (comment)(preparing to eat lunch) Nurse Communication: Mobility status PT Visit Diagnosis: Other abnormalities of gait and mobility (R26.89)     Time: [id number] PT Time Calculation (min) (ACUTE ONLY): 15 min  Charges:  $Gait Training: 8-22 mins                    G Codes:       Van ClinesHolly Charisse Wendell, PT  Acute Rehabilitation Services Pager [phone] Office [phone]    Levi AlandHolly H Giovanna Kemmerer 02/28/2018, 2:38 PM

## 2018-02-28 NOTE — Progress Notes (Signed)
Updated PT notes sent to [employer] and [place] [place] initiated this am  Burna SisJenna H. Alver Leete, LCSW Clinical Social Worker [phone]

## 2018-03-01 ENCOUNTER — Inpatient Hospital Stay (HOSPITAL_COMMUNITY): Payer: Medicare HMO

## 2018-03-01 LAB — CBC WITH DIFFERENTIAL/PLATELET
Basophils Absolute: 0 K/uL (ref 0.0–0.1)
Basophils Relative: 0 %
Eosinophils Absolute: 0 K/uL (ref 0.0–0.7)
Eosinophils Relative: 0 %
HCT: 49.8 % — ABNORMAL HIGH (ref 36.0–46.0)
Hemoglobin: 15.9 g/dL — ABNORMAL HIGH (ref 12.0–15.0)
Lymphocytes Relative: 8 %
Lymphs Abs: 1 K/uL (ref 0.7–4.0)
MCH: 29.9 pg (ref 26.0–34.0)
MCHC: 31.9 g/dL (ref 30.0–36.0)
MCV: 93.8 fL (ref 78.0–100.0)
Monocytes Absolute: 1.1 K/uL — ABNORMAL HIGH (ref 0.1–1.0)
Monocytes Relative: 9 %
Neutro Abs: 9.7 K/uL — ABNORMAL HIGH (ref 1.7–7.7)
Neutrophils Relative %: 83 %
Platelets: 215 K/uL (ref 150–400)
RBC: 5.31 MIL/uL — ABNORMAL HIGH (ref 3.87–5.11)
RDW: 13.7 % (ref 11.5–15.5)
WBC: 11.7 K/uL — ABNORMAL HIGH (ref 4.0–10.5)

## 2018-03-01 LAB — GLUCOSE, CAPILLARY
GLUCOSE-CAPILLARY: 189 mg/dL — AB (ref 65–99)
GLUCOSE-CAPILLARY: 192 mg/dL — AB (ref 65–99)
Glucose-Capillary: 134 mg/dL — ABNORMAL HIGH (ref 65–99)
Glucose-Capillary: 198 mg/dL — ABNORMAL HIGH (ref 65–99)

## 2018-03-01 LAB — COMPREHENSIVE METABOLIC PANEL
ALT: 21 U/L (ref 14–54)
ANION GAP: 12 (ref 5–15)
AST: 16 U/L (ref 15–41)
Albumin: 3.4 g/dL — ABNORMAL LOW (ref 3.5–5.0)
Alkaline Phosphatase: 56 U/L (ref 38–126)
BILIRUBIN TOTAL: 0.8 mg/dL (ref 0.3–1.2)
BUN: 23 mg/dL — AB (ref 6–20)
CO2: 36 mmol/L — ABNORMAL HIGH (ref 22–32)
Calcium: 8.8 mg/dL — ABNORMAL LOW (ref 8.9–10.3)
Chloride: 90 mmol/L — ABNORMAL LOW (ref 101–111)
Creatinine, Ser: 0.74 mg/dL (ref 0.44–1.00)
GFR calc Af Amer: >60 mL/min (ref >60)
GFR calc non Af Amer: >60 mL/min (ref >60)
Glucose, Bld: 118 mg/dL — ABNORMAL HIGH (ref 65–99)
POTASSIUM: 3.5 mmol/L (ref 3.5–5.1)
Sodium: 138 mmol/L (ref 135–145)
TOTAL PROTEIN: 6.9 g/dL (ref 6.5–8.1)

## 2018-03-01 LAB — PHOSPHORUS: Phosphorus: 4.9 mg/dL — ABNORMAL HIGH (ref 2.5–4.6)

## 2018-03-01 LAB — MAGNESIUM: Magnesium: 2.6 mg/dL — ABNORMAL HIGH (ref 1.7–2.4)

## 2018-03-01 MED ORDER — FUROSEMIDE 40 MG PO TABS
40.0000 mg | ORAL_TABLET | Freq: Two times a day (BID) | ORAL | Status: DC
  Administered 2018-03-01 – 2018-03-02 (×2): 40 mg via ORAL
  Filled 2018-03-01 (×2): qty 1

## 2018-03-01 MED ORDER — PREDNISONE 20 MG PO TABS
60.0000 mg | ORAL_TABLET | Freq: Every day | ORAL | Status: DC
  Administered 2018-03-02: 60 mg via ORAL
  Filled 2018-03-01: qty 3

## 2018-03-01 NOTE — Progress Notes (Signed)
Patient placed on CPAP, tolerating well at this time, no distress noted RCP will continue to monitor.

## 2018-03-01 NOTE — Progress Notes (Signed)
Physical Therapy Treatment Patient Details Name: Karen Perez MRN: [id number] DOB: January 27, 1951 Today's Date: 03/01/2018    History of Present Illness Pt is a 67 y/o female admitted secondary to hypoxia from CAP/COPD exacerbation/CHF. PMH including but not limited to COPD, HTN, DM and hx of CVA.    PT Comments    Continuing work on functional mobility and activity tolerance;  Good progress towards goals -- able to walk in hallway today; Discussed with RN re: no need for PureWick catheter; Overall progressing well; Anticipate continuing good progress at post-acute rehabilitation.    Follow Up Recommendations  SNF(for post-acute rehab)     Equipment Recommendations  None recommended by PT    Recommendations for Other Services       Precautions / Restrictions Precautions Precautions: Fall    Mobility  Bed Mobility Overal bed mobility: Needs Assistance Bed Mobility: Supine to Sit     Supine to sit: Min assist     General bed mobility comments: Min hand held assist to pull to sit; increased time and effort, HOB elevated; inefficient scooting  Transfers Overall transfer level: Needs assistance Equipment used: Rolling walker (2 wheeled) Transfers: Sit to/from Stand Sit to Stand: Mod assist         General transfer comment: Mod assist to power up and to stabilize RW as pt tends to pull up on RW  Ambulation/Gait Ambulation/Gait assistance: Min assist;Min guard Ambulation Distance (Feet): (Hallway ambulation) Assistive device: Rolling walker (2 wheeled) Gait Pattern/deviations: Decreased step length - right;Decreased step length - left;Trunk flexed     General Gait Details: Cues to self-monitor for activity tolerance; dependent on UE support from RW; tending to keep shoulders quite tense; arthritis pain in knees increased with more time walking   Stairs             Wheelchair Mobility    Modified Rankin (Stroke Patients Only)       Balance     Sitting  balance-Leahy Scale: Fair       Standing balance-Leahy Scale: Poor                              Cognition Arousal/Alertness: Awake/alert Behavior During Therapy: WFL for tasks assessed/performed Overall Cognitive Status: Within Functional Limits for tasks assessed                                        Exercises      General Comments        Pertinent Vitals/Pain Pain Assessment: 0-10 Pain Score: 5  Pain Location: bilateral knees and back Pain Descriptors / Indicators: Sore Pain Intervention(s): Monitored during session(Notified RN)    Home Living                      Prior Function            PT Goals (current goals can now be found in the care plan section) Acute Rehab PT Goals Patient Stated Goal: return home, though tells me she is OK with SNF for rehab PT Goal Formulation: With patient Time For Goal Achievement: 03/11/18 Potential to Achieve Goals: Good Progress towards PT goals: Progressing toward goals(upgraded amb goal)    Frequency    Min 3X/week      PT Plan Current plan remains appropriate    Co-evaluation  AM-PAC PT "6 Clicks" Daily Activity  Outcome Measure  Difficulty turning over in bed (including adjusting bedclothes, sheets and blankets)?: A Little Difficulty moving from lying on back to sitting on the side of the bed? : A Lot Difficulty sitting down on and standing up from a chair with arms (e.g., wheelchair, bedside commode, etc,.)?: Unable Help needed moving to and from a bed to chair (including a wheelchair)?: A Little Help needed walking in hospital room?: A Little Help needed climbing 3-5 steps with a railing? : A Lot 6 Click Score: 14    End of Session Equipment Utilized During Treatment: Gait belt Activity Tolerance: Patient tolerated treatment well Patient left: in chair;with call bell/phone within reach(preparing to eat lunch) Nurse Communication: Mobility status PT  Visit Diagnosis: Other abnormalities of gait and mobility (R26.89)     Time: [id number] PT Time Calculation (min) (ACUTE ONLY): 18 min  Charges:  $Gait Training: 8-22 mins                    G Codes:       Van ClinesHolly Ovie Eastep, PT  Acute Rehabilitation Services Pager [phone] Office [phone]    Levi AlandHolly H Beverley Allender 03/01/2018, 1:25 PM

## 2018-03-01 NOTE — Progress Notes (Addendum)
PROGRESS NOTE    Karen PolingLinda J Perez  WUJ:[id number] DOB: 07/02/1951 DOA: 02/22/2018 PCP: Lyndon CodeKhan, Fozia M, MD   Brief Narrative:  Karen Perez is a 67 y.o. female with history of COPD with ongoing tobacco abuse, cerebral aneurysm status post clipping and coiling, hypertension, diabetes mellitus type 2 was referred to the ER after patient was found to be hypoxic at primary care's office.  Patient had gone for a routine visit when patient was found to be hypoxic.  As per the patient's daughter patient has been increasingly short of breath last few months and gets short of breath with minimal exertion.  Has not had any chest pain fever chills.  Has been having some cough off and on.  In the ER patient is found to be hypoxic and was mildly confused. Chest x-ray shows congestion and cardiomegaly.  EKG shows normal sinus rhythm.  On exam patient has bilateral expiratory wheeze.  Patient was given Solu-Medrol, nebulizer treatments for COPD, and Lasix for possible CHF. A CTA was done to r/o PE and showed no PE but did show bilateral PNA. She was started on CAP coverage. Continued to wear BiPAP as [facility] but is on 5 Liters of O2 via [place] today. She is continued to be diuresed and treated for PNA, COPD, OHS/OSA and CHF and is improving. Ambulatory O2 Screen done and showed patient desaturated and needs Home O2.  Patient's daughter felt like she wont able to take care of her mother currently, but Daughter has asked for Short term SNF placement when PT recommended home health.  PT reevaluated and felt SNF would be more appropriate for this patient.  Social worker discussed facility placement working on authorization awaiting to hear back.  (ADDENDUM 1607): Authorization received but unfortunately facility needs CPAP for patient. Will not have it by tomorrow so patient will D/C tomorrow    Assessment & Plan:   Principal Problem:   Acute respiratory failure with hypoxia  (HCC) Active Problems:   OBESITY, MORBID   TOBACCO USER   OBSTRUCTIVE SLEEP APNEA   Essential hypertension   CEREBRAL ANEURYSM   COPD with acute exacerbation (HCC)   Diabetes mellitus type 2 in obese (HCC)   Acute respiratory failure with hypoxia and hypercapnia (HCC)  Acute Respiratory Failure with Hypoxia and Hypercarbia requiring NIPPV with BiPAP likely from multifactorial causes (CAP, Acute Exacerbation of of COPD, Acute Decompensation of Diastolic CHF, OSH/OSA), improving  -Placed in SDU  -Patient symptoms likely from a combination of CAP, COPD/Sleep apnea and CHF; PE ruled out.   -C/w BiPAP as tolerated and wean to Supplemental O2 as tolerlared. *C/w CPAP mode on the V60 BIPAP  -D Dimer slightly high at 0.68 ordered CTA of Chest to r/o PE given Hypoxia and Increased WOB; CTA showed no PE but did show Left > Right LL Consolidations, Mild-to-moderate Cardiomegaly, Aortic Atherosclerosis, Mild Upper Lobe Emphysema. And an 18 mm Right Lobe Thyroid Nodule -Continue with nebulizer treatments with DuoNeb 3 mL q4h and q2hprn wheezing along with Budesonide 0.25 mg Neb BID; Held Home Spiriva and Advair (given that she is on Budesonide and will resume Home meds at D/C) -Added Abx with po Doxycycline for COPD 100 mg po BID x 5 days but changed to IV Ceftriaxone and IV Azithromycin for CAP coverage given CAP; Day 5 of Abx for CAP -Changed IV Solumedrol 40 mg IV BID back to 60 mg IV BID yesterday given her present wheezing and will  changed p.o. this a.m. and will need taper at discharge when she goes to sniff; Given 125 mg Solumedrol in ED - Added Protonix 40 mg for GI Prophylaxis given that patient is on IV Steroids -IV Furosemide 80 mg BID is continued and transition to 40 mg p.o. twice daily -BNP was 45.3 but maybe artificially low in obesity -Checked Transthoracic Echocardiogram and showed EF of 65-70% and G1DD -Strict I's/O's, Daily Weights, SLIV; Patient is 10,440 mL so far and Weight is down 7  lbs -Cycled Cardiac Troponins and Negative at <0.03 x3 -Continue to Monitor Respiratory Status Closely and continue BiPAP/CPAP qHS and PRN -Checked Respiratory Virus Panel and Influenza A/B PCR and was Negative; Droplet Precautions Discontinued -Maintain O2 Saturations >92% and Continue to Wean O2 requirements  -Add Flutter Valve and Incentive Spirometry  -Chest x-ray this AM showed no significant interval change. There is bibasilar airspace disease likely secondary to hypoventilation as well as pulmonary vascular congestion unchanged as well as cardiac enlargement. -PT/OT Evaluate and Treat and recommending Home Health PT;Supervision/Assistance  -Ambulatory Pulse Oximetry Screening done 2 days ago and patient desturated to 80's on Room Air; Will need to arrange Home O2 at D/C but for now will repeat O2 Ambulatory Pulse Oximetry Screening this AM -Continue to Ambulate in the Halls  Essential Hypertension  -BP this AM was 135/87 -C/w Home Lisinopril 2.5 mg po Daily -C/w Home Amlodipine 10 mg po Daily (per report patient not taking) -C/w IV Hydralazine 10 mg IV q4hprn for SBP >140   Diabetes Mellitus Type 2 -Hold home Kombiglyze XR 5-500 mg po Daily  -Patient has been placed on Sensitive Novolog SSI AC -Checked HbA1c and was 5.6 -Closely follow CBGs since patient is on steroids; CBG's ranging from 134-215  History of Sleep Apnea -See Above -Placed on CPAP Mode again last night without issues. -Patient is noncompliant per Daughters report to admitting physician   Tobacco Abuse -Tobacco cessation counseling requested. -Discussed with patient about Nicotine Patch and started 14 mg TD  History of Cerebral Aneurysm status post clipping and coiling with history of stroke  -C/w Atorvastatin 20 mg po Daily and Clopidogrel 75 mg po Daily  Morbid Obesity. -Patient's BMI was 43.89 kg/m2 on admission and now 42.51 from Diuresis  -Weight Loss Counseling Given   Thyroid Nodule -Noted on CTA  of Chest -18 mm on Right Side -Checked TSH and was 0.415 and FT4 was 0.88 -Will need full Outpatient Workup   Hyponatremia/Hypochloremia -Mild. Na+ was 133 and Chloride was 89; Na+ now 138 and Chloride 90 -Likely from Diuresis -Continue to Monitor and Repeat CMP in AM  Hyperphosphatemia/Hypermagnesemia -Patient's Phos was 4.7 and Mag was 2.7 yesterday and today was 4.9 and Mag was 2.6  -Continue to Monitor and Repeat in AM  Erythrocytosis -Slight. Likely 2/2 to Tobacco Abuse -Hb/Hct was 15.6/47.9 and went to 16.0/50.2 but now is 15.9/49.8 -Could also have increased from Diuresis  -Repeat CBC in AM   Leukocytosis -Mild at 11.7 and up from 9.0 (on 4/14) -Likely IV Steroid Demargination and IV steroids have been transitioned to p.o. Prednisone -Continue to Monitor for S/Sx of Infection -Repeat CBC in AM  DVT prophylaxis: Enoxaparin 60 mg sq q24h Code Status: FULL CODE Family Communication: No family present at bedside Disposition Plan: Skilled nursing facility at discharge whenever uncharged authorization is approved  Consultants:   None   Procedures:  ECHOCARDIOGRAM ------------------------------------------------------------------- Study Conclusions  - Left ventricle: The cavity size was normal. There was severe  concentric hypertrophy. Systolic function was vigorous. The   estimated ejection fraction was in the range of 65% to 70%. Wall   motion was normal; there were no regional wall motion   abnormalities. Doppler parameters are consistent with abnormal   left ventricular relaxation (grade 1 diastolic dysfunction).   There was no evidence of elevated ventricular filling pressure by   Doppler parameters. - Aortic valve: There was no regurgitation. - Aortic root: The aortic root was normal in size. - Mitral valve: There was no regurgitation. - Left atrium: The atrium was mildly dilated. - Right ventricle: Systolic function was normal. - Right atrium: The atrium  was normal in size. - Tricuspid valve: There was mild regurgitation. - Pulmonic valve: There was no regurgitation. - Pulmonary arteries: Systolic pressure was within the normal   range. - Inferior vena cava: The vessel was normal in size. - Pericardium, extracardiac: There was no pericardial effusion.   Antimicrobials:  Anti-infectives (From admission, onward)   Start     Dose/Rate Route Frequency Ordered Stop   02/24/18 0913  azithromycin (ZITHROMAX) tablet 500 mg     500 mg Oral Daily 02/24/18 0913 02/27/18 1528   02/23/18 1500  cefTRIAXone (ROCEPHIN) 1 g in sodium chloride 0.9 % 100 mL IVPB     1 g 200 mL/hr over 30 Minutes Intravenous Every 24 hours 02/23/18 1420     02/23/18 1500  azithromycin (ZITHROMAX) 500 mg in sodium chloride 0.9 % 250 mL IVPB  Status:  Discontinued     500 mg 250 mL/hr over 60 Minutes Intravenous Every 24 hours 02/23/18 1420 02/24/18 0913   02/23/18 1000  doxycycline (VIBRA-TABS) tablet 100 mg  Status:  Discontinued     100 mg Oral Every 12 hours 02/23/18 0959 02/23/18 1420     Subjective: Seen and examined at bedside and was doing well.  Had no active complaints and denied any chest pain, shortness breath, nausea, vomiting.  Slept a CPAP last night and tolerated it well.  Had some allergies this morning.  No other concerns or complaints at this time.  Objective: Vitals:   02/28/18 2312 03/01/18 0033 03/01/18 0107 03/01/18 0733  BP:  135/85  135/87  Pulse: 91 78  72  Resp: 18   20  Temp:  98.4 F (36.9 C)  97.8 F (36.6 C)  TempSrc:  Axillary  Oral  SpO2: 93% 97%  100%  Weight:   123.1 kg (271 lb 6.2 oz)   Height:        Intake/Output Summary (Last 24 hours) at 03/01/2018 0742 Last data filed at 03/01/2018 1610 Gross per 24 hour  Intake 240 ml  Output 1350 ml  Net -1110 ml   Filed Weights   02/27/18 0447 02/28/18 0452 03/01/18 0107  Weight: 123.5 kg (272 lb 4.3 oz) 123.2 kg (271 lb 9.7 oz) 123.1 kg (271 lb 6.2 oz)    Examination: Physical Exam:  Constitutional: Well-nourished, well-developed morbidly obese African-American female who is currently in no acute distress.  She is sitting up eating breakfast Eyes: Sclera anicteric.  Lids and conjunctive are normal. ENMT: External ears and nose appear normal.  Mucous membranes moist.  Grossly normal hearing Neck: Supple with no appreciable JVD, or JVD is difficult to assess in this patient because of her body habitus Respiratory: Diminished bibasilar breath sounds with very slight bilateral expiratory wheezing noted patient has unlabored breathing and has no appreciable rales, rhonchi or crackles. Not wearing any supplemental O2 via nasal cannula Cardiovascular:  Regular rate and rhythm.  Very minimal lower extremity edema bilaterally Abdomen: Soft, nontender, distended secondary to body habitus.  Bowel sounds present all 4 quadrants GU: Deferred Musculoskeletal: No contractures, no cyanosis. Skin: Warm and dry.  No appreciable rashes or lesions on a limited skin evaluation Neurologic:.  Cranial nerves II through XII grossly intact.  No appreciable focal deficits Psychiatric: Normal pleasant mood and affect.  Intact judgment and insight.  Patient awake, alert and oriented x3.  Data Reviewed: I have personally reviewed following labs and imaging studies  CBC: Recent Labs  Lab 02/25/18 0327 02/26/18 0245 02/27/18 0149 02/28/18 0243 03/01/18 0556  WBC 8.0 9.1 9.0 11.1* 11.7*  NEUTROABS 6.7 7.8* 7.5 9.2* 9.7*  HGB 14.6 14.9 15.6* 16.0* 15.9*  HCT 47.9* 47.2* 47.9* 50.2* 49.8*  MCV 97.8 95.2 93.7 95.1 93.8  PLT 210 215 224 216 215   Basic Metabolic Panel: Recent Labs  Lab 02/25/18 0327 02/26/18 0245 02/27/18 0149 02/28/18 0243 03/01/18 0556  NA 139 133* 135 135 138  K 3.5 3.9 3.5 3.5 3.5  CL 91* 89* 88* 88* 90*  CO2 37* 32 37* 38* 36*  GLUCOSE 149* 139* 140* 138* 118*  BUN 17 23* 26* 24* 23*  CREATININE 0.74 0.82 0.81 0.86 0.74  CALCIUM  8.7* 8.5* 8.5* 8.8* 8.8*  MG 2.4 2.3 2.7* 2.7* 2.6*  PHOS 3.9 3.5 4.7* 5.3* 4.9*   GFR: Estimated Creatinine Clearance: 91.6 mL/min (by C-G formula based on SCr of 0.74 mg/dL). Liver Function Tests: Recent Labs  Lab 02/25/18 0327 02/26/18 0245 02/27/18 0149 02/28/18 0243 03/01/18 0556  AST 14* 14* 15 14* 16  ALT 13* 11* 15 19 21   ALKPHOS 53 58 56 57 56  BILITOT 0.5 0.6 0.6 0.8 0.8  PROT 7.2 6.9 7.1 7.1 6.9  ALBUMIN 3.6 3.6 3.6 3.6 3.4*   No results for input(s): LIPASE, AMYLASE in the last 168 hours. No results for input(s): AMMONIA in the last 168 hours. Coagulation Profile: No results for input(s): INR, PROTIME in the last 168 hours. Cardiac Enzymes: Recent Labs  Lab 02/23/18 0125 02/23/18 0545 02/23/18 1137  TROPONINI <0.03 <0.03 <0.03   BNP (last 3 results) No results for input(s): PROBNP in the last 8760 hours. HbA1C: No results for input(s): HGBA1C in the last 72 hours. CBG: Recent Labs  Lab 02/28/18 0726 02/28/18 1124 02/28/18 1606 02/28/18 2012 03/01/18 0738  GLUCAP 106* 196* 167* 215* 134*   Lipid Profile: No results for input(s): CHOL, HDL, LDLCALC, TRIG, CHOLHDL, LDLDIRECT in the last 72 hours. Thyroid Function Tests: No results for input(s): TSH, T4TOTAL, FREET4, T3FREE, THYROIDAB in the last 72 hours. Anemia Panel: No results for input(s): VITAMINB12, FOLATE, FERRITIN, TIBC, IRON, RETICCTPCT in the last 72 hours. Sepsis Labs: No results for input(s): PROCALCITON, LATICACIDVEN in the last 168 hours.  Recent Results (from the past 240 hour(s))  MRSA PCR Screening     Status: None   Collection Time: 02/23/18  3:16 PM  Result Value Ref Range Status   MRSA by PCR NEGATIVE NEGATIVE Final    Comment:        The GeneXpert MRSA Assay (FDA approved for NASAL specimens only), is one component of a comprehensive MRSA colonization surveillance program. It is not intended to diagnose MRSA infection nor to guide or monitor treatment for MRSA  infections. Performed at [facility], [address], [place], [place] [address]   Respiratory Panel by PCR     Status: None  Collection Time: 02/24/18 12:09 PM  Result Value Ref Range Status   Adenovirus NOT DETECTED NOT DETECTED Final   Coronavirus 229E NOT DETECTED NOT DETECTED Final   Coronavirus HKU1 NOT DETECTED NOT DETECTED Final   Coronavirus NL63 NOT DETECTED NOT DETECTED Final   Coronavirus OC43 NOT DETECTED NOT DETECTED Final   Metapneumovirus NOT DETECTED NOT DETECTED Final   Rhinovirus / Enterovirus NOT DETECTED NOT DETECTED Final   Influenza A NOT DETECTED NOT DETECTED Final   Influenza B NOT DETECTED NOT DETECTED Final   Parainfluenza Virus 1 NOT DETECTED NOT DETECTED Final   Parainfluenza Virus 2 NOT DETECTED NOT DETECTED Final   Parainfluenza Virus 3 NOT DETECTED NOT DETECTED Final   Parainfluenza Virus 4 NOT DETECTED NOT DETECTED Final   Respiratory Syncytial Virus NOT DETECTED NOT DETECTED Final   Bordetella pertussis NOT DETECTED NOT DETECTED Final   Chlamydophila pneumoniae NOT DETECTED NOT DETECTED Final   Mycoplasma pneumoniae NOT DETECTED NOT DETECTED Final    Radiology Studies: Dg Chest Port 1 View  Result Date: 02/28/2018 CLINICAL DATA:  Shortness of breath. EXAM: PORTABLE CHEST 1 VIEW COMPARISON:  One-view chest x-ray 02/26/2018 FINDINGS: The heart size is exaggerated by low lung volumes. Mild pulmonary vascular congestion is present without frank edema. There are no effusions. Bibasilar airspace disease likely reflects atelectasis. IMPRESSION: 1. Continued improvement in aeration at both lung bases with some residual atelectasis, left greater than right. Electronically Signed   By: Marin Roberts M.D.   On: 02/28/2018 07:16   Scheduled Meds: . amLODipine  10 mg Oral Daily  . atorvastatin  20 mg Oral Daily  . budesonide (PULMICORT) nebulizer solution  0.25 mg Nebulization BID  . clopidogrel  75 mg Oral Daily  . enoxaparin (LOVENOX) injection   60 mg Subcutaneous Q24H  . furosemide  80 mg Intravenous Q12H  . guaiFENesin  1,200 mg Oral BID  . insulin aspart  0-9 Units Subcutaneous TID WC  . ipratropium-albuterol  3 mL Nebulization BID  . lisinopril  2.5 mg Oral Daily  . mouth rinse  15 mL Mouth Rinse BID  . methylPREDNISolone (SOLU-MEDROL) injection  60 mg Intravenous Q12H  . nicotine  14 mg Transdermal Daily  . pantoprazole  40 mg Oral Daily   Continuous Infusions: . cefTRIAXone (ROCEPHIN)  IV Stopped (02/28/18 1608)    LOS: 7 days   Merlene Laughter, DO [facility] Pager [phone]  If 7PM-7AM, please contact night-coverage [email or web address] Password [email or web address] 03/01/2018, 7:42 AM

## 2018-03-02 ENCOUNTER — Inpatient Hospital Stay (HOSPITAL_COMMUNITY): Payer: Medicare HMO

## 2018-03-02 DIAGNOSIS — R2681 Unsteadiness on feet: Secondary | ICD-10-CM | POA: Diagnosis not present

## 2018-03-02 DIAGNOSIS — M25569 Pain in unspecified knee: Secondary | ICD-10-CM | POA: Diagnosis not present

## 2018-03-02 DIAGNOSIS — J80 Acute respiratory distress syndrome: Secondary | ICD-10-CM | POA: Diagnosis not present

## 2018-03-02 DIAGNOSIS — G4733 Obstructive sleep apnea (adult) (pediatric): Secondary | ICD-10-CM | POA: Diagnosis not present

## 2018-03-02 DIAGNOSIS — R0902 Hypoxemia: Secondary | ICD-10-CM | POA: Diagnosis not present

## 2018-03-02 DIAGNOSIS — E876 Hypokalemia: Secondary | ICD-10-CM | POA: Diagnosis not present

## 2018-03-02 DIAGNOSIS — M2550 Pain in unspecified joint: Secondary | ICD-10-CM | POA: Diagnosis not present

## 2018-03-02 DIAGNOSIS — Z8673 Personal history of transient ischemic attack (TIA), and cerebral infarction without residual deficits: Secondary | ICD-10-CM | POA: Diagnosis not present

## 2018-03-02 DIAGNOSIS — J441 Chronic obstructive pulmonary disease with (acute) exacerbation: Secondary | ICD-10-CM | POA: Diagnosis not present

## 2018-03-02 DIAGNOSIS — J189 Pneumonia, unspecified organism: Secondary | ICD-10-CM | POA: Diagnosis not present

## 2018-03-02 DIAGNOSIS — E785 Hyperlipidemia, unspecified: Secondary | ICD-10-CM | POA: Diagnosis not present

## 2018-03-02 DIAGNOSIS — J9601 Acute respiratory failure with hypoxia: Secondary | ICD-10-CM

## 2018-03-02 DIAGNOSIS — Z5189 Encounter for other specified aftercare: Secondary | ICD-10-CM | POA: Diagnosis not present

## 2018-03-02 DIAGNOSIS — E1165 Type 2 diabetes mellitus with hyperglycemia: Secondary | ICD-10-CM | POA: Diagnosis not present

## 2018-03-02 DIAGNOSIS — I10 Essential (primary) hypertension: Secondary | ICD-10-CM | POA: Diagnosis not present

## 2018-03-02 DIAGNOSIS — E119 Type 2 diabetes mellitus without complications: Secondary | ICD-10-CM | POA: Diagnosis not present

## 2018-03-02 DIAGNOSIS — I6350 Cerebral infarction due to unspecified occlusion or stenosis of unspecified cerebral artery: Secondary | ICD-10-CM | POA: Diagnosis not present

## 2018-03-02 DIAGNOSIS — R0602 Shortness of breath: Secondary | ICD-10-CM | POA: Diagnosis not present

## 2018-03-02 DIAGNOSIS — K219 Gastro-esophageal reflux disease without esophagitis: Secondary | ICD-10-CM | POA: Diagnosis not present

## 2018-03-02 DIAGNOSIS — M129 Arthropathy, unspecified: Secondary | ICD-10-CM | POA: Diagnosis not present

## 2018-03-02 DIAGNOSIS — M25562 Pain in left knee: Secondary | ICD-10-CM | POA: Diagnosis not present

## 2018-03-02 DIAGNOSIS — I509 Heart failure, unspecified: Secondary | ICD-10-CM | POA: Diagnosis not present

## 2018-03-02 DIAGNOSIS — J449 Chronic obstructive pulmonary disease, unspecified: Secondary | ICD-10-CM | POA: Diagnosis not present

## 2018-03-02 LAB — CBC WITH DIFFERENTIAL/PLATELET
BASOS ABS: 0 10*3/uL (ref 0.0–0.1)
Basophils Relative: 0 %
EOS ABS: 0 10*3/uL (ref 0.0–0.7)
Eosinophils Relative: 0 %
HCT: 48.2 % — ABNORMAL HIGH (ref 36.0–46.0)
Hemoglobin: 15.5 g/dL — ABNORMAL HIGH (ref 12.0–15.0)
LYMPHS PCT: 13 %
Lymphs Abs: 1.7 10*3/uL (ref 0.7–4.0)
MCH: 29.9 pg (ref 26.0–34.0)
MCHC: 32.2 g/dL (ref 30.0–36.0)
MCV: 93.1 fL (ref 78.0–100.0)
Monocytes Absolute: 1.3 10*3/uL — ABNORMAL HIGH (ref 0.1–1.0)
Monocytes Relative: 10 %
Neutro Abs: 10.3 10*3/uL — ABNORMAL HIGH (ref 1.7–7.7)
Neutrophils Relative %: 77 %
PLATELETS: 226 10*3/uL (ref 150–400)
RBC: 5.18 MIL/uL — AB (ref 3.87–5.11)
RDW: 13.6 % (ref 11.5–15.5)
WBC: 13.4 10*3/uL — AB (ref 4.0–10.5)

## 2018-03-02 LAB — COMPREHENSIVE METABOLIC PANEL
ALBUMIN: 3.3 g/dL — AB (ref 3.5–5.0)
ALT: 23 U/L (ref 14–54)
AST: 16 U/L (ref 15–41)
Alkaline Phosphatase: 54 U/L (ref 38–126)
Anion gap: 10 (ref 5–15)
BUN: 23 mg/dL — ABNORMAL HIGH (ref 6–20)
CHLORIDE: 88 mmol/L — AB (ref 101–111)
CO2: 36 mmol/L — AB (ref 22–32)
CREATININE: 0.71 mg/dL (ref 0.44–1.00)
Calcium: 8.6 mg/dL — ABNORMAL LOW (ref 8.9–10.3)
GFR calc Af Amer: >60 mL/min (ref >60)
GFR calc non Af Amer: >60 mL/min (ref >60)
Glucose, Bld: 111 mg/dL — ABNORMAL HIGH (ref 65–99)
Potassium: 3.3 mmol/L — ABNORMAL LOW (ref 3.5–5.1)
SODIUM: 134 mmol/L — AB (ref 135–145)
Total Bilirubin: 0.8 mg/dL (ref 0.3–1.2)
Total Protein: 6.5 g/dL (ref 6.5–8.1)

## 2018-03-02 LAB — GLUCOSE, CAPILLARY
GLUCOSE-CAPILLARY: 179 mg/dL — AB (ref 65–99)
Glucose-Capillary: 96 mg/dL (ref 65–99)
Glucose-Capillary: 163 mg/dL — ABNORMAL HIGH (ref 65–99)

## 2018-03-02 LAB — MAGNESIUM: Magnesium: 2.4 mg/dL (ref 1.7–2.4)

## 2018-03-02 LAB — PHOSPHORUS: PHOSPHORUS: 4.1 mg/dL (ref 2.5–4.6)

## 2018-03-02 MED ORDER — PREDNISONE 10 MG PO TABS: ORAL_TABLET | ORAL | 0 refills | Status: AC

## 2018-03-02 MED ORDER — POTASSIUM CHLORIDE ER 10 MEQ PO TBCR: 20.0000 meq | EXTENDED_RELEASE_TABLET | Freq: Every day | ORAL | 0 refills | Status: DC

## 2018-03-02 MED ORDER — PANTOPRAZOLE SODIUM 40 MG PO TBEC: 40.0000 mg | DELAYED_RELEASE_TABLET | Freq: Every day | ORAL | 0 refills | Status: DC

## 2018-03-02 MED ORDER — IPRATROPIUM-ALBUTEROL 0.5-2.5 (3) MG/3ML IN SOLN: 3.0000 mL | RESPIRATORY_TRACT | 0 refills | Status: DC | PRN

## 2018-03-02 MED ORDER — PREDNISONE 10 MG (48) PO TBPK: ORAL_TABLET | ORAL | 0 refills | Status: DC

## 2018-03-02 MED ORDER — FUROSEMIDE 40 MG PO TABS: 40.0000 mg | ORAL_TABLET | Freq: Two times a day (BID) | ORAL | 0 refills | Status: DC

## 2018-03-02 NOTE — Progress Notes (Signed)
Patient will discharge to [facility] Anticipated discharge date: 4/17 Family notified: pt dtr [name] Transportation by Sharin MonsPTAR- scheduled for 2pm Report#: [phone] Rm 809  CSW signing off.  Burna SisJenna H. Myndi Wamble, LCSW Clinical Social Worker [phone]

## 2018-03-02 NOTE — Clinical Social Work Placement (Signed)
   CLINICAL SOCIAL WORK PLACEMENT  NOTE  Date:  03/02/2018  Patient Details  Name: Karen PolingLinda J Sudbeck MRN: [id number] Date of Birth: 12/29/1950  Clinical Social Work is seeking post-discharge placement for this patient at the Skilled  Nursing Facility level of care (*CSW will initial, date and re-position this form in  chart as items are completed):  Yes   Patient/family provided with [facility] Clinical Social Work Department's list of facilities offering this level of care within the geographic area requested by the patient (or if unable, by the patient's family).  Yes   Patient/family informed of their freedom to choose among providers that offer the needed level of care, that participate in Medicare, Medicaid or managed care program needed by the patient, have an available bed and are willing to accept the patient.  Yes   Patient/family informed of 's ownership interest [facility] and [facility], as well as of the fact that they are under no obligation to receive care at these facilities.  PASRR submitted to EDS on 02/25/18     PASRR number received on 02/25/18     Existing PASRR number confirmed on       FL2 transmitted to all facilities in geographic area requested by pt/family on 02/25/18     FL2 transmitted to all facilities within larger geographic area on       Patient informed that his/her managed care company has contracts with or will negotiate with certain facilities, including the following:        Yes   Patient/family informed of bed offers received.  [facility] [place]     Physician recommends and patient chooses bed at      Patient to be transferred to [facility] [place] on 03/02/18.  Patient to be transferred to facility by ptar     Patient family notified on 03/02/18 of transfer.  Name of family member notified:  Tina     PHYSICIAN Please sign FL2     Additional Comment:     _______________________________________________ Burna SisUris, Maeley Matton H, LCSW 03/02/2018, 12:22 PM

## 2018-03-02 NOTE — Discharge Summary (Addendum)
Physician Discharge Summary  Karen Perez UJW:[id number] DOB: 01/20/1951 DOA: 02/22/2018  PCP: Lyndon Code, MD  Admit date: 02/22/2018 Discharge date: 03/02/2018  Admitted From: Disposition:    Recommendations for Outpatient Follow-up:  1. Follow up with PCP in 1-2 weeks 2. Please obtain BMP/CBC in one week your next doctors visit.  3. Taper dose of steroids prescribed 4. Check BMP and magnesium in 5 days  Discharge Condition: Stable CODE STATUS: Full Code  Diet recommendation: Diabetic/2g Na diet.   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  Patient was found to be hypoxic on routine visit but according to the family patient has been increasingly feeling more more short of breath with some exertional dyspnea.  She denied any new complaints.  In the ER patient was found to be hypoxic and slightly confused.  Chest x-ray showed congestion with cardiomegaly.  Initially she was started on Solu-Medrol, nebulizer treatments and was also given a dose of Lasix for possible CHF.  CT of the chest was negative for pulmonary embolism but did show bilateral pneumonia.  She was started on antibiotics for community-acquired pneumonia coverage.  She was placed on BiPAP and later on weaned off to nasal cannula.  Echocardiogram showed ejection fraction 65-70% with grade 1 diastolic dysfunction. Over the course of several day patient's breathing improved.  She completed her antibiotic course and will be discharged on tapered dose of steroids.  She has reached maximum benefit from an hospital stay therefore stable to be discharged with outpatient follow-up recommendations as stated above.  No complaints this morning  Review of systems: HEENT/EYES = negative for pain, redness, loss of vision, double vision, blurred  vision, loss of hearing, sore throat, hoarseness, dysphagia Cardiovascular= negative for chest pain, palpitation, murmurs, lower extremity swelling Respiratory/lungs= negative for shortness of breath, cough, hemoptysis, wheezing, mucus production Gastrointestinal= negative for nausea, vomiting,, abdominal pain, melena, hematemesis Genitourinary= negative for Dysuria, Hematuria, Change in Urinary Frequency MSK = Negative for arthralgia, myalgias, Back Pain, Joint swelling  Neurology= Negative for headache, seizures, numbness, tingling  Psychiatry= Negative for anxiety, depression, suicidal and homocidal ideation Allergy/Immunology= Medication/Food allergy as listed  Skin= Negative for Rash, lesions, ulcers, itching    Discharge Diagnoses:  Principal Problem:   Acute respiratory failure with hypoxia (HCC) Active Problems:   OBESITY, MORBID   TOBACCO USER   OBSTRUCTIVE SLEEP APNEA   Essential hypertension   CEREBRAL ANEURYSM   COPD with acute exacerbation (HCC)   Diabetes mellitus type 2 in obese (HCC)   Acute respiratory failure with hypoxia and hypercapnia (HCC)   Acute respiratory failure with hypoxia and hypercapnia requiring BiPAP, resolved Community-acquired pneumonia, improved Acute mild to moderate exacerbation of COPD, improved - Patient has been taken off of BiPAP.  CT of the chest is negative for pulmonary embolism but showed concerns for community-acquired pneumonia -Patient was started on nebulizer treatments and steroids.  Taper nasal steroid has been prescribed - Completed 5-day course of antibiotics doxycycline then Rocephin and azithromycin while in the hospital -CPAP authorized -patient to be discharged on this today.  Acute on chronic diastolic CHF, grade 1 -At this time patient appears to be well compensated.  Patient has been diuresed with IV Lasix and now transitioned to p.o. - Follow-up outpatient with cardiology in about 4 weeks  Essential  hypertension -Continue home medication  lisinopril and amlodipine  Diabetes mellitus type 2 -Hemoglobin A1c is 5.6.  Resume home medications  Obstructive sleep apnea -CPAP arrangements made  History of cerebral aneurysm status post clipping" due to history of stroke -Continue statin and Plavix  Thyroid nodule -18 mm on the right side, follow-up outpatient for further workup  Discharge Instructions   Allergies as of 03/02/2018      Reactions   Amoxicillin-pot Clavulanate    REACTION: Diarrhea   Neosporin [neomycin-bacitracin Zn-polymyx] Hives      Medication List    TAKE these medications   amLODipine 10 MG tablet Commonly known as:  NORVASC Take 1 tablet (10 mg total) by mouth daily.   atorvastatin 20 MG tablet Commonly known as:  LIPITOR Take 1 tablet (20 mg total) by mouth daily.   clopidogrel 75 MG tablet Commonly known as:  PLAVIX Take 1 tablet (75 mg total) by mouth daily.   diphenhydramine-acetaminophen 25-500 MG Tabs tablet Commonly known as:  TYLENOL PM Take 2 tablets by mouth at bedtime as needed (sleep).   fluticasone 50 MCG/ACT nasal spray Commonly known as:  FLONASE spray one spray in each nostril twice daily as needed for allergies.   Fluticasone-Salmeterol 250-50 MCG/DOSE Aepb Commonly known as:  ADVAIR Inhale 1 puff into the lungs 2 (two) times daily.   furosemide 40 MG tablet Commonly known as:  LASIX Take 1 tablet (40 mg total) by mouth 2 (two) times daily.   ipratropium-albuterol 0.5-2.5 (3) MG/3ML Soln Commonly known as:  DUONEB Take 3 mLs by nebulization every 4 (four) hours as needed.   KOMBIGLYZE XR 5-500 MG Tb24 Generic drug:  Saxagliptin-Metformin TAKE 1 TABLET BY MOUTH EVERY DAY FOR DIABETES   lisinopril 2.5 MG tablet Commonly known as:  PRINIVIL,ZESTRIL Take 2.5 mg by mouth daily.   pantoprazole 40 MG tablet Commonly known as:  PROTONIX Take 1 tablet (40 mg total) by mouth daily. Start taking on:  03/03/2018   potassium  chloride 10 MEQ tablet Commonly known as:  K-DUR Take 2 tablets (20 mEq total) by mouth daily.   predniSONE 10 MG tablet Commonly known as:  DELTASONE Take 1 tablet (10 mg total) by mouth daily. What changed:  Another medication with the same name was added. Make sure you understand how and when to take each.   predniSONE 10 MG tablet Commonly known as:  DELTASONE Take 4 tablets (40 mg total) by mouth daily with breakfast for 2 days, THEN 3 tablets (30 mg total) daily with breakfast for 2 days, THEN 2 tablets (20 mg total) daily with breakfast for 3 days, THEN 1 tablet (10 mg total) daily with breakfast for 3 days. Start taking on:  03/02/2018 What changed:  You were already taking a medication with the same name, and this prescription was added. Make sure you understand how and when to take each.   SPIRIVA RESPIMAT 1.25 MCG/ACT Aers Generic drug:  Tiotropium Bromide Monohydrate USE ONE INHALATION A DAY   traMADol 50 MG tablet Commonly known as:  ULTRAM TAKE 1 TABLET BY MOUTH TWICE A DAY FOR KNEE PAIN AS NEEDED   TYLENOL 8 HOUR ARTHRITIS PAIN 650 MG CR tablet Generic drug:  acetaminophen Take 1,300 mg by mouth every 8 (eight) hours as needed for pain.            Durable Medical Equipment  (From admission, onward)        Start     Ordered   03/01/18 1604  For home use only  DME continuous positive airway pressure (CPAP)  Once    Comments:  ADULT FULL FACEMASK, Size Medium, RR 16  Question Answer Comment  Patient has OSA or probable OSA Yes   Is the patient currently using CPAP in the home No   If yes (to question two) Determine DME provider and inform them of any new orders/settings   Settings 16-20   Signs and symptoms of probable OSA  (select all that apply) Snoring   Signs and symptoms of probable OSA  (select all that apply) Witnessed apneas   CPAP supplies needed Mask, headgear, cushions, filters, heated tubing and water chamber      03/01/18 1604     Contact  information for after-discharge care    Destination    [facility] .   Service:  Skilled Nursing Contact information: [address] [place] [address] [phone]             Allergies  Allergen Reactions  . Amoxicillin-Pot Clavulanate     REACTION: Diarrhea  . Neosporin [Neomycin-Bacitracin Zn-Polymyx] Hives    You were cared for by a hospitalist during your hospital stay. If you have any questions about your discharge medications or the care you received while you were in the hospital after you are discharged, you can call the unit and asked to speak with the hospitalist on call if the hospitalist that took care of you is not available. Once you are discharged, your primary care physician will handle any further medical issues. Please note that no refills for any discharge medications will be authorized once you are discharged, as it is imperative that you return to your primary care physician (or establish a relationship with a primary care physician if you do not have one) for your aftercare needs so that they can reassess your need for medications and monitor your lab values.  Consultations:  None   Procedures/Studies: Dg Chest 2 View  Result Date: 02/22/2018 CLINICAL DATA:  67 year old female with shortness of breath. Lower extremity swelling for 1 week. EXAM: CHEST - 2 VIEW COMPARISON:  09/09/2016 and earlier. FINDINGS: No layering pleural effusion, but there is a small volume of fluid along the right minor fissure which is new. Mild-to-moderate cardiomegaly appears increased since 04-10-16. Other mediastinal contours are within normal limits. Visualized tracheal air column is within normal limits. No pneumothorax or consolidation. Diffuse pulmonary vascular congestion, more pronounced than on the 2016-04-10 comparison. No acute osseous abnormality identified. Negative visible bowel gas pattern. IMPRESSION: 1. Pulmonary interstitial edema with small volume  pleural fluid in the right minor fissure. 2. Mild-to-moderate cardiomegaly may have increased since 2016/04/10. Electronically Signed   By: Odessa Fleming M.D.   On: 02/22/2018 14:45   Ct Angio Chest Pe W Or Wo Contrast  Result Date: 02/23/2018 CLINICAL DATA:  66 y/o F; shortness of breath and back pain with breathing. EXAM: CT ANGIOGRAPHY CHEST WITH CONTRAST TECHNIQUE: Multidetector CT imaging of the chest was performed using the standard protocol during bolus administration of intravenous contrast. Multiplanar CT image reconstructions and MIPs were obtained to evaluate the vascular anatomy. CONTRAST:  ISOVUE-370 IOPAMIDOL (ISOVUE-370) INJECTION 76% COMPARISON:  02/22/2018 chest radiograph FINDINGS: Cardiovascular: Mild-to-moderate cardiomegaly. No pericardial effusion. Normal caliber thoracic aorta with calcific atherosclerosis. Satisfactory opacification of the pulmonary arteries. Mediastinum/Nodes: 18 mm nodule within the right lobe of the thyroid. Normal esophagus. No mediastinal adenopathy. Lungs/Pleura: Mild centrilobular emphysema in the upper lobes. Left-greater-than-right lower lobe consolidations with peribronchial thickening and mucous  plugging. No pleural effusion. Upper Abdomen: No acute abnormality. Musculoskeletal: No chest wall abnormality. No acute or significant osseous findings. Review of the MIP images confirms the above findings. IMPRESSION: 1. No pulmonary embolus identified. 2. Left-greater-than-right lower lobe consolidations, likely pneumonia. 3. Mild-to-moderate cardiomegaly and aortic atherosclerosis. 4. Mild upper lobe emphysema. 5. 18 mm nodule in right lobe of thyroid. Thyroid ultrasound is recommended on a nonemergent basis. Electronically Signed   By: Mitzi Hansen M.D.   On: 02/23/2018 13:43   Dg Chest Port 1 View  Result Date: 03/02/2018 CLINICAL DATA:  Shortness of breath EXAM: PORTABLE CHEST 1 VIEW COMPARISON:  03/01/2018 FINDINGS: Cardiac shadow is mildly enlarged  but stable. The lungs are well aerated bilaterally. Mild bibasilar atelectatic changes are seen. No focal infiltrate or sizable effusion is seen. No bony abnormality is noted. IMPRESSION: Mild bibasilar atelectatic changes. Electronically Signed   By: Alcide Clever M.D.   On: 03/02/2018 08:17   Dg Chest Port 1 View  Result Date: 03/01/2018 CLINICAL DATA:  Short of breath EXAM: PORTABLE CHEST 1 VIEW COMPARISON:  02/28/2018 FINDINGS: Bibasilar airspace disease unchanged. Pulmonary vascular congestion unchanged.  Cardiac enlargement. IMPRESSION: No significant interval change. Bibasilar airspace disease possibly due to hypoventilation. Electronically Signed   By: Marlan Palau M.D.   On: 03/01/2018 07:56   Dg Chest Port 1 View  Result Date: 02/28/2018 CLINICAL DATA:  Shortness of breath. EXAM: PORTABLE CHEST 1 VIEW COMPARISON:  One-view chest x-ray 02/26/2018 FINDINGS: The heart size is exaggerated by low lung volumes. Mild pulmonary vascular congestion is present without frank edema. There are no effusions. Bibasilar airspace disease likely reflects atelectasis. IMPRESSION: 1. Continued improvement in aeration at both lung bases with some residual atelectasis, left greater than right. Electronically Signed   By: Marin Roberts M.D.   On: 02/28/2018 07:16   Dg Chest Port 1 View  Result Date: 02/26/2018 CLINICAL DATA:  Shortness of breath and wheezing. EXAM: PORTABLE CHEST 1 VIEW COMPARISON:  Chest x-ray from yesterday. FINDINGS: Stable cardiomediastinal silhouette. Normal pulmonary vascularity. Persistent low lung volumes with slightly improved aeration the left lung base. No focal consolidation, pleural effusion, or pneumothorax. No acute osseous abnormality. IMPRESSION: Slightly improved aeration in the left lower lobe. Electronically Signed   By: Obie Dredge M.D.   On: 02/26/2018 08:02   Dg Chest Port 1 View  Result Date: 02/25/2018 CLINICAL DATA:  Shortness of Breath EXAM: PORTABLE CHEST 1  VIEW COMPARISON:  02/24/2018 FINDINGS: Cardiac shadow is prominent but stable. The previously seen vascular congestion has nearly completely resolved. Improved aeration is noted in the left lung base. No bony abnormality is seen. IMPRESSION: Decrease in vascular congestion with improved aeration in the left base. Electronically Signed   By: Alcide Clever M.D.   On: 02/25/2018 08:57   Dg Chest Port 1 View  Result Date: 02/24/2018 CLINICAL DATA:  Shortness of breath EXAM: PORTABLE CHEST 1 VIEW COMPARISON:  Portable exam 0532 hours compared to 02/22/2018 chest radiograph and CT chest 02/23/2018 FINDINGS: Enlargement of cardiac silhouette with pulmonary vascular congestion. Mediastinal contours normal. Mild RIGHT atelectasis versus infiltrate. Increased consolidation of LEFT lower lobe since prior exams. No gross pleural effusion or pneumothorax. IMPRESSION: Enlargement of cardiac silhouette with pulmonary vascular congestion. Increased LEFT lower lobe consolidation with persistent mild atelectasis versus consolidation at RIGHT base. Electronically Signed   By: Ulyses Southward M.D.   On: 02/24/2018 09:01     Subjective:   Discharge Exam: Vitals:   03/02/18 0737 03/02/18  1047  BP: 125/73 131/70  Pulse: (!) 59   Resp: 20   Temp: 97.8 F (36.6 C)   SpO2: 98%    Vitals:   03/02/18 0436 03/02/18 0734 03/02/18 0737 03/02/18 1047  BP:   125/73 131/70  Pulse:   (!) 59   Resp:   20   Temp:   97.8 F (36.6 C)   TempSrc:   Oral   SpO2:  98% 98%   Weight: 124.4 kg (274 lb 4 oz)     Height:        General: Pt is alert, awake, not in acute distress Cardiovascular: RRR, S1/S2 +, no rubs, no gallops Respiratory: CTA bilaterally, no wheezing, no rhonchi Abdominal: Soft, NT, ND, bowel sounds + Extremities: no edema, no cyanosis    The results of significant diagnostics from this hospitalization (including imaging, microbiology, ancillary and laboratory) are listed below for reference.      Microbiology: Recent Results (from the past 240 hour(s))  MRSA PCR Screening     Status: None   Collection Time: 02/23/18  3:16 PM  Result Value Ref Range Status   MRSA by PCR NEGATIVE NEGATIVE Final    Comment:        The GeneXpert MRSA Assay (FDA approved for NASAL specimens only), is one component of a comprehensive MRSA colonization surveillance program. It is not intended to diagnose MRSA infection nor to guide or monitor treatment for MRSA infections. Performed at [facility], [address] [place], [place] [address]   Respiratory Panel by PCR     Status: None   Collection Time: 02/24/18 12:09 PM  Result Value Ref Range Status   Adenovirus NOT DETECTED NOT DETECTED Final   Coronavirus 229E NOT DETECTED NOT DETECTED Final   Coronavirus HKU1 NOT DETECTED NOT DETECTED Final   Coronavirus NL63 NOT DETECTED NOT DETECTED Final   Coronavirus OC43 NOT DETECTED NOT DETECTED Final   Metapneumovirus NOT DETECTED NOT DETECTED Final   Rhinovirus / Enterovirus NOT DETECTED NOT DETECTED Final   Influenza A NOT DETECTED NOT DETECTED Final   Influenza B NOT DETECTED NOT DETECTED Final   Parainfluenza Virus 1 NOT DETECTED NOT DETECTED Final   Parainfluenza Virus 2 NOT DETECTED NOT DETECTED Final   Parainfluenza Virus 3 NOT DETECTED NOT DETECTED Final   Parainfluenza Virus 4 NOT DETECTED NOT DETECTED Final   Respiratory Syncytial Virus NOT DETECTED NOT DETECTED Final   Bordetella pertussis NOT DETECTED NOT DETECTED Final   Chlamydophila pneumoniae NOT DETECTED NOT DETECTED Final   Mycoplasma pneumoniae NOT DETECTED NOT DETECTED Final     Labs: BNP (last 3 results) Recent Labs    02/22/18 2119  BNP 45.3   Basic Metabolic Panel: Recent Labs  Lab 02/26/18 0245 02/27/18 0149 02/28/18 0243 03/01/18 0556 03/02/18 0245  NA 133* 135 135 138 134*  K 3.9 3.5 3.5 3.5 3.3*  CL 89* 88* 88* 90* 88*  CO2 32 37* 38* 36* 36*  GLUCOSE 139* 140* 138* 118* 111*  BUN 23* 26*  24* 23* 23*  CREATININE 0.82 0.81 0.86 0.74 0.71  CALCIUM 8.5* 8.5* 8.8* 8.8* 8.6*  MG 2.3 2.7* 2.7* 2.6* 2.4  PHOS 3.5 4.7* 5.3* 4.9* 4.1   Liver Function Tests: Recent Labs  Lab 02/26/18 0245 02/27/18 0149 02/28/18 0243 03/01/18 0556 03/02/18 0245  AST 14* 15 14* 16 16  ALT 11* 15 19 21 23   ALKPHOS 58 56 57 56 54  BILITOT 0.6 0.6 0.8 0.8 0.8  PROT 6.9 7.1 7.1 6.9 6.5  ALBUMIN 3.6 3.6 3.6 3.4* 3.3*   No results for input(s): LIPASE, AMYLASE in the last 168 hours. No results for input(s): AMMONIA in the last 168 hours. CBC: Recent Labs  Lab 02/26/18 0245 02/27/18 0149 02/28/18 0243 03/01/18 0556 03/02/18 0245  WBC 9.1 9.0 11.1* 11.7* 13.4*  NEUTROABS 7.8* 7.5 9.2* 9.7* 10.3*  HGB 14.9 15.6* 16.0* 15.9* 15.5*  HCT 47.2* 47.9* 50.2* 49.8* 48.2*  MCV 95.2 93.7 95.1 93.8 93.1  PLT 215 224 216 215 226   Cardiac Enzymes: No results for input(s): CKTOTAL, CKMB, CKMBINDEX, TROPONINI in the last 168 hours. BNP: Invalid input(s): POCBNP CBG: Recent Labs  Lab 03/01/18 1136 03/01/18 1609 03/01/18 2104 03/02/18 0736 03/02/18 1131  GLUCAP 189* 198* 192* 96 163*   D-Dimer No results for input(s): DDIMER in the last 72 hours. Hgb A1c No results for input(s): HGBA1C in the last 72 hours. Lipid Profile No results for input(s): CHOL, HDL, LDLCALC, TRIG, CHOLHDL, LDLDIRECT in the last 72 hours. Thyroid function studies No results for input(s): TSH, T4TOTAL, T3FREE, THYROIDAB in the last 72 hours.  Invalid input(s): FREET3 Anemia work up No results for input(s): VITAMINB12, FOLATE, FERRITIN, TIBC, IRON, RETICCTPCT in the last 72 hours. Urinalysis    Component Value Date/Time   COLORURINE straw 02/07/2009 0000   APPEARANCEUR Hazy 02/07/2009 0000   LABSPEC 1.020 02/07/2009 0000   PHURINE 5.0 02/07/2009 0000   HGBUR large 02/07/2009 0000   BILIRUBINUR negative 02/07/2009 0000   UROBILINOGEN negative 02/07/2009 0000   NITRITE negative 02/07/2009 0000   Sepsis  Labs Invalid input(s): PROCALCITONIN,  WBC,  LACTICIDVEN Microbiology Recent Results (from the past 240 hour(s))  MRSA PCR Screening     Status: None   Collection Time: 02/23/18  3:16 PM  Result Value Ref Range Status   MRSA by PCR NEGATIVE NEGATIVE Final    Comment:        The GeneXpert MRSA Assay (FDA approved for NASAL specimens only), is one component of a comprehensive MRSA colonization surveillance program. It is not intended to diagnose MRSA infection nor to guide or monitor treatment for MRSA infections. Performed at [facility], [address] [place], [place] [address]   Respiratory Panel by PCR     Status: None   Collection Time: 02/24/18 12:09 PM  Result Value Ref Range Status   Adenovirus NOT DETECTED NOT DETECTED Final   Coronavirus 229E NOT DETECTED NOT DETECTED Final   Coronavirus HKU1 NOT DETECTED NOT DETECTED Final   Coronavirus NL63 NOT DETECTED NOT DETECTED Final   Coronavirus OC43 NOT DETECTED NOT DETECTED Final   Metapneumovirus NOT DETECTED NOT DETECTED Final   Rhinovirus / Enterovirus NOT DETECTED NOT DETECTED Final   Influenza A NOT DETECTED NOT DETECTED Final   Influenza B NOT DETECTED NOT DETECTED Final   Parainfluenza Virus 1 NOT DETECTED NOT DETECTED Final   Parainfluenza Virus 2 NOT DETECTED NOT DETECTED Final   Parainfluenza Virus 3 NOT DETECTED NOT DETECTED Final   Parainfluenza Virus 4 NOT DETECTED NOT DETECTED Final   Respiratory Syncytial Virus NOT DETECTED NOT DETECTED Final   Bordetella pertussis NOT DETECTED NOT DETECTED Final   Chlamydophila pneumoniae NOT DETECTED NOT DETECTED Final   Mycoplasma pneumoniae NOT DETECTED NOT DETECTED Final     Time coordinating discharge: 33 mins  SIGNED:   Dimple NanasAnkit Chirag Dietrick Barris, MD  [facility] 03/02/2018, 1:48 PM Pager   If 7PM-7AM, please contact night-coverage [email or web address] Password [email or web address]

## 2018-03-05 DIAGNOSIS — K219 Gastro-esophageal reflux disease without esophagitis: Secondary | ICD-10-CM | POA: Diagnosis not present

## 2018-03-05 DIAGNOSIS — J189 Pneumonia, unspecified organism: Secondary | ICD-10-CM | POA: Diagnosis not present

## 2018-03-05 DIAGNOSIS — Z8673 Personal history of transient ischemic attack (TIA), and cerebral infarction without residual deficits: Secondary | ICD-10-CM | POA: Diagnosis not present

## 2018-03-05 DIAGNOSIS — I10 Essential (primary) hypertension: Secondary | ICD-10-CM | POA: Diagnosis not present

## 2018-03-05 DIAGNOSIS — M25569 Pain in unspecified knee: Secondary | ICD-10-CM | POA: Diagnosis not present

## 2018-03-05 DIAGNOSIS — E119 Type 2 diabetes mellitus without complications: Secondary | ICD-10-CM | POA: Diagnosis not present

## 2018-03-05 DIAGNOSIS — J449 Chronic obstructive pulmonary disease, unspecified: Secondary | ICD-10-CM | POA: Diagnosis not present

## 2018-03-05 DIAGNOSIS — I509 Heart failure, unspecified: Secondary | ICD-10-CM | POA: Diagnosis not present

## 2018-03-08 ENCOUNTER — Other Ambulatory Visit: Payer: Self-pay

## 2018-03-08 NOTE — Patient Outreach (Signed)
[facility] Care Management  03/08/2018  Jackelyn PolingLinda J Patalano 06/02/1951 [id number]   [employer] [facility];: patient discharged from inpatient admission from [facility] on 03/02/18. However, per chart patient has discharged to a skilled facility: [facility].  RNCM will refer to social work for further review.  Kathyrn SheriffJuana Celsa Nordahl, RN, MSN, [facility],CCM [facility] Community Care Coordinator Cell: [phone]

## 2018-03-11 DIAGNOSIS — I509 Heart failure, unspecified: Secondary | ICD-10-CM | POA: Diagnosis not present

## 2018-03-11 DIAGNOSIS — J189 Pneumonia, unspecified organism: Secondary | ICD-10-CM | POA: Diagnosis not present

## 2018-03-11 DIAGNOSIS — M25562 Pain in left knee: Secondary | ICD-10-CM | POA: Diagnosis not present

## 2018-03-11 DIAGNOSIS — J449 Chronic obstructive pulmonary disease, unspecified: Secondary | ICD-10-CM | POA: Diagnosis not present

## 2018-03-11 DIAGNOSIS — I6350 Cerebral infarction due to unspecified occlusion or stenosis of unspecified cerebral artery: Secondary | ICD-10-CM | POA: Diagnosis not present

## 2018-03-11 DIAGNOSIS — I10 Essential (primary) hypertension: Secondary | ICD-10-CM | POA: Diagnosis not present

## 2018-03-11 DIAGNOSIS — E119 Type 2 diabetes mellitus without complications: Secondary | ICD-10-CM | POA: Diagnosis not present

## 2018-03-15 ENCOUNTER — Other Ambulatory Visit: Payer: Self-pay

## 2018-03-15 DIAGNOSIS — J441 Chronic obstructive pulmonary disease with (acute) exacerbation: Secondary | ICD-10-CM | POA: Diagnosis not present

## 2018-03-15 DIAGNOSIS — J9601 Acute respiratory failure with hypoxia: Secondary | ICD-10-CM | POA: Diagnosis not present

## 2018-03-15 DIAGNOSIS — I110 Hypertensive heart disease with heart failure: Secondary | ICD-10-CM | POA: Diagnosis not present

## 2018-03-15 DIAGNOSIS — M1990 Unspecified osteoarthritis, unspecified site: Secondary | ICD-10-CM | POA: Diagnosis not present

## 2018-03-15 DIAGNOSIS — I5033 Acute on chronic diastolic (congestive) heart failure: Secondary | ICD-10-CM | POA: Diagnosis not present

## 2018-03-15 DIAGNOSIS — G4733 Obstructive sleep apnea (adult) (pediatric): Secondary | ICD-10-CM | POA: Diagnosis not present

## 2018-03-15 DIAGNOSIS — E119 Type 2 diabetes mellitus without complications: Secondary | ICD-10-CM | POA: Diagnosis not present

## 2018-03-15 DIAGNOSIS — F17210 Nicotine dependence, cigarettes, uncomplicated: Secondary | ICD-10-CM | POA: Diagnosis not present

## 2018-03-15 MED ORDER — LISINOPRIL 2.5 MG PO TABS: 2.5000 mg | ORAL_TABLET | Freq: Every day | ORAL | 3 refills | Status: AC

## 2018-03-15 MED ORDER — NICOTINE 7 MG/24HR TD PT24: 7.0000 mg | MEDICATED_PATCH | Freq: Every day | TRANSDERMAL | 3 refills | Status: DC

## 2018-03-16 DIAGNOSIS — M1990 Unspecified osteoarthritis, unspecified site: Secondary | ICD-10-CM | POA: Diagnosis not present

## 2018-03-16 DIAGNOSIS — I5033 Acute on chronic diastolic (congestive) heart failure: Secondary | ICD-10-CM | POA: Diagnosis not present

## 2018-03-16 DIAGNOSIS — J9601 Acute respiratory failure with hypoxia: Secondary | ICD-10-CM | POA: Diagnosis not present

## 2018-03-16 DIAGNOSIS — F17210 Nicotine dependence, cigarettes, uncomplicated: Secondary | ICD-10-CM | POA: Diagnosis not present

## 2018-03-16 DIAGNOSIS — G4733 Obstructive sleep apnea (adult) (pediatric): Secondary | ICD-10-CM | POA: Diagnosis not present

## 2018-03-16 DIAGNOSIS — I110 Hypertensive heart disease with heart failure: Secondary | ICD-10-CM | POA: Diagnosis not present

## 2018-03-16 DIAGNOSIS — E119 Type 2 diabetes mellitus without complications: Secondary | ICD-10-CM | POA: Diagnosis not present

## 2018-03-16 DIAGNOSIS — J441 Chronic obstructive pulmonary disease with (acute) exacerbation: Secondary | ICD-10-CM | POA: Diagnosis not present

## 2018-03-17 ENCOUNTER — Other Ambulatory Visit: Payer: Self-pay | Admitting: *Deleted

## 2018-03-17 ENCOUNTER — Encounter: Payer: Self-pay | Admitting: *Deleted

## 2018-03-17 ENCOUNTER — Other Ambulatory Visit: Payer: Self-pay

## 2018-03-17 ENCOUNTER — Telehealth: Payer: Self-pay

## 2018-03-17 DIAGNOSIS — F17210 Nicotine dependence, cigarettes, uncomplicated: Secondary | ICD-10-CM | POA: Diagnosis not present

## 2018-03-17 DIAGNOSIS — I5033 Acute on chronic diastolic (congestive) heart failure: Secondary | ICD-10-CM | POA: Diagnosis not present

## 2018-03-17 DIAGNOSIS — E119 Type 2 diabetes mellitus without complications: Secondary | ICD-10-CM | POA: Diagnosis not present

## 2018-03-17 DIAGNOSIS — J441 Chronic obstructive pulmonary disease with (acute) exacerbation: Secondary | ICD-10-CM | POA: Diagnosis not present

## 2018-03-17 DIAGNOSIS — I110 Hypertensive heart disease with heart failure: Secondary | ICD-10-CM | POA: Diagnosis not present

## 2018-03-17 DIAGNOSIS — G4733 Obstructive sleep apnea (adult) (pediatric): Secondary | ICD-10-CM | POA: Diagnosis not present

## 2018-03-17 DIAGNOSIS — J9601 Acute respiratory failure with hypoxia: Secondary | ICD-10-CM | POA: Diagnosis not present

## 2018-03-17 DIAGNOSIS — M1990 Unspecified osteoarthritis, unspecified site: Secondary | ICD-10-CM | POA: Diagnosis not present

## 2018-03-17 NOTE — Patient Outreach (Signed)
[facility]) Care Management  [facility] Care Manager  03/17/2018   Karen Perez 1951/02/17 [id number]  Subjective:  Patient is  67 year old female who recently discharged from [facility] following a hospital stay for congestive heart failure. This social worker went to complete post cute care visit with patient and found that she had discharged home on 03/10/18 with [facility].   Objective:   Encounter Medications:  Outpatient Encounter Medications as of 03/17/2018  Medication Sig Note  . acetaminophen (TYLENOL 8 HOUR ARTHRITIS PAIN) 650 MG CR tablet Take 1,300 mg by mouth every 8 (eight) hours as needed for pain.   Marland Kitchen amLODipine (NORVASC) 10 MG tablet Take 1 tablet (10 mg total) by mouth daily. (Patient not taking: Reported on 02/22/2018)   . atorvastatin (LIPITOR) 20 MG tablet Take 1 tablet (20 mg total) by mouth daily.   . clopidogrel (PLAVIX) 75 MG tablet Take 1 tablet (75 mg total) by mouth daily. 02/22/2018: Pt out of medicaiton.   . diphenhydramine-acetaminophen (TYLENOL PM) 25-500 MG TABS tablet Take 2 tablets by mouth at bedtime as needed (sleep).   . fluticasone (FLONASE) 50 MCG/ACT nasal spray spray one spray in each nostril twice daily as needed for allergies.   . Fluticasone-Salmeterol (ADVAIR) 250-50 MCG/DOSE AEPB Inhale 1 puff into the lungs 2 (two) times daily.   . furosemide (LASIX) 40 MG tablet Take 1 tablet (40 mg total) by mouth 2 (two) times daily.   Marland Kitchen ipratropium-albuterol (DUONEB) 0.5-2.5 (3) MG/3ML SOLN Take 3 mLs by nebulization every 4 (four) hours as needed.   Marland Kitchen KOMBIGLYZE XR 5-500 MG TB24 TAKE 1 TABLET BY MOUTH EVERY DAY FOR DIABETES   . lisinopril (PRINIVIL,ZESTRIL) 2.5 MG tablet Take 1 tablet (2.5 mg total) by mouth daily.   . nicotine (NICODERM CQ - DOSED IN MG/24 HR) 7 mg/24hr patch Place 1 patch (7 mg total) onto the skin daily.   . pantoprazole (PROTONIX) 40 MG tablet Take 1 tablet (40 mg total) by mouth daily.   .  potassium chloride (K-DUR) 10 MEQ tablet Take 2 tablets (20 mEq total) by mouth daily.   . predniSONE (DELTASONE) 10 MG tablet Take 1 tablet (10 mg total) by mouth daily. (Patient not taking: Reported on 02/22/2018)   . SPIRIVA RESPIMAT 1.25 MCG/ACT AERS USE ONE INHALATION A DAY   . traMADol (ULTRAM) 50 MG tablet TAKE 1 TABLET BY MOUTH TWICE A DAY FOR KNEE PAIN AS NEEDED (Patient not taking: Reported on 02/22/2018)    No facility-administered encounter medications on file as of 03/17/2018.     Functional Status:  In your present state of health, do you have any difficulty performing the following activities: 03/17/2018 02/23/2018  Hearing? N N  Vision? N N  Difficulty concentrating or making decisions? N N  Walking or climbing stairs? Y Y  Dressing or bathing? Y Y  Doing errands, shopping? Y N  Preparing Food and eating ? N -  Using the Toilet? N -  In the past six months, have you accidently leaked urine? N -  Do you have problems with loss of bowel control? N -  Managing your Medications? N -  Managing your Finances? N -  Housekeeping or managing your Housekeeping? Y -  Some recent data might be hidden    Fall/Depression Screening: Fall Risk  03/17/2018 02/22/2018  Falls in the past year? Yes No  Number falls in past yr: 2 or more -  Injury with  Fall? Yes -  Risk Factor Category  High Fall Risk -  Risk for fall due to : History of fall(s);Impaired balance/gait;Impaired mobility -  Follow up Education provided;Falls prevention discussed -   PHQ 2/9 Scores 03/17/2018 02/22/2018  PHQ - 2 Score 2 6  PHQ- 9 Score 3 -    Assessment:  Patient discharged to home with [facility].  Plan: This social worker will inform [name], LCSW to assess community resource needs following her discharge home.   Adriana Reams [facility] Care Management [phone]

## 2018-03-17 NOTE — Patient Outreach (Signed)
[facility]) Care Management  03/17/2018  [name] [birth year] [id number]   CSW was able to make initial contact with patient today to perform phone assessment, as well as assess and assist with social work needs and services.  CSW introduced self, explained role and types of services provided through [facility] ([facility]).  CSW further explained to patient that CSW works with patient's RNCM, also with [facility], Thea Silversmith. CSW then explained the reason for the call, indicating that [name] thought that patient was still residing at the skilled nursing facility, [facility], and that patient would need assistance with the transition back home.  CSW was able to obtain two HIPAA compliant identifiers from patient, which included patient's name and date of birth. Patient reported that she was discharged from [facility] on Monday, March 14, 2018, [place] where patient was residing to receive short-term rehabilitative services, returning home to live with her daughter, Karen Perez.  Patient reported that home health services were arranged through [facility], but that services have not yet started.  CSW was able to make contact with a representative from [facility], who reports that patient's nurse with [facility] will be by to perform an initial home visit with patient on Friday, Mar 18, 2018.  Other home health services that were arranged for patient include a physical therapist, occupational therapist and bath aide.  Patient reports that she already has all the equipment she needs in the home, which includes a CBG Meter, wheelchair and bedside commode.   Patient reports that she has a vehicle and is able to drive herself to and from her physician appointments.  However, patient reported that her daughter, [name] likes to go with her to her appointments so she always makes arrangements  to transport her.  Patient indicated that she is able to afford her prescription medications and was encouraged to continue to take them exactly as prescribed.  Patient reports having food in her pantry and that she is able to afford to buy groceries.  Patient was not interested in completing her Advanced Directives ([place] documents).  Patient denies being able to identify any social work specific needs at present. CSW will perform a case closure on patient, as all goals of treatment have been met from social work standpoint and no additional social work needs have been identified at this time.  CSW will notify patient's RNCM with [facility], Thea Silversmith of CSW's plans to close patient's case.  CSW will fax an update to patient's Primary Care Physician, Dr. Clayborn Bigness to ensure that they are aware of CSW's involvement with patient's plan of care.   Nat Christen, BSW, MSW, LCSW  Licensed Education officer, [facility]  Mailing [place] [address] [place], [place] [address] Physical Address-[address], [place], [place] [address] Toll Free Main # [phone] Fax # [phone] Cell # [phone]  Office # [phone] [email or web address]

## 2018-03-17 NOTE — Telephone Encounter (Signed)
Gave verbal order to [facility] Occupational therapist ([phone]) for 2 times a week for 2 week and 1 times a week for 4 weeks heather (NP) aware

## 2018-03-17 NOTE — Patient Outreach (Addendum)
[facility]) Care Management  03/17/2018  Karen Perez [birth year] [id number]   [employer] Referral received on 03/16/18. Discharged from [facility] on 03/14/18.   RNCM called to complete transition of care. Two patient identifiers confirmed. Client reports that she is doing well. She states that she has been contacted by home health and has her follow up appointments scheduled.   Medications reviewed. She denies any questions or concerns about her medications.  Client sounds a little short of breath as she is talking, but client denies being short of breath. Family member in to see client during call. . She denies any questions or concerns at this time.  RNCM provided contact number and encouraged client to call as needed. RNCM also provided 24 hour nurse advice contact number and encouraged to use as needed.  Plan: referral to community care cooridnator  Kathyrn Sheriff, RN, MSN, [facility] Community Care Coordinator Cell: [phone]

## 2018-03-18 ENCOUNTER — Telehealth: Payer: Self-pay | Admitting: Pulmonary Disease

## 2018-03-18 ENCOUNTER — Telehealth: Payer: Self-pay

## 2018-03-18 ENCOUNTER — Encounter: Payer: Self-pay | Admitting: Pulmonary Disease

## 2018-03-18 ENCOUNTER — Ambulatory Visit: Payer: Medicare HMO | Admitting: Pulmonary Disease

## 2018-03-18 VITALS — BP 120/66 | HR 110 | Ht 66.0 in | Wt 267.0 lb

## 2018-03-18 DIAGNOSIS — Z23 Encounter for immunization: Secondary | ICD-10-CM | POA: Diagnosis not present

## 2018-03-18 DIAGNOSIS — Z6841 Body Mass Index (BMI) 40.0 and over, adult: Secondary | ICD-10-CM

## 2018-03-18 DIAGNOSIS — G4733 Obstructive sleep apnea (adult) (pediatric): Secondary | ICD-10-CM | POA: Diagnosis not present

## 2018-03-18 DIAGNOSIS — J449 Chronic obstructive pulmonary disease, unspecified: Secondary | ICD-10-CM | POA: Diagnosis not present

## 2018-03-18 NOTE — Telephone Encounter (Signed)
Trey Paula from [facility] requested verbal orders for PT for 2 x week for 2 wks, 1 x week for 2 weeks, 1 x week for every other week for 4 weeks for strengthening and gait training.  I gave approval for orders.  dbs

## 2018-03-18 NOTE — Telephone Encounter (Signed)
Spoke with Karen Perez and advised her that we are part of the [facility] system and we see the hospital discharge and medications that were updated. She understood and nothing further needed at this time.

## 2018-03-18 NOTE — Addendum Note (Signed)
Addended by: Maurene Capes on: 03/18/2018 12:12 PM   Modules accepted: Orders

## 2018-03-18 NOTE — Telephone Encounter (Signed)
lmtcb for daughter Inetta Fermo. An AVS with current medication list is sent with every patient at the end of their office visit. Daughter Inetta Fermo is on DPR although this has not yet been scanned in to chart- pt is being seen for a consult visit today.

## 2018-03-18 NOTE — Telephone Encounter (Signed)
Pt daughter Inetta Fermo is calling back [phone]

## 2018-03-18 NOTE — Progress Notes (Signed)
   Subjective:    Patient ID: Karen Perez, female    DOB: July 23, 1951, 67 y.o.   MRN: [id number]  HPI    Review of Systems  HENT: Positive for postnasal drip.   Respiratory: Positive for wheezing.   Allergic/Immunologic: Positive for environmental allergies.       Objective:   Physical Exam        Assessment & Plan:

## 2018-03-18 NOTE — Progress Notes (Signed)
[facility]  Chief Complaint  Patient presents with  . Pulmonary consult    Has felt bad for about 5 weeks now Wheezing, PCP sent her to the hospital on 02/17/18    Vital signs: BP 120/66 (BP Location: Left Arm, Cuff Size: Normal)   Pulse (!) 110   Ht  (1.676 m)   Wt 267 lb (121.1 kg)   SpO2 90%   BMI 43.09 kg/m   History of Present Illness: Karen Perez is a 67 y.o. female former smoker with COPD, sleep apnea, VCD.  I last saw her in 2011.  She was in hospital recently for exacerbation of COPD.  Doing better.  Got new CPAP from her nursing home.  Not having cough, wheeze sputum, fever, swelling, hemoptysis.  Quit smoking prior to her hospitalization.    Physical Exam:  General - pleasant Eyes - pupils reactive ENT - no sinus tenderness, no oral exudate, no LAN, raspy voice Cardiac - regular, no murmur Chest - no wheeze, rales Abd - soft, non tender Ext - no edema Skin - no rashes Neuro - walks with walker Psych - normal mood  Assessment/Plan:  COPD with chronic bronchitis and emphysema. - continue spiriva, advair - pneumovax today  Obstructive sleep apnea. - continue CPAP qhs  Obesity. - discussed importance of weight loss   Patient Instructions  Pneumonia 23 shot today  Follow up in 6 months    Coralyn Helling, MD [facility] Pulmonary/Critical Care 03/18/2018, 12:01 PM  Flow Sheet  Pulmonary tests: PFT 08/01/09 >> FEV1 1.21 (51%), FEV1% 70, TLC 3.48 (68%), DLCO 42% CT angio chest 02/23/18 >> centrilobular emphysema  Sleep tests: PSG 04/14/04 > RDI 20  Cardiac tests: Echo 02/23/18 >> EF 65 to 70%, grade 1 DD  Review of Systems: Review of Systems  HENT: Positive for postnasal drip.   Respiratory: Positive for wheezing.   Allergic/Immunologic: Positive for environmental allergies.   Past Medical History: She  has a past medical history of Arthritis, Asthma, Brain aneurysm, COPD (chronic obstructive pulmonary  disease) (HCC), Diabetes mellitus without complication (HCC), Hypertension, and Stroke (HCC) (2003).  Past Surgical History: She  has a past surgical history that includes Cerebral aneurysm repair (2013).  Family History: Her family history includes Diabetes Mellitus II in her daughter.  Social History: She  reports that she quit smoking about 4 weeks ago. She has never used smokeless tobacco. She reports that she does not drink alcohol or use drugs.  Medications: Allergies as of 03/18/2018      Reactions   Amoxicillin-pot Clavulanate    REACTION: Diarrhea   Neosporin [neomycin-bacitracin Zn-polymyx] Hives      Medication List        Accurate as of 03/18/18 12:01 PM. Always use your most recent med list.          amLODipine 10 MG tablet Commonly known as:  NORVASC Take 1 tablet (10 mg total) by mouth daily.   atorvastatin 20 MG tablet Commonly known as:  LIPITOR Take 1 tablet (20 mg total) by mouth daily.   clopidogrel 75 MG tablet Commonly known as:  PLAVIX Take 1 tablet (75 mg total) by mouth daily.   diphenhydramine-acetaminophen 25-500 MG Tabs tablet Commonly known as:  TYLENOL PM Take 2 tablets by mouth at bedtime as needed (sleep).   fluticasone 50 MCG/ACT nasal spray Commonly known as:  FLONASE Place 2 sprays into both nostrils daily.   Fluticasone-Salmeterol 250-50 MCG/DOSE Aepb Commonly known  as:  ADVAIR Inhale 1 puff into the lungs 2 (two) times daily.   furosemide 40 MG tablet Commonly known as:  LASIX Take 1 tablet (40 mg total) by mouth 2 (two) times daily.   ipratropium-albuterol 0.5-2.5 (3) MG/3ML Soln Commonly known as:  DUONEB Take 3 mLs by nebulization every 4 (four) hours as needed.   lisinopril 2.5 MG tablet Commonly known as:  PRINIVIL,ZESTRIL Take 1 tablet (2.5 mg total) by mouth daily.   pantoprazole 40 MG tablet Commonly known as:  PROTONIX Take 1 tablet (40 mg total) by mouth daily.   potassium chloride 10 MEQ tablet Commonly  known as:  K-DUR Take 2 tablets (20 mEq total) by mouth daily.   SPIRIVA RESPIMAT 1.25 MCG/ACT Aers Generic drug:  Tiotropium Bromide Monohydrate USE ONE INHALATION A DAY   traMADol 50 MG tablet Commonly known as:  ULTRAM TAKE 1 TABLET BY MOUTH TWICE A DAY FOR KNEE PAIN AS NEEDED   TYLENOL 8 HOUR ARTHRITIS PAIN 650 MG CR tablet Generic drug:  acetaminophen Take 1,300 mg by mouth every 8 (eight) hours as needed for pain.

## 2018-03-18 NOTE — Patient Instructions (Signed)
Pneumonia 23 shot today  Follow up in 6 months

## 2018-03-18 NOTE — Telephone Encounter (Signed)
Cindy from [facility] called and requested verbal order for social work to help pt with medications and transportation.  I approved another visit.  dbs

## 2018-03-19 ENCOUNTER — Emergency Department (HOSPITAL_COMMUNITY): Payer: Medicare HMO

## 2018-03-19 ENCOUNTER — Encounter (HOSPITAL_COMMUNITY): Payer: Self-pay | Admitting: Nurse Practitioner

## 2018-03-19 ENCOUNTER — Observation Stay (HOSPITAL_COMMUNITY)
Admission: EM | Admit: 2018-03-19 | Discharge: 2018-03-21 | Disposition: A | Discharge status: Home Health | Payer: Medicare HMO | Attending: Internal Medicine | Admitting: Internal Medicine

## 2018-03-19 DIAGNOSIS — E041 Nontoxic single thyroid nodule: Secondary | ICD-10-CM | POA: Diagnosis not present

## 2018-03-19 DIAGNOSIS — G8929 Other chronic pain: Secondary | ICD-10-CM | POA: Insufficient documentation

## 2018-03-19 DIAGNOSIS — Z794 Long term (current) use of insulin: Secondary | ICD-10-CM | POA: Insufficient documentation

## 2018-03-19 DIAGNOSIS — I5032 Chronic diastolic (congestive) heart failure: Secondary | ICD-10-CM | POA: Diagnosis not present

## 2018-03-19 DIAGNOSIS — M25561 Pain in right knee: Secondary | ICD-10-CM | POA: Diagnosis not present

## 2018-03-19 DIAGNOSIS — E669 Obesity, unspecified: Secondary | ICD-10-CM

## 2018-03-19 DIAGNOSIS — Z8673 Personal history of transient ischemic attack (TIA), and cerebral infarction without residual deficits: Secondary | ICD-10-CM | POA: Insufficient documentation

## 2018-03-19 DIAGNOSIS — J441 Chronic obstructive pulmonary disease with (acute) exacerbation: Principal | ICD-10-CM | POA: Insufficient documentation

## 2018-03-19 DIAGNOSIS — I110 Hypertensive heart disease with heart failure: Secondary | ICD-10-CM | POA: Insufficient documentation

## 2018-03-19 DIAGNOSIS — Z79899 Other long term (current) drug therapy: Secondary | ICD-10-CM | POA: Insufficient documentation

## 2018-03-19 DIAGNOSIS — Z8679 Personal history of other diseases of the circulatory system: Secondary | ICD-10-CM | POA: Insufficient documentation

## 2018-03-19 DIAGNOSIS — M79604 Pain in right leg: Secondary | ICD-10-CM | POA: Diagnosis not present

## 2018-03-19 DIAGNOSIS — K219 Gastro-esophageal reflux disease without esophagitis: Secondary | ICD-10-CM | POA: Insufficient documentation

## 2018-03-19 DIAGNOSIS — Z9889 Other specified postprocedural states: Secondary | ICD-10-CM | POA: Insufficient documentation

## 2018-03-19 DIAGNOSIS — Z880 Allergy status to penicillin: Secondary | ICD-10-CM | POA: Insufficient documentation

## 2018-03-19 DIAGNOSIS — Z7902 Long term (current) use of antithrombotics/antiplatelets: Secondary | ICD-10-CM | POA: Insufficient documentation

## 2018-03-19 DIAGNOSIS — J9602 Acute respiratory failure with hypercapnia: Secondary | ICD-10-CM

## 2018-03-19 DIAGNOSIS — R0902 Hypoxemia: Secondary | ICD-10-CM | POA: Diagnosis not present

## 2018-03-19 DIAGNOSIS — J383 Other diseases of vocal cords: Secondary | ICD-10-CM | POA: Insufficient documentation

## 2018-03-19 DIAGNOSIS — I2510 Atherosclerotic heart disease of native coronary artery without angina pectoris: Secondary | ICD-10-CM | POA: Diagnosis not present

## 2018-03-19 DIAGNOSIS — M170 Bilateral primary osteoarthritis of knee: Secondary | ICD-10-CM | POA: Insufficient documentation

## 2018-03-19 DIAGNOSIS — J310 Chronic rhinitis: Secondary | ICD-10-CM | POA: Insufficient documentation

## 2018-03-19 DIAGNOSIS — Z86718 Personal history of other venous thrombosis and embolism: Secondary | ICD-10-CM | POA: Insufficient documentation

## 2018-03-19 DIAGNOSIS — Z6841 Body Mass Index (BMI) 40.0 and over, adult: Secondary | ICD-10-CM | POA: Insufficient documentation

## 2018-03-19 DIAGNOSIS — M25562 Pain in left knee: Secondary | ICD-10-CM

## 2018-03-19 DIAGNOSIS — E119 Type 2 diabetes mellitus without complications: Secondary | ICD-10-CM | POA: Insufficient documentation

## 2018-03-19 DIAGNOSIS — E785 Hyperlipidemia, unspecified: Secondary | ICD-10-CM | POA: Diagnosis not present

## 2018-03-19 DIAGNOSIS — I700 Atherosclerosis of aorta: Secondary | ICD-10-CM | POA: Diagnosis not present

## 2018-03-19 DIAGNOSIS — R0602 Shortness of breath: Secondary | ICD-10-CM | POA: Diagnosis not present

## 2018-03-19 DIAGNOSIS — Z833 Family history of diabetes mellitus: Secondary | ICD-10-CM | POA: Insufficient documentation

## 2018-03-19 DIAGNOSIS — Z881 Allergy status to other antibiotic agents status: Secondary | ICD-10-CM | POA: Insufficient documentation

## 2018-03-19 DIAGNOSIS — I10 Essential (primary) hypertension: Secondary | ICD-10-CM | POA: Diagnosis present

## 2018-03-19 DIAGNOSIS — J9601 Acute respiratory failure with hypoxia: Secondary | ICD-10-CM | POA: Insufficient documentation

## 2018-03-19 DIAGNOSIS — G4733 Obstructive sleep apnea (adult) (pediatric): Secondary | ICD-10-CM | POA: Diagnosis not present

## 2018-03-19 DIAGNOSIS — E1169 Type 2 diabetes mellitus with other specified complication: Secondary | ICD-10-CM | POA: Diagnosis not present

## 2018-03-19 DIAGNOSIS — Z87891 Personal history of nicotine dependence: Secondary | ICD-10-CM | POA: Insufficient documentation

## 2018-03-19 DIAGNOSIS — M545 Low back pain: Secondary | ICD-10-CM | POA: Diagnosis not present

## 2018-03-19 DIAGNOSIS — F329 Major depressive disorder, single episode, unspecified: Secondary | ICD-10-CM | POA: Insufficient documentation

## 2018-03-19 DIAGNOSIS — B354 Tinea corporis: Secondary | ICD-10-CM | POA: Diagnosis not present

## 2018-03-19 DIAGNOSIS — R600 Localized edema: Secondary | ICD-10-CM | POA: Insufficient documentation

## 2018-03-19 DIAGNOSIS — Z7951 Long term (current) use of inhaled steroids: Secondary | ICD-10-CM | POA: Insufficient documentation

## 2018-03-19 LAB — COMPREHENSIVE METABOLIC PANEL
ALK PHOS: 58 U/L (ref 38–126)
ALT: 20 U/L (ref 14–54)
AST: 18 U/L (ref 15–41)
Albumin: 3.5 g/dL (ref 3.5–5.0)
Anion gap: 11 (ref 5–15)
BUN: 9 mg/dL (ref 6–20)
CALCIUM: 9.1 mg/dL (ref 8.9–10.3)
CHLORIDE: 107 mmol/L (ref 101–111)
CO2: 28 mmol/L (ref 22–32)
CREATININE: 0.66 mg/dL (ref 0.44–1.00)
GFR calc Af Amer: >60 mL/min (ref >60)
GFR calc non Af Amer: >60 mL/min (ref >60)
Glucose, Bld: 106 mg/dL — ABNORMAL HIGH (ref 65–99)
Potassium: 3.5 mmol/L (ref 3.5–5.1)
SODIUM: 146 mmol/L — AB (ref 135–145)
Total Bilirubin: 0.2 mg/dL — ABNORMAL LOW (ref 0.3–1.2)
Total Protein: 7 g/dL (ref 6.5–8.1)

## 2018-03-19 LAB — CBC WITH DIFFERENTIAL/PLATELET
Basophils Absolute: 0 10*3/uL (ref 0.0–0.1)
Basophils Relative: 0 %
EOS ABS: 0.1 10*3/uL (ref 0.0–0.7)
EOS PCT: 1 %
HCT: 45 % (ref 36.0–46.0)
Hemoglobin: 13.9 g/dL (ref 12.0–15.0)
LYMPHS ABS: 1.4 10*3/uL (ref 0.7–4.0)
Lymphocytes Relative: 28 %
MCH: 29.8 pg (ref 26.0–34.0)
MCHC: 30.9 g/dL (ref 30.0–36.0)
MCV: 96.4 fL (ref 78.0–100.0)
MONOS PCT: 12 %
Monocytes Absolute: 0.6 10*3/uL (ref 0.1–1.0)
Neutro Abs: 3 10*3/uL (ref 1.7–7.7)
Neutrophils Relative %: 59 %
PLATELETS: 201 10*3/uL (ref 150–400)
RBC: 4.67 MIL/uL (ref 3.87–5.11)
RDW: 14.2 % (ref 11.5–15.5)
WBC: 5.1 10*3/uL (ref 4.0–10.5)

## 2018-03-19 LAB — CBG MONITORING, ED: Glucose-Capillary: 106 mg/dL — ABNORMAL HIGH (ref 65–99)

## 2018-03-19 LAB — BRAIN NATRIURETIC PEPTIDE: B NATRIURETIC PEPTIDE 5: 29.5 pg/mL (ref 0.0–100.0)

## 2018-03-19 MED ORDER — DOXYCYCLINE HYCLATE 100 MG PO TABS
100.0000 mg | ORAL_TABLET | Freq: Two times a day (BID) | ORAL | Status: DC
  Administered 2018-03-20 – 2018-03-21 (×4): 100 mg via ORAL
  Filled 2018-03-19 (×4): qty 1

## 2018-03-19 MED ORDER — INSULIN ASPART 100 UNIT/ML ~~LOC~~ SOLN: 0.0000 [IU] | SUBCUTANEOUS | Status: DC

## 2018-03-19 MED ORDER — IPRATROPIUM-ALBUTEROL 0.5-2.5 (3) MG/3ML IN SOLN
3.0000 mL | Freq: Once | RESPIRATORY_TRACT | Status: AC
  Administered 2018-03-19: 3 mL via RESPIRATORY_TRACT
  Filled 2018-03-19: qty 3

## 2018-03-19 MED ORDER — PANTOPRAZOLE SODIUM 40 MG PO TBEC
40.0000 mg | DELAYED_RELEASE_TABLET | Freq: Every day | ORAL | Status: DC
  Administered 2018-03-20 – 2018-03-21 (×2): 40 mg via ORAL
  Filled 2018-03-19 (×2): qty 1

## 2018-03-19 MED ORDER — ENOXAPARIN SODIUM 60 MG/0.6ML ~~LOC~~ SOLN
60.0000 mg | Freq: Every day | SUBCUTANEOUS | Status: DC
  Administered 2018-03-20 – 2018-03-21 (×2): 60 mg via SUBCUTANEOUS
  Filled 2018-03-19 (×3): qty 0.6

## 2018-03-19 MED ORDER — ACETAMINOPHEN 500 MG PO TABS
1000.0000 mg | ORAL_TABLET | Freq: Three times a day (TID) | ORAL | Status: DC | PRN
  Filled 2018-03-19: qty 2

## 2018-03-19 MED ORDER — CLOPIDOGREL BISULFATE 75 MG PO TABS
75.0000 mg | ORAL_TABLET | Freq: Every day | ORAL | Status: DC
  Administered 2018-03-20 – 2018-03-21 (×2): 75 mg via ORAL
  Filled 2018-03-19 (×2): qty 1

## 2018-03-19 MED ORDER — POTASSIUM CHLORIDE CRYS ER 10 MEQ PO TBCR
20.0000 meq | EXTENDED_RELEASE_TABLET | Freq: Every day | ORAL | Status: DC
  Administered 2018-03-20 – 2018-03-21 (×2): 20 meq via ORAL
  Filled 2018-03-19 (×2): qty 2

## 2018-03-19 MED ORDER — AMLODIPINE BESYLATE 5 MG PO TABS
10.0000 mg | ORAL_TABLET | Freq: Every day | ORAL | Status: DC
  Administered 2018-03-20 – 2018-03-21 (×2): 10 mg via ORAL
  Filled 2018-03-19 (×2): qty 2

## 2018-03-19 MED ORDER — ONDANSETRON HCL 4 MG PO TABS: 4.0000 mg | ORAL_TABLET | Freq: Four times a day (QID) | ORAL | Status: DC | PRN

## 2018-03-19 MED ORDER — PREDNISONE 20 MG PO TABS
40.0000 mg | ORAL_TABLET | Freq: Every day | ORAL | Status: DC
  Administered 2018-03-20 – 2018-03-21 (×2): 40 mg via ORAL
  Filled 2018-03-19 (×2): qty 2

## 2018-03-19 MED ORDER — ALBUTEROL SULFATE (2.5 MG/3ML) 0.083% IN NEBU
2.5000 mg | INHALATION_SOLUTION | RESPIRATORY_TRACT | Status: DC | PRN
  Administered 2018-03-20: 2.5 mg via RESPIRATORY_TRACT
  Filled 2018-03-19: qty 3

## 2018-03-19 MED ORDER — MOMETASONE FURO-FORMOTEROL FUM 200-5 MCG/ACT IN AERO
2.0000 | INHALATION_SPRAY | Freq: Two times a day (BID) | RESPIRATORY_TRACT | Status: DC
  Administered 2018-03-20 – 2018-03-21 (×3): 2 via RESPIRATORY_TRACT
  Filled 2018-03-19: qty 8.8

## 2018-03-19 MED ORDER — ONDANSETRON HCL 4 MG/2ML IJ SOLN: 4.0000 mg | Freq: Four times a day (QID) | INTRAMUSCULAR | Status: DC | PRN

## 2018-03-19 MED ORDER — ATORVASTATIN CALCIUM 10 MG PO TABS
20.0000 mg | ORAL_TABLET | Freq: Every day | ORAL | Status: DC
  Administered 2018-03-20: 20 mg via ORAL
  Filled 2018-03-19: qty 2
  Filled 2018-03-19 (×2): qty 1

## 2018-03-19 MED ORDER — DIPHENHYDRAMINE-APAP (SLEEP) 25-500 MG PO TABS: 2.0000 | ORAL_TABLET | Freq: Every evening | ORAL | Status: DC | PRN

## 2018-03-19 MED ORDER — IPRATROPIUM-ALBUTEROL 0.5-2.5 (3) MG/3ML IN SOLN
3.0000 mL | Freq: Four times a day (QID) | RESPIRATORY_TRACT | Status: DC
  Administered 2018-03-20 – 2018-03-21 (×5): 3 mL via RESPIRATORY_TRACT
  Filled 2018-03-19 (×6): qty 3

## 2018-03-19 MED ORDER — FUROSEMIDE 40 MG PO TABS
40.0000 mg | ORAL_TABLET | Freq: Two times a day (BID) | ORAL | Status: DC
  Administered 2018-03-20 – 2018-03-21 (×3): 40 mg via ORAL
  Filled 2018-03-19 (×3): qty 1

## 2018-03-19 MED ORDER — FLUTICASONE PROPIONATE 50 MCG/ACT NA SUSP
2.0000 | Freq: Every day | NASAL | Status: DC
  Administered 2018-03-20: 2 via NASAL
  Filled 2018-03-19: qty 16

## 2018-03-19 MED ORDER — TRAMADOL HCL 50 MG PO TABS
50.0000 mg | ORAL_TABLET | Freq: Two times a day (BID) | ORAL | Status: DC | PRN
  Administered 2018-03-20 (×2): 50 mg via ORAL
  Filled 2018-03-19 (×2): qty 1

## 2018-03-19 MED ORDER — METHYLPREDNISOLONE SODIUM SUCC 125 MG IJ SOLR
125.0000 mg | Freq: Once | INTRAMUSCULAR | Status: AC
Start: 2018-03-19 — End: 2018-03-19
  Administered 2018-03-19: 125 mg via INTRAVENOUS
  Filled 2018-03-19: qty 2

## 2018-03-19 MED ORDER — LISINOPRIL 2.5 MG PO TABS
2.5000 mg | ORAL_TABLET | Freq: Every day | ORAL | Status: DC
  Administered 2018-03-20 – 2018-03-21 (×2): 2.5 mg via ORAL
  Filled 2018-03-19 (×2): qty 1

## 2018-03-19 MED ORDER — INSULIN ASPART 100 UNIT/ML ~~LOC~~ SOLN
0.0000 [IU] | Freq: Three times a day (TID) | SUBCUTANEOUS | Status: DC
  Administered 2018-03-20: 5 [IU] via SUBCUTANEOUS
  Administered 2018-03-20 (×2): 3 [IU] via SUBCUTANEOUS
  Administered 2018-03-21: 1 [IU] via SUBCUTANEOUS

## 2018-03-19 NOTE — ED Notes (Signed)
[phone] Karen Perez- Daughter

## 2018-03-19 NOTE — ED Triage Notes (Signed)
Pt presents with c/o shortness of breath, wheezing and unilateral (right leg) edema, same leg she reports severe achy pain and warmth to touch. Reports hx of DVTs, CHF,and prior hospitalizations related to exacerbation. Family at bedside reports that pt is "not her true self and appears weak."

## 2018-03-19 NOTE — ED Notes (Signed)
Bed: [facility]ALB Expected date:  Expected time:  Means of arrival:  Comments: 

## 2018-03-19 NOTE — H&P (Addendum)
History and Physical    Karen Perez:[id number] DOB: Mar 14, 1951 DOA: 03/19/2018  PCP: Lyndon Code, MD  Patient coming from: Home  I have personally briefly reviewed patient's old medical records in [facility]  Chief Complaint: SOB  HPI: Karen Perez is a 67 y.o. female with medical history significant of COPD, chronic diastolic CHF, OSA supposed to have started CPAP after last hospital stay but not started yet, HTN, DVT some 11 years ago, DM2 diet controlled.  Patient presents to the ED with c/o SOB by daughter.  Patient with wheezing, increased DOE, feeling bad for 5 weeks as noted with Dr. Craige Cotta (pulm) office visit yesterday.  Patients biggest complaint is leg / knee pain, usually L>R but now R>L.  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  She does report swelling of the R leg for past couple of days.   ED Course: Satting 90% at rest on room air.  Desats with any activity.  Given solumedrol, breathing treatments.  Hospitalist asked to admit.  Not yet on home O2 at baseline.   Review of Systems: As per HPI otherwise 10 point review of systems negative.   Past Medical History:  Diagnosis Date  . Arthritis   . Asthma   . Brain aneurysm   . COPD (chronic obstructive pulmonary disease) (HCC)   . Diabetes mellitus without complication (HCC)   . Hypertension   . Stroke [facility]) 2003    Past Surgical History:  Procedure Laterality Date  . CEREBRAL ANEURYSM REPAIR  2013     reports that she quit smoking about 4 weeks ago. She has never used smokeless tobacco. She reports that she does not drink alcohol or use drugs.  Allergies  Allergen Reactions  . Amoxicillin-Pot Clavulanate     REACTION: Diarrhea  . Neosporin [Neomycin-Bacitracin Zn-Polymyx] Hives    Family History  Problem Relation Age of Onset  . Diabetes Mellitus II Daughter      Prior to Admission medications   Medication Sig Start Date End Date Taking? Authorizing Provider    acetaminophen (TYLENOL 8 HOUR ARTHRITIS PAIN) 650 MG CR tablet Take 1,300 mg by mouth every 8 (eight) hours as needed for pain.    [provider]  amLODipine (NORVASC) 10 MG tablet Take 1 tablet (10 mg total) by mouth daily. 01/13/18   Carlean Jews, NP  atorvastatin (LIPITOR) 20 MG tablet Take 1 tablet (20 mg total) by mouth daily. 01/13/18   Carlean Jews, NP  clopidogrel (PLAVIX) 75 MG tablet Take 1 tablet (75 mg total) by mouth daily. 01/14/18   Carlean Jews, NP  diphenhydramine-acetaminophen (TYLENOL PM) 25-500 MG TABS tablet Take 2 tablets by mouth at bedtime as needed (sleep).    [provider]  fluticasone (FLONASE) 50 MCG/ACT nasal spray Place 2 sprays into both nostrils daily.    [provider]  Fluticasone-Salmeterol (ADVAIR) 250-50 MCG/DOSE AEPB Inhale 1 puff into the lungs 2 (two) times daily. 12/16/17   Carlean Jews, NP  furosemide (LASIX) 40 MG tablet Take 1 tablet (40 mg total) by mouth 2 (two) times daily. 03/02/18   Amin, Ankit Chirag, MD  ipratropium-albuterol (DUONEB) 0.5-2.5 (3) MG/3ML SOLN Take 3 mLs by nebulization every 4 (four) hours as needed. 03/02/18   Amin, Loura Halt, MD  lisinopril (PRINIVIL,ZESTRIL) 2.5 MG tablet Take 1 tablet (2.5 mg total) by mouth daily. 03/15/18   Lyndon Code, MD  pantoprazole (PROTONIX) 40 MG tablet Take  1 tablet (40 mg total) by mouth daily. 03/03/18   Amin, Loura Halt, MD  potassium chloride (K-DUR) 10 MEQ tablet Take 2 tablets (20 mEq total) by mouth daily. 03/02/18   [name], MD  SPIRIVA RESPIMAT 1.25 MCG/ACT AERS USE ONE INHALATION A DAY 10/20/17   [provider]  traMADol (ULTRAM) 50 MG tablet TAKE 1 TABLET BY MOUTH TWICE A DAY FOR KNEE PAIN AS NEEDED 10/28/17   Carlean Jews, NP    Physical Exam: Vitals:   03/19/18 2100 03/19/18 2130 03/19/18 2252 03/19/18 2337  BP: 125/75 136/75 135/68 129/77  Pulse: 66 68 88 77  Resp: (!) 34 (!) 27 (!) 23 (!) 27  Temp:       TempSrc:      SpO2: 91% 90% 91% 93%    Constitutional: NAD, calm, comfortable Eyes: PERRL, lids and conjunctivae normal ENMT: Mucous membranes are moist. Posterior pharynx clear of any exudate or lesions.Normal dentition.  Neck: normal, supple, no masses, no thyromegaly Respiratory: Increased RR, wheezing bilaterally. Cardiovascular: Regular rate and rhythm, no murmurs / rubs / gallops. No extremity edema. 2+ pedal pulses. No carotid bruits.  Abdomen: no tenderness, no masses palpated. No hepatosplenomegaly. Bowel sounds positive.  Musculoskeletal: no clubbing / cyanosis. No joint deformity upper and lower extremities. Good ROM, no contractures. Normal muscle tone.  Skin: Does have RLE edema of ankle.  No redness, no tenderness, no warmth. Neurologic: CN 2-12 grossly intact. Sensation intact, DTR normal. Strength 5/5 in all 4.  Psychiatric: Normal judgment and insight. Alert and oriented x 3. Normal mood.    Labs on Admission: I have personally reviewed following labs and imaging studies  CBC: Recent Labs  Lab 03/19/18 2021  WBC 5.1  NEUTROABS 3.0  HGB 13.9  HCT 45.0  MCV 96.4  PLT 201   Basic Metabolic Panel: Recent Labs  Lab 03/19/18 2021  NA 146*  K 3.5  CL 107  CO2 28  GLUCOSE 106*  BUN 9  CREATININE 0.66  CALCIUM 9.1   GFR: Estimated Creatinine Clearance: 89.3 mL/min (by C-G formula based on SCr of 0.66 mg/dL). Liver Function Tests: Recent Labs  Lab 03/19/18 2021  AST 18  ALT 20  ALKPHOS 58  BILITOT 0.2*  PROT 7.0  ALBUMIN 3.5   No results for input(s): LIPASE, AMYLASE in the last 168 hours. No results for input(s): AMMONIA in the last 168 hours. Coagulation Profile: No results for input(s): INR, PROTIME in the last 168 hours. Cardiac Enzymes: No results for input(s): CKTOTAL, CKMB, CKMBINDEX, TROPONINI in the last 168 hours. BNP (last 3 results) No results for input(s): PROBNP in the last 8760 hours. HbA1C: No results for input(s): HGBA1C in  the last 72 hours. CBG: Recent Labs  Lab 03/19/18 2334  GLUCAP 106*   Lipid Profile: No results for input(s): CHOL, HDL, LDLCALC, TRIG, CHOLHDL, LDLDIRECT in the last 72 hours. Thyroid Function Tests: No results for input(s): TSH, T4TOTAL, FREET4, T3FREE, THYROIDAB in the last 72 hours. Anemia Panel: No results for input(s): VITAMINB12, FOLATE, FERRITIN, TIBC, IRON, RETICCTPCT in the last 72 hours. Urine analysis:    Component Value Date/Time   COLORURINE straw 02/07/2009 0000   APPEARANCEUR Hazy 02/07/2009 0000   LABSPEC 1.020 02/07/2009 0000   PHURINE 5.0 02/07/2009 0000   HGBUR large 02/07/2009 0000   BILIRUBINUR negative 02/07/2009 0000   UROBILINOGEN negative 02/07/2009 0000   NITRITE negative 02/07/2009 0000    Radiological Exams on Admission: Dg Chest 2 View  Result Date: 03/19/2018 CLINICAL DATA:  Pt presents with c/o shortness of breath, wheezing and unilateral (right leg) edema, same leg she reports severe achy pain and warmth to touch. Reports hx of DVTs, CHF,and prior hospitalizations related to exacerbation. Family at.*comment was truncated* EXAM: CHEST - 2 VIEW COMPARISON:  None. FINDINGS: Normal cardiac silhouette. Mild central venous pulmonary congestion. No effusion infiltrate pneumothorax. No acute osseous abnormality. IMPRESSION: Mild central venous pulmonary congestion. Electronically Signed   By: Genevive Bi M.D.   On: 03/19/2018 20:51    EKG: Independently reviewed.  Assessment/Plan Principal Problem:   COPD with acute exacerbation (HCC) Active Problems:   OBESITY, MORBID   OBSTRUCTIVE SLEEP APNEA   Essential hypertension   Acute respiratory failure with hypoxia (HCC)   Diabetes mellitus type 2 in obese (HCC)   Acute respiratory failure with hypoxia and hypercapnia (HCC)    1. COPD exacerbation - and acute resp failure with hypoxia - 1. COPD pathway 2. Prednisone 3. Scheduled and PRN nebs 4. O2 via  1. Suspect she may need home  O2 5. Cont pulse ox 2. R leg pain - 1. Given RLE swelling, SOB, h/o DVT in past, will check CTA chest to r/o PE. 2. Also obtain US to r/o DVT 3. Otherwise her B knee pain if above are negative is likely just due to her known end stage osteoarthritis of B knees. 3. OSA - CPAP QHS 4. HTN - 1. Continue home BP meds 5. Chronic diastolic CHF - 1. Cont home lasix 2. BNP not elevated today 6. DM2 - diet controlled at baseline 1. Will put on sensitive SSI AC while here and on steroids  DVT prophylaxis: Lovenox Code Status: Full Family Communication: No family in room Disposition Plan: Home after admit Consults called: None Admission status: Place in obs   GARDNER, Heywood Iles. DO [facility] Pager [phone]  If 7AM-7PM, please contact day team taking care of patient [email or web address] Password [email or web address]  03/19/2018, 11:45 PM

## 2018-03-19 NOTE — ED Provider Notes (Addendum)
[facility]-EMERGENCY DEPT Provider Note   CSN: [id number] Arrival date & time: 03/19/18  1829     History   Chief Complaint Chief Complaint  Patient presents with  . Shortness of Breath  . Congestive Heart Failure    Hx of    HPI Karen Perez is a 67 y.o. female.  Patient brought in accompanied by her daughter daughter's main concern was shortness of breath.  Patient's main concern is been bilateral knee pain usually left worse than right but now right worse than left.  It appears patient followed by orthopedics in [place] for this and definitely has significant degenerative changes to her knees.  However the breathing problem does appear to be a concern.  Patient not on home oxygen.  Patient with admission April 9 through April 17 for acute respiratory failure with hypoxia COPD acute exacerbation.  Patient seen by her pulmonary doctor yesterday May 3 that note notes that she is been feeling bad for 5 weeks.  Had wheezing at that time.  No change in her meds.  In addition patient has diabetes.  Patient also is to be started on CPAP for sleep apnea.  That has not been arranged yet.     Past Medical History:  Diagnosis Date  . Arthritis   . Asthma   . Brain aneurysm   . COPD (chronic obstructive pulmonary disease) (HCC)   . Diabetes mellitus without complication (HCC)   . Hypertension   . Stroke [facility]) 2003    Patient Active Problem List   Diagnosis Date Noted  . Acute respiratory failure with hypoxia (HCC) 02/22/2018  . COPD with acute exacerbation (HCC) 02/22/2018  . Diabetes mellitus type 2 in obese (HCC) 02/22/2018  . Acute respiratory failure with hypoxia and hypercapnia (HCC) 02/22/2018  . HOT FLASHES 05/02/2010  . DIABETES MELLITUS, TYPE II 01/24/2010  . SKIN TAG 10/29/2009  . KNEE PAIN, BILATERAL 10/04/2009  . CHRONIC RHINITIS 08/01/2009  . VOCAL CORD DISORDER 08/01/2009  . GERD 08/01/2009  . SKIN RASH 06/28/2009  . HYPOXEMIA  05/21/2009  . Nonspecific (abnormal) findings on radiological and other examination of body structure 05/16/2009  . ABNORMAL CHEST XRAY 05/16/2009  . OBESITY, MORBID 04/30/2009  . TOBACCO USER 04/30/2009  . TINEA CORPORIS 04/19/2009  . HEMOCCULT POSITIVE STOOL 02/07/2009  . LEG CRAMPS 12/24/2008  . DERMATITIS, ALLERGIC 06/28/2008  . BACK PAIN, LUMBAR 05/04/2008  . OBSTRUCTIVE SLEEP APNEA 10/17/2007  . Essential hypertension 04/20/2007  . HYPERLIPIDEMIA 03/21/2007  . DEPRESSION 03/21/2007  . CEREBRAL ANEURYSM 03/21/2007  . COPD 03/21/2007  . ARTHRITIS 03/21/2007  . DVT, HX OF 03/21/2007    Past Surgical History:  Procedure Laterality Date  . CEREBRAL ANEURYSM REPAIR  2013     OB History   None      Home Medications    Prior to Admission medications   Medication Sig Start Date End Date Taking? Authorizing Provider  acetaminophen (TYLENOL 8 HOUR ARTHRITIS PAIN) 650 MG CR tablet Take 1,300 mg by mouth every 8 (eight) hours as needed for pain.    [provider]  amLODipine (NORVASC) 10 MG tablet Take 1 tablet (10 mg total) by mouth daily. 01/13/18   Carlean Jews, NP  atorvastatin (LIPITOR) 20 MG tablet Take 1 tablet (20 mg total) by mouth daily. 01/13/18   Carlean Jews, NP  clopidogrel (PLAVIX) 75 MG tablet Take 1 tablet (75 mg total) by mouth daily. 01/14/18   Carlean Jews, NP  diphenhydramine-acetaminophen (  TYLENOL PM) 25-500 MG TABS tablet Take 2 tablets by mouth at bedtime as needed (sleep).    [provider]  fluticasone (FLONASE) 50 MCG/ACT nasal spray Place 2 sprays into both nostrils daily.    [provider]  Fluticasone-Salmeterol (ADVAIR) 250-50 MCG/DOSE AEPB Inhale 1 puff into the lungs 2 (two) times daily. 12/16/17   Carlean Jews, NP  furosemide (LASIX) 40 MG tablet Take 1 tablet (40 mg total) by mouth 2 (two) times daily. 03/02/18   Amin, Ankit Chirag, MD  ipratropium-albuterol (DUONEB) 0.5-2.5 (3) MG/3ML SOLN Take 3 mLs  by nebulization every 4 (four) hours as needed. 03/02/18   Amin, Loura Halt, MD  lisinopril (PRINIVIL,ZESTRIL) 2.5 MG tablet Take 1 tablet (2.5 mg total) by mouth daily. 03/15/18   Lyndon Code, MD  pantoprazole (PROTONIX) 40 MG tablet Take 1 tablet (40 mg total) by mouth daily. 03/03/18   Amin, Loura Halt, MD  potassium chloride (K-DUR) 10 MEQ tablet Take 2 tablets (20 mEq total) by mouth daily. 03/02/18   [name], MD  SPIRIVA RESPIMAT 1.25 MCG/ACT AERS USE ONE INHALATION A DAY 10/20/17   [provider]  traMADol (ULTRAM) 50 MG tablet TAKE 1 TABLET BY MOUTH TWICE A DAY FOR KNEE PAIN AS NEEDED 10/28/17   Carlean Jews, NP    Family History Family History  Problem Relation Age of Onset  . Diabetes Mellitus II Daughter     Social History Social History   Tobacco Use  . Smoking status: Former Smoker    Last attempt to quit: 02/15/2018    Years since quitting: 0.0  . Smokeless tobacco: Never Used  Substance Use Topics  . Alcohol use: No  . Drug use: No     Allergies   Amoxicillin-pot clavulanate and Neosporin [neomycin-bacitracin zn-polymyx]   Review of Systems Review of Systems  Constitutional: Negative for fever.  HENT: Negative for congestion.   Eyes: Negative for visual disturbance.  Respiratory: Positive for shortness of breath. Negative for cough.   Cardiovascular: Positive for leg swelling.  Gastrointestinal: Negative for abdominal pain, nausea and vomiting.  Genitourinary: Negative for dysuria.  Musculoskeletal: Positive for arthralgias and joint swelling. Negative for myalgias.  Neurological: Negative for syncope and headaches.  Hematological: Does not bruise/bleed easily.  Psychiatric/Behavioral: Negative for confusion.     Physical Exam Updated Vital Signs BP 135/68 (BP Location: Left Arm)   Pulse 88   Temp 98.1 F (36.7 C) (Oral)   Resp (!) 23   SpO2 91%   Physical Exam  Constitutional: She is oriented to person, place, and  time. She appears well-developed and well-nourished. No distress.  HENT:  Head: Normocephalic and atraumatic.  Mouth/Throat: Oropharynx is clear and moist.  Eyes: Pupils are equal, round, and reactive to light. Conjunctivae and EOM are normal.  Neck: Neck supple.  Cardiovascular: Normal rate and regular rhythm.  Pulmonary/Chest: She has wheezes.  Decreased breath sounds and increased respiratory rate.  Abdominal: Soft. Bowel sounds are normal. There is no tenderness.  Musculoskeletal: She exhibits no edema.  No significant swelling to the knees bilaterally.  No deformity to the thigh or lower leg.  No significant edema.  No increased warmth or erythema.    Neurological: She is alert and oriented to person, place, and time. No cranial nerve deficit or sensory deficit. She exhibits normal muscle tone. Coordination normal.  Skin: Skin is warm.  Nursing note and vitals reviewed.    ED Treatments / Results  Labs (all  labs ordered are listed, but only abnormal results are displayed) Labs Reviewed  COMPREHENSIVE METABOLIC PANEL - Abnormal; Notable for the following components:      Result Value   Sodium 146 (*)    Glucose, Bld 106 (*)    Total Bilirubin 0.2 (*)    All other components within normal limits  CBC WITH DIFFERENTIAL/PLATELET  BRAIN NATRIURETIC PEPTIDE    EKG EKG Interpretation  Date/Time:  Saturday Mar 19 2018 18:54:39 EDT Ventricular Rate:  72 PR Interval:  158 QRS Duration: 94 QT Interval:  382 QTC Calculation: 418 R Axis:   61 Text Interpretation:  Normal sinus rhythm Nonspecific T wave abnormality Abnormal ECG No significant change since last tracing Confirmed by Vanetta Mulders [id number]) on 03/19/2018 7:46:07 PM   Radiology Dg Chest 2 View  Result Date: 03/19/2018 CLINICAL DATA:  Pt presents with c/o shortness of breath, wheezing and unilateral (right leg) edema, same leg she reports severe achy pain and warmth to touch. Reports hx of DVTs, CHF,and prior  hospitalizations related to exacerbation. Family at.*comment was truncated* EXAM: CHEST - 2 VIEW COMPARISON:  None. FINDINGS: Normal cardiac silhouette. Mild central venous pulmonary congestion. No effusion infiltrate pneumothorax. No acute osseous abnormality. IMPRESSION: Mild central venous pulmonary congestion. Electronically Signed   By: Genevive Bi M.D.   On: 03/19/2018 20:51    Procedures Procedures (including critical care time)  Medications Ordered in ED Medications  doxycycline (VIBRA-TABS) tablet 100 mg (has no administration in time range)  predniSONE (DELTASONE) tablet 40 mg (has no administration in time range)  mometasone-formoterol (DULERA) 200-5 MCG/ACT inhaler 2 puff (has no administration in time range)  albuterol (PROVENTIL) (2.5 MG/3ML) 0.083% nebulizer solution 2.5 mg (has no administration in time range)  ipratropium-albuterol (DUONEB) 0.5-2.5 (3) MG/3ML nebulizer solution 3 mL (has no administration in time range)  amLODipine (NORVASC) tablet 10 mg (has no administration in time range)  atorvastatin (LIPITOR) tablet 20 mg (has no administration in time range)  clopidogrel (PLAVIX) tablet 75 mg (has no administration in time range)  diphenhydramine-acetaminophen (TYLENOL PM) 25-500 MG per tablet 2 tablet (has no administration in time range)  acetaminophen (TYLENOL) CR tablet 1,300 mg (has no administration in time range)  fluticasone (FLONASE) 50 MCG/ACT nasal spray 2 spray (has no administration in time range)  lisinopril (PRINIVIL,ZESTRIL) tablet 2.5 mg (has no administration in time range)  furosemide (LASIX) tablet 40 mg (has no administration in time range)  pantoprazole (PROTONIX) EC tablet 40 mg (has no administration in time range)  potassium chloride (K-DUR) CR tablet 20 mEq (has no administration in time range)  traMADol (ULTRAM) tablet 50 mg (has no administration in time range)  ondansetron (ZOFRAN) tablet 4 mg (has no administration in time range)     Or  ondansetron (ZOFRAN) injection 4 mg (has no administration in time range)  enoxaparin (LOVENOX) injection 40 mg (has no administration in time range)  insulin aspart (novoLOG) injection 0-9 Units (has no administration in time range)  ipratropium-albuterol (DUONEB) 0.5-2.5 (3) MG/3ML nebulizer solution 3 mL (3 mLs Nebulization Given 03/19/18 2047)  methylPREDNISolone sodium succinate (SOLU-MEDROL) 125 mg/2 mL injection 125 mg (125 mg Intravenous Given 03/19/18 2307)  ipratropium-albuterol (DUONEB) 0.5-2.5 (3) MG/3ML nebulizer solution 3 mL (3 mLs Nebulization Given 03/19/18 2312)     Initial Impression / Assessment and Plan / ED Course  I have reviewed the triage vital signs and the nursing notes.  Pertinent labs & imaging results that were available during my care of the patient  were reviewed by me and considered in my medical decision making (see chart for details).    Patient with wheezing upon arrival.  Patient received albuterol Atrovent nebulizer.  Wheezing improved but still with bilateral decreased air movement on both sides.  Even after the initial nebulizer patient's room air sats would be low 90s and then would drift down to around 88%.  This is without any exertion.  Observe this on and off for a while suspicious that patient may be ready for home oxygen  Chest x-ray negative for pneumonia.  Also negative for any significant pulmonary edema.  Patient's BNP not elevated.  Do not feel there is any significant component of CHF here.  Also patient has no lower extremity swelling.  No history of any chest pain.  Feel that this is a exacerbation of her COPD although not with severe wheezing.  Patient will receive second nebulizer and started on Solu-Medrol.  Hospitalist contacted for admission due to the hypoxia.  It appears that patient's bilateral knee complaint with particular right knee giving out at times is secondary to bone-on-bone patient states orthopedic doctor told her that  patient probably not a good candidate for knee replacement.   Final Clinical Impressions(s) / ED Diagnoses   Final diagnoses:  COPD exacerbation (HCC)  Hypoxia  Chronic pain of both knees    ED Discharge Orders    None       Vanetta Mulders, MD 03/19/18 2330    Vanetta Mulders, MD 03/19/18 [phone]

## 2018-03-20 ENCOUNTER — Encounter (HOSPITAL_COMMUNITY): Payer: Self-pay | Admitting: Radiology

## 2018-03-20 ENCOUNTER — Observation Stay (HOSPITAL_COMMUNITY): Payer: Medicare HMO

## 2018-03-20 ENCOUNTER — Other Ambulatory Visit: Payer: Self-pay

## 2018-03-20 ENCOUNTER — Observation Stay (HOSPITAL_BASED_OUTPATIENT_CLINIC_OR_DEPARTMENT_OTHER): Payer: Medicare HMO

## 2018-03-20 DIAGNOSIS — M7989 Other specified soft tissue disorders: Secondary | ICD-10-CM

## 2018-03-20 DIAGNOSIS — E669 Obesity, unspecified: Secondary | ICD-10-CM | POA: Diagnosis not present

## 2018-03-20 DIAGNOSIS — J9602 Acute respiratory failure with hypercapnia: Secondary | ICD-10-CM

## 2018-03-20 DIAGNOSIS — I10 Essential (primary) hypertension: Secondary | ICD-10-CM | POA: Diagnosis not present

## 2018-03-20 DIAGNOSIS — J9601 Acute respiratory failure with hypoxia: Secondary | ICD-10-CM

## 2018-03-20 DIAGNOSIS — E1169 Type 2 diabetes mellitus with other specified complication: Secondary | ICD-10-CM

## 2018-03-20 DIAGNOSIS — J441 Chronic obstructive pulmonary disease with (acute) exacerbation: Secondary | ICD-10-CM

## 2018-03-20 DIAGNOSIS — G4733 Obstructive sleep apnea (adult) (pediatric): Secondary | ICD-10-CM

## 2018-03-20 DIAGNOSIS — M79609 Pain in unspecified limb: Secondary | ICD-10-CM | POA: Diagnosis not present

## 2018-03-20 DIAGNOSIS — R0602 Shortness of breath: Secondary | ICD-10-CM | POA: Diagnosis not present

## 2018-03-20 LAB — GLUCOSE, CAPILLARY
GLUCOSE-CAPILLARY: 170 mg/dL — AB (ref 65–99)
GLUCOSE-CAPILLARY: 231 mg/dL — AB (ref 65–99)
Glucose-Capillary: 255 mg/dL — ABNORMAL HIGH (ref 65–99)
Glucose-Capillary: 238 mg/dL — ABNORMAL HIGH (ref 65–99)
Glucose-Capillary: 212 mg/dL — ABNORMAL HIGH (ref 65–99)

## 2018-03-20 MED ORDER — ACETAMINOPHEN 500 MG PO TABS
1000.0000 mg | ORAL_TABLET | Freq: Every evening | ORAL | Status: DC | PRN
  Administered 2018-03-20 (×2): 1000 mg via ORAL
  Filled 2018-03-20: qty 2

## 2018-03-20 MED ORDER — DIPHENHYDRAMINE HCL 50 MG PO CAPS
50.0000 mg | ORAL_CAPSULE | Freq: Every evening | ORAL | Status: DC | PRN
  Administered 2018-03-20: 50 mg via ORAL
  Filled 2018-03-20: qty 1

## 2018-03-20 MED ORDER — IOPAMIDOL (ISOVUE-370) INJECTION 76%
INTRAVENOUS | Status: AC
  Filled 2018-03-20: qty 100

## 2018-03-20 MED ORDER — IOPAMIDOL (ISOVUE-370) INJECTION 76%
100.0000 mL | Freq: Once | INTRAVENOUS | Status: AC | PRN
  Administered 2018-03-20: 100 mL via INTRAVENOUS

## 2018-03-20 NOTE — Progress Notes (Signed)
Rx Brief note: Lovenox  Wt=121 kg, CrCl~89 ml/min, BMI=43  Rx adjusted Lovenox to 60 mg daily for BMI>30  Thanks Lorenza Evangelist 03/20/2018 1:42 AM

## 2018-03-20 NOTE — Progress Notes (Signed)
PROGRESS NOTE    Karen Perez  ZOX:[id number] DOB: 1951/09/18 DOA: 03/19/2018 PCP: Lyndon Code, MD   Brief Narrative:  HPI On 03/19/2018 by Dr. Lyda Perone Karen Perez is a 67 y.o. female with medical history significant of COPD, chronic diastolic CHF, OSA supposed to have started CPAP after last hospital stay but not started yet, HTN, DVT some 11 years ago, DM2 diet controlled.  Patient presents to the ED with c/o SOB by daughter.  Patient with wheezing, increased DOE, feeling bad for 5 weeks as noted with Dr. Craige Cotta (pulm) office visit yesterday.  Patients biggest complaint is leg / knee pain, usually L>R but now R>L.  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  She does report swelling of the R leg for past couple of days.  Assessment & Plan   COPD exacerbation/acute respiratory failure with hypoxia -Chest x-ray shows some mild pulmonary congestion -CTA chest showed no pulmonary embolism.  Enlarged pulmonary artery compatible with pulmonary hypertension. -Oxygen saturations dropped to 86% -Continue doxycycline, nebulizer treatments, prednisone, Dulera -Follows with Dr. Craige Cotta, pulmnologist -suspect patient will need O2 on discharge  Right lower extremity pain/edema -Patient had history of DVT in the past -CTA chest negative for PE -Pending right lower extremity Doppler  Essential hypertension -Continue amlodipine, lisinopril and Lasix  Chronic diastolic heart failure -Currently does not appear to be volume overloaded, BNP 29.5 -Echocardiogram 02/23/2018 shows an EF of 65 to 70%, grade 1 diastolic dysfunction -Monitor intake and output, daily weights -Continue Lasix  Diabetes mellitus, type II -Diet controlled however since patient currently on steroids we will continue insulin sliding scale CBG monitoring  Obstructive sleep apnea -Continue CPAP nightly  Bilateral osteoarthritis of the knees -Continue pain control -PT consulted  Thyroid  nodule -CTA chest showed an 18 mm nodule in the right lobe thyroid -Discussed with patient, recommended outpatient thyroid ultrasound  DVT Prophylaxis Lovenox  Code Status: Full  Family Communication: None at bedside  Disposition Plan: observation, pending improvement in respiratory failure and workup   Consultants None  Procedures  None  Antibiotics   Anti-infectives (From admission, onward)   Start     Dose/Rate Route Frequency Ordered Stop   03/19/18 2330  doxycycline (VIBRA-TABS) tablet 100 mg     100 mg Oral Every 12 hours 03/19/18 2316        Subjective:   Karen Perez seen and examined today.  Patient feels her breathing is improved.  Does admit to having shortness of breath with movement.  Denies current chest pain, abdominal pain, nausea vomiting, diarrhea or constipation, dizziness or headache.  Objective:   Vitals:   03/20/18 0645 03/20/18 0757 03/20/18 0839 03/20/18 0929  BP: (!) 147/71     Pulse: 90     Resp: (!) 22     Temp: 98.2 F (36.8 C)  98 F (36.7 C)   TempSrc: Oral  Oral   SpO2: 97% (!) 86%  94%  Weight:      Height:        Intake/Output Summary (Last 24 hours) at 03/20/2018 1223 Last data filed at 03/20/2018 0900 Gross per 24 hour  Intake 360 ml  Output -  Net 360 ml   Filed Weights   03/20/18 0207  Weight: 122.6 kg (270 lb 4.8 oz)    Exam  General: Well developed, well nourished, NAD, appears stated age  HEENT: NCAT,mucous membranes moist.   Neck: Supple  Cardiovascular: S1 S2 auscultated, no rubs, murmurs  or gallops. Regular rate and rhythm.  Respiratory: Upper lung fields expiratory wheezing, otherwise diminished breath sounds  Abdomen: Soft, obese, nontender, nondistended, + bowel sounds  Extremities: warm dry without cyanosis clubbing. RLE edema  Neuro: AAOx3, nonfocal  Psych: Normal affect and demeanor with intact judgement and insight   Data Reviewed: I have personally reviewed following labs and imaging  studies  CBC: Recent Labs  Lab 03/19/18 2021  WBC 5.1  NEUTROABS 3.0  HGB 13.9  HCT 45.0  MCV 96.4  PLT 201   Basic Metabolic Panel: Recent Labs  Lab 03/19/18 2021  NA 146*  K 3.5  CL 107  CO2 28  GLUCOSE 106*  BUN 9  CREATININE 0.66  CALCIUM 9.1   GFR: Estimated Creatinine Clearance: 91.4 mL/min (by C-G formula based on SCr of 0.66 mg/dL). Liver Function Tests: Recent Labs  Lab 03/19/18 2021  AST 18  ALT 20  ALKPHOS 58  BILITOT 0.2*  PROT 7.0  ALBUMIN 3.5   No results for input(s): LIPASE, AMYLASE in the last 168 hours. No results for input(s): AMMONIA in the last 168 hours. Coagulation Profile: No results for input(s): INR, PROTIME in the last 168 hours. Cardiac Enzymes: No results for input(s): CKTOTAL, CKMB, CKMBINDEX, TROPONINI in the last 168 hours. BNP (last 3 results) No results for input(s): PROBNP in the last 8760 hours. HbA1C: No results for input(s): HGBA1C in the last 72 hours. CBG: Recent Labs  Lab 03/19/18 2334 03/20/18 0140 03/20/18 0742 03/20/18 1200  GLUCAP 106* 170* 231* 255*   Lipid Profile: No results for input(s): CHOL, HDL, LDLCALC, TRIG, CHOLHDL, LDLDIRECT in the last 72 hours. Thyroid Function Tests: No results for input(s): TSH, T4TOTAL, FREET4, T3FREE, THYROIDAB in the last 72 hours. Anemia Panel: No results for input(s): VITAMINB12, FOLATE, FERRITIN, TIBC, IRON, RETICCTPCT in the last 72 hours. Urine analysis:    Component Value Date/Time   COLORURINE straw 02/07/2009 0000   APPEARANCEUR Hazy 02/07/2009 0000   LABSPEC 1.020 02/07/2009 0000   PHURINE 5.0 02/07/2009 0000   HGBUR large 02/07/2009 0000   BILIRUBINUR negative 02/07/2009 0000   UROBILINOGEN negative 02/07/2009 0000   NITRITE negative 02/07/2009 0000   Sepsis Labs: (procalcitonin:4,lacticidven:4)  )No results found for this or any previous visit (from the past 240 hour(s)).    Radiology Studies: Dg Chest 2 View  Result Date:  03/19/2018 CLINICAL DATA:  Pt presents with c/o shortness of breath, wheezing and unilateral (right leg) edema, same leg she reports severe achy pain and warmth to touch. Reports hx of DVTs, CHF,and prior hospitalizations related to exacerbation. Family at.*comment was truncated* EXAM: CHEST - 2 VIEW COMPARISON:  None. FINDINGS: Normal cardiac silhouette. Mild central venous pulmonary congestion. No effusion infiltrate pneumothorax. No acute osseous abnormality. IMPRESSION: Mild central venous pulmonary congestion. Electronically Signed   By: Genevive Bi M.D.   On: 03/19/2018 20:51   Ct Angio Chest Pe W/cm &/or Wo Cm  Result Date: 03/20/2018 CLINICAL DATA:  67 y/o F; shortness of breath, wheezing, dyspnea on exertion. PE suspected, high pretest probability. EXAM: CT ANGIOGRAPHY CHEST WITH CONTRAST TECHNIQUE: Multidetector CT imaging of the chest was performed using the standard protocol during bolus administration of intravenous contrast. Multiplanar CT image reconstructions and MIPs were obtained to evaluate the vascular anatomy. CONTRAST:  ISOVUE-370 IOPAMIDOL (ISOVUE-370) INJECTION 76% COMPARISON:  02/23/2018 CTA chest. FINDINGS: Cardiovascular: Stable mild-to-moderate cardiomegaly. No pericardial effusion. Normal caliber thoracic aorta with mild calcific atherosclerosis. Satisfactory opacification of pulmonary arteries. No pulmonary embolus  identified. Enlarged main pulmonary artery. Mediastinum/Nodes: 18 mm stable nodule in right lobe of thyroid. Normal esophagus. No mediastinal adenopathy. Lungs/Pleura: Mild centrilobular emphysema greatest in the upper lobes. Minor atelectasis in the lower lobes bilaterally. No pleural effusion or pneumothorax. Upper Abdomen: No acute abnormality. Musculoskeletal: No chest wall abnormality. No acute or significant osseous findings. Mild multilevel degenerative changes of the spine. Review of the MIP images confirms the above findings. IMPRESSION: 1. No pulmonary  embolus identified. 2. Enlarged main pulmonary artery compatible pulmonary artery hypertension. 3. Mild to moderate cardiomegaly. 4. Calcific atherosclerosis of coronary arteries and aorta. 5. 18 mm nodule in the right lobe of thyroid, thyroid ultrasound is recommended on a nonemergent basis. 6. Minor atelectasis in the lower lobes. Electronically Signed   By: Mitzi Hansen M.D.   On: 03/20/2018 01:22     Scheduled Meds: . amLODipine  10 mg Oral Daily  . atorvastatin  20 mg Oral q1800  . clopidogrel  75 mg Oral Daily  . doxycycline  100 mg Oral Q12H  . enoxaparin (LOVENOX) injection  60 mg Subcutaneous Daily  . fluticasone  2 spray Each Nare Daily  . furosemide  40 mg Oral BID  . insulin aspart  0-9 Units Subcutaneous TID WC  . iopamidol      . ipratropium-albuterol  3 mL Nebulization QID  . lisinopril  2.5 mg Oral Daily  . mometasone-formoterol  2 puff Inhalation BID  . pantoprazole  40 mg Oral Daily  . potassium chloride  20 mEq Oral Daily  . predniSONE  40 mg Oral Q breakfast   Continuous Infusions:   LOS: 0 days   Time Spent in minutes   30 minutes  Carmin Alvidrez D.O. on 03/20/2018 at 12:23 PM  Between 7am to 7pm - Pager - [phone]  After 7pm go to [email or web address] - password [email or web address]  And look for the night coverage person covering for me after hours  [facility] Office  [phone]

## 2018-03-20 NOTE — Progress Notes (Signed)
PT Cancellation Note  Patient Details Name: Karen Perez MRN: [id number] DOB: 11/29/1950   Cancelled Treatment:    Reason Eval/Treat Not Completed: Medical issues which prohibited therapy(dopplers to assess for DVT pending, note pt  had lovenox at 9:45 this morning, PT protocol indicates 3 hour wait after 1st lovenox dose before initiating mobility. Will check back later today. )   Tamala Ser 03/20/2018, 11:43 AM [phone]

## 2018-03-20 NOTE — Progress Notes (Signed)
VASCULAR LAB PRELIMINARY  PRELIMINARY  PRELIMINARY  PRELIMINARY  Bilateral lower extremity venous duplex completed.    Preliminary report:  There is no obvious evidence of DVT or SVT noted in the visualized veins of the bilateral lower extremities.   Lorene Klimas, RVT 03/20/2018, 1:38 PM

## 2018-03-21 DIAGNOSIS — E669 Obesity, unspecified: Secondary | ICD-10-CM | POA: Diagnosis not present

## 2018-03-21 DIAGNOSIS — J9602 Acute respiratory failure with hypercapnia: Secondary | ICD-10-CM | POA: Diagnosis not present

## 2018-03-21 DIAGNOSIS — E1169 Type 2 diabetes mellitus with other specified complication: Secondary | ICD-10-CM | POA: Diagnosis not present

## 2018-03-21 DIAGNOSIS — I10 Essential (primary) hypertension: Secondary | ICD-10-CM | POA: Diagnosis not present

## 2018-03-21 DIAGNOSIS — G4733 Obstructive sleep apnea (adult) (pediatric): Secondary | ICD-10-CM | POA: Diagnosis not present

## 2018-03-21 DIAGNOSIS — J441 Chronic obstructive pulmonary disease with (acute) exacerbation: Secondary | ICD-10-CM | POA: Diagnosis not present

## 2018-03-21 DIAGNOSIS — J9601 Acute respiratory failure with hypoxia: Secondary | ICD-10-CM | POA: Diagnosis not present

## 2018-03-21 LAB — BASIC METABOLIC PANEL
Anion gap: 9 (ref 5–15)
BUN: 12 mg/dL (ref 6–20)
CALCIUM: 8.8 mg/dL — AB (ref 8.9–10.3)
CO2: 30 mmol/L (ref 22–32)
Chloride: 104 mmol/L (ref 101–111)
Creatinine, Ser: 0.68 mg/dL (ref 0.44–1.00)
GFR calc Af Amer: >60 mL/min (ref >60)
GLUCOSE: 129 mg/dL — AB (ref 65–99)
Potassium: 3.3 mmol/L — ABNORMAL LOW (ref 3.5–5.1)
Sodium: 143 mmol/L (ref 135–145)

## 2018-03-21 LAB — GLUCOSE, CAPILLARY
Glucose-Capillary: 120 mg/dL — ABNORMAL HIGH (ref 65–99)
Glucose-Capillary: 136 mg/dL — ABNORMAL HIGH (ref 65–99)
Glucose-Capillary: 166 mg/dL — ABNORMAL HIGH (ref 65–99)

## 2018-03-21 MED ORDER — POTASSIUM CHLORIDE CRYS ER 10 MEQ PO TBCR
40.0000 meq | EXTENDED_RELEASE_TABLET | Freq: Once | ORAL | Status: AC
  Administered 2018-03-21: 40 meq via ORAL
  Filled 2018-03-21: qty 4

## 2018-03-21 MED ORDER — PREDNISONE 10 MG PO TABS: ORAL_TABLET | ORAL | 0 refills | Status: DC

## 2018-03-21 MED ORDER — DOXYCYCLINE HYCLATE 100 MG PO TABS: 100.0000 mg | ORAL_TABLET | Freq: Two times a day (BID) | ORAL | 0 refills | Status: DC

## 2018-03-21 NOTE — Discharge Instructions (Signed)
Chronic Obstructive Pulmonary Disease Exacerbation  Chronic obstructive pulmonary disease (COPD) is a common lung problem. In COPD, the flow of air from the lungs is limited. COPD exacerbations are times that breathing gets worse and you need extra treatment. Without treatment they can be life threatening. If they happen often, your lungs can become more damaged. If your COPD gets worse, your doctor may treat you with:  ? Medicines.  ? Oxygen.  ? Different ways to clear your airway, such as using a mask.    Follow these instructions at home:  ? Do not smoke.  ? Avoid tobacco smoke and other things that bother your lungs.  ? If given, take your antibiotic medicine as told. Finish the medicine even if you start to feel better.  ? Only take medicines as told by your doctor.  ? Drink enough fluids to keep your pee (urine) clear or pale yellow (unless your doctor has told you not to).  ? Use a cool mist machine (vaporizer).  ? If you use oxygen or a machine that turns liquid medicine into a mist (nebulizer), continue to use them as told.  ? Keep up with shots (vaccinations) as told by your doctor.  ? Exercise regularly.  ? Eat healthy foods.  ? Keep all doctor visits as told.  Get help right away if:  ? You are very short of breath and it gets worse.  ? You have trouble talking.  ? You have bad chest pain.  ? You have blood in your spit (sputum).  ? You have a fever.  ? You keep throwing up (vomiting).  ? You feel weak, or you pass out (faint).  ? You feel confused.  ? You keep getting worse.  This information is not intended to replace advice given to you by your health care provider. Make sure you discuss any questions you have with your health care provider.  Document Released: 10/22/2011 Document Revised: 04/09/2016 Document Reviewed: 07/07/2013  [employer]vier Interactive Patient Education ? 2017 [employer]

## 2018-03-21 NOTE — Evaluation (Signed)
Physical Therapy Evaluation Patient Details Name: Karen Perez MRN: [id number] DOB: 08-02-1951 Today's Date: 03/21/2018   History of Present Illness  67 y.o. female with medical history significant of COPD, chronic diastolic CHF, OSA supposed to have started CPAP after last hospital stay but not started yet, HTN, DVT some 11 years ago, DM2 diet controlled. Pt admitted with SOB and RLE swelling and pain, CTA negative for PE, dopplers for DVT negative bilaterally.'    Clinical Impression  Patient Saturations on Room Air at Rest = 98% Patient Saturations on Room Air while Ambulating = 96% Patient ambulated 300 feet using RW on RA taking a standing break at 150 feet. Supervision assistance required for safety. Patient requires moderate assistance to stand from standard height toilet and bed indicating decreased leg strength. Pt admitted with above diagnosis. Pt currently with functional limitations due to the deficits listed below (see PT Problem List). Pt will benefit from skilled PT to increase their independence and safety with mobility to allow discharge to the venue listed below.     Follow Up Recommendations Home health PT(for post-acute rehab)    Equipment Recommendations  None recommended by PT    Recommendations for Other Services       Precautions / Restrictions Restrictions Weight Bearing Restrictions: No      Mobility  Bed Mobility Overal bed mobility: Needs Assistance Bed Mobility: Supine to Sit     Supine to sit: Supervision     General bed mobility comments: increased time as well  Transfers Overall transfer level: Needs assistance Equipment used: Rolling walker (2 wheeled) Transfers: Sit to/from Stand Sit to Stand: Min assist         General transfer comment: with bed elevated; bed at home is high as well. Min assist to power up and to stabilize RW as pt tends to pull up on RW to stand.  Ambulation/Gait Ambulation/Gait assistance: Supervision Ambulation  Distance (Feet): 300 Feet Assistive device: Rolling walker (2 wheeled) Gait Pattern/deviations: Decreased step length - right;Decreased step length - left;Trunk flexed;Decreased stride length     General Gait Details: steady pace; standing break at 150 feet. Pulse ox monitoring on RA - 96% and above for all positions and activity.  Stairs            Wheelchair Mobility    Modified Rankin (Stroke Patients Only)       Balance Overall balance assessment: Needs assistance Sitting-balance support: Feet supported;No upper extremity supported Sitting balance-Leahy Scale: Fair     Standing balance support: During functional activity;Bilateral upper extremity supported Standing balance-Leahy Scale: Fair                               Pertinent Vitals/Pain Pain Assessment: No/denies pain    Home Living Family/patient expects to be discharged to:: Private residence Living Arrangements: Children Available Help at Discharge: Family;Available 24 hours/day Type of Home: House Home Access: Level entry     Home Layout: One level Home Equipment: Walker - 4 wheels;Bedside commode;Shower seat;Toilet riser;Grab bars - tub/shower;Hand held shower head      Prior Function Level of Independence: Needs assistance   Gait / Transfers Assistance Needed: ambulates with rollator     Comments: still drives     Hand Dominance        Extremity/Trunk Assessment   Upper Extremity Assessment Upper Extremity Assessment: Generalized weakness    Lower Extremity Assessment Lower Extremity Assessment: Generalized weakness  Communication   Communication: No difficulties  Cognition Arousal/Alertness: Awake/alert Behavior During Therapy: WFL for tasks assessed/performed Overall Cognitive Status: Within Functional Limits for tasks assessed                                        General Comments      Exercises     Assessment/Plan    PT  Assessment    PT Problem List Decreased strength;Decreased activity tolerance;Decreased mobility;Decreased balance       PT Treatment Interventions DME instruction;Gait training;Functional mobility training;Therapeutic exercise;Therapeutic activities;Balance training;Neuromuscular re-education;Patient/family education    PT Goals (Current goals can be found in the Care Plan section)  Acute Rehab PT Goals Patient Stated Goal: go back home. PT Goal Formulation: With patient Time For Goal Achievement: 03/28/18 Potential to Achieve Goals: Good    Frequency Min 3X/week   Barriers to discharge        Co-evaluation               AM-PAC PT "6 Clicks" Daily Activity  Outcome Measure Difficulty turning over in bed (including adjusting bedclothes, sheets and blankets)?: A Little Difficulty moving from lying on back to sitting on the side of the bed? : A Little Difficulty sitting down on and standing up from a chair with arms (e.g., wheelchair, bedside commode, etc,.)?: A Lot Help needed moving to and from a bed to chair (including a wheelchair)?: A Little Help needed walking in hospital room?: A Little Help needed climbing 3-5 steps with a railing? : A Lot 6 Click Score: 16    End of Session   Activity Tolerance: Patient tolerated treatment well Patient left: in chair;with call bell/phone within reach(preparing to eat lunch) Nurse Communication: Mobility status PT Visit Diagnosis: Other abnormalities of gait and mobility (R26.89);Muscle weakness (generalized) (M62.81)    Time: 4098-1191 PT Time Calculation (min) (ACUTE ONLY): 30 min   Charges:   PT Evaluation $PT Eval Moderate Complexity: 1 Mod PT Treatments $Gait Training: 8-22 mins   PT G Codes:        Dyllan Hughett D. Hartnett-Rands, MS, PT [place] PT [facility] [place] [facility] [phone] 03/21/2018, 10:15 AM

## 2018-03-21 NOTE — Progress Notes (Signed)
Nutrition Brief Note  Patient identified on the Malnutrition Screening Tool (MST) Report  Wt Readings from Last 15 Encounters:  03/20/18 270 lb 4.8 oz (122.6 kg)  03/18/18 267 lb (121.1 kg)  03/02/18 274 lb 4 oz (124.4 kg)  02/22/18 280 lb 3.2 oz (127.1 kg)  09/13/16 264 lb (119.7 kg)  12/13/07 (!) 282 lb 2.1 oz (128 kg)  02/21/07 (!) 282 lb 3.2 oz (128 kg)   Body mass index is 42.33 kg/m. Patient meets criteria for morbid obesity based on current BMI. Pt reports no recent unintentional changes to weight over the past year. Pt with CHF weight fluctuations may be r/t fluid status.   Current diet order is carb modified, patient is consuming approximately 85-100% of meals at this time. Pt reports a great appetite and that she has been slightly hungry after breakfast time while admitted.   No subcutaneous fat or muscle depletions notified on nutrition-focused physical exam.    Labs and medications reviewed. Pt to be discharged home today. No nutrition interventions warranted at this time. If nutrition issues arise, please consult RD.   Wylene Simmer, MS, RDN, LDN 03/21/2018 12:48 PM

## 2018-03-21 NOTE — Care Management Obs Status (Signed)
MEDICARE OBSERVATION STATUS NOTIFICATION   Patient Details  Name: Karen Perez MRN: [id number] Date of Birth: [birth year]   Medicare Observation Status Notification Given:  Yes    Golda Acre, RN 03/21/2018, 10:19 AM

## 2018-03-21 NOTE — Discharge Summary (Signed)
Physician Discharge Summary  Karen Perez AVW:[id number] DOB: [birth year] DOA: 03/19/2018  PCP: Lyndon Code, MD  Admit date: 03/19/2018 Discharge date: 03/21/2018  Time spent: 45 minutes  Recommendations for Outpatient Follow-up:  Patient will be discharged to home with home health physical and occupational therapy, aide, and social work.  Patient will need to follow up with primary care provider within one week of discharge.  Follow up with pulmonology, Dr. Craige Cotta. Patient should continue medications as prescribed.  Patient should follow a heart healthy/carb modified diet.   Discharge Diagnoses:  COPD exacerbation/acute respiratory failure with hypoxia Right lower extremity pain/edema Essential hypertension Chronic diastolic heart failure Diabetes mellitus, type II Obstructive sleep apnea Bilateral osteoarthritis of the knees Thyroid nodule  Discharge Condition: Stabe  Diet recommendation: heart health/carb modified  Filed Weights   03/20/18 0207  Weight: 122.6 kg (270 lb 4.8 oz)    History of present illness:  On 03/19/2018 by Dr. Berneta Levins [name] a 67 y.o.femalewith medical history significant ofCOPD, chronic diastolic CHF, OSA supposed to have started CPAP after last hospital stay but not started yet, HTN, DVT some 11 years ago, DM2 diet controlled.  Patient presents to the ED with c/o SOB by daughter. Patient with wheezing, increased DOE, feeling bad for 5 weeks as noted with Dr. Craige Cotta (pulm) office visit yesterday.  Patients biggest complaint is leg / knee pain, usually L>R but now R>L. 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  She does report swelling of the R leg for past couple of days.  Hospital Course:  COPD exacerbation/acute respiratory failure with hypoxia -Chest x-ray shows some mild pulmonary congestion -CTA chest showed no pulmonary embolism.  Enlarged pulmonary artery compatible with pulmonary hypertension. -Oxygen  saturations dropped to 86% pm admission -Continue doxycycline, nebulizer treatments, prednisone, Dulera -Follows with Dr. Craige Cotta, pulmnologist -She was able to ambulate with physical therapy today.  Walked approximate 300 feet on room air and maintaining oxygen saturations of 96%.  At rest oxygen saturations are 98%. -Will discharge patient with doxycycline, prednisone taper -continue spiriva and neb treatments  Right lower extremity pain/edema -Patient had history of DVT in the past -CTA chest negative for PE -Lower extremity ultrasound negative for DVT -Discussed different causes of lower extremity swelling with patient's daughter via phone.  Patient is on amlodipine this may be one cause.  Patient currently not having heart failure exacerbation.  Essential hypertension -Continue amlodipine, lisinopril and Lasix  Chronic diastolic heart failure -Currently does not appear to be volume overloaded, BNP 29.5 -Echocardiogram 02/23/2018 shows an EF of 65 to 70%, grade 1 diastolic dysfunction -Monitor intake and output, daily weights -Continue Lasix  Diabetes mellitus, type II -Continue saxagliptin/metformin   Obstructive sleep apnea -Continue CPAP nightly  Bilateral osteoarthritis of the knees -Continue pain control -PT consulted-recommending home health therapy  Thyroid nodule -CTA chest showed an 18 mm nodule in the right lobe thyroid -Discussed with patient, recommended outpatient thyroid ultrasound  Procedures: Lower extremity Doppler  Consultations: None  Discharge Exam: Vitals:   03/21/18 0437 03/21/18 0748  BP: 127/64   Pulse: 71   Resp: 19   Temp: 97.8 F (36.6 C)   SpO2: 91% 95%   Patient feels his breathing has improved.  Denies current chest pain, shortness of breath, abdominal pain, nausea vomiting, diarrhea constipation, dizziness or headache.   General: Well developed, well nourished, NAD, appears stated age  HEENT: NCAT, mucous membranes  moist.  Neck: Supple  Cardiovascular: S1  S2 auscultated, no rubs, murmurs or gallops. Regular rate and rhythm.  Respiratory: Diminished breath sounds with some mild expiratory wheezing  Abdomen: Soft, obese,  nontender, nondistended, + bowel sounds  Extremities: warm dry without cyanosis clubbing. Trace LE edema (improving)  Neuro: AAOx3, nonfocal  Psych: Normal affect and demeanor with intact judgement and insight  Discharge Instructions Discharge Instructions    Discharge instructions   Complete by:  As directed    Patient will be discharged to home with home health physical and occupational therapy, aide, and social work.  Patient will need to follow up with primary care provider within one week of discharge.  Follow up with pulmonology, Dr. Craige Cotta. Patient should continue medications as prescribed.  Patient should follow a heart healthy/carb modified diet.     Allergies as of 03/21/2018      Reactions   Amoxicillin-pot Clavulanate Diarrhea   Has patient had a PCN reaction causing immediate rash, facial/tongue/throat swelling, SOB or lightheadedness with hypotension: No Has patient had a PCN reaction causing severe rash involving mucus membranes or skin necrosis: No Has patient had a PCN reaction that required hospitalization: No Has patient had a PCN reaction occurring within the last 10 years: No If all of the above answers are "NO", then may proceed with Cephalosporin use.   Neosporin [neomycin-bacitracin Zn-polymyx] Hives      Medication List    TAKE these medications   amLODipine 10 MG tablet Commonly known as:  NORVASC Take 1 tablet (10 mg total) by mouth daily.   atorvastatin 20 MG tablet Commonly known as:  LIPITOR Take 1 tablet (20 mg total) by mouth daily.   clopidogrel 75 MG tablet Commonly known as:  PLAVIX Take 1 tablet (75 mg total) by mouth daily.   diphenhydramine-acetaminophen 25-500 MG Tabs tablet Commonly known as:  TYLENOL PM Take 2 tablets by  mouth at bedtime as needed (sleep).   doxycycline 100 MG tablet Commonly known as:  VIBRA-TABS Take 1 tablet (100 mg total) by mouth every 12 (twelve) hours.   fluticasone 50 MCG/ACT nasal spray Commonly known as:  FLONASE Place 2 sprays into both nostrils daily.   Fluticasone-Salmeterol 250-50 MCG/DOSE Aepb Commonly known as:  ADVAIR Inhale 1 puff into the lungs 2 (two) times daily.   furosemide 40 MG tablet Commonly known as:  LASIX Take 1 tablet (40 mg total) by mouth 2 (two) times daily.   ipratropium-albuterol 0.5-2.5 (3) MG/3ML Soln Commonly known as:  DUONEB Take 3 mLs by nebulization every 4 (four) hours as needed.   lisinopril 2.5 MG tablet Commonly known as:  PRINIVIL,ZESTRIL Take 1 tablet (2.5 mg total) by mouth daily.   pantoprazole 40 MG tablet Commonly known as:  PROTONIX Take 1 tablet (40 mg total) by mouth daily.   potassium chloride 10 MEQ tablet Commonly known as:  K-DUR Take 2 tablets (20 mEq total) by mouth daily.   predniSONE 10 MG tablet Commonly known as:  DELTASONE Take in the morning. Take 4 tablets on day 1. Day 2, take 3 tablets. Day 3, take 2 tablets. Day 4, take 1 tablet.   Saxagliptin-Metformin 5-500 MG Tb24 Take 1 tablet by mouth daily.   SPIRIVA RESPIMAT 1.25 MCG/ACT Aers Generic drug:  Tiotropium Bromide Monohydrate USE ONE INHALATION A DAY   traMADol 50 MG tablet Commonly known as:  ULTRAM TAKE 1 TABLET BY MOUTH TWICE A DAY FOR KNEE PAIN AS NEEDED What changed:  See the new instructions.   TYLENOL 8 HOUR ARTHRITIS PAIN  650 MG CR tablet Generic drug:  acetaminophen Take 1,300 mg by mouth every 8 (eight) hours as needed for pain.      Allergies  Allergen Reactions  . Amoxicillin-Pot Clavulanate Diarrhea    Has patient had a PCN reaction causing immediate rash, facial/tongue/throat swelling, SOB or lightheadedness with hypotension: No Has patient had a PCN reaction causing severe rash involving mucus membranes or skin  necrosis: No Has patient had a PCN reaction that required hospitalization: No Has patient had a PCN reaction occurring within the last 10 years: No If all of the above answers are "NO", then may proceed with Cephalosporin use.   Marland Kitchen Neosporin [Neomycin-Bacitracin Zn-Polymyx] Hives   Follow-up Information    Lyndon Code, MD. Schedule an appointment as soon as possible for a visit in 1 week(s).   Specialty:  Internal Medicine Why:  Hospital follow-up Contact information: [address] [place] [address] [phone]        Coralyn Helling, MD. Schedule an appointment as soon as possible for a visit in 1 week(s).   Specialty:  Pulmonary Disease Why:  Hospital follow up, COPD exacerbation Contact information: [address] [place] [address] [phone]            The results of significant diagnostics from this hospitalization (including imaging, microbiology, ancillary and laboratory) are listed below for reference.    Significant Diagnostic Studies: Dg Chest 2 View  Result Date: 03/19/2018 CLINICAL DATA:  Pt presents with c/o shortness of breath, wheezing and unilateral (right leg) edema, same leg she reports severe achy pain and warmth to touch. Reports hx of DVTs, CHF,and prior hospitalizations related to exacerbation. Family at.*comment was truncated* EXAM: CHEST - 2 VIEW COMPARISON:  None. FINDINGS: Normal cardiac silhouette. Mild central venous pulmonary congestion. No effusion infiltrate pneumothorax. No acute osseous abnormality. IMPRESSION: Mild central venous pulmonary congestion. Electronically Signed   By: Genevive Bi M.D.   On: 03/19/2018 20:51   Dg Chest 2 View  Result Date: 02/22/2018 CLINICAL DATA:  67 year old female with shortness of breath. Lower extremity swelling for 1 week. EXAM: CHEST - 2 VIEW COMPARISON:  09/09/2016 and earlier. FINDINGS: No layering pleural effusion, but there is a small volume of fluid along the right minor fissure which  is new. Mild-to-moderate cardiomegaly appears increased since 11-Apr-2016. Other mediastinal contours are within normal limits. Visualized tracheal air column is within normal limits. No pneumothorax or consolidation. Diffuse pulmonary vascular congestion, more pronounced than on the 2016/04/11 comparison. No acute osseous abnormality identified. Negative visible bowel gas pattern. IMPRESSION: 1. Pulmonary interstitial edema with small volume pleural fluid in the right minor fissure. 2. Mild-to-moderate cardiomegaly may have increased since 2016-04-11. Electronically Signed   By: Odessa Fleming M.D.   On: 02/22/2018 14:45   Ct Angio Chest Pe W/cm &/or Wo Cm  Result Date: 03/20/2018 CLINICAL DATA:  67 y/o F; shortness of breath, wheezing, dyspnea on exertion. PE suspected, high pretest probability. EXAM: CT ANGIOGRAPHY CHEST WITH CONTRAST TECHNIQUE: Multidetector CT imaging of the chest was performed using the standard protocol during bolus administration of intravenous contrast. Multiplanar CT image reconstructions and MIPs were obtained to evaluate the vascular anatomy. CONTRAST:  ISOVUE-370 IOPAMIDOL (ISOVUE-370) INJECTION 76% COMPARISON:  02/23/2018 CTA chest. FINDINGS: Cardiovascular: Stable mild-to-moderate cardiomegaly. No pericardial effusion. Normal caliber thoracic aorta with mild calcific atherosclerosis. Satisfactory opacification of pulmonary arteries. No pulmonary embolus identified. Enlarged main pulmonary artery. Mediastinum/Nodes: 18 mm stable nodule in right lobe of thyroid. Normal esophagus. No  mediastinal adenopathy. Lungs/Pleura: Mild centrilobular emphysema greatest in the upper lobes. Minor atelectasis in the lower lobes bilaterally. No pleural effusion or pneumothorax. Upper Abdomen: No acute abnormality. Musculoskeletal: No chest wall abnormality. No acute or significant osseous findings. Mild multilevel degenerative changes of the spine. Review of the MIP images confirms the above findings. IMPRESSION: 1. No  pulmonary embolus identified. 2. Enlarged main pulmonary artery compatible pulmonary artery hypertension. 3. Mild to moderate cardiomegaly. 4. Calcific atherosclerosis of coronary arteries and aorta. 5. 18 mm nodule in the right lobe of thyroid, thyroid ultrasound is recommended on a nonemergent basis. 6. Minor atelectasis in the lower lobes. Electronically Signed   By: Mitzi Hansen M.D.   On: 03/20/2018 01:22   Ct Angio Chest Pe W Or Wo Contrast  Result Date: 02/23/2018 CLINICAL DATA:  67 y/o F; shortness of breath and back pain with breathing. EXAM: CT ANGIOGRAPHY CHEST WITH CONTRAST TECHNIQUE: Multidetector CT imaging of the chest was performed using the standard protocol during bolus administration of intravenous contrast. Multiplanar CT image reconstructions and MIPs were obtained to evaluate the vascular anatomy. CONTRAST:  ISOVUE-370 IOPAMIDOL (ISOVUE-370) INJECTION 76% COMPARISON:  02/22/2018 chest radiograph FINDINGS: Cardiovascular: Mild-to-moderate cardiomegaly. No pericardial effusion. Normal caliber thoracic aorta with calcific atherosclerosis. Satisfactory opacification of the pulmonary arteries. Mediastinum/Nodes: 18 mm nodule within the right lobe of the thyroid. Normal esophagus. No mediastinal adenopathy. Lungs/Pleura: Mild centrilobular emphysema in the upper lobes. Left-greater-than-right lower lobe consolidations with peribronchial thickening and mucous plugging. No pleural effusion. Upper Abdomen: No acute abnormality. Musculoskeletal: No chest wall abnormality. No acute or significant osseous findings. Review of the MIP images confirms the above findings. IMPRESSION: 1. No pulmonary embolus identified. 2. Left-greater-than-right lower lobe consolidations, likely pneumonia. 3. Mild-to-moderate cardiomegaly and aortic atherosclerosis. 4. Mild upper lobe emphysema. 5. 18 mm nodule in right lobe of thyroid. Thyroid ultrasound is recommended on a nonemergent basis.  Electronically Signed   By: Mitzi Hansen M.D.   On: 02/23/2018 13:43   Dg Chest Port 1 View  Result Date: 03/02/2018 CLINICAL DATA:  Shortness of breath EXAM: PORTABLE CHEST 1 VIEW COMPARISON:  03/01/2018 FINDINGS: Cardiac shadow is mildly enlarged but stable. The lungs are well aerated bilaterally. Mild bibasilar atelectatic changes are seen. No focal infiltrate or sizable effusion is seen. No bony abnormality is noted. IMPRESSION: Mild bibasilar atelectatic changes. Electronically Signed   By: Alcide Clever M.D.   On: 03/02/2018 08:17   Dg Chest Port 1 View  Result Date: 03/01/2018 CLINICAL DATA:  Short of breath EXAM: PORTABLE CHEST 1 VIEW COMPARISON:  02/28/2018 FINDINGS: Bibasilar airspace disease unchanged. Pulmonary vascular congestion unchanged.  Cardiac enlargement. IMPRESSION: No significant interval change. Bibasilar airspace disease possibly due to hypoventilation. Electronically Signed   By: Marlan Palau M.D.   On: 03/01/2018 07:56   Dg Chest Port 1 View  Result Date: 02/28/2018 CLINICAL DATA:  Shortness of breath. EXAM: PORTABLE CHEST 1 VIEW COMPARISON:  One-view chest x-ray 02/26/2018 FINDINGS: The heart size is exaggerated by low lung volumes. Mild pulmonary vascular congestion is present without frank edema. There are no effusions. Bibasilar airspace disease likely reflects atelectasis. IMPRESSION: 1. Continued improvement in aeration at both lung bases with some residual atelectasis, left greater than right. Electronically Signed   By: Marin Roberts M.D.   On: 02/28/2018 07:16   Dg Chest Port 1 View  Result Date: 02/26/2018 CLINICAL DATA:  Shortness of breath and wheezing. EXAM: PORTABLE CHEST 1 VIEW COMPARISON:  Chest x-ray from yesterday. FINDINGS:  Stable cardiomediastinal silhouette. Normal pulmonary vascularity. Persistent low lung volumes with slightly improved aeration the left lung base. No focal consolidation, pleural effusion, or pneumothorax. No acute  osseous abnormality. IMPRESSION: Slightly improved aeration in the left lower lobe. Electronically Signed   By: Obie Dredge M.D.   On: 02/26/2018 08:02   Dg Chest Port 1 View  Result Date: 02/25/2018 CLINICAL DATA:  Shortness of Breath EXAM: PORTABLE CHEST 1 VIEW COMPARISON:  02/24/2018 FINDINGS: Cardiac shadow is prominent but stable. The previously seen vascular congestion has nearly completely resolved. Improved aeration is noted in the left lung base. No bony abnormality is seen. IMPRESSION: Decrease in vascular congestion with improved aeration in the left base. Electronically Signed   By: Alcide Clever M.D.   On: 02/25/2018 08:57   Dg Chest Port 1 View  Result Date: 02/24/2018 CLINICAL DATA:  Shortness of breath EXAM: PORTABLE CHEST 1 VIEW COMPARISON:  Portable exam 0532 hours compared to 02/22/2018 chest radiograph and CT chest 02/23/2018 FINDINGS: Enlargement of cardiac silhouette with pulmonary vascular congestion. Mediastinal contours normal. Mild RIGHT atelectasis versus infiltrate. Increased consolidation of LEFT lower lobe since prior exams. No gross pleural effusion or pneumothorax. IMPRESSION: Enlargement of cardiac silhouette with pulmonary vascular congestion. Increased LEFT lower lobe consolidation with persistent mild atelectasis versus consolidation at RIGHT base. Electronically Signed   By: Ulyses Southward M.D.   On: 02/24/2018 09:01    Microbiology: No results found for this or any previous visit (from the past 240 hour(s)).   Labs: Basic Metabolic Panel: Recent Labs  Lab 03/19/18 2021 03/21/18 0609  NA 146* 143  K 3.5 3.3*  CL 107 104  CO2 28 30  GLUCOSE 106* 129*  BUN 9 12  CREATININE 0.66 0.68  CALCIUM 9.1 8.8*   Liver Function Tests: Recent Labs  Lab 03/19/18 2021  AST 18  ALT 20  ALKPHOS 58  BILITOT 0.2*  PROT 7.0  ALBUMIN 3.5   No results for input(s): LIPASE, AMYLASE in the last 168 hours. No results for input(s): AMMONIA in the last 168  hours. CBC: Recent Labs  Lab 03/19/18 2021  WBC 5.1  NEUTROABS 3.0  HGB 13.9  HCT 45.0  MCV 96.4  PLT 201   Cardiac Enzymes: No results for input(s): CKTOTAL, CKMB, CKMBINDEX, TROPONINI in the last 168 hours. BNP: BNP (last 3 results) Recent Labs    02/22/18 2119 03/19/18 2021  BNP 45.3 29.5    ProBNP (last 3 results) No results for input(s): PROBNP in the last 8760 hours.  CBG: Recent Labs  Lab 03/20/18 1200 03/20/18 1656 03/20/18 2052 03/21/18 0727 03/21/18 1206  GLUCAP 255* 238* 212* 120* 136*       Signed:  Koree Staheli  [facility] 03/21/2018, 12:13 PM

## 2018-03-23 ENCOUNTER — Encounter: Payer: Self-pay | Admitting: Internal Medicine

## 2018-03-23 ENCOUNTER — Ambulatory Visit: Payer: Medicare HMO | Admitting: Internal Medicine

## 2018-03-23 VITALS — BP 126/68 | HR 90 | Resp 16 | Ht 67.0 in | Wt 270.6 lb

## 2018-03-23 DIAGNOSIS — I639 Cerebral infarction, unspecified: Secondary | ICD-10-CM | POA: Diagnosis not present

## 2018-03-23 DIAGNOSIS — E1165 Type 2 diabetes mellitus with hyperglycemia: Secondary | ICD-10-CM

## 2018-03-23 DIAGNOSIS — Z6841 Body Mass Index (BMI) 40.0 and over, adult: Secondary | ICD-10-CM

## 2018-03-23 DIAGNOSIS — J449 Chronic obstructive pulmonary disease, unspecified: Secondary | ICD-10-CM | POA: Diagnosis not present

## 2018-03-23 DIAGNOSIS — E782 Mixed hyperlipidemia: Secondary | ICD-10-CM

## 2018-03-23 LAB — POCT CBG (FASTING - GLUCOSE)-MANUAL ENTRY: Glucose Fasting, POC: 178 mg/dL — AB (ref 70–99)

## 2018-03-23 MED ORDER — SAXAGLIPTIN-METFORMIN ER 5-500 MG PO TB24: 1.0000 | ORAL_TABLET | Freq: Every day | ORAL | 12 refills | Status: DC

## 2018-03-23 MED ORDER — TIOTROPIUM BROMIDE MONOHYDRATE 18 MCG IN CAPS
ORAL_CAPSULE | RESPIRATORY_TRACT | 12 refills | Status: AC
Start: 2018-03-23 — End: ?

## 2018-03-23 NOTE — Progress Notes (Signed)
[facility] [address] [place], [place] [address]  Internal MEDICINE  Office Visit Note  Patient Name: Karen Perez  [id number]  [id number]  Date of Service: 03/23/2018     Chief Complaint  Patient presents with  . COPD  . HYPOXIA     HPI Pt is here for recent hospital follow up. Pt has been in the hospital twice, she is now here with her daughter and they want to look into assisted living. Her blood sugar has been elevated since she has been on prednisone. Breathing is a little better.  Current Medication: Outpatient Encounter Medications as of 03/23/2018  Medication Sig Note  . acetaminophen (TYLENOL 8 HOUR ARTHRITIS PAIN) 650 MG CR tablet Take 1,300 mg by mouth every 8 (eight) hours as needed for pain.   Marland Kitchen amLODipine (NORVASC) 10 MG tablet Take 1 tablet (10 mg total) by mouth daily.   Marland Kitchen atorvastatin (LIPITOR) 20 MG tablet Take 1 tablet (20 mg total) by mouth daily. 03/20/2018: Pt reports being taken off of this medication  03/20/2018 12:35 AM UPDATED information has been added to the PTA medication list.  Please re-address admission med rec navigator. Thank you,   Charise Carwin D   . clopidogrel (PLAVIX) 75 MG tablet Take 1 tablet (75 mg total) by mouth daily. 03/20/2018: Pt not sure of exact time of dose   . Fluticasone-Salmeterol (ADVAIR) 250-50 MCG/DOSE AEPB Inhale 1 puff into the lungs 2 (two) times daily.   . furosemide (LASIX) 40 MG tablet Take 1 tablet (40 mg total) by mouth 2 (two) times daily. 03/20/2018: Pt reports not taking 03/20/2018 12:36 AM UPDATED information has been added to the PTA medication list.  Please re-address admission med rec navigator. Thank you,   Chilton Si, Felicia D   . lisinopril (PRINIVIL,ZESTRIL) 2.5 MG tablet Take 1 tablet (2.5 mg total) by mouth daily. 03/20/2018: Pt reports not taking 03/20/2018 12:34 AM UPDATED information has been added to the PTA medication list.  Please re-address admission med rec navigator. Thank you,   Chilton Si, Felicia D   .  pantoprazole (PROTONIX) 40 MG tablet Take 1 tablet (40 mg total) by mouth daily. 03/20/2018: Pt reports not taking 03/20/2018 12:34 AM UPDATED information has been added to the PTA medication list.  Please re-address admission med rec navigator. Thank you,   Chilton Si, Felicia D   . predniSONE (DELTASONE) 10 MG tablet Take in the morning. Take 4 tablets on day 1. Day 2, take 3 tablets. Day 3, take 2 tablets. Day 4, take 1 tablet.   . traMADol (ULTRAM) 50 MG tablet TAKE 1 TABLET BY MOUTH TWICE A DAY FOR KNEE PAIN AS NEEDED (Patient taking differently: Take 50 mg by mouth once daily) 03/20/2018: Pt reports taking once daily-- ran out, needs refill   . [DISCONTINUED] diphenhydramine-acetaminophen (TYLENOL PM) 25-500 MG TABS tablet Take 2 tablets by mouth at bedtime as needed (sleep).   . [DISCONTINUED] SPIRIVA RESPIMAT 1.25 MCG/ACT AERS USE ONE INHALATION A DAY   . fluticasone (FLONASE) 50 MCG/ACT nasal spray Place 2 sprays into both nostrils daily.   Marland Kitchen ipratropium-albuterol (DUONEB) 0.5-2.5 (3) MG/3ML SOLN Take 3 mLs by nebulization every 4 (four) hours as needed. (Patient not taking: Reported on 03/20/2018) 03/20/2018: Pt reports not taking 03/20/2018 12:35 AM UPDATED information has been added to the PTA medication list.  Please re-address admission med rec navigator. Thank you,   Charise Carwin D   . potassium chloride (K-DUR) 10 MEQ tablet Take 2 tablets (20  mEq total) by mouth daily. (Patient not taking: Reported on 03/20/2018) 03/20/2018: Pt reports not taking 03/20/2018 12:34 AM UPDATED information has been added to the PTA medication list.  Please re-address admission med rec navigator. Thank you,   Charise Carwin D   . Saxagliptin-Metformin 5-500 MG TB24 Take 1 tablet by mouth daily.   Marland Kitchen tiotropium (SPIRIVA HANDIHALER) 18 MCG inhalation capsule Use once day   . [DISCONTINUED] doxycycline (VIBRA-TABS) 100 MG tablet Take 1 tablet (100 mg total) by mouth every 12 (twelve) hours.    No facility-administered encounter  medications on file as of 03/23/2018.     Surgical History: Past Surgical History:  Procedure Laterality Date  . CEREBRAL ANEURYSM REPAIR  2013    Medical History: Past Medical History:  Diagnosis Date  . Arthritis   . Asthma   . Brain aneurysm   . COPD (chronic obstructive pulmonary disease) (HCC)   . Diabetes mellitus without complication (HCC)   . Hypertension   . Stroke [facility]) 2003    Family History: Family History  Problem Relation Age of Onset  . Diabetes Mellitus II Daughter     Social History   Socioeconomic History  . Marital status: Divorced    Spouse name: Not on file  . Number of children: Not on file  . Years of education: Not on file  . Highest education level: Not on file  Occupational History  . Not on file  Social Needs  . Financial resource strain: Not on file  . Food insecurity:    Worry: Not on file    Inability: Not on file  . Transportation needs:    Medical: Not on file    Non-medical: Not on file  Tobacco Use  . Smoking status: Former Smoker    Last attempt to quit: 02/15/2018    Years since quitting: 0.0  . Smokeless tobacco: Never Used  Substance and Sexual Activity  . Alcohol use: No  . Drug use: No  . Sexual activity: Not on file  Lifestyle  . Physical activity:    Days per week: Not on file    Minutes per session: Not on file  . Stress: Not on file  Relationships  . Social connections:    Talks on phone: Not on file    Gets together: Not on file    Attends religious service: Not on file    Active member of club or organization: Not on file    Attends meetings of clubs or organizations: Not on file    Relationship status: Not on file  . Intimate partner violence:    Fear of current or ex partner: Not on file    Emotionally abused: Not on file    Physically abused: Not on file    Forced sexual activity: Not on file  Other Topics Concern  . Not on file  Social History Narrative  . Not on file    Review of Systems   Constitutional: Negative for chills, diaphoresis and fatigue.  HENT: Negative for ear pain, postnasal drip and sinus pressure.   Eyes: Negative for photophobia, discharge, redness, itching and visual disturbance.  Respiratory: Negative for cough, shortness of breath and wheezing.   Cardiovascular: Negative for chest pain, palpitations and leg swelling.  Gastrointestinal: Negative for abdominal pain, constipation, diarrhea, nausea and vomiting.  Genitourinary: Negative for dysuria and flank pain.  Musculoskeletal: Negative for arthralgias, back pain, gait problem and neck pain.  Skin: Negative for color change.  Allergic/Immunologic: Negative for  environmental allergies and food allergies.  Neurological: Negative for dizziness and headaches.  Hematological: Does not bruise/bleed easily.  Psychiatric/Behavioral: Negative for agitation, behavioral problems (depression) and hallucinations.    Vital Signs: BP 126/68 (BP Location: Right Arm, Patient Position: Sitting, Cuff Size: Normal)   Pulse 90   Resp 16   Ht  (1.702 m)   Wt 270 lb 9.6 oz (122.7 kg)   SpO2 98%   BMI 42.38 kg/m    Physical Exam  Constitutional: She is oriented to person, place, and time. She appears well-developed and well-nourished. No distress.  HENT:  Head: Normocephalic and atraumatic.  Mouth/Throat: Oropharynx is clear and moist. No oropharyngeal exudate.  Eyes: Pupils are equal, round, and reactive to light. EOM are normal.  Neck: Normal range of motion. Neck supple. No JVD present. No tracheal deviation present. No thyromegaly present.  Cardiovascular: Normal rate, regular rhythm and normal heart sounds. Exam reveals no gallop and no friction rub.  No murmur heard. Pulmonary/Chest: Effort normal. No respiratory distress. She has no wheezes. She has no rales. She exhibits no tenderness.  Abdominal: Soft. Bowel sounds are normal.  Musculoskeletal: Normal range of motion.  Lymphadenopathy:    She has no  cervical adenopathy.  Neurological: She is alert and oriented to person, place, and time. No cranial nerve deficit.  Skin: Skin is warm and dry. She is not diaphoretic.  Psychiatric: She has a normal mood and affect. Her behavior is normal. Judgment and thought content normal.   Assessment/Plan: 1. COPD with chronic bronchitis and emphysema (HCC) - Continue Advair and spiriva  2. Uncontrolled type 2 diabetes mellitus with hyperglycemia (HCC) - POCT CBG (Fasting - Glucose). Continue meds   3. Morbid obesity with BMI of 40.0-44.9, adult (HCC) - Encouraged and provided diabetic diet   4. Mixed hyperlipidemia - Continue Lipitor   5. Cerebrovascular accident (CVA), unspecified mechanism (HCC) - Continue Palvix   General Counseling: Karen Perez verbalizes understanding of the findings of todays visit and agrees with plan of treatment. I have discussed any further diagnostic evaluation that may be needed or ordered today. We also reviewed her medications today. she has been encouraged to call the office with any questions or concerns that should arise related to todays visit.   Orders Placed This Encounter  Procedures  . POCT CBG (Fasting - Glucose)    I have reviewed all medical records from hospital follow up including radiology reports and consults from other physicians. Appropriate follow up diagnostics will be scheduled as needed. Patient/ Family understands the plan of treatment. Time spent 25 minutes.   Dr Lyndon Code, MD Internal Medicine

## 2018-03-24 ENCOUNTER — Telehealth: Payer: Self-pay

## 2018-03-24 ENCOUNTER — Other Ambulatory Visit: Payer: Self-pay | Admitting: *Deleted

## 2018-03-24 DIAGNOSIS — E119 Type 2 diabetes mellitus without complications: Secondary | ICD-10-CM | POA: Diagnosis not present

## 2018-03-24 DIAGNOSIS — J441 Chronic obstructive pulmonary disease with (acute) exacerbation: Secondary | ICD-10-CM | POA: Diagnosis not present

## 2018-03-24 DIAGNOSIS — M1990 Unspecified osteoarthritis, unspecified site: Secondary | ICD-10-CM | POA: Diagnosis not present

## 2018-03-24 DIAGNOSIS — J9601 Acute respiratory failure with hypoxia: Secondary | ICD-10-CM | POA: Diagnosis not present

## 2018-03-24 DIAGNOSIS — I110 Hypertensive heart disease with heart failure: Secondary | ICD-10-CM | POA: Diagnosis not present

## 2018-03-24 DIAGNOSIS — I5033 Acute on chronic diastolic (congestive) heart failure: Secondary | ICD-10-CM | POA: Diagnosis not present

## 2018-03-24 DIAGNOSIS — F17210 Nicotine dependence, cigarettes, uncomplicated: Secondary | ICD-10-CM | POA: Diagnosis not present

## 2018-03-24 DIAGNOSIS — G4733 Obstructive sleep apnea (adult) (pediatric): Secondary | ICD-10-CM | POA: Diagnosis not present

## 2018-03-24 NOTE — Telephone Encounter (Signed)
Angel from [facility] called for verbal orders for pt for a telemonitor to be place to monitor pt CHF symptoms. I spoke with Dr. Beverely Risen and called her back (lmom) to advise her as per DFK  to contact her cardiologist.

## 2018-03-24 NOTE — Patient Outreach (Signed)
[facility]) Care Management  03/24/2018  Karen Perez Jun 20, 1951 [id number]   Weekly transition of care call placed to member.  She report she is doing much better.  Denies any shortness of breath, however does sound winded as conversation goes on.  State she has her inhaler and nebulizer in the home just in case she does experience shortness of breath.  Denies cough or sputum production.  Denies any urgent concerns, home visit scheduled for next week.  THN CM Care Plan Problem One     Most Recent Value  Care Plan Problem One  Risk for readmission related to COPD management as evidenced by recent hospitalization  Role Documenting the Problem One  Care Management Coordinator  Care Plan for Problem One  Active  [facility] Long Term Goal   Member will not be admitted to hospital within 31 days of discharge  THN Long Term Goal Start Date  03/24/18  Interventions for Problem One Long Term Goal  Discussed with member the importance of following discharge instructions, including follow up appointments, medications, diet, and home health involvement, to decrease the risk of readmission  THN CM Short Term Goal #1   Member will report compliance with medications over the next 4 weeks  THN CM Short Term Goal #1 Start Date  03/24/18  Interventions for Short Term Goal #1  Educatedon importance of taking medications as instructed, especially medications related to COPD.  THN CM Short Term Goal #2   Member will schedule appointment with pulmonologist within the next 4 weeks  THN CM Short Term Goal #2 Start Date  03/24/18  Interventions for Short Term Goal #2  Last appointment was prior to admission, advised to contact office to inquire about follow up appointment post discharge     Kemper Durie, RN, MSN [facility] Care Management  [facility] Manager [phone]

## 2018-03-25 DIAGNOSIS — J9601 Acute respiratory failure with hypoxia: Secondary | ICD-10-CM | POA: Diagnosis not present

## 2018-03-25 DIAGNOSIS — G4733 Obstructive sleep apnea (adult) (pediatric): Secondary | ICD-10-CM | POA: Diagnosis not present

## 2018-03-25 DIAGNOSIS — E119 Type 2 diabetes mellitus without complications: Secondary | ICD-10-CM | POA: Diagnosis not present

## 2018-03-25 DIAGNOSIS — I5033 Acute on chronic diastolic (congestive) heart failure: Secondary | ICD-10-CM | POA: Diagnosis not present

## 2018-03-25 DIAGNOSIS — M1990 Unspecified osteoarthritis, unspecified site: Secondary | ICD-10-CM | POA: Diagnosis not present

## 2018-03-25 DIAGNOSIS — F17210 Nicotine dependence, cigarettes, uncomplicated: Secondary | ICD-10-CM | POA: Diagnosis not present

## 2018-03-25 DIAGNOSIS — J441 Chronic obstructive pulmonary disease with (acute) exacerbation: Secondary | ICD-10-CM | POA: Diagnosis not present

## 2018-03-25 DIAGNOSIS — I110 Hypertensive heart disease with heart failure: Secondary | ICD-10-CM | POA: Diagnosis not present

## 2018-03-29 DIAGNOSIS — J9601 Acute respiratory failure with hypoxia: Secondary | ICD-10-CM | POA: Diagnosis not present

## 2018-03-29 DIAGNOSIS — G4733 Obstructive sleep apnea (adult) (pediatric): Secondary | ICD-10-CM | POA: Diagnosis not present

## 2018-03-29 DIAGNOSIS — J441 Chronic obstructive pulmonary disease with (acute) exacerbation: Secondary | ICD-10-CM | POA: Diagnosis not present

## 2018-03-29 DIAGNOSIS — M1990 Unspecified osteoarthritis, unspecified site: Secondary | ICD-10-CM | POA: Diagnosis not present

## 2018-03-29 DIAGNOSIS — F17210 Nicotine dependence, cigarettes, uncomplicated: Secondary | ICD-10-CM | POA: Diagnosis not present

## 2018-03-29 DIAGNOSIS — E119 Type 2 diabetes mellitus without complications: Secondary | ICD-10-CM | POA: Diagnosis not present

## 2018-03-29 DIAGNOSIS — I110 Hypertensive heart disease with heart failure: Secondary | ICD-10-CM | POA: Diagnosis not present

## 2018-03-29 DIAGNOSIS — I5033 Acute on chronic diastolic (congestive) heart failure: Secondary | ICD-10-CM | POA: Diagnosis not present

## 2018-03-30 ENCOUNTER — Ambulatory Visit: Payer: Medicare HMO | Admitting: *Deleted

## 2018-03-31 ENCOUNTER — Other Ambulatory Visit: Payer: Self-pay | Admitting: *Deleted

## 2018-03-31 DIAGNOSIS — I5033 Acute on chronic diastolic (congestive) heart failure: Secondary | ICD-10-CM | POA: Diagnosis not present

## 2018-03-31 DIAGNOSIS — F17210 Nicotine dependence, cigarettes, uncomplicated: Secondary | ICD-10-CM | POA: Diagnosis not present

## 2018-03-31 DIAGNOSIS — M1990 Unspecified osteoarthritis, unspecified site: Secondary | ICD-10-CM | POA: Diagnosis not present

## 2018-03-31 DIAGNOSIS — J441 Chronic obstructive pulmonary disease with (acute) exacerbation: Secondary | ICD-10-CM | POA: Diagnosis not present

## 2018-03-31 DIAGNOSIS — E119 Type 2 diabetes mellitus without complications: Secondary | ICD-10-CM | POA: Diagnosis not present

## 2018-03-31 DIAGNOSIS — J9601 Acute respiratory failure with hypoxia: Secondary | ICD-10-CM | POA: Diagnosis not present

## 2018-03-31 DIAGNOSIS — G4733 Obstructive sleep apnea (adult) (pediatric): Secondary | ICD-10-CM | POA: Diagnosis not present

## 2018-03-31 DIAGNOSIS — I110 Hypertensive heart disease with heart failure: Secondary | ICD-10-CM | POA: Diagnosis not present

## 2018-03-31 NOTE — Patient Outreach (Signed)
[facility]) Care Management  03/31/2018  Karen Perez 12/07/1950 [id number]   Weekly transition of care call placed to member.  She report she is doing well, denies shortness of breath but noted to be as conversation progressed.  She state she is following plan of care regarding COPD management, denies productive cough or fever.  This care manager inquires about availability of member next week for home visit that she initially agreed to.  She state she think she has a dentist appointment, unsure if she will be able to keep scheduled visit.  Request for this care manager to call before proceeding with visit.  Will follow up next week.   Update @ 1500:  Notified by care management assistant that [name] with [facility] is requesting call regarding member.  Call placed to [facility], no answer, voice message left.  Will await call back.  THN CM Care Plan Problem One     Most Recent Value  Care Plan Problem One  Risk for readmission related to COPD management as evidenced by recent hospitalization  Role Documenting the Problem One  Care Management Coordinator  Care Plan for Problem One  Active  [facility] Long Term Goal   Member will not be admitted to hospital within 31 days of discharge  THN Long Term Goal Start Date  03/24/18  Interventions for Problem One Long Term Goal  Reviewed COPD action plan with member  THN CM Short Term Goal #1   Member will report compliance with medications over the next 4 weeks  [facility] CM Short Term Goal #1 Start Date  03/24/18  Interventions for Short Term Goal #1  Medication list reviewed per chart, member advised on importance of taking as prescribed.  THN CM Short Term Goal #2   Member will schedule appointment with pulmonologist within the next 4 weeks  THN CM Short Term Goal #2 Start Date  03/24/18  Interventions for Short Term Goal #2  Re-educated on importance of close follow up with pulmonologist in effort to effectively manage COPD and  decrease risk of admission     Kemper Durie, RN, MSN [facility] Care Management  [facility] Care Manager [phone]

## 2018-04-01 ENCOUNTER — Telehealth: Payer: Self-pay

## 2018-04-01 DIAGNOSIS — M1990 Unspecified osteoarthritis, unspecified site: Secondary | ICD-10-CM | POA: Diagnosis not present

## 2018-04-01 DIAGNOSIS — E119 Type 2 diabetes mellitus without complications: Secondary | ICD-10-CM | POA: Diagnosis not present

## 2018-04-01 DIAGNOSIS — J9601 Acute respiratory failure with hypoxia: Secondary | ICD-10-CM | POA: Diagnosis not present

## 2018-04-01 DIAGNOSIS — I5033 Acute on chronic diastolic (congestive) heart failure: Secondary | ICD-10-CM | POA: Diagnosis not present

## 2018-04-01 DIAGNOSIS — F17210 Nicotine dependence, cigarettes, uncomplicated: Secondary | ICD-10-CM | POA: Diagnosis not present

## 2018-04-01 DIAGNOSIS — G4733 Obstructive sleep apnea (adult) (pediatric): Secondary | ICD-10-CM | POA: Diagnosis not present

## 2018-04-01 DIAGNOSIS — I110 Hypertensive heart disease with heart failure: Secondary | ICD-10-CM | POA: Diagnosis not present

## 2018-04-01 DIAGNOSIS — J441 Chronic obstructive pulmonary disease with (acute) exacerbation: Secondary | ICD-10-CM | POA: Diagnosis not present

## 2018-04-01 NOTE — Telephone Encounter (Signed)
-----   Message from [facility] sent at 03/30/2018  8:48 AM EDT ----- We do not have any paperwork  ----- Message ----- From: Loura Back, CMA Sent: 03/28/2018  12:24 PM To: Lyndon Code, MD, Titania Sindy Messing, #  [facility]K is looking for paperwork that daughter dropped off.  If you have it can you please place on DFK's desk. dbs

## 2018-04-01 NOTE — Telephone Encounter (Signed)
Left vm for daughter or Karen Perez that we can't find the paperwork and I asked if they could drop off another copy and put attn to [facility].  dbs

## 2018-04-04 ENCOUNTER — Ambulatory Visit: Payer: Self-pay | Admitting: *Deleted

## 2018-04-04 ENCOUNTER — Other Ambulatory Visit: Payer: Self-pay | Admitting: *Deleted

## 2018-04-04 DIAGNOSIS — H26492 Other secondary cataract, left eye: Secondary | ICD-10-CM | POA: Diagnosis not present

## 2018-04-04 DIAGNOSIS — E119 Type 2 diabetes mellitus without complications: Secondary | ICD-10-CM | POA: Diagnosis not present

## 2018-04-04 DIAGNOSIS — Z961 Presence of intraocular lens: Secondary | ICD-10-CM | POA: Diagnosis not present

## 2018-04-04 NOTE — Patient Outreach (Signed)
[facility]) Care Management  04/04/2018  Karen Perez August 12, 1951 [id number]   Call placed to member prior to time of scheduled home visit as requested, no answer.  HIPAA compliant voice message left.  Will await call back before proceeding with visit.  If no call back, will follow up within the next 2 business days.   Call placed to [name], Child psychotherapist with [facility], no answer.  Voice message left, will await call back.  Kemper Durie, [place], MSN [facility] Care Management  [facility] Manager [phone]

## 2018-04-05 DIAGNOSIS — J441 Chronic obstructive pulmonary disease with (acute) exacerbation: Secondary | ICD-10-CM | POA: Diagnosis not present

## 2018-04-05 DIAGNOSIS — F17210 Nicotine dependence, cigarettes, uncomplicated: Secondary | ICD-10-CM | POA: Diagnosis not present

## 2018-04-05 DIAGNOSIS — M1990 Unspecified osteoarthritis, unspecified site: Secondary | ICD-10-CM | POA: Diagnosis not present

## 2018-04-05 DIAGNOSIS — I110 Hypertensive heart disease with heart failure: Secondary | ICD-10-CM | POA: Diagnosis not present

## 2018-04-05 DIAGNOSIS — I5033 Acute on chronic diastolic (congestive) heart failure: Secondary | ICD-10-CM | POA: Diagnosis not present

## 2018-04-05 DIAGNOSIS — G4733 Obstructive sleep apnea (adult) (pediatric): Secondary | ICD-10-CM | POA: Diagnosis not present

## 2018-04-05 DIAGNOSIS — E119 Type 2 diabetes mellitus without complications: Secondary | ICD-10-CM | POA: Diagnosis not present

## 2018-04-05 DIAGNOSIS — J9601 Acute respiratory failure with hypoxia: Secondary | ICD-10-CM | POA: Diagnosis not present

## 2018-04-06 DIAGNOSIS — E119 Type 2 diabetes mellitus without complications: Secondary | ICD-10-CM | POA: Diagnosis not present

## 2018-04-06 DIAGNOSIS — I110 Hypertensive heart disease with heart failure: Secondary | ICD-10-CM | POA: Diagnosis not present

## 2018-04-06 DIAGNOSIS — G4733 Obstructive sleep apnea (adult) (pediatric): Secondary | ICD-10-CM | POA: Diagnosis not present

## 2018-04-06 DIAGNOSIS — I5033 Acute on chronic diastolic (congestive) heart failure: Secondary | ICD-10-CM | POA: Diagnosis not present

## 2018-04-06 DIAGNOSIS — J9601 Acute respiratory failure with hypoxia: Secondary | ICD-10-CM | POA: Diagnosis not present

## 2018-04-06 DIAGNOSIS — F17210 Nicotine dependence, cigarettes, uncomplicated: Secondary | ICD-10-CM | POA: Diagnosis not present

## 2018-04-06 DIAGNOSIS — M1990 Unspecified osteoarthritis, unspecified site: Secondary | ICD-10-CM | POA: Diagnosis not present

## 2018-04-06 DIAGNOSIS — J441 Chronic obstructive pulmonary disease with (acute) exacerbation: Secondary | ICD-10-CM | POA: Diagnosis not present

## 2018-04-07 ENCOUNTER — Telehealth: Payer: Self-pay

## 2018-04-07 ENCOUNTER — Telehealth: Payer: Self-pay | Admitting: Internal Medicine

## 2018-04-07 NOTE — Telephone Encounter (Signed)
Patient daughter Inetta Fermo) dropped off paperwork for patient. I put in Dr Welton Flakes folder to review and sign. Beth

## 2018-04-07 NOTE — Telephone Encounter (Signed)
Called patient's daughter and let her know that [facility] paperwork is ready to pick up

## 2018-04-08 ENCOUNTER — Other Ambulatory Visit: Payer: Self-pay | Admitting: *Deleted

## 2018-04-08 DIAGNOSIS — J9601 Acute respiratory failure with hypoxia: Secondary | ICD-10-CM | POA: Diagnosis not present

## 2018-04-08 DIAGNOSIS — I5033 Acute on chronic diastolic (congestive) heart failure: Secondary | ICD-10-CM | POA: Diagnosis not present

## 2018-04-08 DIAGNOSIS — J441 Chronic obstructive pulmonary disease with (acute) exacerbation: Secondary | ICD-10-CM | POA: Diagnosis not present

## 2018-04-08 DIAGNOSIS — E119 Type 2 diabetes mellitus without complications: Secondary | ICD-10-CM | POA: Diagnosis not present

## 2018-04-08 DIAGNOSIS — G4733 Obstructive sleep apnea (adult) (pediatric): Secondary | ICD-10-CM | POA: Diagnosis not present

## 2018-04-08 DIAGNOSIS — F17210 Nicotine dependence, cigarettes, uncomplicated: Secondary | ICD-10-CM | POA: Diagnosis not present

## 2018-04-08 DIAGNOSIS — I110 Hypertensive heart disease with heart failure: Secondary | ICD-10-CM | POA: Diagnosis not present

## 2018-04-08 DIAGNOSIS — M1990 Unspecified osteoarthritis, unspecified site: Secondary | ICD-10-CM | POA: Diagnosis not present

## 2018-04-08 NOTE — Patient Outreach (Signed)
[facility]) Care Management  04/08/2018  Karen Perez 15-Mar-1951 [id number]   Call placed back to member to follow up on missed home visit earlier this week.  She report she is doing well, denies shortness of breath.  This care manager discussed the use of inhalers and nebulizer, state she is using inhalers but does not have nebulizer.  She is advised to discuss with pulmonologist.  Attempted to schedule home visit next week, however she report the time this care manager is available is too early.  She agrees to home visit within the next 2 weeks.  Will follow up next week telephonically.  THN CM Care Plan Problem One     Most Recent Value  Care Plan Problem One  Risk for readmission related to COPD management as evidenced by recent hospitalization  Role Documenting the Problem One  Care Management [place] for Problem One  Active  [facility] Long Term Goal   Member will not be admitted to hospital within 31 days of discharge  [place] Term Goal Start Date  03/24/18  Interventions for Problem One Long Term Goal  Support system discussed with member as strong support system would decrease risk for readmission.  Referral placed to LCSW to further discuss as well as discussing potential ALF  THN CM Short Term Goal #1   Member will report compliance with medications over the next 4 weeks  THN CM Short Term Goal #1 Start Date  03/24/18  [facility] CM Short Term Goal #1 Met Date  04/08/18  [facility] CM Short Term Goal #2   Member will schedule appointment with pulmonologist within the next 4 weeks  THN CM Short Term Goal #2 Start Date  03/24/18  Interventions for Short Term Goal #2  Strongly advised member to contact pulmonology office in effort to have them effectively manage COPD     Valente David, RN, MSN [place] Manager [phone]

## 2018-04-12 ENCOUNTER — Other Ambulatory Visit: Payer: Self-pay | Admitting: *Deleted

## 2018-04-12 ENCOUNTER — Encounter: Payer: Self-pay | Admitting: *Deleted

## 2018-04-12 ENCOUNTER — Telehealth: Payer: Self-pay

## 2018-04-12 DIAGNOSIS — G4733 Obstructive sleep apnea (adult) (pediatric): Secondary | ICD-10-CM | POA: Diagnosis not present

## 2018-04-12 DIAGNOSIS — I110 Hypertensive heart disease with heart failure: Secondary | ICD-10-CM | POA: Diagnosis not present

## 2018-04-12 DIAGNOSIS — E119 Type 2 diabetes mellitus without complications: Secondary | ICD-10-CM | POA: Diagnosis not present

## 2018-04-12 DIAGNOSIS — M1990 Unspecified osteoarthritis, unspecified site: Secondary | ICD-10-CM | POA: Diagnosis not present

## 2018-04-12 DIAGNOSIS — F17210 Nicotine dependence, cigarettes, uncomplicated: Secondary | ICD-10-CM | POA: Diagnosis not present

## 2018-04-12 DIAGNOSIS — J9601 Acute respiratory failure with hypoxia: Secondary | ICD-10-CM | POA: Diagnosis not present

## 2018-04-12 DIAGNOSIS — I5033 Acute on chronic diastolic (congestive) heart failure: Secondary | ICD-10-CM | POA: Diagnosis not present

## 2018-04-12 DIAGNOSIS — J441 Chronic obstructive pulmonary disease with (acute) exacerbation: Secondary | ICD-10-CM | POA: Diagnosis not present

## 2018-04-12 NOTE — Telephone Encounter (Signed)
Lilia Pro, social worker from [facility], called in regards to this pt. Pt family thinks he may have depression so Arline Asp called to get a verbal order to do a screening. I spoke with Nimisha and she said it was okay to give the order.   I called Cindy at [phone] to giver her the verbal order for what is stated above.

## 2018-04-12 NOTE — Patient Outreach (Signed)
[facility]) Care Management  04/12/2018  Karen Perez [birth year] [id number]  CSW was able to make initial contact with patient today to perform phone assessment, as well as assess and assist with social work needs and services.  CSW introduced self, explained role and types of services provided through [facility] Care Management [facility] Care Management).  CSW further explained to patient that CSW works with patient's RNCM, also with [facility] Care Management, Kemper Durie. CSW then explained the reason for the call, indicating that Mrs. Maurice March thought that patient would benefit from social work services and resources to assist with long-term care placement arrangements in an assisted living facility.  CSW obtained two HIPAA compliant identifiers from patient, which included patient's name and date of birth. Patient admitted that she is "finally ready" to pursue assisted living placement, realizing that she is no longer able to care for herself independently in the home.  Patient currently lives at home with her daughter, Karen Perez, but reports that her daughter is no longer able to assist her with activities of daily living, meal preparation, laundry, grocery shopping, etc.  Patient further reported that her home health social worker recommended that patient be placed in a higher level of care.  CSW requested to have the name and contact number of patient's home health social worker in an attempt to try an coordinate efforts and to prevent duplication of services; however, patient was unable to provide this information to CSW. CSW agreed to meet with patient for an initial home visit to assist with the placement process.  Per patient's request, the initial home visit has been scheduled for Tuesday, April 19, 2018 at 11:00AM.  During the initial home visit, CSW will complete an FL-2 Form with patient, as well as provide patient with a list of assisted living facilities in the [facility] area.  CSW then agreed to submit the FL-2 Form to patient's Primary Care Physician, Dr. Beverely Risen for review and signature.  In the meantime, patient agreed to contact Dr. Welton Flakes to schedule a follow-up appointment to have a TB Skin Test administered, necessary for placement in an assisted living facility.  Patient is aware that Dr. Welton Flakes may also need to meet with patient to sign off on the FL-2 Form.  Patient also agreed to tour assisted living facilities of interest to report to CSW during the initial home visit.  Once the FL-2 Form is obtained, CSW will fax the form to all assisted living facilities of interest and report bed offers to patient.   THN CM Care Plan Problem One     Most Recent Value  Care Plan Problem One  Lacks caregiver support to assist with activities of daily living.  Role Documenting the Problem One  Clinical Social Worker  Care Plan for Problem One  Active  [facility] Long Term Goal   Patient will receive 24 hour care and supervision in an assisted living facility within the next 45 days.  THN Long Term Goal Start Date  04/12/18  Interventions for Problem One Long Term Goal  CSW will meet with patient for an initial home visit within the next week to assess and assist with long-term care placement arrangements in an assisted living facility.  THN CM Short Term Goal #1   Patient will review list of assisted living facilities provided to her by CSW and decide on several facilities of choice, within the next two weeks.  THN CM Short Term Goal #1 Start  Date  04/12/18  Interventions for Short Term Goal #1  CSW has provided patient with a list of assisted living facilities within [facility], encouraging patient to tour facilities and decide on at least three assisted living facilities of choice.  THN CM Short Term Goal #2   CSW and patient will contact patient's primary care physician to request a completed and signed FL-2 Form, necessary for assisted living placement, within the  next two weeks.  THN CM Short Term Goal #2 Start Date  04/12/18  Interventions for Short Term Goal #2  CSW will complete an FL-2 Form on patient and submit to patient's primary care physician for review and signature.  Once completed and obtained from primary care physician, CSW will fax the FL-2 Form to all assisted living facilities of choice.  THN CM Short Term Goal #3  Patient will contact her primary care physician to schedule a routine visit to have FL-2 Form initiated, as well as a TB Skin Test, both necessary for assisted living placement, within the next two weeks.  THN CM Short Term Goal #3 Start Date  04/12/18  Interventions for Short Tern Goal #3  CSW will contact patient's primary care physician to inform them that CSW will be initiating long-term care placement for patient in an assisted living facility, requesting that a TB skin Test be administered, along with completion and signature on an FL-2 Form, all necessary for placement purposes.    [name], BSW, MSW, LCSW  Licensed Restaurant manager, [employer] [facility]  Mailing [place] [address] [place], [place] [address] Physical Address-[address], [place], [place] [address] Toll Free Main # [phone] Fax # [phone] Cell # [phone]  Office # [phone] [name]

## 2018-04-13 ENCOUNTER — Telehealth: Payer: Self-pay | Admitting: Pulmonary Disease

## 2018-04-13 DIAGNOSIS — G4733 Obstructive sleep apnea (adult) (pediatric): Secondary | ICD-10-CM | POA: Diagnosis not present

## 2018-04-13 DIAGNOSIS — E119 Type 2 diabetes mellitus without complications: Secondary | ICD-10-CM | POA: Diagnosis not present

## 2018-04-13 DIAGNOSIS — I110 Hypertensive heart disease with heart failure: Secondary | ICD-10-CM | POA: Diagnosis not present

## 2018-04-13 DIAGNOSIS — J9601 Acute respiratory failure with hypoxia: Secondary | ICD-10-CM | POA: Diagnosis not present

## 2018-04-13 DIAGNOSIS — F17210 Nicotine dependence, cigarettes, uncomplicated: Secondary | ICD-10-CM | POA: Diagnosis not present

## 2018-04-13 DIAGNOSIS — I5033 Acute on chronic diastolic (congestive) heart failure: Secondary | ICD-10-CM | POA: Diagnosis not present

## 2018-04-13 DIAGNOSIS — M1990 Unspecified osteoarthritis, unspecified site: Secondary | ICD-10-CM | POA: Diagnosis not present

## 2018-04-13 DIAGNOSIS — J441 Chronic obstructive pulmonary disease with (acute) exacerbation: Secondary | ICD-10-CM | POA: Diagnosis not present

## 2018-04-13 NOTE — Telephone Encounter (Signed)
Called and spoke to [place] with [facility]. She was in the home with the patient. Per note on 03/18/18 for OSA patient is to continue CPAP.  Patient reports she does not have a CPAP and hasn't for 3 years.   Consider sleep study?  VS please advise. Thank you!

## 2018-04-13 NOTE — Telephone Encounter (Signed)
Okay to place order for home sleep study. 

## 2018-04-14 ENCOUNTER — Ambulatory Visit: Payer: Self-pay | Admitting: *Deleted

## 2018-04-14 ENCOUNTER — Telehealth: Payer: Self-pay | Admitting: Pulmonary Disease

## 2018-04-14 NOTE — Telephone Encounter (Signed)
Called and spoke to patient. Let patient know that the order for the HST was placed but that it may take several weeks before she is able to do the sleep study due to waiting list. Patient verbalized understanding and thanked staff for the call. Nothing further is needed at this time.

## 2018-04-14 NOTE — Telephone Encounter (Signed)
Spoke with Apolonio Schneiders. She stated that the she had faxed over a "COPD kit" for the patient for VS to sign. Looked in VS' look-at folder and did not see this form. She stated that she had faxed twice (once last night and once this morning). Provided her with the fax number for triage. She will fax it again.   Will leave this message open until the form has been received.

## 2018-04-15 ENCOUNTER — Other Ambulatory Visit: Payer: Self-pay | Admitting: *Deleted

## 2018-04-15 ENCOUNTER — Telehealth: Payer: Self-pay

## 2018-04-15 NOTE — Patient Outreach (Signed)
[facility]) Care Management  04/15/2018  Karen Perez 04/23/1951 [id number]   Voice message received from social worker with [facility], Karen Perez, regarding potential referral to PACE.  Call placed back to [facility], she will place referral.  She is aware that this care manager has also discussed increased level of care for member with Karen Perez, also social worker from [facility].  There is confusion as to whether member actually would like placement versus PACE.  She is aware that [facility] LCSW has visit scheduled for next week and will discuss further.  Weekly transition of care call placed to member.  She report she is doing well, denies shortness of breath although she sounds winded during conversation.  Denies pain or discomfort, report compliance with medications.  She was unaware of appointment with pulmonologist next week, unsure if she will have transportation.  LCSW made aware, member advised to attempt to secure transportation as this appointment is imperative for her progression. She verbalizes understanding.  She is reminded of home visit with LCSW next week as well as home visit with this care manager.  Denies concerns.  THN CM Care Plan Problem One     Most Recent Value  Care Plan Problem One  Risk for readmission related to COPD management as evidenced by recent hospitalization  Role Documenting the Problem One  Care Management Coordinator  Care Plan for Problem One  Active  [facility] Long Term Goal   Member will not be admitted to hospital within 31 days of discharge  THN Long Term Goal Start Date  03/24/18  Interventions for Problem One Long Term Goal  Collaborated with LCSW & social worker from [facility] in effort to develop plan of care to increase support to decreaese risk for readmission  [facility] CM Short Term Goal #2   Member will schedule appointment with pulmonologist within the next 4 weeks  [facility]N CM Short Term Goal #2 Start Date  03/24/18  Interventions for  Short Term Goal #2  Reminded of appointment scheduled with pulmonologist scheduled for Monday, 6/3.  Advised to secure transportation.      Kemper Durie, [place], MSN [facility] Care Management  [facility] Manager [phone]

## 2018-04-15 NOTE — Telephone Encounter (Signed)
Called Karen Perez to follow up on COPD kit being faxed.  Fax received from [place] and placed in VS box for him to sign.  Will route to [facility] to follow up on.

## 2018-04-17 ENCOUNTER — Encounter: Payer: Self-pay | Admitting: *Deleted

## 2018-04-18 ENCOUNTER — Encounter: Payer: Self-pay | Admitting: Pulmonary Disease

## 2018-04-18 ENCOUNTER — Ambulatory Visit: Payer: Medicare HMO | Admitting: Pulmonary Disease

## 2018-04-18 VITALS — BP 116/74 | HR 83 | Ht 67.0 in | Wt 275.0 lb

## 2018-04-18 DIAGNOSIS — J449 Chronic obstructive pulmonary disease, unspecified: Secondary | ICD-10-CM | POA: Diagnosis not present

## 2018-04-18 DIAGNOSIS — G4733 Obstructive sleep apnea (adult) (pediatric): Secondary | ICD-10-CM

## 2018-04-18 NOTE — Progress Notes (Signed)
[facility]  Chief Complaint  Patient presents with  . Follow-up    Pt has increase of wheezing, SOB with exertion more out in the hot heat. Pt has not used the cpap machine in over 4 weeks, and had to send it back.    Vital signs: BP 116/74 (BP Location: Left Arm, Cuff Size: Normal)   Pulse 83   Ht 5\' 7"  (1.702 m)   Wt 275 lb (124.7 kg)   SpO2 93%   BMI 43.07 kg/m   History of Present Illness: Karen Perez is a 67 y.o. female former smoker with COPD, sleep apnea, VCD.  She was in hospital last month with COPD exacerbation.  She thinks this is from weather change and pollen.  Better now.  Not having cough, wheeze, sputum, chest pain, swelling, fever, hemoptysis.  She hasn't used CPAP since she was d/c from nursing home few years ago.  She has some snoring.  She can get sleepy during the day.  Physical Exam:  General - pleasant Eyes - pupils reactive ENT - no sinus tenderness, no oral exudate, no LAN, raspy voice, MP 4 Cardiac - regular, no murmur Chest - no wheeze, rales Abd - soft, non tender Ext - no edema Skin - no rashes Neuro - walks with walker Psych - normal mood  Assessment/Plan:  COPD with chronic bronchitis and emphysema. - continue spiriva, advair, and prn duoneb  Obstructive sleep apnea. - she has not been on CPAP for past several years - she still has snoring and daytime sleepiness - she has hx of HTN, CVA, and DM - will arrange for home sleep study to further assess status of sleep apnea and determine if she needs to resume CPAP  Obesity. - discussed importance of weight loss   Patient Instructions  Will call with results of sleep study  Follow up 4 months    Coralyn HellingVineet Ajai Harville, MD [facility] 04/18/2018, 11:58 AM  Flow Sheet  Pulmonary tests: PFT 08/01/09 >> FEV1 1.21 (51%), FEV1% 70, TLC 3.48 (68%), DLCO 42% CT angio chest 02/23/18 >> centrilobular emphysema  Sleep tests: PSG  04/14/04 > RDI 20  Cardiac tests: Echo 02/23/18 >> EF 65 to 70%, grade 1 DD  Past Medical History: She  has a past medical history of Arthritis, Asthma, Brain aneurysm, COPD (chronic obstructive pulmonary disease) (HCC), Diabetes mellitus without complication (HCC), Hypertension, and Stroke (HCC) (2003).  Past Surgical History: She  has a past surgical history that includes Cerebral aneurysm repair (2013).  Family History: Her family history includes Diabetes Mellitus II in her daughter.  Social History: She  reports that she quit smoking about 2 months ago. She has never used smokeless tobacco. She reports that she does not drink alcohol or use drugs.  Medications: Allergies as of 04/18/2018      Reactions   Amoxicillin-pot Clavulanate Diarrhea   Has patient had a PCN reaction causing immediate rash, facial/tongue/throat swelling, SOB or lightheadedness with hypotension: No Has patient had a PCN reaction causing severe rash involving mucus membranes or skin necrosis: No Has patient had a PCN reaction that required hospitalization: No Has patient had a PCN reaction occurring within the last 10 years: No If all of the above answers are "NO", then may proceed with Cephalosporin use.   Neosporin [neomycin-bacitracin Zn-polymyx] Hives      Medication List        Accurate as of 04/18/18 11:58 AM. Always use your most recent  med list.          amLODipine 10 MG tablet Commonly known as:  NORVASC Take 1 tablet (10 mg total) by mouth daily.   atorvastatin 20 MG tablet Commonly known as:  LIPITOR Take 1 tablet (20 mg total) by mouth daily.   clopidogrel 75 MG tablet Commonly known as:  PLAVIX Take 1 tablet (75 mg total) by mouth daily.   fluticasone 50 MCG/ACT nasal spray Commonly known as:  FLONASE Place 2 sprays into both nostrils daily.   Fluticasone-Salmeterol 250-50 MCG/DOSE Aepb Commonly known as:  ADVAIR Inhale 1 puff into the lungs 2 (two) times daily.   furosemide 40  MG tablet Commonly known as:  LASIX Take 1 tablet (40 mg total) by mouth 2 (two) times daily.   ipratropium-albuterol 0.5-2.5 (3) MG/3ML Soln Commonly known as:  DUONEB Take 3 mLs by nebulization every 4 (four) hours as needed.   lisinopril 2.5 MG tablet Commonly known as:  PRINIVIL,ZESTRIL Take 1 tablet (2.5 mg total) by mouth daily.   pantoprazole 40 MG tablet Commonly known as:  PROTONIX Take 1 tablet (40 mg total) by mouth daily.   potassium chloride 10 MEQ tablet Commonly known as:  K-DUR Take 2 tablets (20 mEq total) by mouth daily.   Saxagliptin-Metformin 5-500 MG Tb24 Take 1 tablet by mouth daily. Take one tab a day for dm   tiotropium 18 MCG inhalation capsule Commonly known as:  SPIRIVA HANDIHALER Use once day   traMADol 50 MG tablet Commonly known as:  ULTRAM TAKE 1 TABLET BY MOUTH TWICE A DAY FOR KNEE PAIN AS NEEDED   TYLENOL 8 HOUR ARTHRITIS PAIN 650 MG CR tablet Generic drug:  acetaminophen Take 1,300 mg by mouth every 8 (eight) hours as needed for pain.

## 2018-04-18 NOTE — Telephone Encounter (Signed)
Sent message to [facility] about COPD kit form needing to be signed by VS.

## 2018-04-18 NOTE — Patient Instructions (Signed)
Will call with results of sleep study  Follow up 4 months

## 2018-04-19 ENCOUNTER — Encounter: Payer: Self-pay | Admitting: *Deleted

## 2018-04-19 ENCOUNTER — Other Ambulatory Visit: Payer: Self-pay | Admitting: *Deleted

## 2018-04-19 DIAGNOSIS — G4733 Obstructive sleep apnea (adult) (pediatric): Secondary | ICD-10-CM | POA: Diagnosis not present

## 2018-04-19 DIAGNOSIS — I5033 Acute on chronic diastolic (congestive) heart failure: Secondary | ICD-10-CM | POA: Diagnosis not present

## 2018-04-19 DIAGNOSIS — E119 Type 2 diabetes mellitus without complications: Secondary | ICD-10-CM | POA: Diagnosis not present

## 2018-04-19 DIAGNOSIS — I110 Hypertensive heart disease with heart failure: Secondary | ICD-10-CM | POA: Diagnosis not present

## 2018-04-19 DIAGNOSIS — J9601 Acute respiratory failure with hypoxia: Secondary | ICD-10-CM | POA: Diagnosis not present

## 2018-04-19 DIAGNOSIS — M1990 Unspecified osteoarthritis, unspecified site: Secondary | ICD-10-CM | POA: Diagnosis not present

## 2018-04-19 DIAGNOSIS — F17210 Nicotine dependence, cigarettes, uncomplicated: Secondary | ICD-10-CM | POA: Diagnosis not present

## 2018-04-19 DIAGNOSIS — J441 Chronic obstructive pulmonary disease with (acute) exacerbation: Secondary | ICD-10-CM | POA: Diagnosis not present

## 2018-04-19 NOTE — Telephone Encounter (Signed)
Apolonio Schneiders at [facility] calling to follow up on COPD kit for Karen Perez 02/25/1951 signed and faxed back.   VS Please advise

## 2018-04-19 NOTE — Telephone Encounter (Signed)
Another message on this and sheet is in his folder. Will close this encounter.

## 2018-04-19 NOTE — Telephone Encounter (Signed)
I have no idea what this is, and haven't seen anything related to this.

## 2018-04-19 NOTE — Patient Outreach (Signed)
[facility]) Care Management   04/19/2018  Karen Perez 1951-11-15 [id number]  Karen Perez is an 67 y.o. female  Subjective:   Member alert and oriented x3, denies pain or discomfort.  She clearly sound winded with activity, audible wheezing without stethoscope, however she denies shortness of breath, state she feel she is breathing "very well."  Oxygen saturations with activity drop to 88%, increase to 93% at the highest after rest.  Objective:   Review of Systems  Constitutional: Negative.   HENT: Negative.   Eyes: Negative.   Respiratory: Positive for shortness of breath and wheezing.   Cardiovascular: Positive for leg swelling.  Gastrointestinal: Negative.   Genitourinary: Negative.   Musculoskeletal: Negative.   Skin: Negative.   Neurological: Negative.   Endo/Heme/Allergies: Negative.   Psychiatric/Behavioral: Negative.     Physical Exam  Constitutional: She is oriented to person, place, and time. She appears well-developed and well-nourished.  Neck: Normal range of motion.  Musculoskeletal: Normal range of motion.  Neurological: She is alert and oriented to person, place, and time.  Skin: Skin is warm and dry.   BP 125/75   Pulse 74   SpO2 92%   Encounter Medications:   Outpatient Encounter Medications as of 04/19/2018  Medication Sig Note  . acetaminophen (TYLENOL 8 HOUR ARTHRITIS PAIN) 650 MG CR tablet Take 1,300 mg by mouth every 8 (eight) hours as needed for pain.   Marland Kitchen amLODipine (NORVASC) 10 MG tablet Take 1 tablet (10 mg total) by mouth daily.   . clopidogrel (PLAVIX) 75 MG tablet Take 1 tablet (75 mg total) by mouth daily. 03/20/2018: Pt not sure of exact time of dose   . fluticasone (FLONASE) 50 MCG/ACT nasal spray Place 2 sprays into both nostrils daily.   . Fluticasone-Salmeterol (ADVAIR) 250-50 MCG/DOSE AEPB Inhale 1 puff into the lungs 2 (two) times daily.   . furosemide (LASIX) 40 MG tablet Take 1 tablet (40 mg total) by mouth 2  (two) times daily. 03/20/2018: Pt reports not taking 03/20/2018 12:36 AM UPDATED information has been added to the PTA medication list.  Please re-address admission med rec navigator. Thank you,   Nyoka Cowden, Felicia D   . lisinopril (PRINIVIL,ZESTRIL) 2.5 MG tablet Take 1 tablet (2.5 mg total) by mouth daily. 03/20/2018: Pt reports not taking 03/20/2018 12:34 AM UPDATED information has been added to the PTA medication list.  Please re-address admission med rec navigator. Thank you,   Erick Colace D   . Saxagliptin-Metformin 5-500 MG TB24 Take 1 tablet by mouth daily. Take one tab a day for dm   . atorvastatin (LIPITOR) 20 MG tablet Take 1 tablet (20 mg total) by mouth daily. (Patient not taking: Reported on 04/19/2018) 03/20/2018: Pt reports being taken off of this medication  03/20/2018 12:35 AM UPDATED information has been added to the PTA medication list.  Please re-address admission med rec navigator. Thank you,   Nyoka Cowden, Felicia D   . ipratropium-albuterol (DUONEB) 0.5-2.5 (3) MG/3ML SOLN Take 3 mLs by nebulization every 4 (four) hours as needed. 03/20/2018: Pt reports not taking 03/20/2018 12:35 AM UPDATED information has been added to the PTA medication list.  Please re-address admission med rec navigator. Thank you,   Nyoka Cowden, Felicia D   . pantoprazole (PROTONIX) 40 MG tablet Take 1 tablet (40 mg total) by mouth daily. (Patient not taking: Reported on 04/19/2018) 03/20/2018: Pt reports not taking 03/20/2018 12:34 AM UPDATED information has been added to the PTA medication list.  Please re-address  admission med rec navigator. Thank you,   Erick Colace D   . potassium chloride (K-DUR) 10 MEQ tablet Take 2 tablets (20 mEq total) by mouth daily. (Patient not taking: Reported on 04/19/2018) 03/20/2018: Pt reports not taking 03/20/2018 12:34 AM UPDATED information has been added to the PTA medication list.  Please re-address admission med rec navigator. Thank you,   Erick Colace D   . tiotropium (SPIRIVA HANDIHALER) 18 MCG inhalation  capsule Use once day (Patient not taking: Reported on 04/19/2018)   . traMADol (ULTRAM) 50 MG tablet TAKE 1 TABLET BY MOUTH TWICE A DAY FOR KNEE PAIN AS NEEDED (Patient taking differently: Take 50 mg by mouth once daily) 03/20/2018: Pt reports taking once daily-- ran out, needs refill    No facility-administered encounter medications on file as of 04/19/2018.     Functional Status:   In your present state of health, do you have any difficulty performing the following activities: 04/12/2018 03/20/2018  Hearing? N N  Vision? N N  Difficulty concentrating or making decisions? Y N  Walking or climbing stairs? Y Y  Dressing or bathing? Y Y  Doing errands, shopping? Tempie Donning  Preparing Food and eating ? Y -  Using the Toilet? Y -  In the past six months, have you accidently leaked urine? Y -  Do you have problems with loss of bowel control? N -  Managing your Medications? Y -  Managing your Finances? Y -  Housekeeping or managing your Housekeeping? Y -  Some recent data might be hidden    Fall/Depression Screening:    Fall Risk  04/12/2018 03/23/2018 03/17/2018  Falls in the past year? No No Yes  Number falls in past yr: - - 2 or more  Injury with Fall? - - Yes  Risk Factor Category  - - High Fall Risk  Risk for fall due to : - - History of fall(s);Impaired balance/gait;Impaired mobility  Follow up - - Education provided;Falls prevention discussed   PHQ 2/9 Scores 04/12/2018 03/23/2018 03/17/2018 02/22/2018  PHQ - 2 Score 0 0 2 6  PHQ- 9 Score - - 3 -    Assessment:    Met with member at scheduled time, [facility] nurse (Angie) present during visit.  Member has been active with [facility] for several weeks with the [facility] program, receiving assistance from Education officer, museum, PT, and nursing.  Member continue to deny having nebulizer or solution.  Was seen by pulmonologist yesterday, it remains on her active list.  She also does not have any capsules for her Spiriva inhaler.  Advair listed to be  taken, however she has Wixela in possession.  Noted that she will have home sleep study to determine if she will need CPAP, but unsure when this will take place.    Other medication discrepancies include not having Potassium and Protonix (none in the home at all) and Atorvastatin and Furosemide (has medication cards from SNF, but need refills).    She was not available for visit with LCSW to discuss placement at ALF versus PACE program, call placed during visit, LCSW to visit within the next 2 days.  There was some confusion regarding if member actually wanted to be placed or PACE.  Difference between the two explained.  She state she would prefer ALF.  Member has been weighing self daily over the past week, however unclear if current scale is reliable as it is not digital and member may have a hard time using  independently.  Report weight two days ago to be 260 pounds, was 275 pounds at MD office yesterday, and 270 pounds today on home scale again.  Made aware that she will receive new scale in the mail.    Noted per discharge instructions in April 2019 member was to visit cardiologist within 4 weeks.  Per Janace Hoard, daughter was to look into which office member would need to visit, no update.  She will contact daughter regarding cardiology follow up.  Member denies any urgent concerns at this time, advised to contact this care manager with questions.  Plan:   Will place referral to [facility] pharmacist for review/reconciliation. Will collaborate with pharmacist, LCSW, & [facility] for plan of care. Will follow up with member within the next 2 weeks.  THN CM Care Plan Problem One     Most Recent Value  Care Plan Problem One  Risk for readmission related to COPD management as evidenced by recent hospitalization  Role Documenting the Problem One  Care Management Coordinator  Care Plan for Problem One  Not Active  [facility] Long Term Goal   Member will not be admitted to hospital within 31 days of discharge  [facility] Long  Term Goal Start Date  03/24/18  [facility] Long Term Goal Met Date  04/19/18  [facility] CM Short Term Goal #2   Member will schedule appointment with pulmonologist within the next 4 weeks  THN CM Short Term Goal #2 Start Date  03/24/18  [facility] CM Short Term Goal #2 Met Date  04/19/18    [facility] CM Care Plan Problem Two     Most Recent Value  Care Plan Problem Two  Knowledge deficit regardiing management of multiple chronic health conditions as evidenced by member's stated lack of knowledge   Role Documenting the Problem Two  Care Management Coordinator  Care Plan for Problem Two  Active  THN CM Short Term Goal #1   Member will report taking and recording daily weights over the next 4 weeks  THN CM Short Term Goal #1 Start Date  04/19/18  Interventions for Short Term Goal #2   Scale orderd from [employer] via to be provided by [facility]  Calendar tool book provided with log for daily weights, blood pressure, and blood sugars as well as copies of COPD and heart failure zones  THN CM Short Term Goal #2   Member will report taking medications as prescribed over the next 4 weeks   [facility]N CM Short Term Goal #2 Start Date  04/19/18  Interventions for Short Term Goal #2  Referral placed to pharmacy for reconcialiation and review of in home discrepancies.  Call placed to MD to request multiple refills.     Valente David, [place], MSN [facility] [phone]

## 2018-04-19 NOTE — Patient Outreach (Signed)
[facility]) Care Management  04/19/2018  Karen Perez 04/18/1951 [id number]   CSW received a call from patient's RNCM, Valente David, also with [facility] today while Karen Perez was at patient's home for her routine home visit.  Fortunately, CSW was able to converse directly with patient, rescheduling the initial home visit for Thursday, April 21, 2018 at 11:00AM.  Patient indicated that she wrote it down on her calendar. In the meantime, CSW explained to patient that CSW has sent an In [employer], via Epic, to patient's Primary Care Physician, Dr. Clayborn Bigness requesting that an FL-2 Form be completed on patient, as well as a TB Skin Test ordered.  Both the FL-2 Form and the TB Skin Test are requirements for placement into a long-term care assisted living facility. THN CM Care Plan Problem One     Most Recent Value  Care Plan Problem One  Lacks caregiver support to assist with activities of daily living.  Role Documenting the Problem One  Clinical Social Worker  Care Plan for Problem One  Active  [facility] Long Term Goal   Patient will receive 24 hour care and supervision in an assisted living facility within the next 45 days.  THN Long Term Goal Start Date  04/12/18  [facility] CM Short Term Goal #1   Patient will review list of assisted living facilities provided to her by CSW and decide on several facilities of choice, within the next two weeks.  THN CM Short Term Goal #1 Start Date  04/12/18  [facility] CM Short Term Goal #2   CSW and patient will contact patient's primary care physician to request a completed and signed FL-2 Form, necessary for assisted living placement, within the next two weeks.  THN CM Short Term Goal #2 Start Date  04/12/18  [facility] CM Short Term Goal #2 Met Date  04/19/18  Interventions for Short Term Goal #2  CSW has placed an In [employer] request, via Epic, to patient's primary care physician regarding completion and signature of patient's  FL-2 Form.  CSW has also requested that a TB Skin Test be administered, both necessary for placement into an assisted living facility.  THN CM Short Term Goal #3  Patient will contact her primary care physician to schedule a routine visit to have FL-2 Form initiated, as well as a TB Skin Test, both necessary for assisted living placement, within the next two weeks.  THN CM Short Term Goal #3 Start Date  04/12/18    Nat Christen, BSW, MSW, [place]  Licensed Clinical Social Worker  [facility]  Mailing [place]. [address] [place], [place] [address] Physical Address-[address], [place], [place] [address] Toll Free Main # [phone] Fax # [phone] Cell # [phone]  Office # [phone] [email or web address]

## 2018-04-19 NOTE — Patient Outreach (Signed)
[facility]) Care Management  04/19/2018  Karen PolingLinda J Perez 01/26/1951 [id number]   CSW was scheduled to meet with patient today for the initial home visit at 11:00AM; however, patient was not available at the time of CSW's arrival.  CSW waited in patient's driveway for 20 minutes before leaving the home.  CSW also made several attempts to try and contact patient, without success.  CSW left a HIPAA compliant message for patient requesting to reschedule the initial home visit, as well as to ensure patient's safety.  CSW is currently awaiting a return call. CSW also sent an In [employer] Message, via Epic, to patient's Primary Care Physician, Dr. Beverely RisenFozia Khan requesting that an FL-2 Form be initiated on patient, as well as a TB Skin Test, both necessary for placement into a long-term care assisted living facility.  CSW has emailed a blank FL-2 Form to Dr. Welton FlakesKhan for her records. Danford BadJoanna Saporito, BSW, MSW, LCSW  Licensed Restaurant manager, fast foodClinical Social Worker  [facility] Care Management [place] [facility]  [address]ailing [place], [place], [place] [address] Physical Address-[address], [place], [place] [address] Toll Free Main # [phone] Fax # [phone] Cell # [phone]  Office # [phone] [email or web address]

## 2018-04-19 NOTE — Telephone Encounter (Signed)
[name] calling again a/bout Karen Perez and order that she had faxed over for her and if they have been faxed back she can be reached @ [phone].[name]

## 2018-04-20 ENCOUNTER — Telehealth: Payer: Self-pay | Admitting: Pulmonary Disease

## 2018-04-20 DIAGNOSIS — G4733 Obstructive sleep apnea (adult) (pediatric): Secondary | ICD-10-CM | POA: Diagnosis not present

## 2018-04-20 DIAGNOSIS — J441 Chronic obstructive pulmonary disease with (acute) exacerbation: Secondary | ICD-10-CM | POA: Diagnosis not present

## 2018-04-20 DIAGNOSIS — E119 Type 2 diabetes mellitus without complications: Secondary | ICD-10-CM | POA: Diagnosis not present

## 2018-04-20 DIAGNOSIS — I110 Hypertensive heart disease with heart failure: Secondary | ICD-10-CM | POA: Diagnosis not present

## 2018-04-20 DIAGNOSIS — F17210 Nicotine dependence, cigarettes, uncomplicated: Secondary | ICD-10-CM | POA: Diagnosis not present

## 2018-04-20 DIAGNOSIS — M1990 Unspecified osteoarthritis, unspecified site: Secondary | ICD-10-CM | POA: Diagnosis not present

## 2018-04-20 DIAGNOSIS — I5033 Acute on chronic diastolic (congestive) heart failure: Secondary | ICD-10-CM | POA: Diagnosis not present

## 2018-04-20 DIAGNOSIS — J9601 Acute respiratory failure with hypoxia: Secondary | ICD-10-CM | POA: Diagnosis not present

## 2018-04-20 NOTE — Telephone Encounter (Signed)
Spoke with Fleet Contrasachel, and she is still waiting on VS to sign form. Spoke with Harvin HazelKelli and she advised me to route it to her and she would take care of this.

## 2018-04-21 ENCOUNTER — Other Ambulatory Visit: Payer: Self-pay | Admitting: *Deleted

## 2018-04-21 NOTE — Telephone Encounter (Signed)
Spoke with [facility] this morning.  They are still waiting on COPD kit to be signed by VS.  I let her know that Vida Roller was aware and was going to follow up on this.  Apolonio Schneiders said the fax number was on the sheet, for her to fax it back to them.    Will route to [facility] to follow up

## 2018-04-21 NOTE — Patient Outreach (Signed)
[facility]) Care Management  04/21/2018  ADRI SCHLOSS 1951-03-12 [id number]   CSW was able to meet with patient and patient's daughter, Shekia Kuper today to perform the initial home visit.  CSW explained to patient and Ms. Castrillo that CSW has received a return In [employer] from patient's Primary Care Physician, Dr. Clayborn Bigness indicating that an FL-2 Form will be generated on patient and that Dr. Laurelyn Sickle nurse will be contacting CSW directly once the form is completed, signed and ready for pick-up.  Patient and Ms. Yohe were only able to provide CSW with one facility of interest, [facility] in [place], [place].  CSW agreed to fax patient's FL-2 Form to the Admissions Department at [facility] to see if they currently have any female beds available and to ensure that they are able to accommodate patient's needs. CSW has encouraged patient and Ms. Biancardi to contact Joanne Chars with [facility], an agency that connects families with senior healthcare solutions.  This agency will be able to provide patient and Ms. Castello with additional assisted living facilities that are within patient's price range and geographic location.  Ms. Herald agreed to contact Mrs. Brown with [facility] today so that she can provide CSW with a few additional assisted living facilities of interest.  CSW has also encouraged Ms. Vandemark to assist patient with completion of a Long-Term Care Medicaid application, which must be completed at the [place].  Mrs. Brubacher reported that she is off tomorrow and will try to get this taken care of patient. Last, CSW explained to patient and Ms. Preslar that patient will need to have a TB Skin Test administered, a requirement of assisted living placement.  Ms. Veach reported that she plans to contact Dr. Laurelyn Sickle office today to see if they can schedule an appointment to have the test  administered at the office on Friday, April 22, 2018.  Patient is aware that she would need to return to Dr. Laurelyn Sickle office on Monday, April 25, 2018 to have the results read and documented in her medical record.  CSW agreed to follow-up with patient and Ms. Eskridge on Monday, April 25, 2018 to report findings.  Both patient and Ms. Helm voiced understanding and were agreeable to these plans. [facility] CM Care Plan Problem One     Most Recent Value  Care Plan Problem One  Lacks caregiver support to assist with activities of daily living.  Role Documenting the Problem One  Clinical Social Worker  Care Plan for Problem One  Active  [facility] Long Term Goal   Patient will receive 24 hour care and supervision in an assisted living facility within the next 45 days.  [facility]N Long Term Goal Start Date  04/12/18  [facility] CM Short Term Goal #1   Patient will review list of assisted living facilities provided to her by CSW and decide on several facilities of choice, within the next two weeks.  [facility] CM Short Term Goal #1 Start Date  04/12/18  [facility] CM Short Term Goal #1 Met Date  04/21/18  Interventions for Short Term Goal #1  Patient has decided on one asisted living facility of choice.  [facility] CM Short Term Goal #2   CSW and patient will contact patient's primary care physician to request a completed and signed FL-2 Form, necessary for assisted living placement, within the next two weeks.  [facility] CM Short Term Goal #2 Start Date  04/12/18  [facility] CM  Short Term Goal #2 Met Date  04/19/18  [facility] CM Short Term Goal #3  Patient will contact her primary care physician to schedule a routine visit to have FL-2 Form initiated, as well as a TB Skin Test, both necessary for assisted living placement, within the next two weeks.  [facility] CM Short Term Goal #3 Start Date  04/12/18    Nat Christen, BSW, MSW, [place]  Licensed Clinical Social Worker  [place]  Mailing [place]. [address], [place],  [place] [address] Physical Address-[address], [place], [place] [address] Toll Free Main # [phone] Fax # [phone] Cell # [phone]  Office # [phone] [email or web address]

## 2018-04-21 NOTE — Telephone Encounter (Signed)
Called and spoke with Apolonio Schneiders with [facility] at phone [phone] today. Unice Cobble that I have not rec'd the fax for COPD kit at this time. She will be faxing it to my attention today. I advised Apolonio Schneiders that I will call her personally once I receive the paperwork off the fax. Will f/u with Apolonio Schneiders later today.

## 2018-04-22 NOTE — Telephone Encounter (Signed)
Spoke with VS regarding the fax from [place] with [facility] regarding COPD kit for pt. VS denies this kit; advised Apolonio Schneiders of this information. Apolonio Schneiders stated that VS needs to placed "denied" on the form and a reason, and sign it. This will be faxed back to [place] once completed.

## 2018-04-22 NOTE — Telephone Encounter (Signed)
Called and spoke with Apolonio Schneiders with [facility] regarding COPD kit Asked Apolonio Schneiders who authorized the kit process for the pt Apolonio Schneiders advised that pt is high risk of COPD in and out of hospital and has recurring PNA. She was unable to identify the process and who authorized this kit, VS did not. VS denies signing the documents, made Apolonio Schneiders aware of this information Nothing further is needed at this time.

## 2018-04-22 NOTE — Telephone Encounter (Signed)
[facility] has been received and will be given to VS today for signature.

## 2018-04-25 ENCOUNTER — Other Ambulatory Visit: Payer: Self-pay | Admitting: Pharmacist

## 2018-04-25 ENCOUNTER — Other Ambulatory Visit: Payer: Self-pay

## 2018-04-25 ENCOUNTER — Other Ambulatory Visit: Payer: Self-pay | Admitting: *Deleted

## 2018-04-25 ENCOUNTER — Ambulatory Visit: Payer: Medicare HMO

## 2018-04-25 MED ORDER — CLOPIDOGREL BISULFATE 75 MG PO TABS: 75.0000 mg | ORAL_TABLET | Freq: Every day | ORAL | 1 refills | Status: AC

## 2018-04-25 NOTE — Patient Outreach (Signed)
[facility]) Care Management  04/25/2018  Karen Perez 08/30/1951 [id number]  67 year old female currently followed [facility] Care Management.  [facility] services requested for medication management.  PMHx includes, but not limited to, history of tobacco abuse, asthma, COPD, sleep apnea, brain aneurysm, diabetes, HTN, and stroke. Per notes from [facility] RN, patient does not have nebulizer machine or medications, and there are several discrepancies with medication list.    Subjective:  Successful call to [name] today. HIPAA identifiers verified. Patient agreeable to review medications verbally.  She reports she has medications from recent [facility] but cannot find medications during phone call.  She is confused about which medications she is actually taking.  Patient reports that her daughter picks up her medications from pharmacy and assists with filling a pillbox for her weekly.    Objective:  SCr: 0.68 A1C = 5.6 (4/10/'19) K = 3.3 (03/21/2018)  Medications Reviewed Today    Reviewed by Karolee Stamps, LCSW (Social Worker) on 04/25/18 at 1040  Med List Status: <None>  Medication Order Taking? Sig Documenting Provider Last Dose Status Informant  acetaminophen (TYLENOL 8 HOUR ARTHRITIS PAIN) 650 MG CR tablet [id number] No Take 1,300 mg by mouth every 8 (eight) hours as needed for pain. [provider] Taking Active Self  amLODipine (NORVASC) 10 MG tablet [id number] No Take 1 tablet (10 mg total) by mouth daily. Carlean Jews, NP Taking Active Self  atorvastatin (LIPITOR) 20 MG tablet [id number] No Take 1 tablet (20 mg total) by mouth daily.  Patient not taking:  Reported on 04/19/2018   Carlean Jews, NP Not Taking Active Self           Med Note Chilton Si, FELICIA D   Sun Mar 20, 2018 12:35 AM) Pt reports being taken off of this medication  03/20/2018 12:35 AM UPDATED information has been added to the PTA medication list.  Please re-address admission med rec  navigator. Thank you,   Charise Carwin D   clopidogrel (PLAVIX) 75 MG tablet [id number]  Take 1 tablet (75 mg total) by mouth daily. Carlean Jews, NP  Active   fluticasone (FLONASE) 50 MCG/ACT nasal spray [id number] No Place 2 sprays into both nostrils daily. [provider] Taking Active Self  Fluticasone-Salmeterol (ADVAIR) 250-50 MCG/DOSE AEPB [id number] No Inhale 1 puff into the lungs 2 (two) times daily. Carlean Jews, NP Taking Active Self  furosemide (LASIX) 40 MG tablet [id number] No Take 1 tablet (40 mg total) by mouth 2 (two) times daily. [name], MD Taking Active Self           Med Note Chilton Si, FELICIA D   Sun Mar 20, 2018 12:36 AM) Pt reports not taking 03/20/2018 12:36 AM UPDATED information has been added to the PTA medication list.  Please re-address admission med rec navigator. Thank you,   Charise Carwin D   ipratropium-albuterol (DUONEB) 0.5-2.5 (3) MG/3ML SOLN [id number] No Take 3 mLs by nebulization every 4 (four) hours as needed. [name], MD Taking Active Self           Med Note Chilton Si, FELICIA D   Sun Mar 20, 2018 12:35 AM) Pt reports not taking 03/20/2018 12:35 AM UPDATED information has been added to the PTA medication list.  Please re-address admission med rec navigator. Thank you,   Charise Carwin D   lisinopril (PRINIVIL,ZESTRIL) 2.5 MG tablet [id number] No Take 1 tablet (2.5 mg total) by mouth daily. Beverely Risen  Judie Petit, MD Taking Active Self           Med Note Chilton Si, FELICIA D   Sun Mar 20, 2018 12:34 AM) Pt reports not taking 03/20/2018 12:34 AM UPDATED information has been added to the PTA medication list.  Please re-address admission med rec navigator. Thank you,   Charise Carwin D   pantoprazole (PROTONIX) 40 MG tablet [id number] No Take 1 tablet (40 mg total) by mouth daily.  Patient not taking:  Reported on 04/19/2018   [name], MD Not Taking Active Self           Med Note Chilton Si, FELICIA D   Sun Mar 20, 2018 12:34 AM)  Pt reports not taking 03/20/2018 12:34 AM UPDATED information has been added to the PTA medication list.  Please re-address admission med rec navigator. Thank you,   Charise Carwin D   potassium chloride (K-DUR) 10 MEQ tablet [id number] No Take 2 tablets (20 mEq total) by mouth daily.  Patient not taking:  Reported on 04/19/2018   [name], MD Not Taking Active Self           Med Note Chilton Si, FELICIA D   Sun Mar 20, 2018 12:34 AM) Pt reports not taking 03/20/2018 12:34 AM UPDATED information has been added to the PTA medication list.  Please re-address admission med rec navigator. Thank you,   [name]   Saxagliptin-Metformin 5-500 MG TB24 [id number] No Take 1 tablet by mouth daily. Take one tab a day for dm Lyndon Code, MD Taking Active   tiotropium [facility]) 18 MCG inhalation capsule [id number] No Use once day  Patient not taking:  Reported on 04/19/2018   Lyndon Code, MD Not Taking Active   traMADol Janean Sark) 50 MG tablet [id number] No TAKE 1 TABLET BY MOUTH TWICE A DAY FOR KNEE PAIN AS NEEDED  Patient taking differently:  Take 50 mg by mouth once daily   Carlean Jews, NP Taking Active Self           Med Note Chilton Si, FELICIA D   Sun Mar 20, 2018 12:33 AM) Pt reports taking once daily-- ran out, needs refill          Assessment:  Drugs sorted by system:  Neurologic/Psychologic:none  Cardiovascular: amlodipine, atorvastatin, clopidogrel, furosemide, lisinopril  Pulmonary/Allergy: fluticasone NS, fluticasone-salmeterol INH, tiotropium, ipratropium-albuterol NEB  Gastrointestinal: pantroprazole  Endocrine: saxagliptin-metformin  Renal: none  Topical: none  Pain: acetaminophen, tramadol  Vitamins/Minerals:potassium  Infectious Diseases: none  Miscellaneous: none  Duplications in therapy: none Gaps in therapy: none Medications to avoid in the elderly: none Drug interactions: none Other issues noted:   Medication management:  1.   Patient is unsure if she should still be taking furosemide, pantoprazole, and potassium as she does not have any medication at home right now.    Call placed to Dr. Welton Flakes, PCP. RN verified that patient should still be on these medications.  Per [facility], no refills remain on these medications.  In-basket request sent to Dr. Welton Flakes and CMA to send in new prescriptions for these medications. Patient picked up 90 day supply of amlodipine, clopidogrel, saxagliptin-metformin at home.  Atorvastatin 90 day supply is ready for pick-up for $3.40.  Patient made aware and reports she will pick this up today.    2. Patient has prescription for ipratropium-albuterol nebulizer but no nebulizer machine at home.    Call placed to Dr. Craige Cotta, pulmonologist.  RN will send in new prescription for machine  to company that can ship machine directly to patient's home.    3. Patient using inhalers PRN rather than scheduled.  Per [facility], tiotropium and fluticasone-salmeteral last filled several months ago.   I counseled patient verbally on correct administration of inhalers and dosing strategies.  Patient will likely need continued reinforcement on COPD management.  4. Transportation: Patient missed PCP office visit yesterday as her vehicle is not working.  She will try to coordinate with her daughter to reschedule appointment.  [facility] if patient unable to procure transportation from family.    Plan: I will follow-up with patient later this week to ensure she has picked up appropriate prescriptions, has been contacted about nebulizer machine, and has rescheduled PCP appointment.   Haynes Hoehnolleen Antwine Agosto, PharmD, [facility] Clinical Pharmacist [facility] [phone]

## 2018-04-25 NOTE — Patient Outreach (Signed)
[facility]) Care Management  04/25/2018  KATHRENE SINOPOLI October 24, 1951 [id number]   CSW was able to make contact with patient today to follow-up regarding social work services and resources.  Patient admits that she was unable to go to her Primary Care Physician, Dr. Etta Quill office on Friday, April 22, 2018, as planned, to have her TB Skin Test administered.  Patient reported that her daughter, Miaisabella Bacorn was unable to transport her; therefore, she had to cancel the appointment.  Patient indicated that she is not sure when she will be able to go this week to have the test administered.  CSW encouraged patient to try and get a ride with Mrs. Campoy on her day off.  Patient voiced understanding and was agreeable to this plan. CSW explained to patient that CSW is still awaiting a call from Dr. Humphrey Rolls or her nurse with regards to when CSW will be able to pick up patient's completed an signed FL-2 Form.  CSW has left a second HIPAA compliant message for Dr. Laurelyn Sickle nurse and is currently awaiting a return call.  CSW will fax patient's FL-2 Form to all facilities of interest, once the FL-2 Form is obtained.  Patient agreed to contact CSW once the TB Skin Test has been administered, a necessary requirement for assisted living placement.  CSW agreed to follow-up with patient again on Friday, April 29, 2018, if a return call is not received from patient in the meantime. [facility] CM Care Plan Problem One     Most Recent Value  Care Plan Problem One  Lacks caregiver support to assist with activities of daily living.  Role Documenting the Problem One  Clinical Social Worker  Care Plan for Problem One  Active  [facility] Long Term Goal   Patient will receive 24 hour care and supervision in an assisted living facility within the next 45 days.  [facility] Long Term Goal Start Date  04/12/18  [facility] CM Short Term Goal #1   Patient will review list of assisted living facilities provided to her by CSW and decide on several  facilities of choice, within the next two weeks.  [facility] CM Short Term Goal #1 Start Date  04/12/18  [facility] CM Short Term Goal #1 Met Date  04/21/18  [facility] CM Short Term Goal #2   CSW and patient will contact patient's primary care physician to request a completed and signed FL-2 Form, necessary for assisted living placement, within the next two weeks.  [facility] CM Short Term Goal #2 Start Date  04/12/18  [facility] CM Short Term Goal #2 Met Date  04/19/18  [facility] CM Short Term Goal #3  Patient will contact her primary care physician to schedule a routine visit to have FL-2 Form initiated, as well as a TB Skin Test, both necessary for assisted living placement, within the next two weeks.  [facility] CM Short Term Goal #3 Start Date  04/12/18    Nat Christen, BSW, MSW, [place]  Licensed Clinical Social Worker  [place]  Mailing [place]. [address] [place], [place] [address] Physical Address-[address], [place], [place] [address] Toll Free Main # [phone] Fax # [phone] Cell # [phone]  Office # [phone] [email or web address]

## 2018-04-26 ENCOUNTER — Other Ambulatory Visit: Payer: Self-pay | Admitting: Internal Medicine

## 2018-04-26 ENCOUNTER — Telehealth: Payer: Self-pay | Admitting: Pulmonary Disease

## 2018-04-26 DIAGNOSIS — J449 Chronic obstructive pulmonary disease, unspecified: Secondary | ICD-10-CM

## 2018-04-26 MED ORDER — PANTOPRAZOLE SODIUM 40 MG PO TBEC: 40.0000 mg | DELAYED_RELEASE_TABLET | Freq: Every day | ORAL | 3 refills | Status: DC

## 2018-04-26 MED ORDER — FUROSEMIDE 40 MG PO TABS: 40.0000 mg | ORAL_TABLET | Freq: Two times a day (BID) | ORAL | 3 refills | Status: DC

## 2018-04-26 NOTE — Telephone Encounter (Signed)
Spoke with Jill Sideolleen with [facility]-aware that I have sent order for nebulizer machine to DME-they will contact patient to set up payment information and will mail to her home.    When Rx refill is needed for Duoneb patient will either contact our office or have pharmacy send refill request to VS. Rx was started by hospital.   Nothing more needed at this time.

## 2018-04-27 ENCOUNTER — Ambulatory Visit: Payer: Self-pay

## 2018-04-27 DIAGNOSIS — I110 Hypertensive heart disease with heart failure: Secondary | ICD-10-CM | POA: Diagnosis not present

## 2018-04-27 DIAGNOSIS — E119 Type 2 diabetes mellitus without complications: Secondary | ICD-10-CM | POA: Diagnosis not present

## 2018-04-27 DIAGNOSIS — M1990 Unspecified osteoarthritis, unspecified site: Secondary | ICD-10-CM | POA: Diagnosis not present

## 2018-04-27 DIAGNOSIS — F17210 Nicotine dependence, cigarettes, uncomplicated: Secondary | ICD-10-CM | POA: Diagnosis not present

## 2018-04-27 DIAGNOSIS — J9601 Acute respiratory failure with hypoxia: Secondary | ICD-10-CM | POA: Diagnosis not present

## 2018-04-27 DIAGNOSIS — I5033 Acute on chronic diastolic (congestive) heart failure: Secondary | ICD-10-CM | POA: Diagnosis not present

## 2018-04-27 DIAGNOSIS — G4733 Obstructive sleep apnea (adult) (pediatric): Secondary | ICD-10-CM | POA: Diagnosis not present

## 2018-04-27 DIAGNOSIS — J441 Chronic obstructive pulmonary disease with (acute) exacerbation: Secondary | ICD-10-CM | POA: Diagnosis not present

## 2018-04-28 ENCOUNTER — Other Ambulatory Visit: Payer: Self-pay | Admitting: *Deleted

## 2018-04-28 NOTE — Patient Outreach (Signed)
[facility]) Care Management  04/28/2018  Karen Perez 10-18-1951 [id number]   CSW received a return call from Karen Perez's daughter, Aliah Eriksson today in response to the HIPAA compliant voicemail message left for Karen Perez earlier in the day by CSW.  CSW explained to Karen Perez that CSW is still waiting to receive a call from Karen Perez's Primary Care Physician, Dr. Etta Quill office, letting CSW know that the completed and signed FL-2 Form is ready for pick-up.  Karen Perez indicated that Dr. Humphrey Rolls is waiting for CSW to provide her with the FL-2 Form.  CSW further explained to Karen Perez that Karen Perez faxed the form to Dr. Laurelyn Sickle office, receiving fax confirmation.  CSW agreed to contact Dr. Laurelyn Sickle office to inquire about the form and sent them a new one, if necessary.  CSW encouraged Karen Perez to go ahead and apply for Long-Term Care Medicaid for Karen Perez at the [place], as Ms. Popoca is already voicing concerns about how they will pay for Karen Perez to go into an assisted living facility.  CSW provided Karen Perez with a list of assisted living facilities that accept Long-Term Care Medicaid, explaining to Karen Perez that Karen Perez may be required to private pay until she is approved for Long-Term Care Medicaid benefits.  Karen Perez is aware that the application process can take 45-60 to determine eligibility.  Karen Perez agreed to go ahead and apply within the next week.  Once CSW receives the completed and signed FL-2 Form, CSW has agreed to fax to all assisted living facilities in [facility], per Karen Perez request.  Karen Perez has been encouraged to go and view facilities of interest and report findings to [place].  CSW and Karen Perez agreed to converse again on Tuesday, May 03, 2018, in hopes that both parties will have information to report.  Karen Perez has CSW's contact information and has been encouraged to contact CSW if additional  social work needs arise in the meantime or if Karen Perez has questions regarding the whole placement process.  Karen Perez voiced understanding and was agreeable to this plan.  [facility] CM Care Plan Problem One     Most Recent Value  Care Plan Problem One  Lacks caregiver support to assist with activities of daily living.  Role Documenting the Problem One  Clinical Social Worker  Care Plan for Problem One  Active  [facility] Long Term Goal   Karen Perez will receive 24 hour care and supervision in an assisted living facility within the next 45 days.  [facility] Long Term Goal Start Date  04/12/18  [facility] CM Short Term Goal #1   Karen Perez will review list of assisted living facilities provided to her by CSW and decide on several facilities of choice, within the next two weeks.  [facility] CM Short Term Goal #1 Start Date  04/12/18  [facility] CM Short Term Goal #1 Met Date  04/21/18  [facility] CM Short Term Goal #2   CSW and Karen Perez will contact Karen Perez's primary care physician to request a completed and signed FL-2 Form, necessary for assisted living placement, within the next two weeks.  [facility] CM Short Term Goal #2 Start Date  04/12/18  [facility] CM Short Term Goal #2 Met Date  04/19/18  [facility] CM Short Term Goal #3  Karen Perez will contact her primary care physician to schedule a routine visit to have FL-2 Form initiated, as well as a TB Skin Test, both necessary for assisted living  placement, within the next two weeks.  [facility] CM Short Term Goal #3 Start Date  04/12/18    [facility] CM Care Plan Problem Two     Most Recent Value  Care Plan Problem Two  Lack of financial resources to pay for assisted living facility.  Role Documenting the Problem Two  Clinical Social Worker  Care Plan for Problem Two  Active  [facility] CM Short Term Goal #1   Karen Perez's daughter has agreed to go to the [place] to apply for [employer], within the next week.  [facility] CM Short Term Goal #1 Start Date  04/28/18  Interventions for Short Term  Goal #2   Karen Perez's daughter agreed to apply for Long-Term Care Medicaid on Karen Perez's behalf.      Nat Christen, BSW, MSW, LCSW  Licensed Education officer, [facility]  Mailing [place] [address] [place], [place] [address] Physical Address-[address], [place], [place] [address] Toll Free Main # [phone] Fax # [phone] Cell # [phone]  Office # [phone] [email or web address]

## 2018-04-28 NOTE — Patient Outreach (Signed)
[facility]) Care Management  04/28/2018  Karen PolingLinda J Kellis 01/03/1951 [id number]   CSW received an In [employer]asket Message from BlakelyMonica Lane, [name] with [facility] Care Management, indicating that patient's daughter, [name] would like for CSW to contact her directly.  CSW made an attempt to try and contact patient's daughter, [name] today on both numbers provided (home and cell), but was unsuccessful.  CSW left HIPAA compliant messages for [name] and is currently awaiting a return call. [name], BSW, MSW, LCSW  Licensed Restaurant manager, fast foodClinical Social Worker  [facility] Care Management [name]  Mailing [name], [name], [name] Physical Address-[address], [place], [place] [address] Toll Free Main # [phone] Fax # [phone] Cell # [phone]  Office # [phone] [name]

## 2018-04-29 ENCOUNTER — Ambulatory Visit: Payer: Self-pay | Admitting: *Deleted

## 2018-04-29 ENCOUNTER — Other Ambulatory Visit: Payer: Self-pay | Admitting: Pharmacist

## 2018-04-29 ENCOUNTER — Ambulatory Visit: Payer: Self-pay | Admitting: Pharmacist

## 2018-04-29 NOTE — Patient Outreach (Signed)
[facility](THN) Care Management  04/29/2018  Karen PolingLinda J Perez 02/17/1951 [id number]   Successful outreach call to Ms. Karen MusaLinda Perez.  HIPAA identifiers verified. Patient reports she has picked up several medication refills this week from her pharmacy.  She also reports that she has been contacted regarding a nebulizer machine and her daughter is working to get this ordered.  She is agreeable to a home visit next week to review medications and inhaler technique.   Successful outreach call to patient's daughter.  Daughter reports she has not incorporated new medications picked up from pharmacy this week into patient's pillbox yet.  She voiced frustration with not having any help with her mother during the day with her medications.  We reviewed compliance packaging and daughter thinks this would be a valuable service.  I reviewed with daughter that since patient has had several 90 day supply of medications filled recently she would not be able to start this untl refills are due and prescriptions transferred to a pharmacy that offers compliance packaging.  Daughter confirms she is working to have nebulizer machine sent to the house.   Daughter is not able to be present for a home visit with patient due to work schedule.  I provided my phone number if she would like to reach me regarding medications.   Plan: Home visit with patient next Tuesday, June 18th at 1:30PM to review medications, provide medication list, and counsel on inhaler technique.   Karen Perez, PharmD, [facility] Clinical Pharmacist [facility] [phone]

## 2018-05-03 ENCOUNTER — Other Ambulatory Visit: Payer: Self-pay | Admitting: *Deleted

## 2018-05-03 ENCOUNTER — Other Ambulatory Visit: Payer: Self-pay | Admitting: Pharmacist

## 2018-05-03 NOTE — Patient Outreach (Addendum)
[facility]) Care Management  05/03/2018  Karen Perez 15-Aug-1951 [id number]   CSW was able to make contact with patient's daughter, Karen Perez today to follow-up regarding long-term care assisted living placement for patient.  CSW explained to [name] that CSW is leaving the office around 10:30AM this morning to hand deliver the FL-2 Form to patient's Primary Care Physician, Dr. Etta Quill office.  CSW has been faxing the FL-2 Form to Dr. Humphrey Rolls, but according to staff in the office, Dr. Humphrey Rolls only accepts the FL-2 Form if it is hand-delivered.  Unfortunately, Dr. Humphrey Rolls is not in the office today, so CSW will not be able to fax patient's completed and signed FL-2 Form to all assisted living facilities of choice.  CSW encouraged Dr. Laurelyn Sickle staff to contact CSW directly when the FL-2 Form is ready for pick-up.  In the meantime, [name] reported that she is learning all about Long-Term Care Medicaid and plans to apply in person at the [place], as she would like for patient to be placed in [place].  [name] has provided CSW with a list of facilities that she is interested in.  CSW will fax the FL-2 Form to all facilities of choice, once the completed and signed FL-2 Form is obtained. [name] voiced understanding and was agreeable to this plan.  CSW will contact Dr. Laurelyn Sickle office periodically to check the status of the FL-2 Form for completion.  CSW agreed to follow-up with [name] within the week to report findings.  [facility] CM Care Plan Problem One     Most Recent Value  Care Plan Problem One  Lacks caregiver support to assist with activities of daily living.  Role Documenting the Problem One  Clinical Social Worker  Care Plan for Problem One  Active  [facility] Long Term Goal   Patient will receive 24 hour care and supervision in an assisted living facility within the next 45 days.  [facility] Long Term Goal Start Date   04/12/18  [facility] CM Short Term Goal #1   Patient will review list of assisted living facilities provided to her by CSW and decide on several facilities of choice, within the next two weeks.  [facility] CM Short Term Goal #1 Start Date  04/12/18  [facility] CM Short Term Goal #1 Met Date  04/21/18  [facility] CM Short Term Goal #2   CSW and patient will contact patient's primary care physician to request a completed and signed FL-2 Form, necessary for assisted living placement, within the next two weeks.  [facility] CM Short Term Goal #2 Start Date  04/12/18  [facility] CM Short Term Goal #2 Met Date  04/19/18  [facility] CM Short Term Goal #3  Patient will contact her primary care physician to schedule a routine visit to have FL-2 Form initiated, as well as a TB Skin Test, both necessary for assisted living placement, within the next two weeks.  [facility] CM Short Term Goal #3 Start Date  04/12/18  [facility] CM Short Term Goal #3 Met Date  05/03/18  [facility] Goal #3  Per primary care physician's request, CSW will hand deliver the FL-2 Form to Dr. Etta Quill office today for her to complete and sign.    [facility] CM Care Plan Problem Two     Most Recent Value  Care Plan Problem Two  Lack of financial resources to pay for assisted living facility.  Role Documenting the Problem  Two  Clinical Social Worker  Care Plan for Problem Two  Active  [facility] CM Short Term Goal #1   Patient's daughter has agreed to go to the [place] to apply for [employer], within the next week.  [facility] CM Short Term Goal #1 Start Date  04/28/18      Nat Christen, BSW, MSW, [place]  Licensed Clinical Social Worker  [place]  Mailing [place]. [address] [place], [place] [address] Physical Address-[address], [place], [place] [address] Toll Free Main # [phone] Fax # [phone] Cell # [phone]  Office #  [phone] [email or web address]

## 2018-05-03 NOTE — Patient Outreach (Signed)
[facility]) Care Management  05/03/2018  Karen PolingLinda J Perez 04/17/1951 [id number]   Patient was not at home for scheduled home visit today.  I called patient's home phone and a female answered who stated patient was getting her hair fixed right now and would not be back for at least 30-60 minutes.  I spoke with patient's daughter who verified patient is not at home but is still interested in reviewing medications.    Plan: I will contact patient after she returns home to reschedule appointment.   Karen Perez, PharmD, [facility] Clinical Pharmacist [facility] [phone]

## 2018-05-04 ENCOUNTER — Other Ambulatory Visit: Payer: Self-pay | Admitting: Pharmacist

## 2018-05-04 ENCOUNTER — Telehealth: Payer: Self-pay

## 2018-05-04 DIAGNOSIS — F17210 Nicotine dependence, cigarettes, uncomplicated: Secondary | ICD-10-CM | POA: Diagnosis not present

## 2018-05-04 DIAGNOSIS — J441 Chronic obstructive pulmonary disease with (acute) exacerbation: Secondary | ICD-10-CM | POA: Diagnosis not present

## 2018-05-04 DIAGNOSIS — J9601 Acute respiratory failure with hypoxia: Secondary | ICD-10-CM | POA: Diagnosis not present

## 2018-05-04 DIAGNOSIS — I110 Hypertensive heart disease with heart failure: Secondary | ICD-10-CM | POA: Diagnosis not present

## 2018-05-04 DIAGNOSIS — M1990 Unspecified osteoarthritis, unspecified site: Secondary | ICD-10-CM | POA: Diagnosis not present

## 2018-05-04 DIAGNOSIS — I5033 Acute on chronic diastolic (congestive) heart failure: Secondary | ICD-10-CM | POA: Diagnosis not present

## 2018-05-04 DIAGNOSIS — G4733 Obstructive sleep apnea (adult) (pediatric): Secondary | ICD-10-CM | POA: Diagnosis not present

## 2018-05-04 DIAGNOSIS — E119 Type 2 diabetes mellitus without complications: Secondary | ICD-10-CM | POA: Diagnosis not present

## 2018-05-04 NOTE — Patient Outreach (Signed)
[facility]) Care Management  05/04/2018  Karen PolingLinda J Perez 08/29/1951 [id number]  Successful telephone call to Ms. Karen Perez to reschedule home visit.  Ms. Karen Perez reports she has picked up her new medications from the pharmacy but does not know the names of the medications.  She has not rescheduled her PCP visit as she is dependent on daughter to take her.  I encouraged her to have the daughter reschedule the appointment as soon as possible.  Patient voiced understanding.    Plan: I will meet with patient on July 2nd, 2019 at 1:30PM to review medications.   Karen Perez, PharmD, [facility] Clinical Pharmacist [facility] [phone]

## 2018-05-04 NOTE — Telephone Encounter (Signed)
Patients daughter dropped off FL2 form to be completed, gave to [place] and Tina to complete. Beth

## 2018-05-05 ENCOUNTER — Telehealth: Payer: Self-pay

## 2018-05-05 ENCOUNTER — Other Ambulatory Visit: Payer: Self-pay | Admitting: Nurse Practitioner

## 2018-05-05 ENCOUNTER — Other Ambulatory Visit: Payer: Self-pay | Admitting: *Deleted

## 2018-05-05 DIAGNOSIS — N390 Urinary tract infection, site not specified: Secondary | ICD-10-CM

## 2018-05-05 DIAGNOSIS — F17210 Nicotine dependence, cigarettes, uncomplicated: Secondary | ICD-10-CM | POA: Diagnosis not present

## 2018-05-05 DIAGNOSIS — E119 Type 2 diabetes mellitus without complications: Secondary | ICD-10-CM | POA: Diagnosis not present

## 2018-05-05 DIAGNOSIS — J441 Chronic obstructive pulmonary disease with (acute) exacerbation: Secondary | ICD-10-CM | POA: Diagnosis not present

## 2018-05-05 DIAGNOSIS — I5033 Acute on chronic diastolic (congestive) heart failure: Secondary | ICD-10-CM | POA: Diagnosis not present

## 2018-05-05 DIAGNOSIS — M1990 Unspecified osteoarthritis, unspecified site: Secondary | ICD-10-CM | POA: Diagnosis not present

## 2018-05-05 DIAGNOSIS — I110 Hypertensive heart disease with heart failure: Secondary | ICD-10-CM | POA: Diagnosis not present

## 2018-05-05 DIAGNOSIS — G4733 Obstructive sleep apnea (adult) (pediatric): Secondary | ICD-10-CM | POA: Diagnosis not present

## 2018-05-05 DIAGNOSIS — J9601 Acute respiratory failure with hypoxia: Secondary | ICD-10-CM | POA: Diagnosis not present

## 2018-05-05 MED ORDER — NITROFURANTOIN MONOHYD MACRO 100 MG PO CAPS: 100.0000 mg | ORAL_CAPSULE | Freq: Two times a day (BID) | ORAL | 0 refills | Status: DC

## 2018-05-05 NOTE — Patient Outreach (Signed)
[facility]) Care Management  05/05/2018  Karen Perez Apr 11, 1951 [id number]   CSW was able to make contact with patient's daughter, Karen Perez today to follow-up regarding long-term care placement arrangements for patient.  CSW explained to Karen Perez that CSW dropped off the FL-2 Form to patient's Primary Care Physician, Dr. Etta Quill office on Tuesday, May 03, 2018, receiving a return call from Dr. Laurelyn Sickle nurse indicating that Dr. Humphrey Rolls is out of the office this week.  Dr. Laurelyn Sickle nurse reported that Dr. Humphrey Rolls will try to complete the FL-2 Form next week and contact CSW once the form is completed, signed and ready for pick-up.  Once the form is obtained, CSW will fax to all assisted living facilities of choice.  Karen Perez voiced understanding and was agreeable to this plan.  In the meantime, CSW encouraged Karen Perez to go ahead and apply for Long-Term Care Medicaid for patient, as Karen Perez reports that patient is not in a financial position to private pay at an assisted living facility.  CSW agreed to follow-up with Karen Perez mid-week to report findings.  [facility] CM Care Plan Problem One     Most Recent Value  Care Plan Problem One  Lacks caregiver support to assist with activities of daily living.  Role Documenting the Problem One  Clinical Social Worker  Care Plan for Problem One  Active  [facility] Long Term Goal   Patient will receive 24 hour care and supervision in an assisted living facility within the next 45 days.  [facility] Long Term Goal Start Date  04/12/18  [facility] CM Short Term Goal #1   Patient will review list of assisted living facilities provided to her by CSW and decide on several facilities of choice, within the next two weeks.  [facility] CM Short Term Goal #1 Start Date  04/12/18  [facility] CM Short Term Goal #1 Met Date  04/21/18  [facility] CM Short Term Goal #2   CSW and patient will contact patient's primary care physician to request a completed and signed FL-2 Form, necessary  for assisted living placement, within the next two weeks.  [facility] CM Short Term Goal #2 Start Date  04/12/18  [facility] CM Short Term Goal #2 Met Date  04/19/18  [facility] CM Short Term Goal #3  Patient will contact her primary care physician to schedule a routine visit to have FL-2 Form initiated, as well as a TB Skin Test, both necessary for assisted living placement, within the next two weeks.  [facility] CM Short Term Goal #3 Start Date  04/12/18  [facility] CM Short Term Goal #3 Met Date  05/03/18    [facility] CM Care Plan Problem Two     Most Recent Value  Care Plan Problem Two  Lack of financial resources to pay for assisted living facility.  Role Documenting the Problem Two  Clinical Social Worker  Care Plan for Problem Two  Active  [facility] CM Short Term Goal #1   Patient's daughter has agreed to go to the [place] to apply for [employer], within the next week.  [facility] CM Short Term Goal #1 Start Date  04/28/18      Nat Christen, BSW, MSW, [place]  Licensed Clinical Social Worker  [place]  Mailing [place]. [address] [place], [place] [address] Physical Address-[address], [place], [place] [address] Toll Free Main # [phone] Fax # [phone] Cell # [phone]  Office # [phone] [email or web address]

## 2018-05-05 NOTE — Telephone Encounter (Signed)
Patient's caregiver called stating that patient exhibiting signs of UTI. Sent prescription for macrobid 100mg bid for 7 days to [facility] [address] in [place].  

## 2018-05-05 NOTE — Progress Notes (Signed)
Fl -2 form completed and left up front for pick up

## 2018-05-05 NOTE — Progress Notes (Signed)
Patient's caregiver called stating that patient exhibiting signs of UTI. Sent prescription for macrobid 100mg  bid for 7 days to [facility] on [address] in [place].

## 2018-05-06 ENCOUNTER — Ambulatory Visit: Payer: Self-pay | Admitting: *Deleted

## 2018-05-06 NOTE — Telephone Encounter (Signed)
Angel from [facility] wanted to verbally confirm that it Is it ok for them to give pt the prescription without ordering a urinalysis for her?

## 2018-05-09 ENCOUNTER — Other Ambulatory Visit: Payer: Self-pay | Admitting: *Deleted

## 2018-05-09 NOTE — Patient Outreach (Signed)
[facility]) Care Management  05/09/2018  Jackelyn PolingLinda J Motyka 09/02/1951 [id number]   Voice message received from Gavin Poundeborah, social worker with [facility], requesting update regarding placement.  Per LCSW note, FL2 has been requested from MD office.  Call placed back to Gavin PoundDeborah, voice message left with LCSW J. Saporito's number for more information.    Kemper DurieMonica Soren Lazarz, CaliforniaRN, MSN [facility] Care Management  [facility] Care Manager [phone]

## 2018-05-09 NOTE — Telephone Encounter (Signed)
Faxed FL2 form to joann [phone]

## 2018-05-11 ENCOUNTER — Emergency Department (HOSPITAL_COMMUNITY): Payer: Medicare HMO

## 2018-05-11 ENCOUNTER — Encounter (HOSPITAL_COMMUNITY): Payer: Self-pay

## 2018-05-11 ENCOUNTER — Emergency Department (HOSPITAL_COMMUNITY)
Admission: EM | Admit: 2018-05-11 | Discharge: 2018-05-11 | Disposition: A | Discharge status: Home / Self Care | Payer: Medicare HMO | Attending: Emergency Medicine | Admitting: Emergency Medicine

## 2018-05-11 ENCOUNTER — Other Ambulatory Visit: Payer: Self-pay | Admitting: *Deleted

## 2018-05-11 DIAGNOSIS — R0602 Shortness of breath: Secondary | ICD-10-CM | POA: Diagnosis not present

## 2018-05-11 DIAGNOSIS — E119 Type 2 diabetes mellitus without complications: Secondary | ICD-10-CM | POA: Insufficient documentation

## 2018-05-11 DIAGNOSIS — J449 Chronic obstructive pulmonary disease, unspecified: Secondary | ICD-10-CM | POA: Diagnosis not present

## 2018-05-11 DIAGNOSIS — M25561 Pain in right knee: Secondary | ICD-10-CM | POA: Insufficient documentation

## 2018-05-11 DIAGNOSIS — I10 Essential (primary) hypertension: Secondary | ICD-10-CM | POA: Insufficient documentation

## 2018-05-11 DIAGNOSIS — S8991XA Unspecified injury of right lower leg, initial encounter: Secondary | ICD-10-CM | POA: Diagnosis not present

## 2018-05-11 MED ORDER — HYDROCODONE-ACETAMINOPHEN 5-325 MG PO TABS: 1.0000 | ORAL_TABLET | Freq: Four times a day (QID) | ORAL | 0 refills | Status: DC | PRN

## 2018-05-11 MED ORDER — HYDROCODONE-ACETAMINOPHEN 5-325 MG PO TABS
1.0000 | ORAL_TABLET | Freq: Once | ORAL | Status: AC
  Administered 2018-05-11: 1 via ORAL
  Filled 2018-05-11: qty 1

## 2018-05-11 MED ORDER — CEPHALEXIN 250 MG PO CAPS
500.0000 mg | ORAL_CAPSULE | Freq: Once | ORAL | Status: AC
  Administered 2018-05-11: 500 mg via ORAL
  Filled 2018-05-11: qty 2

## 2018-05-11 MED ORDER — IBUPROFEN 400 MG PO TABS: 400.0000 mg | ORAL_TABLET | Freq: Four times a day (QID) | ORAL | 0 refills | Status: DC | PRN

## 2018-05-11 MED ORDER — IPRATROPIUM-ALBUTEROL 0.5-2.5 (3) MG/3ML IN SOLN
3.0000 mL | RESPIRATORY_TRACT | Status: DC
  Administered 2018-05-11 (×2): 3 mL via RESPIRATORY_TRACT
  Filled 2018-05-11 (×2): qty 3

## 2018-05-11 MED ORDER — IBUPROFEN 400 MG PO TABS
400.0000 mg | ORAL_TABLET | Freq: Once | ORAL | Status: AC
  Administered 2018-05-11: 400 mg via ORAL
  Filled 2018-05-11: qty 1

## 2018-05-11 MED ORDER — CLOTRIMAZOLE 1 % EX CREA: TOPICAL_CREAM | CUTANEOUS | 0 refills | Status: AC

## 2018-05-11 MED ORDER — CEPHALEXIN 500 MG PO CAPS: 500.0000 mg | ORAL_CAPSULE | Freq: Four times a day (QID) | ORAL | 0 refills | Status: DC

## 2018-05-11 NOTE — ED Notes (Signed)
  Patient tolerated ambulation to and from the restroom.

## 2018-05-11 NOTE — ED Triage Notes (Signed)
Patient slipped out of walker seat today landing on grass/ part pavement. No loc. Complains of right knee pain, no other injury. Arrived with sats in high 80s. States she doesn't wear o2, denies SOB. Alert and oriented, family reports hat patient is being worked up for the low sats, placed on 2L and sats 95

## 2018-05-11 NOTE — ED Provider Notes (Signed)
Emergency Department Provider Note   I have reviewed the triage vital signs and the nursing notes.   HISTORY  Chief Complaint No chief complaint on file.   HPI Karen Perez is a 67 y.o. female mechanical fall in her right knee subsequent knee pain here for evaluation.  She also had low oxygen saturation with family states this is not new and she is almost never above 90%.  Have her try to get her oxygen at home but have difficulty with it.  Patient states that she does have a shortness of breath when she walks but this is her baseline.  She did not hit anything else when she fell.  She is only hurt and over her right patella.  She notes a bit swelling there.  She also complains of having some itchy burning feeling between her toes.  Is been going on off and on for the last couple months per No other associated or modifying symptoms.    Past Medical History:  Diagnosis Date  . Arthritis   . Asthma   . Brain aneurysm   . COPD (chronic obstructive pulmonary disease) (HCC)   . Diabetes mellitus without complication (HCC)   . Hypertension   . Stroke [facility]) 2003    Patient Active Problem List   Diagnosis Date Noted  . Acute respiratory failure with hypoxia (HCC) 02/22/2018  . COPD with acute exacerbation (HCC) 02/22/2018  . Diabetes mellitus type 2 in obese (HCC) 02/22/2018  . Acute respiratory failure with hypoxia and hypercapnia (HCC) 02/22/2018  . HOT FLASHES 05/02/2010  . DIABETES MELLITUS, TYPE II 01/24/2010  . SKIN TAG 10/29/2009  . KNEE PAIN, BILATERAL 10/04/2009  . CHRONIC RHINITIS 08/01/2009  . VOCAL CORD DISORDER 08/01/2009  . GERD 08/01/2009  . SKIN RASH 06/28/2009  . HYPOXEMIA 05/21/2009  . Nonspecific (abnormal) findings on radiological and other examination of body structure 05/16/2009  . ABNORMAL CHEST XRAY 05/16/2009  . OBESITY, MORBID 04/30/2009  . TOBACCO USER 04/30/2009  . TINEA CORPORIS 04/19/2009  . HEMOCCULT POSITIVE STOOL 02/07/2009  . LEG  CRAMPS 12/24/2008  . DERMATITIS, ALLERGIC 06/28/2008  . BACK PAIN, LUMBAR 05/04/2008  . OBSTRUCTIVE SLEEP APNEA 10/17/2007  . Essential hypertension 04/20/2007  . HYPERLIPIDEMIA 03/21/2007  . DEPRESSION 03/21/2007  . CEREBRAL ANEURYSM 03/21/2007  . COPD 03/21/2007  . ARTHRITIS 03/21/2007  . DVT, HX OF 03/21/2007    Past Surgical History:  Procedure Laterality Date  . CEREBRAL ANEURYSM REPAIR  2013    Current Outpatient Rx  . Order #: [id number] Class: Historical Med  . Order #: [id number] Class: Normal  . Order #: [id number] Class: Normal  . Order #: [id number] Class: Normal  . Order #: [id number] Class: Historical Med  . Order #: [id number] Class: Normal  . Order #: [id number] Class: Normal  . Order #: [id number] Class: Normal  . Order #: [id number] Class: Normal  . Order #: [id number] Class: Normal  . Order #: [id number] Class: Normal  . Order #: [id number] Class: Normal  . Order #: [id number] Class: Normal  . Order #: [id number] Class: Print  . Order #: [id number] Class: Print  . Order #: [id number] Class: Print  . Order #: [id number] Class: Print    Allergies Amoxicillin-pot clavulanate and Neosporin [neomycin-bacitracin zn-polymyx]  Family History  Problem Relation Age of Onset  . Diabetes Mellitus II Daughter     Social History Social History   Tobacco Use  . Smoking status: Former Smoker    Last attempt to quit: 02/15/2018    Years since quitting: 0.2  . Smokeless tobacco: Never Used  Substance Use Topics  . Alcohol use: No  . Drug use: No    Review of Systems  All other systems negative except as documented in the HPI. All pertinent positives and negatives as reviewed in the HPI. ____________________________________________   PHYSICAL EXAM:  VITAL SIGNS: ED Triage Vitals  Enc Vitals Group     BP 05/11/18 1237 119/62     Pulse Rate 05/11/18 1237 80     Resp 05/11/18 1237 20     Temp 05/11/18 1237 98.3 F (36.8 C)     Temp Source 05/11/18 1237 Oral     SpO2 05/11/18 1237  (!) 88 %     Weight --      Height --      Head Circumference --      Peak Flow --      Pain Score 05/11/18 1243 6     Pain Loc --      Pain Edu? --      Excl. in GC? --     Constitutional: Alert and oriented. Well appearing and in no acute distress. Eyes: Conjunctivae are normal. PERRL. EOMI. Head: Atraumatic. Nose: No congestion/rhinnorhea. Mouth/Throat: Mucous membranes are moist.  Oropharynx non-erythematous. Neck: No stridor.  No meningeal signs.   Cardiovascular: Normal rate, regular rhythm. Good peripheral circulation. Grossly normal heart sounds.   Respiratory: Normal respiratory effort.  No retractions. Lungs CTAB. Gastrointestinal: Soft and nontender. No distention.  Musculoskeletal: Tenderness to right patella.  Able to move fully. Neurologic:  Normal speech and language. No gross focal neurologic deficits are appreciated.  Skin:  Skin is warm, dry and intact. No rash noted.  Erythema, swelling and tenderness with mild induration but no fluctuance over her left dorsum of her foot.  Also has some fungal looking rash between some of her toes.   ____________________________________________    RADIOLOGY  Dg Knee Complete 4 Views Right  Result Date: 05/11/2018 CLINICAL DATA:  Status post fall today with pain of the right knee. EXAM: RIGHT KNEE - COMPLETE 4+ VIEW COMPARISON:  September 13, 2016 FINDINGS: No evidence of fracture, dislocation. Degenerative joint changes with narrowed joint space and osteophyte formation are noted. Soft tissues are unremarkable. IMPRESSION: No acute fracture or dislocation. Electronically Signed   By: Sherian Rein M.D.   On: 05/11/2018 14:08    ____________________________________________    INITIAL IMPRESSION / ASSESSMENT AND PLAN / ED COURSE  X-ray normal.  Patient able to ambulate on it without difficulty with a walker which is her baseline.  Will treat the erythema on her foot as likely early cellulitis as it is warm and red and tender.   She has no history of neuropathy but will keep a close eye on it.  We will not address the low oxygen at this point as it seems to be chronic and she is here to get multiple follow-up appointments for it and is being worked on at home.  Pain controlled stable for discharge.  Pertinent labs & imaging results that were available during my care of the patient were reviewed by me and considered in my medical decision making (see chart for details).  ____________________________________________  FINAL CLINICAL IMPRESSION(S) / ED DIAGNOSES  Final diagnoses:  Acute pain of right knee     MEDICATIONS GIVEN DURING THIS VISIT:  Medications  ipratropium-albuterol (DUONEB) 0.5-2.5 (3) MG/3ML nebulizer solution 3 mL (3 mLs Nebulization Given 05/11/18 1942)  HYDROcodone-acetaminophen (NORCO/VICODIN) 5-325 MG per tablet 1 tablet (1 tablet Oral Given 05/11/18 1714)  ibuprofen (ADVIL,MOTRIN) tablet 400 mg (400 mg Oral Given 05/11/18 1714)  cephALEXin (KEFLEX) capsule 500 mg (500 mg Oral Given 05/11/18 1714)     NEW OUTPATIENT MEDICATIONS STARTED DURING THIS VISIT:  Discharge Medication List as of 05/11/2018  8:43 PM    START taking these medications   Details  cephALEXin (KEFLEX) 500 MG capsule Take 1 capsule (500 mg total) by mouth 4 (four) times daily., Starting Wed 05/11/2018, Print    clotrimazole (LOTRIMIN) 1 % cream Apply to affected area 2 times daily, Print    HYDROcodone-acetaminophen (NORCO/VICODIN) 5-325 MG tablet Take 1 tablet by mouth every 6 (six) hours as needed for severe pain., Starting Wed 05/11/2018, Print    ibuprofen (ADVIL,MOTRIN) 400 MG tablet Take 1 tablet (400 mg total) by mouth every 6 (six) hours as needed., Starting Wed 05/11/2018, Print        Note:  This note was prepared with assistance of Dragon voice recognition software. Occasional wrong-word or sound-a-like substitutions may have occurred due to the inherent limitations of voice recognition software.   Marily MemosMesner,  [place] Pulse, MD 05/11/18 2105

## 2018-05-11 NOTE — Patient Outreach (Signed)
[facility]) Care Management  05/11/2018  TRISTEN PENNINO 03-04-1951 [id number]   CSW was able to make contact with patient's daughter, Ardelle Haliburton today to follow-up regarding long-term care placement arrangements for patient in an assisted living facility.  CSW explained to Ms. Regnier that CSW was able to fax patient's FL-2 Form to all assisted living facilities of choice, which included all of the following: [facility] Ms. Minami indicated that she has already received a call from [facility], first facility of choice, with regards to a bed offer for patient.  However, Ms. Spurrier is now saying that she would like patient placed in a skilled nursing facility.  CSW explained to Ms. Haeberle that patient does not currently meet criteria for skilled care.  Ms. Mainer reported that she plans to speak with patient's Primary Care Physician, Dr. Clayborn Bigness to see about getting patient's level of care changed from assisted living to skilled nursing.  CSW voiced understanding, encouraging Ms. Weisheit to follow-up with CSW as soon as she has received a decision from Dr. Humphrey Rolls.  CSW further explained to Ms. Touch that CSW received a call from patient's Ronald Worker with [facility] [phone] ext. # [phone]), requesting an update on placement for patient.  CSW explained to Mrs. Tori that [facility] has faxed patient's FL-2 Form to all assisted living facilities of choice and that a bed offer has been received from [facility] facility.  Ms. Pixley admitted that she was not familiar with Mrs. Tori, nor has she ever spoken with her regarding placement for patient.  CSW provided Ms. Faulk with Mrs. Tori's contact information, encouraging her to give Mrs. Tori a call.  CSW encouraged Ms. Mcgrady to go ahead and apply for  Long-Term Care Medicaid for patient, through the [place], as Ms. Messenger has already indicated that patient will be unable to private pay at an assisted living level or a skilled nursing level.  Ms. Shambaugh reported that she would like to get patient's level of care change before moving forward with placement arrangements for patient. CSW will await a return call from Ms. Avans.  CSW will make arrangements to outreach to Ms. Hally again within the next week, if a return call is not received in the meantime.  THN CM Care Plan Problem One     Most Recent Value  Care Plan Problem One  Lacks caregiver support to assist with activities of daily living.  Role Documenting the Problem One  Clinical Social Worker  Care Plan for Problem One  Active  [facility] Long Term Goal   Patient will receive 24 hour care and supervision in an assisted living facility within the next 45 days.  THN Long Term Goal Start Date  04/12/18  [facility] CM Short Term Goal #1   Patient will review list of assisted living facilities provided to her by CSW and decide on several facilities of choice, within the next two weeks.  THN CM Short Term Goal #1 Start Date  04/12/18  [facility] CM Short Term Goal #1 Met Date  04/21/18  THN CM Short Term Goal #2   CSW and patient will contact patient's primary care physician to request a completed and signed FL-2 Form, necessary for assisted living placement, within the next two weeks.  THN CM Short Term Goal #2 Start Date  04/12/18  [facility]N CM Short Term Goal #2 Met Date  04/19/18  [facility]N CM Short Term Goal #3  Patient will contact her primary care physician to schedule a routine visit to have FL-2 Form initiated, as well as a TB Skin Test, both necessary for assisted living placement, within the next two weeks.  THN CM Short Term Goal #3 Start Date  04/12/18  [facility] CM Short Term Goal #3 Met Date  05/03/18  THN CM Short Term Goal #4  Patient's daughter will contact patient's  primary care physician to discuss level of care concerns, within the next week.  THN CM Short Term Goal #4 Start Date  05/11/18  Interventions for Short Term Goal #4  Patient's daughter is now requesting a higher level of care for patient and has been encouraged to speak with patient's primary care physician to see aboout having patient's level of care change from assisted living to skilled nursing.    THN CM Care Plan Problem Two     Most Recent Value  Care Plan Problem Two  Lack of financial resources to pay for assisted living facility.  Role Documenting the Problem Two  Clinical Social Worker  Care Plan for Problem Two  Active  THN CM Short Term Goal #1   Patient's daughter has agreed to go to the [place] to apply for [employer], within the next week.  THN CM Short Term Goal #1 Start Date  04/28/18     Nat Christen, BSW, MSW, [place]  Licensed Clinical Social Worker  [place]  Mailing [place]. [address], [place], [place] [address] Physical Address-[address], [place], [place] [address] Toll Free Main # [phone] Fax # [phone] Cell # [phone]  Office # [phone] [email or web address]

## 2018-05-12 DIAGNOSIS — E119 Type 2 diabetes mellitus without complications: Secondary | ICD-10-CM | POA: Diagnosis not present

## 2018-05-12 DIAGNOSIS — G4733 Obstructive sleep apnea (adult) (pediatric): Secondary | ICD-10-CM | POA: Diagnosis not present

## 2018-05-12 DIAGNOSIS — J441 Chronic obstructive pulmonary disease with (acute) exacerbation: Secondary | ICD-10-CM | POA: Diagnosis not present

## 2018-05-12 DIAGNOSIS — F17210 Nicotine dependence, cigarettes, uncomplicated: Secondary | ICD-10-CM | POA: Diagnosis not present

## 2018-05-12 DIAGNOSIS — I5033 Acute on chronic diastolic (congestive) heart failure: Secondary | ICD-10-CM | POA: Diagnosis not present

## 2018-05-12 DIAGNOSIS — M1990 Unspecified osteoarthritis, unspecified site: Secondary | ICD-10-CM | POA: Diagnosis not present

## 2018-05-12 DIAGNOSIS — I110 Hypertensive heart disease with heart failure: Secondary | ICD-10-CM | POA: Diagnosis not present

## 2018-05-12 DIAGNOSIS — J9601 Acute respiratory failure with hypoxia: Secondary | ICD-10-CM | POA: Diagnosis not present

## 2018-05-16 ENCOUNTER — Encounter: Payer: Self-pay | Admitting: *Deleted

## 2018-05-16 ENCOUNTER — Other Ambulatory Visit: Payer: Self-pay | Admitting: *Deleted

## 2018-05-16 DIAGNOSIS — G4733 Obstructive sleep apnea (adult) (pediatric): Secondary | ICD-10-CM

## 2018-05-16 NOTE — Patient Outreach (Signed)
[facility](THN) Care Management  05/16/2018  Karen PolingLinda J Perez 01/26/1951 [id number]   CSW made an attempt to try and contact patient's daughter, Karen Perez today to follow-up regarding long-term care placement arrangements for patient; however, Ms. Karen Perez was not available at the time of CSW's call.  A HIPAA compliant message was left for Ms. Karen Perez on voicemail and CSW is currently awaiting a return call.  CSW will make a second outreach attempt within the next 3-4 business days, if a return call is not received from Ms. Karen Perez in the meantime.  CSW will also mail an outreach letter to patient's home, requesting that a return call be received if patient and Ms. Karen Perez wish to continue to receive social work services and resources by CSW with [facility] Care [facility]. Karen Perez, BSW, MSW, LCSW  Licensed Restaurant manager, fast foodClinical Social Worker  [facility] Care Management [place] [facility]  [address]ailing [place], [place], [place] [address] Physical Address-[address], [place], [place] [address] Toll Free Main # [phone] Fax # [phone] Cell # [phone]  Office # [phone] [email or web address]

## 2018-05-17 ENCOUNTER — Telehealth: Payer: Self-pay

## 2018-05-17 ENCOUNTER — Other Ambulatory Visit: Payer: Self-pay | Admitting: Pharmacist

## 2018-05-17 ENCOUNTER — Telehealth: Payer: Self-pay | Admitting: Internal Medicine

## 2018-05-17 DIAGNOSIS — E119 Type 2 diabetes mellitus without complications: Secondary | ICD-10-CM | POA: Diagnosis not present

## 2018-05-17 DIAGNOSIS — J9601 Acute respiratory failure with hypoxia: Secondary | ICD-10-CM | POA: Diagnosis not present

## 2018-05-17 DIAGNOSIS — G4733 Obstructive sleep apnea (adult) (pediatric): Secondary | ICD-10-CM | POA: Diagnosis not present

## 2018-05-17 DIAGNOSIS — I5033 Acute on chronic diastolic (congestive) heart failure: Secondary | ICD-10-CM | POA: Diagnosis not present

## 2018-05-17 DIAGNOSIS — M1990 Unspecified osteoarthritis, unspecified site: Secondary | ICD-10-CM | POA: Diagnosis not present

## 2018-05-17 DIAGNOSIS — J441 Chronic obstructive pulmonary disease with (acute) exacerbation: Secondary | ICD-10-CM | POA: Diagnosis not present

## 2018-05-17 DIAGNOSIS — I110 Hypertensive heart disease with heart failure: Secondary | ICD-10-CM | POA: Diagnosis not present

## 2018-05-17 DIAGNOSIS — F17210 Nicotine dependence, cigarettes, uncomplicated: Secondary | ICD-10-CM | POA: Diagnosis not present

## 2018-05-17 NOTE — Telephone Encounter (Signed)
[facility] called regarding ms [place] for pt for patient , gave the verbal orders for physical therapy twice weekly for 3 weeks .

## 2018-05-17 NOTE — Telephone Encounter (Signed)
GAVE VERBAL ORDER TO [facility] [phone])  FOR PT EVALUATION AS PER HEATHER

## 2018-05-17 NOTE — Patient Outreach (Signed)
[facility](THN) Care Management  05/17/2018  Karen PolingLinda J Perez 08/16/1951 [id number]  2nd home visit scheduled with Ms. Karen Perez today to review medications as patient was a "no show" for initial home visit.  Today, patient was again not at home for appointment.  Her son-in-law reported that [place]. I left a handout with a list of patient's most current medication list per [facility] with son-in-law per request.  I left a voicemail with patient's daughter to review plan for patient moving foward.    3:06PM Incoming call received from patient's daughter. HIPAA identifiers verified. Daughter states that patient has memory issues and does not think that a home visit will be helpful in the future.  She is appreciative of the medication list and for [facility] services but declines further [facility] intervention at this time.  I provided my phone number in case patient's daughter would like to reach out to me in the future.    Plan: I will close [facility] at this time.   Karen Perez, PharmD, [facility] Clinical Pharmacist [facility] [phone]

## 2018-05-17 NOTE — Telephone Encounter (Signed)
GAVE VERBAL ORDER TO [facility] FOR SKILLED NURSING AND PT EVALUATE AS PER HEATHER IS OK

## 2018-05-18 ENCOUNTER — Telehealth: Payer: Self-pay | Admitting: Pulmonary Disease

## 2018-05-18 DIAGNOSIS — G4733 Obstructive sleep apnea (adult) (pediatric): Secondary | ICD-10-CM | POA: Diagnosis not present

## 2018-05-18 NOTE — Telephone Encounter (Signed)
HST 05/12/18 >> AHI 13.3, SaO2 low 65%.  Spent 223 min with SaO2 < 90%.   Will have my nurse inform pt that sleep study shows mild sleep apnea, and oxygen level was very low during the test.  If patient is agreeable, then please arrange for in lab CPAP titration study to determine if CPAP by itself is sufficient or if she needs supplemental oxygen at night with CPAP.

## 2018-05-20 ENCOUNTER — Other Ambulatory Visit: Payer: Self-pay | Admitting: Internal Medicine

## 2018-05-20 ENCOUNTER — Other Ambulatory Visit: Payer: Self-pay | Admitting: *Deleted

## 2018-05-20 DIAGNOSIS — I110 Hypertensive heart disease with heart failure: Secondary | ICD-10-CM | POA: Diagnosis not present

## 2018-05-20 DIAGNOSIS — M1990 Unspecified osteoarthritis, unspecified site: Secondary | ICD-10-CM | POA: Diagnosis not present

## 2018-05-20 DIAGNOSIS — F17210 Nicotine dependence, cigarettes, uncomplicated: Secondary | ICD-10-CM | POA: Diagnosis not present

## 2018-05-20 DIAGNOSIS — E119 Type 2 diabetes mellitus without complications: Secondary | ICD-10-CM | POA: Diagnosis not present

## 2018-05-20 DIAGNOSIS — G4733 Obstructive sleep apnea (adult) (pediatric): Secondary | ICD-10-CM | POA: Diagnosis not present

## 2018-05-20 DIAGNOSIS — J441 Chronic obstructive pulmonary disease with (acute) exacerbation: Secondary | ICD-10-CM | POA: Diagnosis not present

## 2018-05-20 DIAGNOSIS — J9601 Acute respiratory failure with hypoxia: Secondary | ICD-10-CM | POA: Diagnosis not present

## 2018-05-20 DIAGNOSIS — I5033 Acute on chronic diastolic (congestive) heart failure: Secondary | ICD-10-CM | POA: Diagnosis not present

## 2018-05-20 NOTE — Patient Outreach (Signed)
[facility](THN) Care Management  05/20/2018  Karen Perez 03/29/1951 [id number]   Call placed to member to follow up on current health condition and attempt to schedule home visit.  No answer, HIPAA compliant voice message left.  Unsuccessful outreach letter sent to member on 7/3 by LCSW, will follow up within the next 4 business days.  Karen Perez, CaliforniaRN, MSN [facility] Care Management  [facility] Care Manager [phone]

## 2018-05-20 NOTE — Telephone Encounter (Signed)
Attempted to call patient today regarding results. I did not receive an answer at time of call. I have left a voicemail message for pt to return call. X1  

## 2018-05-20 NOTE — Patient Outreach (Signed)
[facility](THN) Care Management  05/20/2018  Karen PolingLinda J Perez 04/09/1951 [id number]   CSW made a second attempt to try and contact patient and patient's daughter, Karen Perez today to follow-up regarding long-term care placement arrangements for patient, without success.  A HIPAA compliant message was left for patient and Ms. Karen Perez on voicemail.  CSW continues to await a return call.  CSW will make a third and final outreach attempt within the next 3-4 business days, if a return call is not received from patient and/or Ms. Karen Perez in the meantime.  CSW will then proceed with case closure if a return call is not received from patient and/or Ms. Karen Perez with a total of 10 business days, as required number of phone attempts will have been made and outreach letter mailed.  Danford BadJoanna Saporito, BSW, MSW, LCSW  Licensed Restaurant manager, fast foodClinical Social Worker  [facility] Care Management [name]  Mailing [place], [place], [place] [address] Physical Address-[address], [place], [place] [address] Toll Free Main # [phone] Fax # [phone] Cell # [phone]  Office # [phone] [name]

## 2018-05-23 DIAGNOSIS — I5033 Acute on chronic diastolic (congestive) heart failure: Secondary | ICD-10-CM | POA: Diagnosis not present

## 2018-05-23 DIAGNOSIS — E119 Type 2 diabetes mellitus without complications: Secondary | ICD-10-CM | POA: Diagnosis not present

## 2018-05-23 DIAGNOSIS — G4733 Obstructive sleep apnea (adult) (pediatric): Secondary | ICD-10-CM | POA: Diagnosis not present

## 2018-05-23 DIAGNOSIS — J441 Chronic obstructive pulmonary disease with (acute) exacerbation: Secondary | ICD-10-CM | POA: Diagnosis not present

## 2018-05-23 DIAGNOSIS — J9601 Acute respiratory failure with hypoxia: Secondary | ICD-10-CM | POA: Diagnosis not present

## 2018-05-23 DIAGNOSIS — I110 Hypertensive heart disease with heart failure: Secondary | ICD-10-CM | POA: Diagnosis not present

## 2018-05-23 DIAGNOSIS — F17210 Nicotine dependence, cigarettes, uncomplicated: Secondary | ICD-10-CM | POA: Diagnosis not present

## 2018-05-23 DIAGNOSIS — M1990 Unspecified osteoarthritis, unspecified site: Secondary | ICD-10-CM | POA: Diagnosis not present

## 2018-05-24 NOTE — Telephone Encounter (Signed)
Attempted to call patient today regarding results. I did not receive an answer at time of call. I have left a voicemail message for pt to return call. X2  

## 2018-05-25 ENCOUNTER — Other Ambulatory Visit: Payer: Self-pay | Admitting: *Deleted

## 2018-05-25 NOTE — Telephone Encounter (Signed)
Attempted to call patient today regarding results. I did not receive an answer at time of call. I have left a voicemail message for pt to return call. X3 Mailed out letter for pt to contact our office to return call for results.

## 2018-05-25 NOTE — Patient Outreach (Addendum)
[facility](THN) Care Management  05/25/2018  Karen PolingLinda J Perez 04/10/1951 [id number]   Call placed to member to follow up on current health condition, no answer.  HIPAA compliant voice message left.  Call then placed to member's daughter (mobile phone), no answer.  HIPAA compliant voice message left.  Will await call back, if no call back will follow up within the next 4 business days.    Update @ 1030:  Call received back from daughter stating that she was on vacation and the member was currently out of town at a family member's home.  She state she will contact this care manager once they are both back at the home to schedule follow up visit with this care manager as well as LCSW.  Karen Perez, [place]RN, MSN [facility] Care Management  [facility] Care Manager [phone]

## 2018-05-26 ENCOUNTER — Other Ambulatory Visit: Payer: Self-pay | Admitting: *Deleted

## 2018-05-26 NOTE — Patient Outreach (Signed)
[facility](THN) Care Management  05/26/2018  Karen PolingLinda J Perez 09/03/1951 [id number]    CSW made a third and final attempt to try and contact patient's daughter, Karen Perez today to follow-up regarding social work services and resources for patient, without success.  A HIPAA compliant message was left for Ms. Karen Perez on voicemail.  CSW is currently awaiting a return call.  CSW will proceed with case closure in two business days, if a return call is not received in the meantime, as required number of phone attempts have been made and an outreach letter was mailed to patient's home allowing 10 business days for a response. Karen BadJoanna Ygnacio Perez, BSW, MSW, LCSW  Licensed Restaurant manager, fast foodClinical Social Worker  [facility] Care Management [place] [facility]  Mailing [place]Address[address], [place], [place] [address] Physical Address-[address], [place], [place] [address] Toll Free Main # [phone] Fax # [phone] Cell # [phone]  Office # [phone] [email or web address]

## 2018-05-27 DIAGNOSIS — I110 Hypertensive heart disease with heart failure: Secondary | ICD-10-CM | POA: Diagnosis not present

## 2018-05-27 DIAGNOSIS — J9601 Acute respiratory failure with hypoxia: Secondary | ICD-10-CM | POA: Diagnosis not present

## 2018-05-27 DIAGNOSIS — J441 Chronic obstructive pulmonary disease with (acute) exacerbation: Secondary | ICD-10-CM | POA: Diagnosis not present

## 2018-05-27 DIAGNOSIS — F17210 Nicotine dependence, cigarettes, uncomplicated: Secondary | ICD-10-CM | POA: Diagnosis not present

## 2018-05-27 DIAGNOSIS — E119 Type 2 diabetes mellitus without complications: Secondary | ICD-10-CM | POA: Diagnosis not present

## 2018-05-27 DIAGNOSIS — M1990 Unspecified osteoarthritis, unspecified site: Secondary | ICD-10-CM | POA: Diagnosis not present

## 2018-05-27 DIAGNOSIS — G4733 Obstructive sleep apnea (adult) (pediatric): Secondary | ICD-10-CM | POA: Diagnosis not present

## 2018-05-27 DIAGNOSIS — I5033 Acute on chronic diastolic (congestive) heart failure: Secondary | ICD-10-CM | POA: Diagnosis not present

## 2018-05-30 ENCOUNTER — Other Ambulatory Visit: Payer: Self-pay | Admitting: *Deleted

## 2018-05-30 ENCOUNTER — Telehealth: Payer: Self-pay | Admitting: Pulmonary Disease

## 2018-05-30 ENCOUNTER — Encounter: Payer: Self-pay | Admitting: *Deleted

## 2018-05-30 NOTE — Telephone Encounter (Signed)
HST 05/12/18 >> AHI 13.3, SaO2 low 65%.  Spent 223 min with SaO2 < 90%.   Will have my nurse inform pt that sleep study shows mild sleep apnea, and oxygen level was very low during the test.  If patient is agreeable, then please arrange for in lab CPAP titration study to determine if CPAP by itself is sufficient or if she needs supplemental oxygen at night with CPAP.  Called pt but unable to reach pt and unable to leave a VM due to mailbox not being set up. Will try to call back later.

## 2018-05-30 NOTE — Telephone Encounter (Signed)
ATC Patient.  No answer and VM is full.  Will try again at a later time. 

## 2018-05-30 NOTE — Patient Outreach (Signed)
[facility](THN) Care Management  05/30/2018  Karen Perez 07/26/1951 [id number]   CSW will perform a case closure on patient, due to inability to maintain phone contact, despite required number of phone attempts made and an Outreach Letter mailed to patient's home encouraging patient to contact CSW directly if she is interested in continuing to receive social work services through CSW with [facility] Care Management.  CSW will notify patient's RNCM with [facility] Care Management, Kemper DurieMonica Lane of CSW's plans to close patient's case.  CSW will fax an update to patient's Primary Care Physician, Dr. Beverely RisenFozia Khan to ensure that they are aware of CSW's involvement with patient's plan of care.   Danford BadJoanna Saporito, BSW, MSW, LCSW  Licensed Restaurant manager, fast foodClinical Social Worker  [facility] Care Management [place] [facility]  Mailing [place]Address[address], [place], [place] [address] Physical Address-[address], [place], [place] [address] Toll Free Main # [phone] Fax # [phone] Cell # [phone]  Office # [phone] Mardene [email or web address]

## 2018-05-30 NOTE — Telephone Encounter (Signed)
Attempted to try to call pt again but still unable to speak with pt and still unable to leave a message due to VM not set up.  Will try to call back later.

## 2018-05-30 NOTE — Telephone Encounter (Signed)
Patient returning call - She can be reached at [phone]

## 2018-05-30 NOTE — Telephone Encounter (Signed)
Pt is calling back [phone]

## 2018-05-31 ENCOUNTER — Other Ambulatory Visit: Payer: Self-pay | Admitting: *Deleted

## 2018-05-31 NOTE — Telephone Encounter (Signed)
Attempted to contact pt. I did not receive an answer. There was no option for me to leave a message. Will try back.  

## 2018-05-31 NOTE — Patient Outreach (Signed)
[facility](THN) Care Management  05/31/2018  Karen PolingLinda J Perez 01/24/1951 [id number]   Call placed to member to follow up on current health status and schedule home visit, no answer.  Unable to leave a message.  Call then placed to daughter, no answer.  HIPAA compliant voice message left.  Will follow up within the next 4 business days.   Karen Perez, [place]RN, MSN [facility] Care Management  [facility] Care Manager [phone]

## 2018-06-01 NOTE — Telephone Encounter (Signed)
Called pt's home number- no VM set up Attempted to call patient today on mobile phone. I did not receive an answer at time of call. I have left a voicemail message for pt to return call. X1

## 2018-06-02 DIAGNOSIS — I5033 Acute on chronic diastolic (congestive) heart failure: Secondary | ICD-10-CM | POA: Diagnosis not present

## 2018-06-02 DIAGNOSIS — M1990 Unspecified osteoarthritis, unspecified site: Secondary | ICD-10-CM | POA: Diagnosis not present

## 2018-06-02 DIAGNOSIS — F17210 Nicotine dependence, cigarettes, uncomplicated: Secondary | ICD-10-CM | POA: Diagnosis not present

## 2018-06-02 DIAGNOSIS — J9601 Acute respiratory failure with hypoxia: Secondary | ICD-10-CM | POA: Diagnosis not present

## 2018-06-02 DIAGNOSIS — E119 Type 2 diabetes mellitus without complications: Secondary | ICD-10-CM | POA: Diagnosis not present

## 2018-06-02 DIAGNOSIS — I110 Hypertensive heart disease with heart failure: Secondary | ICD-10-CM | POA: Diagnosis not present

## 2018-06-02 DIAGNOSIS — J441 Chronic obstructive pulmonary disease with (acute) exacerbation: Secondary | ICD-10-CM | POA: Diagnosis not present

## 2018-06-02 DIAGNOSIS — G4733 Obstructive sleep apnea (adult) (pediatric): Secondary | ICD-10-CM | POA: Diagnosis not present

## 2018-06-03 ENCOUNTER — Other Ambulatory Visit: Payer: Self-pay | Admitting: *Deleted

## 2018-06-03 NOTE — Patient Outreach (Signed)
[facility](THN) Care Management  06/03/2018  Karen Perez 01/11/1951 [id number]   Call placed to member to follow up on current health status, no answer.  Unable to leave a message.  This care manager has not been able to maintain contact with member nor with her daughter.  Also noted that MD office has been trying to make contact without success.  Will await call back, if no call back by 7/24, will close case.    Kemper DurieMonica Amiliah Campisi, [place]RN, MSN [facility] Care Management  [facility] Care Manager [phone]

## 2018-06-03 NOTE — Telephone Encounter (Signed)
Attempted to call Patient. Unable to leave message, VM full.  Will try at a later time.

## 2018-06-05 DIAGNOSIS — B9689 Other specified bacterial agents as the cause of diseases classified elsewhere: Secondary | ICD-10-CM | POA: Diagnosis not present

## 2018-06-05 DIAGNOSIS — R2681 Unsteadiness on feet: Secondary | ICD-10-CM | POA: Diagnosis not present

## 2018-06-05 DIAGNOSIS — R0602 Shortness of breath: Secondary | ICD-10-CM | POA: Diagnosis not present

## 2018-06-05 DIAGNOSIS — R0902 Hypoxemia: Secondary | ICD-10-CM | POA: Diagnosis not present

## 2018-06-05 DIAGNOSIS — E0800 Diabetes mellitus due to underlying condition with hyperosmolarity without nonketotic hyperglycemic-hyperosmolar coma (NKHHC): Secondary | ICD-10-CM | POA: Diagnosis not present

## 2018-06-05 DIAGNOSIS — N3000 Acute cystitis without hematuria: Secondary | ICD-10-CM | POA: Diagnosis not present

## 2018-06-05 DIAGNOSIS — R000 Tachycardia, unspecified: Secondary | ICD-10-CM | POA: Diagnosis not present

## 2018-06-05 DIAGNOSIS — R2689 Other abnormalities of gait and mobility: Secondary | ICD-10-CM | POA: Diagnosis not present

## 2018-06-05 DIAGNOSIS — I368 Other nonrheumatic tricuspid valve disorders: Secondary | ICD-10-CM | POA: Diagnosis not present

## 2018-06-05 DIAGNOSIS — R05 Cough: Secondary | ICD-10-CM | POA: Diagnosis not present

## 2018-06-05 DIAGNOSIS — R52 Pain, unspecified: Secondary | ICD-10-CM | POA: Diagnosis not present

## 2018-06-05 DIAGNOSIS — R410 Disorientation, unspecified: Secondary | ICD-10-CM | POA: Diagnosis not present

## 2018-06-05 DIAGNOSIS — E876 Hypokalemia: Secondary | ICD-10-CM | POA: Diagnosis not present

## 2018-06-05 DIAGNOSIS — I110 Hypertensive heart disease with heart failure: Secondary | ICD-10-CM | POA: Diagnosis not present

## 2018-06-05 DIAGNOSIS — B9620 Unspecified Escherichia coli [E. coli] as the cause of diseases classified elsewhere: Secondary | ICD-10-CM | POA: Diagnosis not present

## 2018-06-05 DIAGNOSIS — I5030 Unspecified diastolic (congestive) heart failure: Secondary | ICD-10-CM | POA: Diagnosis not present

## 2018-06-05 DIAGNOSIS — Z720 Tobacco use: Secondary | ICD-10-CM | POA: Diagnosis not present

## 2018-06-05 DIAGNOSIS — J4520 Mild intermittent asthma, uncomplicated: Secondary | ICD-10-CM | POA: Diagnosis not present

## 2018-06-05 DIAGNOSIS — W19XXXA Unspecified fall, initial encounter: Secondary | ICD-10-CM | POA: Diagnosis not present

## 2018-06-05 DIAGNOSIS — E785 Hyperlipidemia, unspecified: Secondary | ICD-10-CM | POA: Diagnosis not present

## 2018-06-05 DIAGNOSIS — N390 Urinary tract infection, site not specified: Secondary | ICD-10-CM | POA: Diagnosis not present

## 2018-06-05 DIAGNOSIS — I10 Essential (primary) hypertension: Secondary | ICD-10-CM | POA: Diagnosis not present

## 2018-06-05 DIAGNOSIS — K219 Gastro-esophageal reflux disease without esophagitis: Secondary | ICD-10-CM | POA: Diagnosis not present

## 2018-06-05 DIAGNOSIS — J9601 Acute respiratory failure with hypoxia: Secondary | ICD-10-CM | POA: Diagnosis not present

## 2018-06-05 DIAGNOSIS — J9811 Atelectasis: Secondary | ICD-10-CM | POA: Diagnosis not present

## 2018-06-05 DIAGNOSIS — R0689 Other abnormalities of breathing: Secondary | ICD-10-CM | POA: Diagnosis not present

## 2018-06-05 DIAGNOSIS — E1165 Type 2 diabetes mellitus with hyperglycemia: Secondary | ICD-10-CM | POA: Diagnosis not present

## 2018-06-05 DIAGNOSIS — Z5189 Encounter for other specified aftercare: Secondary | ICD-10-CM | POA: Diagnosis not present

## 2018-06-05 DIAGNOSIS — R7881 Bacteremia: Secondary | ICD-10-CM | POA: Diagnosis not present

## 2018-06-05 DIAGNOSIS — F17210 Nicotine dependence, cigarettes, uncomplicated: Secondary | ICD-10-CM | POA: Diagnosis not present

## 2018-06-05 DIAGNOSIS — M6281 Muscle weakness (generalized): Secondary | ICD-10-CM | POA: Diagnosis not present

## 2018-06-05 DIAGNOSIS — E119 Type 2 diabetes mellitus without complications: Secondary | ICD-10-CM | POA: Diagnosis not present

## 2018-06-05 DIAGNOSIS — J441 Chronic obstructive pulmonary disease with (acute) exacerbation: Secondary | ICD-10-CM | POA: Diagnosis not present

## 2018-06-05 DIAGNOSIS — I5032 Chronic diastolic (congestive) heart failure: Secondary | ICD-10-CM | POA: Diagnosis not present

## 2018-06-07 NOTE — Telephone Encounter (Signed)
ATC VM not set up at this time-will need to try again later. If no answer then mail letter to patient's home address.

## 2018-06-07 NOTE — Telephone Encounter (Signed)
Pt has an upcoming appt on 07/06/18. Provider will evaluate then.

## 2018-06-09 ENCOUNTER — Other Ambulatory Visit: Payer: Self-pay | Admitting: *Deleted

## 2018-06-09 NOTE — Telephone Encounter (Signed)
Called pt's home number- no VM set up Attempted to call patient today on mobile phone. I did not receive an answer at time of call. I have left a voicemail message forptto return call. X4 Mailed pt letter in mail today needing to contact office. Nothing further needed at this time.

## 2018-06-09 NOTE — Patient Outreach (Signed)
[facility](THN) Care Management  06/09/2018  Jackelyn PolingLinda J Therrell 05/08/1951 [id number]   No contact from member or daughter after multiple unsuccessful outreach attempts.  Will close case due to inability to maintain contact.  Will notify MD of case closure.  Kemper DurieMonica Rhegan Trunnell, [place]RN, MSN [facility] Care Management  [facility] Care Manager [phone]

## 2018-06-11 DIAGNOSIS — Z87891 Personal history of nicotine dependence: Secondary | ICD-10-CM | POA: Diagnosis not present

## 2018-06-11 DIAGNOSIS — Z7984 Long term (current) use of oral hypoglycemic drugs: Secondary | ICD-10-CM | POA: Diagnosis not present

## 2018-06-11 DIAGNOSIS — K219 Gastro-esophageal reflux disease without esophagitis: Secondary | ICD-10-CM | POA: Diagnosis not present

## 2018-06-11 DIAGNOSIS — R2689 Other abnormalities of gait and mobility: Secondary | ICD-10-CM | POA: Diagnosis not present

## 2018-06-11 DIAGNOSIS — I509 Heart failure, unspecified: Secondary | ICD-10-CM | POA: Diagnosis not present

## 2018-06-11 DIAGNOSIS — Z880 Allergy status to penicillin: Secondary | ICD-10-CM | POA: Diagnosis not present

## 2018-06-11 DIAGNOSIS — Z79899 Other long term (current) drug therapy: Secondary | ICD-10-CM | POA: Diagnosis not present

## 2018-06-11 DIAGNOSIS — I110 Hypertensive heart disease with heart failure: Secondary | ICD-10-CM | POA: Diagnosis not present

## 2018-06-11 DIAGNOSIS — J9601 Acute respiratory failure with hypoxia: Secondary | ICD-10-CM | POA: Diagnosis not present

## 2018-06-11 DIAGNOSIS — E785 Hyperlipidemia, unspecified: Secondary | ICD-10-CM | POA: Diagnosis not present

## 2018-06-11 DIAGNOSIS — I10 Essential (primary) hypertension: Secondary | ICD-10-CM | POA: Diagnosis not present

## 2018-06-11 DIAGNOSIS — R2681 Unsteadiness on feet: Secondary | ICD-10-CM | POA: Diagnosis not present

## 2018-06-11 DIAGNOSIS — M25569 Pain in unspecified knee: Secondary | ICD-10-CM | POA: Diagnosis not present

## 2018-06-11 DIAGNOSIS — E119 Type 2 diabetes mellitus without complications: Secondary | ICD-10-CM | POA: Diagnosis not present

## 2018-06-11 DIAGNOSIS — G4733 Obstructive sleep apnea (adult) (pediatric): Secondary | ICD-10-CM | POA: Diagnosis not present

## 2018-06-11 DIAGNOSIS — N3000 Acute cystitis without hematuria: Secondary | ICD-10-CM | POA: Diagnosis not present

## 2018-06-11 DIAGNOSIS — E1165 Type 2 diabetes mellitus with hyperglycemia: Secondary | ICD-10-CM | POA: Diagnosis not present

## 2018-06-11 DIAGNOSIS — J449 Chronic obstructive pulmonary disease, unspecified: Secondary | ICD-10-CM | POA: Diagnosis not present

## 2018-06-11 DIAGNOSIS — R0602 Shortness of breath: Secondary | ICD-10-CM | POA: Diagnosis not present

## 2018-06-11 DIAGNOSIS — J441 Chronic obstructive pulmonary disease with (acute) exacerbation: Secondary | ICD-10-CM | POA: Diagnosis not present

## 2018-06-11 DIAGNOSIS — M6281 Muscle weakness (generalized): Secondary | ICD-10-CM | POA: Diagnosis not present

## 2018-06-11 DIAGNOSIS — Z5189 Encounter for other specified aftercare: Secondary | ICD-10-CM | POA: Diagnosis not present

## 2018-06-11 DIAGNOSIS — B9620 Unspecified Escherichia coli [E. coli] as the cause of diseases classified elsewhere: Secondary | ICD-10-CM | POA: Diagnosis not present

## 2018-06-11 DIAGNOSIS — F17210 Nicotine dependence, cigarettes, uncomplicated: Secondary | ICD-10-CM | POA: Diagnosis not present

## 2018-06-11 DIAGNOSIS — I5032 Chronic diastolic (congestive) heart failure: Secondary | ICD-10-CM | POA: Diagnosis not present

## 2018-06-11 DIAGNOSIS — R7881 Bacteremia: Secondary | ICD-10-CM | POA: Diagnosis not present

## 2018-06-11 DIAGNOSIS — Z8673 Personal history of transient ischemic attack (TIA), and cerebral infarction without residual deficits: Secondary | ICD-10-CM | POA: Diagnosis not present

## 2018-06-11 DIAGNOSIS — E876 Hypokalemia: Secondary | ICD-10-CM | POA: Diagnosis not present

## 2018-06-11 DIAGNOSIS — R269 Unspecified abnormalities of gait and mobility: Secondary | ICD-10-CM | POA: Diagnosis not present

## 2018-06-11 DIAGNOSIS — N390 Urinary tract infection, site not specified: Secondary | ICD-10-CM | POA: Diagnosis not present

## 2018-06-14 DIAGNOSIS — J449 Chronic obstructive pulmonary disease, unspecified: Secondary | ICD-10-CM | POA: Diagnosis not present

## 2018-06-14 DIAGNOSIS — M6281 Muscle weakness (generalized): Secondary | ICD-10-CM | POA: Diagnosis not present

## 2018-06-14 DIAGNOSIS — Z8673 Personal history of transient ischemic attack (TIA), and cerebral infarction without residual deficits: Secondary | ICD-10-CM | POA: Diagnosis not present

## 2018-06-14 DIAGNOSIS — E119 Type 2 diabetes mellitus without complications: Secondary | ICD-10-CM | POA: Diagnosis not present

## 2018-06-14 DIAGNOSIS — M25569 Pain in unspecified knee: Secondary | ICD-10-CM | POA: Diagnosis not present

## 2018-06-14 DIAGNOSIS — I10 Essential (primary) hypertension: Secondary | ICD-10-CM | POA: Diagnosis not present

## 2018-06-15 ENCOUNTER — Other Ambulatory Visit
Admission: RE | Admit: 2018-06-15 | Discharge: 2018-06-15 | Disposition: A | Discharge status: Home / Self Care | Payer: Medicare HMO | Source: Ambulatory Visit | Attending: Family Medicine | Admitting: Family Medicine

## 2018-06-15 DIAGNOSIS — I509 Heart failure, unspecified: Secondary | ICD-10-CM | POA: Insufficient documentation

## 2018-06-15 DIAGNOSIS — I110 Hypertensive heart disease with heart failure: Secondary | ICD-10-CM | POA: Insufficient documentation

## 2018-06-15 LAB — BRAIN NATRIURETIC PEPTIDE: B Natriuretic Peptide: 27 pg/mL (ref 0.0–100.0)

## 2018-06-22 DIAGNOSIS — I10 Essential (primary) hypertension: Secondary | ICD-10-CM | POA: Diagnosis not present

## 2018-06-22 DIAGNOSIS — M25569 Pain in unspecified knee: Secondary | ICD-10-CM | POA: Diagnosis not present

## 2018-06-22 DIAGNOSIS — Z8673 Personal history of transient ischemic attack (TIA), and cerebral infarction without residual deficits: Secondary | ICD-10-CM | POA: Diagnosis not present

## 2018-06-22 DIAGNOSIS — M6281 Muscle weakness (generalized): Secondary | ICD-10-CM | POA: Diagnosis not present

## 2018-06-22 DIAGNOSIS — I509 Heart failure, unspecified: Secondary | ICD-10-CM | POA: Diagnosis not present

## 2018-06-22 DIAGNOSIS — J449 Chronic obstructive pulmonary disease, unspecified: Secondary | ICD-10-CM | POA: Diagnosis not present

## 2018-06-22 DIAGNOSIS — E119 Type 2 diabetes mellitus without complications: Secondary | ICD-10-CM | POA: Diagnosis not present

## 2018-07-02 DIAGNOSIS — I509 Heart failure, unspecified: Secondary | ICD-10-CM | POA: Diagnosis not present

## 2018-07-02 DIAGNOSIS — M6281 Muscle weakness (generalized): Secondary | ICD-10-CM | POA: Diagnosis not present

## 2018-07-02 DIAGNOSIS — Z8673 Personal history of transient ischemic attack (TIA), and cerebral infarction without residual deficits: Secondary | ICD-10-CM | POA: Diagnosis not present

## 2018-07-02 DIAGNOSIS — M25569 Pain in unspecified knee: Secondary | ICD-10-CM | POA: Diagnosis not present

## 2018-07-02 DIAGNOSIS — J449 Chronic obstructive pulmonary disease, unspecified: Secondary | ICD-10-CM | POA: Diagnosis not present

## 2018-07-02 DIAGNOSIS — I10 Essential (primary) hypertension: Secondary | ICD-10-CM | POA: Diagnosis not present

## 2018-07-04 ENCOUNTER — Ambulatory Visit (INDEPENDENT_AMBULATORY_CARE_PROVIDER_SITE_OTHER): Payer: Medicare HMO | Admitting: Primary Care

## 2018-07-04 ENCOUNTER — Encounter: Payer: Self-pay | Admitting: Primary Care

## 2018-07-04 VITALS — BP 134/76 | HR 85 | Ht 67.0 in | Wt 275.0 lb

## 2018-07-04 DIAGNOSIS — R269 Unspecified abnormalities of gait and mobility: Secondary | ICD-10-CM | POA: Diagnosis not present

## 2018-07-04 DIAGNOSIS — J449 Chronic obstructive pulmonary disease, unspecified: Secondary | ICD-10-CM | POA: Diagnosis not present

## 2018-07-04 DIAGNOSIS — G4733 Obstructive sleep apnea (adult) (pediatric): Secondary | ICD-10-CM | POA: Diagnosis not present

## 2018-07-04 NOTE — Progress Notes (Signed)
 @Patient  ID: Karen PolingLinda J Coplen, female    DOB: 04/16/1951, 67 y.o.   MRN: [id number]  Chief Complaint  Patient presents with  . Hospitalization Follow-up    pt recently hospitalized for bladder infection.  denies any current breathing complaints.      Referring provider: Lyndon CodeKhan, Fozia M, MD  HPI: 67 year old female, former smoker. Hx OSA, COPD, resp failure, VCD . Patient of Dr. Craige CottaSood, last seen 04/18/18. Hospitalized in May for COPD exac. Patient had not used CPAP since being discharged from nursing home a few years ago and sent machine back. Maintained on Advair, Spiriva and prn duoneb. Completed HST showing AHI 13.3 with low 02. Need sleep lab titration study.    07/05/2018 Patient presents today for hosp follow-up. Went to hosp in [place] for UTI. Patient was asymptomatic except she kept falling. No trauma or LOC. Completed abx. Doing well. No falls since back. Living at [facility] located in [place]. Breathing is fine, no issues. She has physical therapy at nursing home. Uses walker.    Pulmonary tests: PFT 08/01/09 >> FEV1 1.21 (51%), FEV1% 70, TLC 3.48 (68%), DLCO 42% CT angio chest 02/23/18 >> centrilobular emphysema  Sleep tests: PSG 04/14/04 > RDI 20 HST 05/12/18 >> AHI 13.3, SaO2 low 65%.  Spent 223 min with SaO2 < 90%. Needed sleep titration study.   Cardiac tests: Echo 02/23/18 >> EF 65 to 70%, grade 1 DD    Allergies  Allergen Reactions  . Amoxicillin-Pot Clavulanate Diarrhea    Has patient had a PCN reaction causing immediate rash, facial/tongue/throat swelling, SOB or lightheadedness with hypotension: No Has patient had a PCN reaction causing severe rash involving mucus membranes or skin necrosis: No Has patient had a PCN reaction that required hospitalization: No Has patient had a PCN reaction occurring within the last 10 years: No If all of the above answers are "NO", then may proceed with Cephalosporin use.   . Neosporin [Neomycin-Bacitracin Zn-Polymyx] Hives      Immunization History  Administered Date(s) Administered  . Influenza Whole 09/19/2007, 08/08/2008, 08/29/2008, 09/06/2009, 09/04/2010  . Influenza-Unspecified 09/21/2017  . Pneumococcal Polysaccharide-23 03/18/2018  . Td 11/17/1999    Past Medical History:  Diagnosis Date  . Arthritis   . Asthma   . Brain aneurysm   . COPD (chronic obstructive pulmonary disease) (HCC)   . Diabetes mellitus without complication (HCC)   . Hypertension   . Stroke [facility](HCC) 2003    Tobacco History: Social History   Tobacco Use  Smoking Status Former Smoker  . Last attempt to quit: 02/15/2018  . Years since quitting: 0.3  Smokeless Tobacco Never Used   Counseling given: Not Answered   Outpatient Medications Prior to Visit  Medication Sig Dispense Refill  . acetaminophen (TYLENOL 8 HOUR ARTHRITIS PAIN) 650 MG CR tablet Take 1,300 mg by mouth every 8 (eight) hours as needed for pain.    Marland Kitchen. amLODipine (NORVASC) 10 MG tablet Take 1 tablet (10 mg total) by mouth daily. 90 tablet 3  . atorvastatin (LIPITOR) 20 MG tablet Take 1 tablet (20 mg total) by mouth daily. 90 tablet 1  . clopidogrel (PLAVIX) 75 MG tablet Take 1 tablet (75 mg total) by mouth daily. 90 tablet 1  . clotrimazole (LOTRIMIN) 1 % cream Apply to affected area 2 times daily 15 g 0  . fluticasone (FLONASE) 50 MCG/ACT nasal spray Place 2 sprays into both nostrils daily.    . Fluticasone-Salmeterol (ADVAIR) 250-50 MCG/DOSE AEPB Inhale 1 puff  into the lungs 2 (two) times daily. 180 each 1  . furosemide (LASIX) 40 MG tablet Take 1 tablet (40 mg total) by mouth 2 (two) times daily. 60 tablet 3  . HYDROcodone-acetaminophen (NORCO/VICODIN) 5-325 MG tablet Take 1 tablet by mouth every 6 (six) hours as needed for severe pain. 10 tablet 0  . ibuprofen (ADVIL,MOTRIN) 400 MG tablet Take 1 tablet (400 mg total) by mouth every 6 (six) hours as needed. 30 tablet 0  . ipratropium-albuterol (DUONEB) 0.5-2.5 (3) MG/3ML SOLN TAKE 3 MLS BY NEBULIZATION EVERY  4 (FOUR) HOURS AS NEEDED. 360 mL 3  . lisinopril (PRINIVIL,ZESTRIL) 2.5 MG tablet Take 1 tablet (2.5 mg total) by mouth daily. 30 tablet 3  . potassium chloride (K-DUR) 10 MEQ tablet TAKE 2 TABLETS BY MOUTH EVERY DAY 60 tablet 1  . Saxagliptin-Metformin 5-500 MG TB24 Take 1 tablet by mouth daily. Take one tab a day for dm 30 tablet 12  . tiotropium (SPIRIVA HANDIHALER) 18 MCG inhalation capsule Use once day 30 capsule 12  . traMADol (ULTRAM) 50 MG tablet TAKE 1 TABLET BY MOUTH TWICE A DAY FOR KNEE PAIN AS NEEDED (Patient taking differently: Take 50 mg by mouth once daily) 60 tablet 1  . cephALEXin (KEFLEX) 500 MG capsule Take 1 capsule (500 mg total) by mouth 4 (four) times daily. (Patient not taking: Reported on 07/04/2018) 28 capsule 0   No facility-administered medications prior to visit.     Review of Systems  Review of Systems  Constitutional: Negative.   HENT: Negative.   Respiratory: Negative.   Cardiovascular: Negative.   Gastrointestinal: Negative.   Genitourinary: Negative.     Physical Exam  BP 134/76 (BP Location: Left Arm, Cuff Size: Normal)   Pulse 85   Ht 5\' 7"  (1.702 m)   Wt 275 lb (124.7 kg)   SpO2 93%   BMI 43.07 kg/m  Physical Exam  Constitutional: She is oriented to person, place, and time. She appears well-developed and well-nourished.  Obese female, no acute distress  HENT:  Head: Normocephalic and atraumatic.  Eyes: Pupils are equal, round, and reactive to light. EOM are normal.  Neck: Normal range of motion. Neck supple.  Cardiovascular: Normal rate and regular rhythm.  Pulmonary/Chest: Effort normal. No accessory muscle usage. No tachypnea. No respiratory distress. She has wheezes.  LS with scattered faint exp wheeze  Musculoskeletal: Normal range of motion.  In WC, gait not assessed   Neurological: She is alert and oriented to person, place, and time.  Psychiatric: She has a normal mood and affect. Her behavior is normal. Judgment and thought  content normal.     Lab Results:  CBC    Component Value Date/Time   WBC 5.1 03/19/2018 2021   RBC 4.67 03/19/2018 2021   HGB 13.9 03/19/2018 2021   HCT 45.0 03/19/2018 2021   PLT 201 03/19/2018 2021   MCV 96.4 03/19/2018 2021   MCH 29.8 03/19/2018 2021   MCHC 30.9 03/19/2018 2021   RDW 14.2 03/19/2018 2021   LYMPHSABS 1.4 03/19/2018 2021   MONOABS 0.6 03/19/2018 2021   EOSABS 0.1 03/19/2018 2021   BASOSABS 0.0 03/19/2018 2021    BMET    Component Value Date/Time   NA 143 03/21/2018 0609   K 3.3 (L) 03/21/2018 0609   CL 104 03/21/2018 0609   CO2 30 03/21/2018 0609   GLUCOSE 129 (H) 03/21/2018 0609   GLUCOSE 109 (H) 09/13/2006 0933   BUN 12 03/21/2018 0609   CREATININE 0.68  03/21/2018 0609   CALCIUM 8.8 (L) 03/21/2018 0609   GFRNONAA >60 03/21/2018 0609   GFRAA >60 03/21/2018 0609    BNP    Component Value Date/Time   BNP 27.0 06/15/2018 0400    ProBNP No results found for: PROBNP  Imaging: No results found.   Assessment & Plan:   OBSTRUCTIVE SLEEP APNEA HST showing AHI 13.3 with low 02 Needs in lab sleep titration study   COPD (chronic obstructive pulmonary disease) (HCC) Breathing is well controlled on Advair BID and Spiriva Faint exp wheezes on exam, change prn duoneb to scheduled twice daily  FU in October with Dr. Craige CottaSood   Abnormality of gait Recent falls d/t UTI infection  Encourage walker at all times Receiving PT at nursing facility     Glenford BayleyElizabeth W Ashelynn Marks, NP 07/05/2018

## 2018-07-04 NOTE — Patient Instructions (Addendum)
Needs routine FU in October with Dr. Craige CottaSood  Change Duoneb neb to scheduled twice a day   Continue Advair 250 1 puff BID and Spiriva daily   Needs to ambulate with walker at all times and attend physical therapy   OOB for all meals

## 2018-07-05 ENCOUNTER — Encounter: Payer: Self-pay | Admitting: Primary Care

## 2018-07-05 DIAGNOSIS — R269 Unspecified abnormalities of gait and mobility: Secondary | ICD-10-CM | POA: Insufficient documentation

## 2018-07-05 NOTE — Assessment & Plan Note (Signed)
Recent falls d/t UTI infection  Encourage walker at all times Receiving PT at nursing facility

## 2018-07-05 NOTE — Assessment & Plan Note (Addendum)
HST showing AHI 13.3 with low 02 Needs in lab sleep titration study

## 2018-07-05 NOTE — Assessment & Plan Note (Addendum)
Breathing is well controlled on Advair BID and Spiriva Faint exp wheezes on exam, change prn duoneb to scheduled twice daily  FU in October with Dr. Craige CottaSood

## 2018-07-05 NOTE — Progress Notes (Signed)
Reviewed and agree with assessment/plan.   Raini Tiley, MD [facility] 11/11/2016, 12:24 PM Pager:  [phone]  

## 2018-07-06 ENCOUNTER — Telehealth: Payer: Self-pay

## 2018-07-06 ENCOUNTER — Ambulatory Visit: Payer: Self-pay | Admitting: Adult Health

## 2018-07-06 NOTE — Telephone Encounter (Signed)
Gave signed orders and home health certification and plan of care for decreased functional mobility to [facility]

## 2018-07-07 ENCOUNTER — Ambulatory Visit: Payer: Medicare HMO | Attending: Family | Admitting: Family

## 2018-07-07 ENCOUNTER — Encounter: Payer: Self-pay | Admitting: Family

## 2018-07-07 VITALS — BP 121/81 | HR 81 | Resp 18 | Ht 66.0 in | Wt 271.0 lb

## 2018-07-07 DIAGNOSIS — I5032 Chronic diastolic (congestive) heart failure: Secondary | ICD-10-CM | POA: Diagnosis not present

## 2018-07-07 DIAGNOSIS — I110 Hypertensive heart disease with heart failure: Secondary | ICD-10-CM | POA: Diagnosis not present

## 2018-07-07 DIAGNOSIS — Z880 Allergy status to penicillin: Secondary | ICD-10-CM | POA: Insufficient documentation

## 2018-07-07 DIAGNOSIS — Z79899 Other long term (current) drug therapy: Secondary | ICD-10-CM | POA: Insufficient documentation

## 2018-07-07 DIAGNOSIS — J449 Chronic obstructive pulmonary disease, unspecified: Secondary | ICD-10-CM | POA: Diagnosis not present

## 2018-07-07 DIAGNOSIS — I10 Essential (primary) hypertension: Secondary | ICD-10-CM

## 2018-07-07 DIAGNOSIS — Z8673 Personal history of transient ischemic attack (TIA), and cerebral infarction without residual deficits: Secondary | ICD-10-CM | POA: Diagnosis not present

## 2018-07-07 DIAGNOSIS — Z7984 Long term (current) use of oral hypoglycemic drugs: Secondary | ICD-10-CM | POA: Insufficient documentation

## 2018-07-07 DIAGNOSIS — E669 Obesity, unspecified: Secondary | ICD-10-CM

## 2018-07-07 DIAGNOSIS — E119 Type 2 diabetes mellitus without complications: Secondary | ICD-10-CM | POA: Insufficient documentation

## 2018-07-07 DIAGNOSIS — Z87891 Personal history of nicotine dependence: Secondary | ICD-10-CM | POA: Diagnosis not present

## 2018-07-07 DIAGNOSIS — E1169 Type 2 diabetes mellitus with other specified complication: Secondary | ICD-10-CM

## 2018-07-07 NOTE — Patient Instructions (Signed)
Continue weighing daily and call for an overnight weight gain of > 2 pounds or a weekly weight gain of >5 pounds. 

## 2018-07-07 NOTE — Progress Notes (Signed)
Patient ID: Karen Perez, female    DOB: Jan 27, 1951, 67 y.o.   MRN: [id number]  HPI  Ms Lahm is a 67 y/o female with a history of DM, asthma, HTN, stroke, COPD, arthritis, recent tobacco use and chronic heart failure.   Echo report from 06/06/18 reviewed and showed an EF of 55-60%. Echo report from 02/23/18 reviewed and showed an EF of 65-70% along with mild TR and normal PA pressure.   Admitted 06/05/18 due to COPD exacerbation. Medications adjusted. Treated also for UTI. Discharged after 6 days. Was in the ED 05/11/18 due to right knee pain where she was treated and released.   She presents today for her initial visit with a chief complaint of moderate shortness of breath upon minimal exertion. She describes this as chronic in nature having been present for several months. She has associated fatigue, pedal edema and snoring along with this. She denies any difficulty sleeping, abdominal distention, palpitations, chest pain, dizziness or weight gain.   Past Medical History:  Diagnosis Date  . Arthritis   . Asthma   . Brain aneurysm   . CHF (congestive heart failure) (HCC)   . COPD (chronic obstructive pulmonary disease) (HCC)   . Diabetes mellitus without complication (HCC)   . Hypertension   . Stroke [facility]) 2003   Past Surgical History:  Procedure Laterality Date  . CEREBRAL ANEURYSM REPAIR  2013   Family History  Problem Relation Age of Onset  . Diabetes Mellitus II Daughter    Social History   Tobacco Use  . Smoking status: Former Smoker    Last attempt to quit: 02/15/2018    Years since quitting: 0.3  . Smokeless tobacco: Never Used  Substance Use Topics  . Alcohol use: No   Allergies  Allergen Reactions  . Amoxicillin-Pot Clavulanate Diarrhea    Has patient had a PCN reaction causing immediate rash, facial/tongue/throat swelling, SOB or lightheadedness with hypotension: No Has patient had a PCN reaction causing severe rash involving mucus membranes or skin necrosis:  No Has patient had a PCN reaction that required hospitalization: No Has patient had a PCN reaction occurring within the last 10 years: No If all of the above answers are "NO", then may proceed with Cephalosporin use.   . Neosporin [Neomycin-Bacitracin Zn-Polymyx] Hives   Prior to Admission medications   Medication Sig Start Date End Date Taking? Authorizing Provider  acetaminophen (TYLENOL 8 HOUR ARTHRITIS PAIN) 650 MG CR tablet Take 1,300 mg by mouth every 8 (eight) hours as needed for pain.   Yes [provider]  atorvastatin (LIPITOR) 20 MG tablet Take 1 tablet (20 mg total) by mouth daily. 01/13/18  Yes Carlean Jews, NP  clopidogrel (PLAVIX) 75 MG tablet Take 1 tablet (75 mg total) by mouth daily. 04/25/18  Yes Carlean Jews, NP  clotrimazole (LOTRIMIN) 1 % cream Apply to affected area 2 times daily 05/11/18  Yes Mesner, Barbara Cower, MD  fluticasone (FLONASE) 50 MCG/ACT nasal spray Place 2 sprays into both nostrils daily.   Yes [provider]  Fluticasone-Salmeterol (ADVAIR) 250-50 MCG/DOSE AEPB Inhale 1 puff into the lungs 2 (two) times daily. 12/16/17  Yes Boscia, Kathlynn Grate, NP  furosemide (LASIX) 40 MG tablet Take 1 tablet (40 mg total) by mouth 2 (two) times daily. 04/26/18  Yes Lyndon Code, MD  ipratropium-albuterol (DUONEB) 0.5-2.5 (3) MG/3ML SOLN TAKE 3 MLS BY NEBULIZATION EVERY 4 (FOUR) HOURS AS NEEDED. 04/26/18  Yes Lyndon Code, MD  Liniments [facility]  PAIN RELIEF PATCH EX) Apply 2 patches topically daily.   Yes [provider]  lisinopril (PRINIVIL,ZESTRIL) 2.5 MG tablet Take 1 tablet (2.5 mg total) by mouth daily. 03/15/18  Yes Lyndon Code, MD  metFORMIN (GLUCOPHAGE) 500 MG tablet Take 500 mg by mouth daily with breakfast.   Yes [provider]  pantoprazole (PROTONIX) 20 MG tablet Take 20 mg by mouth daily.   Yes [provider]  potassium chloride (K-DUR) 10 MEQ tablet TAKE 2 TABLETS BY MOUTH EVERY DAY Patient taking differently:  Take 20 mEq by mouth daily.  05/20/18  Yes Lyndon Code, MD  tiotropium [facility] HANDIHALER) 18 MCG inhalation capsule Use once day 03/23/18  Yes Lyndon Code, MD  traMADol (ULTRAM) 50 MG tablet TAKE 1 TABLET BY MOUTH TWICE A DAY FOR KNEE PAIN AS NEEDED Patient taking differently: every 12 (twelve) hours as needed.  10/28/17  Yes Carlean Jews, NP    Review of Systems  Constitutional: Positive for fatigue. Negative for appetite change.  HENT: Positive for rhinorrhea. Negative for congestion and sore throat.   Eyes: Negative.   Respiratory: Positive for shortness of breath. Negative for cough.        + snoring  Cardiovascular: Positive for leg swelling. Negative for chest pain and palpitations.  Gastrointestinal: Negative for abdominal distention and abdominal pain.  Endocrine: Negative.   Genitourinary: Negative.   Musculoskeletal: Positive for arthralgias (leg pain due to arthritis). Negative for neck pain.  Skin: Negative.   Allergic/Immunologic: Negative.   Neurological: Negative for dizziness and light-headedness.  Hematological: Negative for adenopathy. Does not bruise/bleed easily.  Psychiatric/Behavioral: Negative for dysphoric mood and sleep disturbance (sleeping on 3 pillows). The patient is not nervous/anxious.    Vitals:   07/07/18 1330  BP: 121/81  Pulse: 81  Resp: 18  SpO2: 94%  Weight: 271 lb (122.9 kg)  Height: 5\' 6"  (1.676 m)   Wt Readings from Last 3 Encounters:  07/07/18 271 lb (122.9 kg)  07/04/18 275 lb (124.7 kg)  04/18/18 275 lb (124.7 kg)   Lab Results  Component Value Date   CREATININE 0.68 03/21/2018   CREATININE 0.66 03/19/2018   CREATININE 0.71 03/02/2018   Physical Exam  Constitutional: She appears well-developed and well-nourished.  HENT:  Head: Normocephalic and atraumatic.  Neck: Normal range of motion. Neck supple. No JVD present.  Cardiovascular: Normal rate and regular rhythm.  Pulmonary/Chest: Effort normal. No respiratory  distress. She has no wheezes. She has no rales.  Abdominal: Soft. She exhibits no distension.  Musculoskeletal: She exhibits no edema or tenderness.  Skin: Skin is warm and dry.  Psychiatric: She has a normal mood and affect. Her behavior is normal.  Nursing note and vitals reviewed.  Assessment & Plan:  1: Chronic heart failure with preserved ejection fraction- - NYHA class III - euvolemic today - being weighed daily at [facility]. Order written for them to call for an overnight weight gain of >2 pounds or a weekly weight gain of >5 pounds - not adding salt to her food. Reviewed the importance of following a 2000mg  sodium diet once she returns home. Written information was given to her about this - receiving PT daily - wearing TED hose daily - will decrease her furosemide to 40mg  daily with additional 40mg  PRN for above weight gain parameters - scheduled for sleep study - BNP 06/15/18 was 27.0  2: HTN- - BP looks good today - sees PCP at [facility] - BMP 03/21/18  reviewed and showed sodium 143, potassium 3.3 and GFR >60  3: COPD- - saw pulmonology Clent Ridges([place]) 07/04/18  4: Diabetes- - glucose at facility was 104 - A1c on 02/23/18 was 5.6%  Facility medication list was reviewed.  Return in 1 month or sooner for any questions/problems before then.

## 2018-07-08 ENCOUNTER — Encounter: Payer: Self-pay | Admitting: Family

## 2018-07-08 DIAGNOSIS — I10 Essential (primary) hypertension: Secondary | ICD-10-CM | POA: Diagnosis not present

## 2018-07-08 DIAGNOSIS — R2689 Other abnormalities of gait and mobility: Secondary | ICD-10-CM | POA: Diagnosis not present

## 2018-07-08 DIAGNOSIS — M6281 Muscle weakness (generalized): Secondary | ICD-10-CM | POA: Diagnosis not present

## 2018-07-08 DIAGNOSIS — R0602 Shortness of breath: Secondary | ICD-10-CM | POA: Diagnosis not present

## 2018-07-08 DIAGNOSIS — J441 Chronic obstructive pulmonary disease with (acute) exacerbation: Secondary | ICD-10-CM | POA: Diagnosis not present

## 2018-07-08 DIAGNOSIS — I5032 Chronic diastolic (congestive) heart failure: Secondary | ICD-10-CM | POA: Insufficient documentation

## 2018-07-08 DIAGNOSIS — E876 Hypokalemia: Secondary | ICD-10-CM | POA: Diagnosis not present

## 2018-07-08 DIAGNOSIS — K219 Gastro-esophageal reflux disease without esophagitis: Secondary | ICD-10-CM | POA: Diagnosis not present

## 2018-07-08 DIAGNOSIS — E785 Hyperlipidemia, unspecified: Secondary | ICD-10-CM | POA: Diagnosis not present

## 2018-07-08 DIAGNOSIS — E1165 Type 2 diabetes mellitus with hyperglycemia: Secondary | ICD-10-CM | POA: Diagnosis not present

## 2018-07-15 DIAGNOSIS — E119 Type 2 diabetes mellitus without complications: Secondary | ICD-10-CM | POA: Diagnosis not present

## 2018-07-15 DIAGNOSIS — I509 Heart failure, unspecified: Secondary | ICD-10-CM | POA: Diagnosis not present

## 2018-07-15 DIAGNOSIS — J449 Chronic obstructive pulmonary disease, unspecified: Secondary | ICD-10-CM | POA: Diagnosis not present

## 2018-07-15 DIAGNOSIS — N390 Urinary tract infection, site not specified: Secondary | ICD-10-CM | POA: Diagnosis not present

## 2018-07-15 DIAGNOSIS — M25569 Pain in unspecified knee: Secondary | ICD-10-CM | POA: Diagnosis not present

## 2018-07-15 DIAGNOSIS — I10 Essential (primary) hypertension: Secondary | ICD-10-CM | POA: Diagnosis not present

## 2018-07-15 DIAGNOSIS — I6350 Cerebral infarction due to unspecified occlusion or stenosis of unspecified cerebral artery: Secondary | ICD-10-CM | POA: Diagnosis not present

## 2018-07-16 DIAGNOSIS — R609 Edema, unspecified: Secondary | ICD-10-CM | POA: Diagnosis not present

## 2018-08-03 ENCOUNTER — Ambulatory Visit: Payer: Medicare HMO | Attending: Family | Admitting: Family

## 2018-08-03 ENCOUNTER — Encounter: Payer: Self-pay | Admitting: Family

## 2018-08-03 VITALS — BP 122/62 | HR 85 | Resp 18 | Ht 66.0 in | Wt 276.2 lb

## 2018-08-03 DIAGNOSIS — M1990 Unspecified osteoarthritis, unspecified site: Secondary | ICD-10-CM | POA: Diagnosis not present

## 2018-08-03 DIAGNOSIS — I110 Hypertensive heart disease with heart failure: Secondary | ICD-10-CM | POA: Diagnosis not present

## 2018-08-03 DIAGNOSIS — I509 Heart failure, unspecified: Secondary | ICD-10-CM | POA: Diagnosis not present

## 2018-08-03 DIAGNOSIS — Z79899 Other long term (current) drug therapy: Secondary | ICD-10-CM | POA: Insufficient documentation

## 2018-08-03 DIAGNOSIS — Z7951 Long term (current) use of inhaled steroids: Secondary | ICD-10-CM | POA: Diagnosis not present

## 2018-08-03 DIAGNOSIS — Z8673 Personal history of transient ischemic attack (TIA), and cerebral infarction without residual deficits: Secondary | ICD-10-CM | POA: Diagnosis not present

## 2018-08-03 DIAGNOSIS — E119 Type 2 diabetes mellitus without complications: Secondary | ICD-10-CM | POA: Insufficient documentation

## 2018-08-03 DIAGNOSIS — Z87891 Personal history of nicotine dependence: Secondary | ICD-10-CM | POA: Diagnosis not present

## 2018-08-03 DIAGNOSIS — Z7902 Long term (current) use of antithrombotics/antiplatelets: Secondary | ICD-10-CM | POA: Diagnosis not present

## 2018-08-03 DIAGNOSIS — Z7984 Long term (current) use of oral hypoglycemic drugs: Secondary | ICD-10-CM | POA: Insufficient documentation

## 2018-08-03 DIAGNOSIS — I5032 Chronic diastolic (congestive) heart failure: Secondary | ICD-10-CM

## 2018-08-03 DIAGNOSIS — J449 Chronic obstructive pulmonary disease, unspecified: Secondary | ICD-10-CM | POA: Insufficient documentation

## 2018-08-03 NOTE — Patient Instructions (Signed)
Continue weighing daily and call for an overnight weight gain of > 2 pounds or a weekly weight gain of >5 pounds. 

## 2018-08-03 NOTE — Progress Notes (Signed)
Patient ID: Karen PolingLinda J Perez, female    DOB: 10/26/1951, 67 y.o.   MRN: [id number]  HPI  Karen Perez is a 67 y/o female with a history of DM, asthma, HTN, stroke, COPD, arthritis, recent tobacco use and chronic heart failure.   Echo report from 06/06/18 reviewed and showed an EF of 55-60%. Echo report from 02/23/18 reviewed and showed an EF of 65-70% along with mild TR and normal PA pressure.   Admitted 06/05/18 due to COPD exacerbation. Medications adjusted. Treated also for UTI. Discharged after 6 days. Was in the ED 05/11/18 due to right knee pain where she was treated and released.   She presents today for a follow-up visit with a chief complaint of moderate shortness of breath with minimal exertion. She describes this as chronic in nature. She has associated fatigue, pedal edema and leg pain along with this. She denies any difficulty sleeping, abdominal distention, palpitations, chest pain, cough, dizziness or weight gain. She says that she doesn't think that she's being weighed daily nor having TED hose placed daily even though both of those orders are already on her facility MAR.   Past Medical History:  Diagnosis Date  . Arthritis   . Asthma   . Brain aneurysm   . CHF (congestive heart failure) (HCC)   . COPD (chronic obstructive pulmonary disease) (HCC)   . Diabetes mellitus without complication (HCC)   . Hypertension   . Stroke [facility](HCC) 2003   Past Surgical History:  Procedure Laterality Date  . CEREBRAL ANEURYSM REPAIR  2013   Family History  Problem Relation Age of Onset  . Diabetes Mellitus II Daughter    Social History   Tobacco Use  . Smoking status: Former Smoker    Last attempt to quit: 02/15/2018    Years since quitting: 0.4  . Smokeless tobacco: Never Used  Substance Use Topics  . Alcohol use: No   Allergies  Allergen Reactions  . Amoxicillin-Pot Clavulanate Diarrhea    Has patient had a PCN reaction causing immediate rash, facial/tongue/throat swelling, SOB or  lightheadedness with hypotension: No Has patient had a PCN reaction causing severe rash involving mucus membranes or skin necrosis: No Has patient had a PCN reaction that required hospitalization: No Has patient had a PCN reaction occurring within the last 10 years: No If all of the above answers are "NO", then may proceed with Cephalosporin use.   . Neosporin [Neomycin-Bacitracin Zn-Polymyx] Hives   Prior to Admission medications   Medication Sig Start Date End Date Taking? Authorizing Provider  acetaminophen (TYLENOL 8 HOUR ARTHRITIS PAIN) 650 MG CR tablet Take 1,300 mg by mouth every 8 (eight) hours as needed for pain.   Yes [provider]  atorvastatin (LIPITOR) 20 MG tablet Take 1 tablet (20 mg total) by mouth daily. 01/13/18  Yes Carlean JewsBoscia, Heather E, NP  clopidogrel (PLAVIX) 75 MG tablet Take 1 tablet (75 mg total) by mouth daily. 04/25/18  Yes Carlean JewsBoscia, Heather E, NP  clotrimazole (LOTRIMIN) 1 % cream Apply to affected area 2 times daily 05/11/18  Yes Mesner, Barbara CowerJason, MD  fluticasone (FLONASE) 50 MCG/ACT nasal spray Place 2 sprays into both nostrils daily.   Yes [provider]  Fluticasone-Salmeterol (ADVAIR) 250-50 MCG/DOSE AEPB Inhale 1 puff into the lungs 2 (two) times daily. 12/16/17  Yes Boscia, Kathlynn GrateHeather E, NP  furosemide (LASIX) 40 MG tablet Take 1 tablet (40 mg total) by mouth 2 (two) times daily. Patient taking differently: Take 40 mg by mouth daily.  04/26/18  Yes Lyndon Code, MD  gabapentin (NEURONTIN) 100 MG capsule Take 100 mg by mouth 2 (two) times daily.   Yes [provider]  ipratropium-albuterol (DUONEB) 0.5-2.5 (3) MG/3ML SOLN TAKE 3 MLS BY NEBULIZATION EVERY 4 (FOUR) HOURS AS NEEDED. 04/26/18  Yes Lyndon Code, MD  Liniments [facility] PAIN RELIEF PATCH EX) Apply 2 patches topically daily.   Yes [provider]  lisinopril (PRINIVIL,ZESTRIL) 2.5 MG tablet Take 1 tablet (2.5 mg total) by mouth daily. 03/15/18  Yes Lyndon Code, MD  metFORMIN  (GLUCOPHAGE) 500 MG tablet Take 500 mg by mouth daily with breakfast.   Yes [provider]  potassium chloride (K-DUR) 10 MEQ tablet TAKE 2 TABLETS BY MOUTH EVERY DAY Patient taking differently: Take 20 mEq by mouth daily.  05/20/18  Yes Lyndon Code, MD  tiotropium [facility] HANDIHALER) 18 MCG inhalation capsule Use once day 03/23/18  Yes Lyndon Code, MD  traMADol (ULTRAM) 50 MG tablet TAKE 1 TABLET BY MOUTH TWICE A DAY FOR KNEE PAIN AS NEEDED Patient taking differently: every 12 (twelve) hours as needed.  10/28/17  Yes Boscia, Heather E, NP  pantoprazole (PROTONIX) 20 MG tablet Take 20 mg by mouth daily.    [provider]    Review of Systems  Constitutional: Positive for fatigue. Negative for appetite change.  HENT: Positive for rhinorrhea. Negative for congestion and sore throat.   Eyes: Negative.   Respiratory: Positive for shortness of breath. Negative for cough.        + snoring  Cardiovascular: Positive for leg swelling. Negative for chest pain and palpitations.  Gastrointestinal: Negative for abdominal distention and abdominal pain.  Endocrine: Negative.   Genitourinary: Negative.   Musculoskeletal: Positive for arthralgias (leg pain due to arthritis). Negative for neck pain.  Skin: Negative.   Allergic/Immunologic: Negative.   Neurological: Negative for dizziness and light-headedness.  Hematological: Negative for adenopathy. Does not bruise/bleed easily.  Psychiatric/Behavioral: Negative for dysphoric mood and sleep disturbance (sleeping on 3 pillows). The patient is not nervous/anxious.    Vitals:   08/03/18 1047  BP: 122/62  Pulse: 85  Resp: 18  SpO2: 95%  Weight: 276 lb 4 oz (125.3 kg)  Height: 5\' 6"  (1.676 m)   Wt Readings from Last 3 Encounters:  08/03/18 276 lb 4 oz (125.3 kg)  07/07/18 271 lb (122.9 kg)  07/04/18 275 lb (124.7 kg)   Lab Results  Component Value Date   CREATININE 0.68 03/21/2018   CREATININE 0.66 03/19/2018   CREATININE  0.71 03/02/2018    Physical Exam  Constitutional: She appears well-developed and well-nourished.  HENT:  Head: Normocephalic and atraumatic.  Neck: Normal range of motion. Neck supple. No JVD present.  Cardiovascular: Normal rate and regular rhythm.  Pulmonary/Chest: Effort normal. No respiratory distress. She has no wheezes. She has no rales.  Abdominal: Soft. She exhibits no distension.  Musculoskeletal: She exhibits edema (1+ pitting bilaterally). She exhibits no tenderness.  Skin: Skin is warm and dry.  Psychiatric: She has a normal mood and affect. Her behavior is normal.  Nursing note and vitals reviewed.  Assessment & Plan:  1: Chronic heart failure with preserved ejection fraction- - NYHA class III - euvolemic today - patient doesn't think she's being weighed daily at [facility]. Order written for them to weigh her daily and call for an overnight weight gain of >2 pounds or a weekly weight gain of >5 pounds - unable to be weighed in the office -  not adding salt to her food. Reviewed the importance of following a 2000mg  sodium diet once she returns home.  - finished PT and is now doing exercises on her own, also wheels herself to dining room and does walk short distances - does not have TED hose on today; CNA with her says that she will remind staff that the hose need to be put on daily with removal at bedtime - had home sleep study done 05/12/18; titration test at sleep lab scheduled for 08/04/18 - BNP 06/15/18 was 27.0 - has not gotten flu vaccine yet  2: HTN- - BP looks good today - sees PCP at [facility] - BMP 03/21/18 reviewed and showed sodium 143, potassium 3.3 and GFR >60  3: COPD- - saw pulmonology Clent Ridges) 07/04/18  4: Diabetes- - glucose at facility was 114 - A1c on 02/23/18 was 5.6%  Facility medication list was reviewed.  Return in 3 months or sooner for any questions/problems before then.

## 2018-08-04 ENCOUNTER — Ambulatory Visit (HOSPITAL_BASED_OUTPATIENT_CLINIC_OR_DEPARTMENT_OTHER): Payer: Medicare HMO | Attending: Primary Care | Admitting: Pulmonary Disease

## 2018-08-04 ENCOUNTER — Encounter: Payer: Self-pay | Admitting: Family

## 2018-08-04 VITALS — Ht 66.0 in | Wt 260.0 lb

## 2018-08-04 DIAGNOSIS — G4733 Obstructive sleep apnea (adult) (pediatric): Secondary | ICD-10-CM | POA: Diagnosis not present

## 2018-08-04 DIAGNOSIS — G4734 Idiopathic sleep related nonobstructive alveolar hypoventilation: Secondary | ICD-10-CM

## 2018-08-04 DIAGNOSIS — J449 Chronic obstructive pulmonary disease, unspecified: Secondary | ICD-10-CM

## 2018-08-11 DIAGNOSIS — I10 Essential (primary) hypertension: Secondary | ICD-10-CM | POA: Diagnosis not present

## 2018-08-11 DIAGNOSIS — M25569 Pain in unspecified knee: Secondary | ICD-10-CM | POA: Diagnosis not present

## 2018-08-11 DIAGNOSIS — E119 Type 2 diabetes mellitus without complications: Secondary | ICD-10-CM | POA: Diagnosis not present

## 2018-08-11 DIAGNOSIS — J449 Chronic obstructive pulmonary disease, unspecified: Secondary | ICD-10-CM | POA: Diagnosis not present

## 2018-08-11 DIAGNOSIS — Z8673 Personal history of transient ischemic attack (TIA), and cerebral infarction without residual deficits: Secondary | ICD-10-CM | POA: Diagnosis not present

## 2018-08-11 DIAGNOSIS — E785 Hyperlipidemia, unspecified: Secondary | ICD-10-CM | POA: Diagnosis not present

## 2018-08-11 DIAGNOSIS — I509 Heart failure, unspecified: Secondary | ICD-10-CM | POA: Diagnosis not present

## 2018-08-14 ENCOUNTER — Telehealth: Payer: Self-pay | Admitting: Pulmonary Disease

## 2018-08-14 DIAGNOSIS — G4733 Obstructive sleep apnea (adult) (pediatric): Secondary | ICD-10-CM | POA: Diagnosis not present

## 2018-08-14 NOTE — Procedures (Signed)
    Patient Name: Karen Perez, Karen Perez Date: 08/04/2018   Gender: Female  D.O.B: 1950-12-15  Age (years): 48  Referring Provider: Ames Dura NP  Height (inches): 66  Interpreting Physician: Coralyn Helling MD, ABSM  Weight (lbs): 260  RPSGT: Shelah Lewandowsky  BMI: 42  MRN: [id number]  Neck Size: 18.00  <br> <br>  CLINICAL INFORMATION  67 year old female with history of COPD and Obstructive Sleep Apnea. She had home sleep study from 05/12/18 with an AHI 13.3 and SaO2 low 65%. ?She spent 223 min with SaO2 <?90%. She presents of a CPAP titration study. SLEEP STUDY TECHNIQUE  As per the AASM Manual for the Scoring of Sleep and Associated Events v2.3 (April 2016) with a hypopnea requiring 4% desaturations. The channels recorded and monitored were frontal, central and occipital EEG, electrooculogram (EOG), submentalis EMG (chin), nasal and oral airflow, thoracic and abdominal wall motion, anterior tibialis EMG, snore microphone, electrocardiogram, and pulse oximetry. Continuous positive airway pressure (CPAP) was initiated at the beginning of the study and titrated to treat sleep-disordered breathing. MEDICATIONS  Medications self-administered by patient taken the night of the study : N/A TECHNICIAN COMMENTS  Comments added by technician: O2 initiated due to low sats. Patient talked in his/her sleep. Comments added by scorer: N/A  RESPIRATORY PARAMETERS  Optimal PAP Pressure (cm):  17 AHI at Optimal Pressure (/hr): 11.6  Overall Minimal O2 (%): 75.0 Supine % at Optimal Pressure (%): 0  Minimal O2 at Optimal Pressure (%): 83.0        She had reasonable control of her sleep apnea with CPAP. She continued to have oxygen desaturation in the absence of other respiratory events, and this lasted for more than 5 minutes. She had 1 liter supplemental oxygen added.  SLEEP ARCHITECTURE  The study was initiated at 10:27:26 PM and ended at 4:57:08 AM. Sleep onset time was 17.6 minutes and the  sleep efficiency was 86.9%%. The total sleep time was 338.5 minutes. The patient spent 5.0%% of the night in stage N1 sleep, 53.8%% in stage N2 sleep, 0.0%% in stage N3 and 41.2% in REM.Stage REM latency was 41.5 minutes Wake after sleep onset was 33.6. Alpha intrusion was absent. Supine sleep was 15.21%. CARDIAC DATA  The 2 lead EKG demonstrated sinus rhythm. The mean heart rate was 80.1 beats per minute. Other EKG findings include: PVCs.  LEG MOVEMENT DATA  The total Periodic Limb Movements of Sleep (PLMS) were 0. The PLMS index was 0.0. A PLMS index of <15 is considered normal in adults. IMPRESSIONS  - She had reasonable control of her sleep apnea with CPAP 17 cm H2O. - She had 1 liter oxygen applied with CPAP. DIAGNOSIS  - Obstructive Sleep Apnea - Sleep Related Hypoxia - COPD RECOMMENDATIONS  - Trial of CPAP therapy on 17 cm H2O with 1 liter oxygen. - She was fitted with a Medium Wide size [employer]ips Respironics Full Face Mask Dreamwear mask and heated humidification. [Electronically signed] 08/14/2018 10:50 AM Coralyn Helling MD, ABSM  Diplomate, American Board of Sleep Medicine  NPI: [id number]

## 2018-08-14 NOTE — Telephone Encounter (Signed)
CPAP 08/04/18 >> CPAP 17 cm H2O with 1 liter O2 >> AHI 11   Please let her know she did well during sleep study with CPAP 17 cm H2O and need 1 liter oxygen at night.  Please arrange for CPAP 17 cm H2O with heated humidity and mask of choice.  Have 1 liter oxygen added at night with CPAP.    She needs ROV in 2 months after CPAP set up.  Can reschedule appointment from 09/05/18.

## 2018-08-15 NOTE — Telephone Encounter (Signed)
Attempted to call patient today regarding results. I did not receive an answer at time of call. I have left a voicemail message for pt to return call. X1  

## 2018-08-16 NOTE — Telephone Encounter (Signed)
Attempted to call patient today regarding results. I did not receive an answer at time of call. I have left a voicemail message for pt to return call. X2  

## 2018-08-17 NOTE — Telephone Encounter (Signed)
Attempted to call patient today regarding results. I have left a voicemail message for pt to return call. [phone] We have left three messages for pt to call our office back regarding results. No response at this time, a letter has been placed in mail today for pt to call our office. Mailed letter of communication today. Nothing further needed at this time.    

## 2018-08-22 ENCOUNTER — Other Ambulatory Visit: Payer: Self-pay

## 2018-08-22 MED ORDER — ATORVASTATIN CALCIUM 20 MG PO TABS: 20.0000 mg | ORAL_TABLET | Freq: Every day | ORAL | 0 refills | Status: AC

## 2018-08-24 ENCOUNTER — Telehealth: Payer: Self-pay | Admitting: Pulmonary Disease

## 2018-08-24 DIAGNOSIS — G4733 Obstructive sleep apnea (adult) (pediatric): Secondary | ICD-10-CM

## 2018-08-24 NOTE — Telephone Encounter (Signed)
Pt is aware of below results and voiced her understanding.  Pt agreed with cpap therapy. Order has been placed to [facility].  Pt has been scheduled for OV on 11/04/18 with VS. Nothing further is needed.

## 2018-08-24 NOTE — Telephone Encounter (Signed)
Message from 08/14/18 that was closed:  CPAP 08/04/18 >> CPAP 17 cm H2O with 1 liter O2 >> AHI 11  Please let her know she did well during sleep study with CPAP 17 cm H2O and need 1 liter oxygen at night.  Please arrange for CPAP 17 cm H2O with heated humidity and mask of choice.  Have 1 liter oxygen added at night with CPAP.    She needs ROV in 2 months after CPAP set up.  Can reschedule appointment from 09/05/18.  Will leave encounter open until 5pm per pt's request to call her back then.

## 2018-08-25 DIAGNOSIS — M170 Bilateral primary osteoarthritis of knee: Secondary | ICD-10-CM | POA: Diagnosis not present

## 2018-08-25 DIAGNOSIS — M25562 Pain in left knee: Secondary | ICD-10-CM | POA: Diagnosis not present

## 2018-08-25 DIAGNOSIS — Z6841 Body Mass Index (BMI) 40.0 and over, adult: Secondary | ICD-10-CM | POA: Diagnosis not present

## 2018-08-25 DIAGNOSIS — M25561 Pain in right knee: Secondary | ICD-10-CM | POA: Diagnosis not present

## 2018-09-05 ENCOUNTER — Ambulatory Visit: Payer: Medicare HMO | Admitting: Pulmonary Disease

## 2018-09-13 DIAGNOSIS — J449 Chronic obstructive pulmonary disease, unspecified: Secondary | ICD-10-CM | POA: Diagnosis not present

## 2018-09-13 DIAGNOSIS — E785 Hyperlipidemia, unspecified: Secondary | ICD-10-CM | POA: Diagnosis not present

## 2018-09-13 DIAGNOSIS — Z8673 Personal history of transient ischemic attack (TIA), and cerebral infarction without residual deficits: Secondary | ICD-10-CM | POA: Diagnosis not present

## 2018-09-13 DIAGNOSIS — E119 Type 2 diabetes mellitus without complications: Secondary | ICD-10-CM | POA: Diagnosis not present

## 2018-09-13 DIAGNOSIS — M25569 Pain in unspecified knee: Secondary | ICD-10-CM | POA: Diagnosis not present

## 2018-09-13 DIAGNOSIS — I509 Heart failure, unspecified: Secondary | ICD-10-CM | POA: Diagnosis not present

## 2018-09-13 DIAGNOSIS — I10 Essential (primary) hypertension: Secondary | ICD-10-CM | POA: Diagnosis not present

## 2018-09-17 DIAGNOSIS — E1165 Type 2 diabetes mellitus with hyperglycemia: Secondary | ICD-10-CM | POA: Diagnosis not present

## 2018-09-17 DIAGNOSIS — E785 Hyperlipidemia, unspecified: Secondary | ICD-10-CM | POA: Diagnosis not present

## 2018-09-17 DIAGNOSIS — R0602 Shortness of breath: Secondary | ICD-10-CM | POA: Diagnosis not present

## 2018-09-17 DIAGNOSIS — I5022 Chronic systolic (congestive) heart failure: Secondary | ICD-10-CM | POA: Diagnosis not present

## 2018-09-17 DIAGNOSIS — E876 Hypokalemia: Secondary | ICD-10-CM | POA: Diagnosis not present

## 2018-09-17 DIAGNOSIS — I10 Essential (primary) hypertension: Secondary | ICD-10-CM | POA: Diagnosis not present

## 2018-09-17 DIAGNOSIS — M1990 Unspecified osteoarthritis, unspecified site: Secondary | ICD-10-CM | POA: Diagnosis not present

## 2018-09-17 DIAGNOSIS — M6281 Muscle weakness (generalized): Secondary | ICD-10-CM | POA: Diagnosis not present

## 2018-09-17 DIAGNOSIS — R2681 Unsteadiness on feet: Secondary | ICD-10-CM | POA: Diagnosis not present

## 2018-09-19 DIAGNOSIS — R0602 Shortness of breath: Secondary | ICD-10-CM | POA: Diagnosis not present

## 2018-09-19 DIAGNOSIS — R2681 Unsteadiness on feet: Secondary | ICD-10-CM | POA: Diagnosis not present

## 2018-09-19 DIAGNOSIS — I5022 Chronic systolic (congestive) heart failure: Secondary | ICD-10-CM | POA: Diagnosis not present

## 2018-09-19 DIAGNOSIS — E1165 Type 2 diabetes mellitus with hyperglycemia: Secondary | ICD-10-CM | POA: Diagnosis not present

## 2018-09-19 DIAGNOSIS — E876 Hypokalemia: Secondary | ICD-10-CM | POA: Diagnosis not present

## 2018-09-19 DIAGNOSIS — E785 Hyperlipidemia, unspecified: Secondary | ICD-10-CM | POA: Diagnosis not present

## 2018-09-19 DIAGNOSIS — M6281 Muscle weakness (generalized): Secondary | ICD-10-CM | POA: Diagnosis not present

## 2018-09-19 DIAGNOSIS — I10 Essential (primary) hypertension: Secondary | ICD-10-CM | POA: Diagnosis not present

## 2018-09-19 DIAGNOSIS — M1990 Unspecified osteoarthritis, unspecified site: Secondary | ICD-10-CM | POA: Diagnosis not present

## 2018-09-20 DIAGNOSIS — R0602 Shortness of breath: Secondary | ICD-10-CM | POA: Diagnosis not present

## 2018-09-20 DIAGNOSIS — E1165 Type 2 diabetes mellitus with hyperglycemia: Secondary | ICD-10-CM | POA: Diagnosis not present

## 2018-09-20 DIAGNOSIS — I10 Essential (primary) hypertension: Secondary | ICD-10-CM | POA: Diagnosis not present

## 2018-09-20 DIAGNOSIS — M1990 Unspecified osteoarthritis, unspecified site: Secondary | ICD-10-CM | POA: Diagnosis not present

## 2018-09-20 DIAGNOSIS — E876 Hypokalemia: Secondary | ICD-10-CM | POA: Diagnosis not present

## 2018-09-20 DIAGNOSIS — M6281 Muscle weakness (generalized): Secondary | ICD-10-CM | POA: Diagnosis not present

## 2018-09-20 DIAGNOSIS — R2681 Unsteadiness on feet: Secondary | ICD-10-CM | POA: Diagnosis not present

## 2018-09-20 DIAGNOSIS — I5022 Chronic systolic (congestive) heart failure: Secondary | ICD-10-CM | POA: Diagnosis not present

## 2018-09-20 DIAGNOSIS — E785 Hyperlipidemia, unspecified: Secondary | ICD-10-CM | POA: Diagnosis not present

## 2018-09-21 DIAGNOSIS — M1990 Unspecified osteoarthritis, unspecified site: Secondary | ICD-10-CM | POA: Diagnosis not present

## 2018-09-21 DIAGNOSIS — R2681 Unsteadiness on feet: Secondary | ICD-10-CM | POA: Diagnosis not present

## 2018-09-21 DIAGNOSIS — R0602 Shortness of breath: Secondary | ICD-10-CM | POA: Diagnosis not present

## 2018-09-21 DIAGNOSIS — M6281 Muscle weakness (generalized): Secondary | ICD-10-CM | POA: Diagnosis not present

## 2018-09-21 DIAGNOSIS — E1165 Type 2 diabetes mellitus with hyperglycemia: Secondary | ICD-10-CM | POA: Diagnosis not present

## 2018-09-21 DIAGNOSIS — I5022 Chronic systolic (congestive) heart failure: Secondary | ICD-10-CM | POA: Diagnosis not present

## 2018-09-21 DIAGNOSIS — E785 Hyperlipidemia, unspecified: Secondary | ICD-10-CM | POA: Diagnosis not present

## 2018-09-21 DIAGNOSIS — I10 Essential (primary) hypertension: Secondary | ICD-10-CM | POA: Diagnosis not present

## 2018-09-21 DIAGNOSIS — E876 Hypokalemia: Secondary | ICD-10-CM | POA: Diagnosis not present

## 2018-09-23 DIAGNOSIS — M25562 Pain in left knee: Secondary | ICD-10-CM | POA: Diagnosis not present

## 2018-09-23 DIAGNOSIS — G8929 Other chronic pain: Secondary | ICD-10-CM | POA: Diagnosis not present

## 2018-09-23 DIAGNOSIS — M170 Bilateral primary osteoarthritis of knee: Secondary | ICD-10-CM | POA: Diagnosis not present

## 2018-09-23 DIAGNOSIS — M25561 Pain in right knee: Secondary | ICD-10-CM | POA: Diagnosis not present

## 2018-09-26 DIAGNOSIS — M6281 Muscle weakness (generalized): Secondary | ICD-10-CM | POA: Diagnosis not present

## 2018-09-26 DIAGNOSIS — E785 Hyperlipidemia, unspecified: Secondary | ICD-10-CM | POA: Diagnosis not present

## 2018-09-26 DIAGNOSIS — R2681 Unsteadiness on feet: Secondary | ICD-10-CM | POA: Diagnosis not present

## 2018-09-26 DIAGNOSIS — M1990 Unspecified osteoarthritis, unspecified site: Secondary | ICD-10-CM | POA: Diagnosis not present

## 2018-09-26 DIAGNOSIS — I10 Essential (primary) hypertension: Secondary | ICD-10-CM | POA: Diagnosis not present

## 2018-09-26 DIAGNOSIS — E876 Hypokalemia: Secondary | ICD-10-CM | POA: Diagnosis not present

## 2018-09-26 DIAGNOSIS — R0602 Shortness of breath: Secondary | ICD-10-CM | POA: Diagnosis not present

## 2018-09-26 DIAGNOSIS — I5022 Chronic systolic (congestive) heart failure: Secondary | ICD-10-CM | POA: Diagnosis not present

## 2018-09-26 DIAGNOSIS — E1165 Type 2 diabetes mellitus with hyperglycemia: Secondary | ICD-10-CM | POA: Diagnosis not present

## 2018-09-27 DIAGNOSIS — I5022 Chronic systolic (congestive) heart failure: Secondary | ICD-10-CM | POA: Diagnosis not present

## 2018-09-27 DIAGNOSIS — E1165 Type 2 diabetes mellitus with hyperglycemia: Secondary | ICD-10-CM | POA: Diagnosis not present

## 2018-09-27 DIAGNOSIS — M6281 Muscle weakness (generalized): Secondary | ICD-10-CM | POA: Diagnosis not present

## 2018-09-27 DIAGNOSIS — R2681 Unsteadiness on feet: Secondary | ICD-10-CM | POA: Diagnosis not present

## 2018-09-27 DIAGNOSIS — E785 Hyperlipidemia, unspecified: Secondary | ICD-10-CM | POA: Diagnosis not present

## 2018-09-27 DIAGNOSIS — E876 Hypokalemia: Secondary | ICD-10-CM | POA: Diagnosis not present

## 2018-09-27 DIAGNOSIS — I10 Essential (primary) hypertension: Secondary | ICD-10-CM | POA: Diagnosis not present

## 2018-09-27 DIAGNOSIS — M1990 Unspecified osteoarthritis, unspecified site: Secondary | ICD-10-CM | POA: Diagnosis not present

## 2018-09-27 DIAGNOSIS — R0602 Shortness of breath: Secondary | ICD-10-CM | POA: Diagnosis not present

## 2018-09-28 DIAGNOSIS — I10 Essential (primary) hypertension: Secondary | ICD-10-CM | POA: Diagnosis not present

## 2018-09-28 DIAGNOSIS — R0602 Shortness of breath: Secondary | ICD-10-CM | POA: Diagnosis not present

## 2018-09-28 DIAGNOSIS — M1990 Unspecified osteoarthritis, unspecified site: Secondary | ICD-10-CM | POA: Diagnosis not present

## 2018-09-28 DIAGNOSIS — R2681 Unsteadiness on feet: Secondary | ICD-10-CM | POA: Diagnosis not present

## 2018-09-28 DIAGNOSIS — M6281 Muscle weakness (generalized): Secondary | ICD-10-CM | POA: Diagnosis not present

## 2018-09-28 DIAGNOSIS — E876 Hypokalemia: Secondary | ICD-10-CM | POA: Diagnosis not present

## 2018-09-28 DIAGNOSIS — E1165 Type 2 diabetes mellitus with hyperglycemia: Secondary | ICD-10-CM | POA: Diagnosis not present

## 2018-09-28 DIAGNOSIS — E785 Hyperlipidemia, unspecified: Secondary | ICD-10-CM | POA: Diagnosis not present

## 2018-09-28 DIAGNOSIS — I5022 Chronic systolic (congestive) heart failure: Secondary | ICD-10-CM | POA: Diagnosis not present

## 2018-09-29 DIAGNOSIS — M6281 Muscle weakness (generalized): Secondary | ICD-10-CM | POA: Diagnosis not present

## 2018-09-29 DIAGNOSIS — I10 Essential (primary) hypertension: Secondary | ICD-10-CM | POA: Diagnosis not present

## 2018-09-29 DIAGNOSIS — M1990 Unspecified osteoarthritis, unspecified site: Secondary | ICD-10-CM | POA: Diagnosis not present

## 2018-09-29 DIAGNOSIS — R0602 Shortness of breath: Secondary | ICD-10-CM | POA: Diagnosis not present

## 2018-09-29 DIAGNOSIS — I5022 Chronic systolic (congestive) heart failure: Secondary | ICD-10-CM | POA: Diagnosis not present

## 2018-09-29 DIAGNOSIS — E876 Hypokalemia: Secondary | ICD-10-CM | POA: Diagnosis not present

## 2018-09-29 DIAGNOSIS — R2681 Unsteadiness on feet: Secondary | ICD-10-CM | POA: Diagnosis not present

## 2018-09-29 DIAGNOSIS — E785 Hyperlipidemia, unspecified: Secondary | ICD-10-CM | POA: Diagnosis not present

## 2018-09-29 DIAGNOSIS — E1165 Type 2 diabetes mellitus with hyperglycemia: Secondary | ICD-10-CM | POA: Diagnosis not present

## 2018-09-30 DIAGNOSIS — R0602 Shortness of breath: Secondary | ICD-10-CM | POA: Diagnosis not present

## 2018-09-30 DIAGNOSIS — E1165 Type 2 diabetes mellitus with hyperglycemia: Secondary | ICD-10-CM | POA: Diagnosis not present

## 2018-09-30 DIAGNOSIS — M6281 Muscle weakness (generalized): Secondary | ICD-10-CM | POA: Diagnosis not present

## 2018-09-30 DIAGNOSIS — E785 Hyperlipidemia, unspecified: Secondary | ICD-10-CM | POA: Diagnosis not present

## 2018-09-30 DIAGNOSIS — I10 Essential (primary) hypertension: Secondary | ICD-10-CM | POA: Diagnosis not present

## 2018-09-30 DIAGNOSIS — M1990 Unspecified osteoarthritis, unspecified site: Secondary | ICD-10-CM | POA: Diagnosis not present

## 2018-09-30 DIAGNOSIS — R2681 Unsteadiness on feet: Secondary | ICD-10-CM | POA: Diagnosis not present

## 2018-09-30 DIAGNOSIS — M170 Bilateral primary osteoarthritis of knee: Secondary | ICD-10-CM | POA: Diagnosis not present

## 2018-09-30 DIAGNOSIS — I5022 Chronic systolic (congestive) heart failure: Secondary | ICD-10-CM | POA: Diagnosis not present

## 2018-09-30 DIAGNOSIS — E876 Hypokalemia: Secondary | ICD-10-CM | POA: Diagnosis not present

## 2018-10-03 DIAGNOSIS — R2681 Unsteadiness on feet: Secondary | ICD-10-CM | POA: Diagnosis not present

## 2018-10-03 DIAGNOSIS — M1990 Unspecified osteoarthritis, unspecified site: Secondary | ICD-10-CM | POA: Diagnosis not present

## 2018-10-03 DIAGNOSIS — M6281 Muscle weakness (generalized): Secondary | ICD-10-CM | POA: Diagnosis not present

## 2018-10-03 DIAGNOSIS — I5022 Chronic systolic (congestive) heart failure: Secondary | ICD-10-CM | POA: Diagnosis not present

## 2018-10-03 DIAGNOSIS — R0602 Shortness of breath: Secondary | ICD-10-CM | POA: Diagnosis not present

## 2018-10-03 DIAGNOSIS — I10 Essential (primary) hypertension: Secondary | ICD-10-CM | POA: Diagnosis not present

## 2018-10-03 DIAGNOSIS — E876 Hypokalemia: Secondary | ICD-10-CM | POA: Diagnosis not present

## 2018-10-03 DIAGNOSIS — E785 Hyperlipidemia, unspecified: Secondary | ICD-10-CM | POA: Diagnosis not present

## 2018-10-03 DIAGNOSIS — E1165 Type 2 diabetes mellitus with hyperglycemia: Secondary | ICD-10-CM | POA: Diagnosis not present

## 2018-10-04 ENCOUNTER — Ambulatory Visit: Payer: Self-pay | Admitting: Adult Health

## 2018-10-04 DIAGNOSIS — I5020 Unspecified systolic (congestive) heart failure: Secondary | ICD-10-CM | POA: Diagnosis not present

## 2018-10-04 DIAGNOSIS — E876 Hypokalemia: Secondary | ICD-10-CM | POA: Diagnosis not present

## 2018-10-04 DIAGNOSIS — E1165 Type 2 diabetes mellitus with hyperglycemia: Secondary | ICD-10-CM | POA: Diagnosis not present

## 2018-10-04 DIAGNOSIS — I10 Essential (primary) hypertension: Secondary | ICD-10-CM | POA: Diagnosis not present

## 2018-10-05 DIAGNOSIS — E876 Hypokalemia: Secondary | ICD-10-CM | POA: Diagnosis not present

## 2018-10-05 DIAGNOSIS — M1990 Unspecified osteoarthritis, unspecified site: Secondary | ICD-10-CM | POA: Diagnosis not present

## 2018-10-05 DIAGNOSIS — M6281 Muscle weakness (generalized): Secondary | ICD-10-CM | POA: Diagnosis not present

## 2018-10-05 DIAGNOSIS — I5022 Chronic systolic (congestive) heart failure: Secondary | ICD-10-CM | POA: Diagnosis not present

## 2018-10-05 DIAGNOSIS — R2681 Unsteadiness on feet: Secondary | ICD-10-CM | POA: Diagnosis not present

## 2018-10-05 DIAGNOSIS — E1165 Type 2 diabetes mellitus with hyperglycemia: Secondary | ICD-10-CM | POA: Diagnosis not present

## 2018-10-05 DIAGNOSIS — E785 Hyperlipidemia, unspecified: Secondary | ICD-10-CM | POA: Diagnosis not present

## 2018-10-05 DIAGNOSIS — I10 Essential (primary) hypertension: Secondary | ICD-10-CM | POA: Diagnosis not present

## 2018-10-05 DIAGNOSIS — R0602 Shortness of breath: Secondary | ICD-10-CM | POA: Diagnosis not present

## 2018-10-06 DIAGNOSIS — I10 Essential (primary) hypertension: Secondary | ICD-10-CM | POA: Diagnosis not present

## 2018-10-06 DIAGNOSIS — M6281 Muscle weakness (generalized): Secondary | ICD-10-CM | POA: Diagnosis not present

## 2018-10-06 DIAGNOSIS — M1990 Unspecified osteoarthritis, unspecified site: Secondary | ICD-10-CM | POA: Diagnosis not present

## 2018-10-06 DIAGNOSIS — E785 Hyperlipidemia, unspecified: Secondary | ICD-10-CM | POA: Diagnosis not present

## 2018-10-06 DIAGNOSIS — I5022 Chronic systolic (congestive) heart failure: Secondary | ICD-10-CM | POA: Diagnosis not present

## 2018-10-06 DIAGNOSIS — R2681 Unsteadiness on feet: Secondary | ICD-10-CM | POA: Diagnosis not present

## 2018-10-06 DIAGNOSIS — E876 Hypokalemia: Secondary | ICD-10-CM | POA: Diagnosis not present

## 2018-10-06 DIAGNOSIS — R0602 Shortness of breath: Secondary | ICD-10-CM | POA: Diagnosis not present

## 2018-10-06 DIAGNOSIS — E1165 Type 2 diabetes mellitus with hyperglycemia: Secondary | ICD-10-CM | POA: Diagnosis not present

## 2018-10-07 DIAGNOSIS — E785 Hyperlipidemia, unspecified: Secondary | ICD-10-CM | POA: Diagnosis not present

## 2018-10-07 DIAGNOSIS — M25562 Pain in left knee: Secondary | ICD-10-CM | POA: Diagnosis not present

## 2018-10-07 DIAGNOSIS — E1165 Type 2 diabetes mellitus with hyperglycemia: Secondary | ICD-10-CM | POA: Diagnosis not present

## 2018-10-07 DIAGNOSIS — I10 Essential (primary) hypertension: Secondary | ICD-10-CM | POA: Diagnosis not present

## 2018-10-07 DIAGNOSIS — R2681 Unsteadiness on feet: Secondary | ICD-10-CM | POA: Diagnosis not present

## 2018-10-07 DIAGNOSIS — R0602 Shortness of breath: Secondary | ICD-10-CM | POA: Diagnosis not present

## 2018-10-07 DIAGNOSIS — G8929 Other chronic pain: Secondary | ICD-10-CM | POA: Diagnosis not present

## 2018-10-07 DIAGNOSIS — E876 Hypokalemia: Secondary | ICD-10-CM | POA: Diagnosis not present

## 2018-10-07 DIAGNOSIS — I5022 Chronic systolic (congestive) heart failure: Secondary | ICD-10-CM | POA: Diagnosis not present

## 2018-10-07 DIAGNOSIS — M25561 Pain in right knee: Secondary | ICD-10-CM | POA: Diagnosis not present

## 2018-10-07 DIAGNOSIS — M170 Bilateral primary osteoarthritis of knee: Secondary | ICD-10-CM | POA: Diagnosis not present

## 2018-10-07 DIAGNOSIS — M6281 Muscle weakness (generalized): Secondary | ICD-10-CM | POA: Diagnosis not present

## 2018-10-07 DIAGNOSIS — M1990 Unspecified osteoarthritis, unspecified site: Secondary | ICD-10-CM | POA: Diagnosis not present

## 2018-10-10 DIAGNOSIS — M1990 Unspecified osteoarthritis, unspecified site: Secondary | ICD-10-CM | POA: Diagnosis not present

## 2018-10-10 DIAGNOSIS — E876 Hypokalemia: Secondary | ICD-10-CM | POA: Diagnosis not present

## 2018-10-10 DIAGNOSIS — E1165 Type 2 diabetes mellitus with hyperglycemia: Secondary | ICD-10-CM | POA: Diagnosis not present

## 2018-10-10 DIAGNOSIS — E785 Hyperlipidemia, unspecified: Secondary | ICD-10-CM | POA: Diagnosis not present

## 2018-10-10 DIAGNOSIS — R0602 Shortness of breath: Secondary | ICD-10-CM | POA: Diagnosis not present

## 2018-10-10 DIAGNOSIS — R2681 Unsteadiness on feet: Secondary | ICD-10-CM | POA: Diagnosis not present

## 2018-10-10 DIAGNOSIS — M6281 Muscle weakness (generalized): Secondary | ICD-10-CM | POA: Diagnosis not present

## 2018-10-10 DIAGNOSIS — I5022 Chronic systolic (congestive) heart failure: Secondary | ICD-10-CM | POA: Diagnosis not present

## 2018-10-10 DIAGNOSIS — I10 Essential (primary) hypertension: Secondary | ICD-10-CM | POA: Diagnosis not present

## 2018-10-11 DIAGNOSIS — M1990 Unspecified osteoarthritis, unspecified site: Secondary | ICD-10-CM | POA: Diagnosis not present

## 2018-10-11 DIAGNOSIS — I5022 Chronic systolic (congestive) heart failure: Secondary | ICD-10-CM | POA: Diagnosis not present

## 2018-10-11 DIAGNOSIS — E785 Hyperlipidemia, unspecified: Secondary | ICD-10-CM | POA: Diagnosis not present

## 2018-10-11 DIAGNOSIS — R0602 Shortness of breath: Secondary | ICD-10-CM | POA: Diagnosis not present

## 2018-10-11 DIAGNOSIS — E1165 Type 2 diabetes mellitus with hyperglycemia: Secondary | ICD-10-CM | POA: Diagnosis not present

## 2018-10-11 DIAGNOSIS — M6281 Muscle weakness (generalized): Secondary | ICD-10-CM | POA: Diagnosis not present

## 2018-10-11 DIAGNOSIS — E876 Hypokalemia: Secondary | ICD-10-CM | POA: Diagnosis not present

## 2018-10-11 DIAGNOSIS — I10 Essential (primary) hypertension: Secondary | ICD-10-CM | POA: Diagnosis not present

## 2018-10-11 DIAGNOSIS — R2681 Unsteadiness on feet: Secondary | ICD-10-CM | POA: Diagnosis not present

## 2018-10-12 DIAGNOSIS — M1990 Unspecified osteoarthritis, unspecified site: Secondary | ICD-10-CM | POA: Diagnosis not present

## 2018-10-12 DIAGNOSIS — E785 Hyperlipidemia, unspecified: Secondary | ICD-10-CM | POA: Diagnosis not present

## 2018-10-12 DIAGNOSIS — M6281 Muscle weakness (generalized): Secondary | ICD-10-CM | POA: Diagnosis not present

## 2018-10-12 DIAGNOSIS — I10 Essential (primary) hypertension: Secondary | ICD-10-CM | POA: Diagnosis not present

## 2018-10-12 DIAGNOSIS — R2681 Unsteadiness on feet: Secondary | ICD-10-CM | POA: Diagnosis not present

## 2018-10-12 DIAGNOSIS — R0602 Shortness of breath: Secondary | ICD-10-CM | POA: Diagnosis not present

## 2018-10-12 DIAGNOSIS — I5022 Chronic systolic (congestive) heart failure: Secondary | ICD-10-CM | POA: Diagnosis not present

## 2018-10-12 DIAGNOSIS — E876 Hypokalemia: Secondary | ICD-10-CM | POA: Diagnosis not present

## 2018-10-12 DIAGNOSIS — E1165 Type 2 diabetes mellitus with hyperglycemia: Secondary | ICD-10-CM | POA: Diagnosis not present

## 2018-10-13 DIAGNOSIS — E876 Hypokalemia: Secondary | ICD-10-CM | POA: Diagnosis not present

## 2018-10-13 DIAGNOSIS — M6281 Muscle weakness (generalized): Secondary | ICD-10-CM | POA: Diagnosis not present

## 2018-10-13 DIAGNOSIS — I10 Essential (primary) hypertension: Secondary | ICD-10-CM | POA: Diagnosis not present

## 2018-10-13 DIAGNOSIS — I5022 Chronic systolic (congestive) heart failure: Secondary | ICD-10-CM | POA: Diagnosis not present

## 2018-10-13 DIAGNOSIS — M1990 Unspecified osteoarthritis, unspecified site: Secondary | ICD-10-CM | POA: Diagnosis not present

## 2018-10-13 DIAGNOSIS — E785 Hyperlipidemia, unspecified: Secondary | ICD-10-CM | POA: Diagnosis not present

## 2018-10-13 DIAGNOSIS — R0602 Shortness of breath: Secondary | ICD-10-CM | POA: Diagnosis not present

## 2018-10-13 DIAGNOSIS — E1165 Type 2 diabetes mellitus with hyperglycemia: Secondary | ICD-10-CM | POA: Diagnosis not present

## 2018-10-13 DIAGNOSIS — R2681 Unsteadiness on feet: Secondary | ICD-10-CM | POA: Diagnosis not present

## 2018-10-14 DIAGNOSIS — E785 Hyperlipidemia, unspecified: Secondary | ICD-10-CM | POA: Diagnosis not present

## 2018-10-14 DIAGNOSIS — M1990 Unspecified osteoarthritis, unspecified site: Secondary | ICD-10-CM | POA: Diagnosis not present

## 2018-10-14 DIAGNOSIS — R0602 Shortness of breath: Secondary | ICD-10-CM | POA: Diagnosis not present

## 2018-10-14 DIAGNOSIS — R2681 Unsteadiness on feet: Secondary | ICD-10-CM | POA: Diagnosis not present

## 2018-10-14 DIAGNOSIS — M6281 Muscle weakness (generalized): Secondary | ICD-10-CM | POA: Diagnosis not present

## 2018-10-14 DIAGNOSIS — I5022 Chronic systolic (congestive) heart failure: Secondary | ICD-10-CM | POA: Diagnosis not present

## 2018-10-14 DIAGNOSIS — I10 Essential (primary) hypertension: Secondary | ICD-10-CM | POA: Diagnosis not present

## 2018-10-14 DIAGNOSIS — E1165 Type 2 diabetes mellitus with hyperglycemia: Secondary | ICD-10-CM | POA: Diagnosis not present

## 2018-10-14 DIAGNOSIS — E876 Hypokalemia: Secondary | ICD-10-CM | POA: Diagnosis not present

## 2018-10-17 DIAGNOSIS — R0602 Shortness of breath: Secondary | ICD-10-CM | POA: Diagnosis not present

## 2018-10-17 DIAGNOSIS — E785 Hyperlipidemia, unspecified: Secondary | ICD-10-CM | POA: Diagnosis not present

## 2018-10-17 DIAGNOSIS — M6281 Muscle weakness (generalized): Secondary | ICD-10-CM | POA: Diagnosis not present

## 2018-10-17 DIAGNOSIS — I10 Essential (primary) hypertension: Secondary | ICD-10-CM | POA: Diagnosis not present

## 2018-10-17 DIAGNOSIS — E1165 Type 2 diabetes mellitus with hyperglycemia: Secondary | ICD-10-CM | POA: Diagnosis not present

## 2018-10-17 DIAGNOSIS — E876 Hypokalemia: Secondary | ICD-10-CM | POA: Diagnosis not present

## 2018-10-17 DIAGNOSIS — M1990 Unspecified osteoarthritis, unspecified site: Secondary | ICD-10-CM | POA: Diagnosis not present

## 2018-10-17 DIAGNOSIS — I5020 Unspecified systolic (congestive) heart failure: Secondary | ICD-10-CM | POA: Diagnosis not present

## 2018-10-17 DIAGNOSIS — R2681 Unsteadiness on feet: Secondary | ICD-10-CM | POA: Diagnosis not present

## 2018-10-18 DIAGNOSIS — E785 Hyperlipidemia, unspecified: Secondary | ICD-10-CM | POA: Diagnosis not present

## 2018-10-18 DIAGNOSIS — E1165 Type 2 diabetes mellitus with hyperglycemia: Secondary | ICD-10-CM | POA: Diagnosis not present

## 2018-10-18 DIAGNOSIS — R0602 Shortness of breath: Secondary | ICD-10-CM | POA: Diagnosis not present

## 2018-10-18 DIAGNOSIS — M6281 Muscle weakness (generalized): Secondary | ICD-10-CM | POA: Diagnosis not present

## 2018-10-18 DIAGNOSIS — I10 Essential (primary) hypertension: Secondary | ICD-10-CM | POA: Diagnosis not present

## 2018-10-18 DIAGNOSIS — M1990 Unspecified osteoarthritis, unspecified site: Secondary | ICD-10-CM | POA: Diagnosis not present

## 2018-10-18 DIAGNOSIS — I5020 Unspecified systolic (congestive) heart failure: Secondary | ICD-10-CM | POA: Diagnosis not present

## 2018-10-18 DIAGNOSIS — R2681 Unsteadiness on feet: Secondary | ICD-10-CM | POA: Diagnosis not present

## 2018-10-18 DIAGNOSIS — E876 Hypokalemia: Secondary | ICD-10-CM | POA: Diagnosis not present

## 2018-10-19 DIAGNOSIS — M6281 Muscle weakness (generalized): Secondary | ICD-10-CM | POA: Diagnosis not present

## 2018-10-19 DIAGNOSIS — R0602 Shortness of breath: Secondary | ICD-10-CM | POA: Diagnosis not present

## 2018-10-19 DIAGNOSIS — E1165 Type 2 diabetes mellitus with hyperglycemia: Secondary | ICD-10-CM | POA: Diagnosis not present

## 2018-10-19 DIAGNOSIS — I5020 Unspecified systolic (congestive) heart failure: Secondary | ICD-10-CM | POA: Diagnosis not present

## 2018-10-19 DIAGNOSIS — E876 Hypokalemia: Secondary | ICD-10-CM | POA: Diagnosis not present

## 2018-10-19 DIAGNOSIS — R2681 Unsteadiness on feet: Secondary | ICD-10-CM | POA: Diagnosis not present

## 2018-10-19 DIAGNOSIS — E785 Hyperlipidemia, unspecified: Secondary | ICD-10-CM | POA: Diagnosis not present

## 2018-10-19 DIAGNOSIS — M1990 Unspecified osteoarthritis, unspecified site: Secondary | ICD-10-CM | POA: Diagnosis not present

## 2018-10-19 DIAGNOSIS — I10 Essential (primary) hypertension: Secondary | ICD-10-CM | POA: Diagnosis not present

## 2018-10-22 DIAGNOSIS — I10 Essential (primary) hypertension: Secondary | ICD-10-CM | POA: Diagnosis not present

## 2018-10-22 DIAGNOSIS — R2681 Unsteadiness on feet: Secondary | ICD-10-CM | POA: Diagnosis not present

## 2018-10-22 DIAGNOSIS — M6281 Muscle weakness (generalized): Secondary | ICD-10-CM | POA: Diagnosis not present

## 2018-10-22 DIAGNOSIS — I5020 Unspecified systolic (congestive) heart failure: Secondary | ICD-10-CM | POA: Diagnosis not present

## 2018-10-22 DIAGNOSIS — E876 Hypokalemia: Secondary | ICD-10-CM | POA: Diagnosis not present

## 2018-10-22 DIAGNOSIS — E1165 Type 2 diabetes mellitus with hyperglycemia: Secondary | ICD-10-CM | POA: Diagnosis not present

## 2018-10-22 DIAGNOSIS — E785 Hyperlipidemia, unspecified: Secondary | ICD-10-CM | POA: Diagnosis not present

## 2018-10-22 DIAGNOSIS — R0602 Shortness of breath: Secondary | ICD-10-CM | POA: Diagnosis not present

## 2018-10-22 DIAGNOSIS — M1990 Unspecified osteoarthritis, unspecified site: Secondary | ICD-10-CM | POA: Diagnosis not present

## 2018-10-23 DIAGNOSIS — M1990 Unspecified osteoarthritis, unspecified site: Secondary | ICD-10-CM | POA: Diagnosis not present

## 2018-10-23 DIAGNOSIS — M6281 Muscle weakness (generalized): Secondary | ICD-10-CM | POA: Diagnosis not present

## 2018-10-23 DIAGNOSIS — I5020 Unspecified systolic (congestive) heart failure: Secondary | ICD-10-CM | POA: Diagnosis not present

## 2018-10-23 DIAGNOSIS — E1165 Type 2 diabetes mellitus with hyperglycemia: Secondary | ICD-10-CM | POA: Diagnosis not present

## 2018-10-23 DIAGNOSIS — E785 Hyperlipidemia, unspecified: Secondary | ICD-10-CM | POA: Diagnosis not present

## 2018-10-23 DIAGNOSIS — E876 Hypokalemia: Secondary | ICD-10-CM | POA: Diagnosis not present

## 2018-10-23 DIAGNOSIS — R2681 Unsteadiness on feet: Secondary | ICD-10-CM | POA: Diagnosis not present

## 2018-10-23 DIAGNOSIS — I10 Essential (primary) hypertension: Secondary | ICD-10-CM | POA: Diagnosis not present

## 2018-10-23 DIAGNOSIS — R0602 Shortness of breath: Secondary | ICD-10-CM | POA: Diagnosis not present

## 2018-10-24 DIAGNOSIS — E876 Hypokalemia: Secondary | ICD-10-CM | POA: Diagnosis not present

## 2018-10-24 DIAGNOSIS — R2681 Unsteadiness on feet: Secondary | ICD-10-CM | POA: Diagnosis not present

## 2018-10-24 DIAGNOSIS — E1165 Type 2 diabetes mellitus with hyperglycemia: Secondary | ICD-10-CM | POA: Diagnosis not present

## 2018-10-24 DIAGNOSIS — I5020 Unspecified systolic (congestive) heart failure: Secondary | ICD-10-CM | POA: Diagnosis not present

## 2018-10-24 DIAGNOSIS — R0602 Shortness of breath: Secondary | ICD-10-CM | POA: Diagnosis not present

## 2018-10-24 DIAGNOSIS — M1990 Unspecified osteoarthritis, unspecified site: Secondary | ICD-10-CM | POA: Diagnosis not present

## 2018-10-24 DIAGNOSIS — M6281 Muscle weakness (generalized): Secondary | ICD-10-CM | POA: Diagnosis not present

## 2018-10-24 DIAGNOSIS — E785 Hyperlipidemia, unspecified: Secondary | ICD-10-CM | POA: Diagnosis not present

## 2018-10-24 DIAGNOSIS — I10 Essential (primary) hypertension: Secondary | ICD-10-CM | POA: Diagnosis not present

## 2018-10-25 DIAGNOSIS — E785 Hyperlipidemia, unspecified: Secondary | ICD-10-CM | POA: Diagnosis not present

## 2018-10-25 DIAGNOSIS — M1990 Unspecified osteoarthritis, unspecified site: Secondary | ICD-10-CM | POA: Diagnosis not present

## 2018-10-25 DIAGNOSIS — I10 Essential (primary) hypertension: Secondary | ICD-10-CM | POA: Diagnosis not present

## 2018-10-25 DIAGNOSIS — R2681 Unsteadiness on feet: Secondary | ICD-10-CM | POA: Diagnosis not present

## 2018-10-25 DIAGNOSIS — M6281 Muscle weakness (generalized): Secondary | ICD-10-CM | POA: Diagnosis not present

## 2018-10-25 DIAGNOSIS — I5020 Unspecified systolic (congestive) heart failure: Secondary | ICD-10-CM | POA: Diagnosis not present

## 2018-10-25 DIAGNOSIS — R0602 Shortness of breath: Secondary | ICD-10-CM | POA: Diagnosis not present

## 2018-10-25 DIAGNOSIS — E876 Hypokalemia: Secondary | ICD-10-CM | POA: Diagnosis not present

## 2018-10-25 DIAGNOSIS — E1165 Type 2 diabetes mellitus with hyperglycemia: Secondary | ICD-10-CM | POA: Diagnosis not present

## 2018-10-26 DIAGNOSIS — E785 Hyperlipidemia, unspecified: Secondary | ICD-10-CM | POA: Diagnosis not present

## 2018-10-26 DIAGNOSIS — R0602 Shortness of breath: Secondary | ICD-10-CM | POA: Diagnosis not present

## 2018-10-26 DIAGNOSIS — R2681 Unsteadiness on feet: Secondary | ICD-10-CM | POA: Diagnosis not present

## 2018-10-26 DIAGNOSIS — M6281 Muscle weakness (generalized): Secondary | ICD-10-CM | POA: Diagnosis not present

## 2018-10-26 DIAGNOSIS — E876 Hypokalemia: Secondary | ICD-10-CM | POA: Diagnosis not present

## 2018-10-26 DIAGNOSIS — E1165 Type 2 diabetes mellitus with hyperglycemia: Secondary | ICD-10-CM | POA: Diagnosis not present

## 2018-10-26 DIAGNOSIS — I5020 Unspecified systolic (congestive) heart failure: Secondary | ICD-10-CM | POA: Diagnosis not present

## 2018-10-26 DIAGNOSIS — M1990 Unspecified osteoarthritis, unspecified site: Secondary | ICD-10-CM | POA: Diagnosis not present

## 2018-10-26 DIAGNOSIS — I10 Essential (primary) hypertension: Secondary | ICD-10-CM | POA: Diagnosis not present

## 2018-10-27 DIAGNOSIS — E876 Hypokalemia: Secondary | ICD-10-CM | POA: Diagnosis not present

## 2018-10-27 DIAGNOSIS — M1990 Unspecified osteoarthritis, unspecified site: Secondary | ICD-10-CM | POA: Diagnosis not present

## 2018-10-27 DIAGNOSIS — I10 Essential (primary) hypertension: Secondary | ICD-10-CM | POA: Diagnosis not present

## 2018-10-27 DIAGNOSIS — R2681 Unsteadiness on feet: Secondary | ICD-10-CM | POA: Diagnosis not present

## 2018-10-27 DIAGNOSIS — M6281 Muscle weakness (generalized): Secondary | ICD-10-CM | POA: Diagnosis not present

## 2018-10-27 DIAGNOSIS — I5020 Unspecified systolic (congestive) heart failure: Secondary | ICD-10-CM | POA: Diagnosis not present

## 2018-10-27 DIAGNOSIS — E785 Hyperlipidemia, unspecified: Secondary | ICD-10-CM | POA: Diagnosis not present

## 2018-10-27 DIAGNOSIS — E1165 Type 2 diabetes mellitus with hyperglycemia: Secondary | ICD-10-CM | POA: Diagnosis not present

## 2018-10-27 DIAGNOSIS — R0602 Shortness of breath: Secondary | ICD-10-CM | POA: Diagnosis not present

## 2018-10-28 DIAGNOSIS — R2681 Unsteadiness on feet: Secondary | ICD-10-CM | POA: Diagnosis not present

## 2018-10-28 DIAGNOSIS — I5020 Unspecified systolic (congestive) heart failure: Secondary | ICD-10-CM | POA: Diagnosis not present

## 2018-10-28 DIAGNOSIS — E1165 Type 2 diabetes mellitus with hyperglycemia: Secondary | ICD-10-CM | POA: Diagnosis not present

## 2018-10-28 DIAGNOSIS — R0602 Shortness of breath: Secondary | ICD-10-CM | POA: Diagnosis not present

## 2018-10-28 DIAGNOSIS — I10 Essential (primary) hypertension: Secondary | ICD-10-CM | POA: Diagnosis not present

## 2018-10-28 DIAGNOSIS — E876 Hypokalemia: Secondary | ICD-10-CM | POA: Diagnosis not present

## 2018-10-28 DIAGNOSIS — M6281 Muscle weakness (generalized): Secondary | ICD-10-CM | POA: Diagnosis not present

## 2018-10-28 DIAGNOSIS — E785 Hyperlipidemia, unspecified: Secondary | ICD-10-CM | POA: Diagnosis not present

## 2018-10-28 DIAGNOSIS — M1990 Unspecified osteoarthritis, unspecified site: Secondary | ICD-10-CM | POA: Diagnosis not present

## 2018-10-31 DIAGNOSIS — I5020 Unspecified systolic (congestive) heart failure: Secondary | ICD-10-CM | POA: Diagnosis not present

## 2018-10-31 DIAGNOSIS — R2681 Unsteadiness on feet: Secondary | ICD-10-CM | POA: Diagnosis not present

## 2018-10-31 DIAGNOSIS — I10 Essential (primary) hypertension: Secondary | ICD-10-CM | POA: Diagnosis not present

## 2018-10-31 DIAGNOSIS — M6281 Muscle weakness (generalized): Secondary | ICD-10-CM | POA: Diagnosis not present

## 2018-10-31 DIAGNOSIS — E785 Hyperlipidemia, unspecified: Secondary | ICD-10-CM | POA: Diagnosis not present

## 2018-10-31 DIAGNOSIS — R0602 Shortness of breath: Secondary | ICD-10-CM | POA: Diagnosis not present

## 2018-10-31 DIAGNOSIS — E1165 Type 2 diabetes mellitus with hyperglycemia: Secondary | ICD-10-CM | POA: Diagnosis not present

## 2018-10-31 DIAGNOSIS — M1990 Unspecified osteoarthritis, unspecified site: Secondary | ICD-10-CM | POA: Diagnosis not present

## 2018-10-31 DIAGNOSIS — E876 Hypokalemia: Secondary | ICD-10-CM | POA: Diagnosis not present

## 2018-11-01 DIAGNOSIS — E785 Hyperlipidemia, unspecified: Secondary | ICD-10-CM | POA: Diagnosis not present

## 2018-11-01 DIAGNOSIS — E876 Hypokalemia: Secondary | ICD-10-CM | POA: Diagnosis not present

## 2018-11-01 DIAGNOSIS — I5020 Unspecified systolic (congestive) heart failure: Secondary | ICD-10-CM | POA: Diagnosis not present

## 2018-11-01 DIAGNOSIS — M6281 Muscle weakness (generalized): Secondary | ICD-10-CM | POA: Diagnosis not present

## 2018-11-01 DIAGNOSIS — E1165 Type 2 diabetes mellitus with hyperglycemia: Secondary | ICD-10-CM | POA: Diagnosis not present

## 2018-11-01 DIAGNOSIS — R0602 Shortness of breath: Secondary | ICD-10-CM | POA: Diagnosis not present

## 2018-11-01 DIAGNOSIS — R2681 Unsteadiness on feet: Secondary | ICD-10-CM | POA: Diagnosis not present

## 2018-11-01 DIAGNOSIS — M1990 Unspecified osteoarthritis, unspecified site: Secondary | ICD-10-CM | POA: Diagnosis not present

## 2018-11-01 DIAGNOSIS — I10 Essential (primary) hypertension: Secondary | ICD-10-CM | POA: Diagnosis not present

## 2018-11-02 ENCOUNTER — Ambulatory Visit: Payer: Medicare HMO | Admitting: Family

## 2018-11-02 ENCOUNTER — Ambulatory Visit: Payer: Medicare HMO | Attending: Family | Admitting: Family

## 2018-11-02 ENCOUNTER — Encounter: Payer: Self-pay | Admitting: Family

## 2018-11-02 VITALS — BP 116/67 | HR 65 | Resp 18 | Ht 66.0 in | Wt 274.0 lb

## 2018-11-02 DIAGNOSIS — I10 Essential (primary) hypertension: Secondary | ICD-10-CM

## 2018-11-02 DIAGNOSIS — M1990 Unspecified osteoarthritis, unspecified site: Secondary | ICD-10-CM | POA: Insufficient documentation

## 2018-11-02 DIAGNOSIS — E119 Type 2 diabetes mellitus without complications: Secondary | ICD-10-CM | POA: Diagnosis not present

## 2018-11-02 DIAGNOSIS — Z833 Family history of diabetes mellitus: Secondary | ICD-10-CM | POA: Diagnosis not present

## 2018-11-02 DIAGNOSIS — I110 Hypertensive heart disease with heart failure: Secondary | ICD-10-CM | POA: Insufficient documentation

## 2018-11-02 DIAGNOSIS — Z7984 Long term (current) use of oral hypoglycemic drugs: Secondary | ICD-10-CM | POA: Diagnosis not present

## 2018-11-02 DIAGNOSIS — I5032 Chronic diastolic (congestive) heart failure: Secondary | ICD-10-CM | POA: Insufficient documentation

## 2018-11-02 DIAGNOSIS — Z79899 Other long term (current) drug therapy: Secondary | ICD-10-CM | POA: Insufficient documentation

## 2018-11-02 DIAGNOSIS — E785 Hyperlipidemia, unspecified: Secondary | ICD-10-CM | POA: Diagnosis not present

## 2018-11-02 DIAGNOSIS — Z87891 Personal history of nicotine dependence: Secondary | ICD-10-CM | POA: Diagnosis not present

## 2018-11-02 DIAGNOSIS — Z8673 Personal history of transient ischemic attack (TIA), and cerebral infarction without residual deficits: Secondary | ICD-10-CM | POA: Insufficient documentation

## 2018-11-02 DIAGNOSIS — J441 Chronic obstructive pulmonary disease with (acute) exacerbation: Secondary | ICD-10-CM | POA: Insufficient documentation

## 2018-11-02 DIAGNOSIS — E1165 Type 2 diabetes mellitus with hyperglycemia: Secondary | ICD-10-CM | POA: Diagnosis not present

## 2018-11-02 DIAGNOSIS — Z881 Allergy status to other antibiotic agents status: Secondary | ICD-10-CM | POA: Diagnosis not present

## 2018-11-02 DIAGNOSIS — R0602 Shortness of breath: Secondary | ICD-10-CM | POA: Diagnosis not present

## 2018-11-02 DIAGNOSIS — I5020 Unspecified systolic (congestive) heart failure: Secondary | ICD-10-CM | POA: Diagnosis not present

## 2018-11-02 DIAGNOSIS — Z880 Allergy status to penicillin: Secondary | ICD-10-CM | POA: Insufficient documentation

## 2018-11-02 DIAGNOSIS — E876 Hypokalemia: Secondary | ICD-10-CM | POA: Diagnosis not present

## 2018-11-02 DIAGNOSIS — R2681 Unsteadiness on feet: Secondary | ICD-10-CM | POA: Diagnosis not present

## 2018-11-02 DIAGNOSIS — E669 Obesity, unspecified: Secondary | ICD-10-CM

## 2018-11-02 DIAGNOSIS — M6281 Muscle weakness (generalized): Secondary | ICD-10-CM | POA: Diagnosis not present

## 2018-11-02 DIAGNOSIS — J449 Chronic obstructive pulmonary disease, unspecified: Secondary | ICD-10-CM

## 2018-11-02 DIAGNOSIS — E1169 Type 2 diabetes mellitus with other specified complication: Secondary | ICD-10-CM

## 2018-11-02 NOTE — Patient Instructions (Signed)
Continue weighing daily and call for an overnight weight gain of > 2 pounds or a weekly weight gain of >5 pounds. 

## 2018-11-02 NOTE — Progress Notes (Signed)
Patient ID: Karen Perez, female    DOB: Mar 05, 1951, 67 y.o.   MRN: [id number]  HPI  Karen Perez is a 67 y/o female with a history of DM, asthma, HTN, stroke, COPD, arthritis, recent tobacco use and chronic heart failure.   Echo report from 06/06/18 reviewed and showed an EF of 55-60%. Echo report from 02/23/18 reviewed and showed an EF of 65-70% along with mild TR and normal PA pressure.   Admitted 06/05/18 due to COPD exacerbation. Medications adjusted. Treated also for UTI. Discharged after 6 days. Was in the ED 05/11/18 due to right knee pain where she was treated and released.   She presents today for a follow-up visit with a chief complaint of moderate fatigue upon minimal exertion. She describes this as chronic in nature having been present for several years. She has associated pedal edema and leg pain along with this. She denies any difficulty sleeping, abdominal distention, palpitations, chest pain, shortness of breath, cough or dizziness. Unsure if she's being weighed daily or not.   Past Medical History:  Diagnosis Date  . Arthritis   . Asthma   . Brain aneurysm   . CHF (congestive heart failure) (HCC)   . COPD (chronic obstructive pulmonary disease) (HCC)   . Diabetes mellitus without complication (HCC)   . Hypertension   . Stroke [facility]) 2003   Past Surgical History:  Procedure Laterality Date  . CEREBRAL ANEURYSM REPAIR  2013   Family History  Problem Relation Age of Onset  . Diabetes Mellitus II Daughter    Social History   Tobacco Use  . Smoking status: Former Smoker    Last attempt to quit: 02/15/2018    Years since quitting: 0.7  . Smokeless tobacco: Never Used  Substance Use Topics  . Alcohol use: No   Allergies  Allergen Reactions  . Amoxicillin-Pot Clavulanate Diarrhea    Has patient had a PCN reaction causing immediate rash, facial/tongue/throat swelling, SOB or lightheadedness with hypotension: No Has patient had a PCN reaction causing severe rash  involving mucus membranes or skin necrosis: No Has patient had a PCN reaction that required hospitalization: No Has patient had a PCN reaction occurring within the last 10 years: No If all of the above answers are "NO", then may proceed with Cephalosporin use.   . Neosporin [Neomycin-Bacitracin Zn-Polymyx] Hives   Prior to Admission medications   Medication Sig Start Date End Date Taking? Authorizing Provider  acetaminophen (TYLENOL 8 HOUR ARTHRITIS PAIN) 650 MG CR tablet Take 1,300 mg by mouth every 8 (eight) hours as needed for pain.   Yes [provider]  atorvastatin (LIPITOR) 20 MG tablet Take 1 tablet (20 mg total) by mouth daily. 08/22/18  Yes Lyndon Code, MD  clopidogrel (PLAVIX) 75 MG tablet Take 1 tablet (75 mg total) by mouth daily. 04/25/18  Yes Carlean Jews, NP  clotrimazole (LOTRIMIN) 1 % cream Apply to affected area 2 times daily 05/11/18  Yes Mesner, Barbara Cower, MD  diclofenac sodium (VOLTAREN) 1 % GEL Apply topically 4 (four) times daily.   Yes [provider]  fluticasone (FLONASE) 50 MCG/ACT nasal spray Place 2 sprays into both nostrils daily.   Yes [provider]  Fluticasone-Salmeterol (ADVAIR) 250-50 MCG/DOSE AEPB Inhale 1 puff into the lungs 2 (two) times daily. 12/16/17  Yes Boscia, Kathlynn Grate, NP  furosemide (LASIX) 40 MG tablet Take 1 tablet (40 mg total) by mouth 2 (two) times daily. Patient taking differently: Take 40 mg by mouth  daily.  04/26/18  Yes Lyndon Code, MD  gabapentin (NEURONTIN) 100 MG capsule Take 600 mg by mouth 2 (two) times daily.    Yes [provider]  ipratropium-albuterol (DUONEB) 0.5-2.5 (3) MG/3ML SOLN TAKE 3 MLS BY NEBULIZATION EVERY 4 (FOUR) HOURS AS NEEDED. 04/26/18  Yes Lyndon Code, MD  lidocaine (LIDODERM) 5 % Place 2 patches onto the skin daily. Remove & Discard patch within 12 hours or as directed by MD   Yes [provider]  lisinopril (PRINIVIL,ZESTRIL) 2.5 MG tablet Take 1 tablet (2.5 mg  total) by mouth daily. 03/15/18  Yes Lyndon Code, MD  metFORMIN (GLUCOPHAGE) 500 MG tablet Take 500 mg by mouth daily with breakfast.   Yes [provider]  pantoprazole (PROTONIX) 20 MG tablet Take 20 mg by mouth daily.   Yes [provider]  potassium chloride (K-DUR) 10 MEQ tablet TAKE 2 TABLETS BY MOUTH EVERY DAY Patient taking differently: Take 20 mEq by mouth daily.  05/20/18  Yes Lyndon Code, MD  tiotropium [facility] HANDIHALER) 18 MCG inhalation capsule Use once day 03/23/18  Yes Lyndon Code, MD  traMADol (ULTRAM) 50 MG tablet TAKE 1 TABLET BY MOUTH TWICE A DAY FOR KNEE PAIN AS NEEDED Patient taking differently: every 12 (twelve) hours as needed.  10/28/17  Yes Boscia, Kathlynn Grate, NP  Liniments (SALONPAS PAIN RELIEF PATCH EX) Apply 2 patches topically daily.    [provider]    Review of Systems  Constitutional: Positive for fatigue. Negative for appetite change.  HENT: Positive for rhinorrhea. Negative for congestion and sore throat.   Eyes: Negative.   Respiratory: Negative for cough and shortness of breath.        + snoring  Cardiovascular: Positive for leg swelling. Negative for chest pain and palpitations.  Gastrointestinal: Negative for abdominal distention and abdominal pain.  Endocrine: Negative.   Genitourinary: Negative.   Musculoskeletal: Positive for arthralgias (leg pain due to arthritis). Negative for neck pain.  Skin: Negative.   Allergic/Immunologic: Negative.   Neurological: Negative for dizziness and light-headedness.  Hematological: Negative for adenopathy. Does not bruise/bleed easily.  Psychiatric/Behavioral: Negative for dysphoric mood and sleep disturbance (sleeping on 3 pillows). The patient is not nervous/anxious.    Vitals:   11/02/18 1340  BP: 116/67  Pulse: 65  Resp: 18  SpO2: 92%  Weight: 274 lb (124.3 kg)  Height: 5\' 6"  (1.676 m)   Wt Readings from Last 3 Encounters:  11/02/18 274 lb (124.3 kg)  08/04/18 260 lb  (117.9 kg)  08/03/18 276 lb 4 oz (125.3 kg)   Lab Results  Component Value Date   CREATININE 0.68 03/21/2018   CREATININE 0.66 03/19/2018   CREATININE 0.71 03/02/2018    Physical Exam Vitals signs and nursing note reviewed.  Constitutional:      Appearance: She is well-developed.  HENT:     Head: Normocephalic and atraumatic.  Neck:     Musculoskeletal: Normal range of motion and neck supple.     Vascular: No JVD.  Cardiovascular:     Rate and Rhythm: Normal rate and regular rhythm.  Pulmonary:     Effort: Pulmonary effort is normal. No respiratory distress.     Breath sounds: No wheezing or rales.  Abdominal:     General: There is no distension.     Palpations: Abdomen is soft.  Musculoskeletal:        General: No tenderness.     Right lower leg: Edema (trace) present.  Left lower leg: Edema (trace) present.  Skin:    General: Skin is warm and dry.  Psychiatric:        Behavior: Behavior normal.    Assessment & Plan:  1: Chronic heart failure with preserved ejection fraction- - NYHA class III - euvolemic today - patient doesn't think she's being weighed daily at [facility]. Order written for them to weigh her daily and call for an overnight weight gain of >2 pounds or a weekly weight gain of >5 pounds - unable to be weighed in the office as she says that she can't stand herself up - not adding salt to her food. Reviewed the importance of following a 2000mg  sodium diet once she returns home.  - finished PT and is now doing exercises on her own, also wheels herself to dining room and does walk short distances - wearing TED hose now - had home sleep study done 05/12/18; titration test at sleep lab completed 08/04/18 - BNP 06/15/18 was 27.0 - has not gotten flu vaccine yet  2: HTN- - BP looks good today - sees PCP at [facility] - BMP 03/21/18 reviewed and showed sodium 143, potassium 3.3 and GFR >60  3: COPD- - saw pulmonology Clent Ridges([place]) 07/04/18  4: Diabetes- -  glucose at facility was 103 - A1c on 02/23/18 was 5.6%  Facility medication list was reviewed.  Return in 4 months or sooner for any questions/problems before then.

## 2018-11-03 ENCOUNTER — Encounter: Payer: Self-pay | Admitting: Family

## 2018-11-04 ENCOUNTER — Ambulatory Visit: Payer: Medicare HMO | Admitting: Pulmonary Disease

## 2018-11-04 DIAGNOSIS — E876 Hypokalemia: Secondary | ICD-10-CM | POA: Diagnosis not present

## 2018-11-04 DIAGNOSIS — R0602 Shortness of breath: Secondary | ICD-10-CM | POA: Diagnosis not present

## 2018-11-04 DIAGNOSIS — M1990 Unspecified osteoarthritis, unspecified site: Secondary | ICD-10-CM | POA: Diagnosis not present

## 2018-11-04 DIAGNOSIS — E1165 Type 2 diabetes mellitus with hyperglycemia: Secondary | ICD-10-CM | POA: Diagnosis not present

## 2018-11-04 DIAGNOSIS — R2681 Unsteadiness on feet: Secondary | ICD-10-CM | POA: Diagnosis not present

## 2018-11-04 DIAGNOSIS — I10 Essential (primary) hypertension: Secondary | ICD-10-CM | POA: Diagnosis not present

## 2018-11-04 DIAGNOSIS — I5020 Unspecified systolic (congestive) heart failure: Secondary | ICD-10-CM | POA: Diagnosis not present

## 2018-11-04 DIAGNOSIS — E785 Hyperlipidemia, unspecified: Secondary | ICD-10-CM | POA: Diagnosis not present

## 2018-11-04 DIAGNOSIS — M6281 Muscle weakness (generalized): Secondary | ICD-10-CM | POA: Diagnosis not present

## 2018-11-12 DIAGNOSIS — E785 Hyperlipidemia, unspecified: Secondary | ICD-10-CM | POA: Diagnosis not present

## 2018-11-12 DIAGNOSIS — M25569 Pain in unspecified knee: Secondary | ICD-10-CM | POA: Diagnosis not present

## 2018-11-12 DIAGNOSIS — I509 Heart failure, unspecified: Secondary | ICD-10-CM | POA: Diagnosis not present

## 2018-11-12 DIAGNOSIS — I10 Essential (primary) hypertension: Secondary | ICD-10-CM | POA: Diagnosis not present

## 2018-11-12 DIAGNOSIS — E119 Type 2 diabetes mellitus without complications: Secondary | ICD-10-CM | POA: Diagnosis not present

## 2018-11-12 DIAGNOSIS — J449 Chronic obstructive pulmonary disease, unspecified: Secondary | ICD-10-CM | POA: Diagnosis not present

## 2018-11-12 DIAGNOSIS — Z8673 Personal history of transient ischemic attack (TIA), and cerebral infarction without residual deficits: Secondary | ICD-10-CM | POA: Diagnosis not present

## 2018-11-23 ENCOUNTER — Encounter: Payer: Medicaid Other | Admitting: Acute Care

## 2018-11-23 ENCOUNTER — Ambulatory Visit: Payer: Medicare HMO | Admitting: Pulmonary Disease

## 2019-03-02 ENCOUNTER — Ambulatory Visit: Payer: Medicare HMO | Admitting: Family

## 2019-03-20 ENCOUNTER — Other Ambulatory Visit: Payer: Self-pay

## 2019-03-20 ENCOUNTER — Encounter: Payer: Self-pay | Admitting: Acute Care

## 2019-03-20 ENCOUNTER — Ambulatory Visit (INDEPENDENT_AMBULATORY_CARE_PROVIDER_SITE_OTHER): Payer: Medicare Other | Admitting: Acute Care

## 2019-03-20 ENCOUNTER — Telehealth: Payer: Self-pay | Admitting: Acute Care

## 2019-03-20 VITALS — BP 122/70 | HR 85 | Temp 98.4°F | Ht 66.0 in

## 2019-03-20 DIAGNOSIS — G4733 Obstructive sleep apnea (adult) (pediatric): Secondary | ICD-10-CM

## 2019-03-20 DIAGNOSIS — J209 Acute bronchitis, unspecified: Secondary | ICD-10-CM | POA: Diagnosis not present

## 2019-03-20 DIAGNOSIS — J440 Chronic obstructive pulmonary disease with acute lower respiratory infection: Secondary | ICD-10-CM | POA: Diagnosis not present

## 2019-03-20 DIAGNOSIS — J449 Chronic obstructive pulmonary disease, unspecified: Secondary | ICD-10-CM

## 2019-03-20 NOTE — Patient Instructions (Addendum)
It is good  to see you today. We will make sure the DME who is providing your CPAP device knows where you are living. Please arrange for CPAP 17 cm H2O with heated humidity and mask of choice. Have 1 liter oxygen added at night with CPAP.  Continue wearing your oxygen at bedtime as you have been doing until you get your  CPAP device. [facility] has been trying to contact patient to set up CPAP  When you get your CPAP machine:   Start  on CPAP at bedtime.  Goal is to wear for at least 6 hours each night for maximal clinical benefit. Continue to work on weight loss, as the link between excess weight  and sleep apnea is well established.   Remember to establish a good bedtime routine, and work on sleep hygiene.  Limit daytime naps , avoid stimulants such as caffeine and nicotine close to bedtime, exercise daily to promote sleep quality, avoid heavy , spicy, fried , or rich foods before bed. Ensure adequate exposure to natural light during the day,establish a relaxing bedtime routine with a pleasant sleep environment ( Bedroom between 60 and 67 degrees, turn off bright lights , TV or device screens screens , consider black out curtains or white noise machines) Do not drive if sleepy. Remember to clean mask, tubing, filter, and reservoir once weekly with soapy water.  Follow up with Dr. Craige Cotta or [name] NP   In 30-45 days after you start your CPAP therapy .  Pt must get her Advair Discus BID without fail Start Duonebs BID scheduled without fail. Start Claritin 10 mg daily for allergies Start Prednisone 20 mg x 3 days, then 10 mg x 3  days then stop Patient  # [phone] Tele visit in 1 week to ensure you are better. This will be Monday 03/27/2019 at 2 pm. Please contact office for sooner follow up if symptoms do not improve or worsen or seek emergency care   Continue to self isolate, wash hand frequently, and wear a face mask if you leave your home. Remember to utilize the early hours at  grocery stores/ drug stores  for people > 73 years old or any underlying health risks, as the stores are cleaner and less crowded at these times.

## 2019-03-20 NOTE — Assessment & Plan Note (Signed)
Currently not getting Duonebs or Advair per patient( Nursing home patient) Flare of COPD with worsening allergies Plan: Pt must get her Advair Discus BID without fail Start Duonebs BID scheduled without fail. Rinse mouth after use Start Claritin 10 mg daily for allergies Start Prednisone 20 mg x 3 days, then 10 mg x 3  days then stop Monitor blood sugars while on prednisone Patient  # [phone]>> Call for follow up tele visit  Tele visit in 1 week to ensure you are better. This will be Monday 03/27/2019 at 2 pm. Please contact office for sooner follow up if symptoms do not improve or worsen or seek emergency care

## 2019-03-20 NOTE — Assessment & Plan Note (Signed)
Has not started on CPAP Plan We will make sure the DME who is providing your CPAP device knows where you are living. Please arrange for CPAP 17 cm H2O with heated humidity and mask of choice. Have 1 liter oxygen added at night with CPAP.  Continue wearing your oxygen at bedtime as you have been doing until you get your  CPAP device. [facility] has been trying to contact patient to set up CPAP  When you get your CPAP machine:   Start  on CPAP at bedtime.  Goal is to wear for at least 6 hours each night for maximal clinical benefit. Continue to work on weight loss, as the link between excess weight  and sleep apnea is well established.   Remember to establish a good bedtime routine, and work on sleep hygiene.  Limit daytime naps , avoid stimulants such as caffeine and nicotine close to bedtime, exercise daily to promote sleep quality, avoid heavy , spicy, fried , or rich foods before bed. Ensure adequate exposure to natural light during the day,establish a relaxing bedtime routine with a pleasant sleep environment ( Bedroom between 60 and 67 degrees, turn off bright lights , TV or device screens screens , consider black out curtains or white noise machines) Do not drive if sleepy. Remember to clean mask, tubing, filter, and reservoir once weekly with soapy water.  Follow up with Dr. Craige Cotta or [name] NP   In 30-45 days after you start your CPAP therapy .

## 2019-03-20 NOTE — Telephone Encounter (Signed)
[facility] at [facility] [phone]. Patient is located on the [address].

## 2019-03-20 NOTE — Telephone Encounter (Signed)
Patient was seen today in office by SG. Patient was supposed to be setup on a CPAP machine and she stated that she has not received any information. I called and spoke with a rep at [facility] who stated that they have called the patient several times and could not get in a touch with her. After investigating, it appears that the patient has moved into a long term care facility since her last visit.   I called [facility] to see if they would be able to service the patient since she was in a facility, they stated that they could not. Since she is in a LTC facility, they will have to void her order completely. I was advised to contact the facility to get patient setup with a CPAP machine.   I called and spoke with patient's nurse at [facility], [place]. She stated that she did not know if the facility would be able to supply her with a CPAP machine. She advised me to call back in the AM and ask to speak with [name] directly as she already left for the day.   Will route this message to myself so that I can follow up on the CPAP machine in the AM.

## 2019-03-20 NOTE — Progress Notes (Signed)
History of Present Illness Karen Perez is a 68 y.o. female former smoker ( Quit 02/2018) with OSA, COPD, respiratory failure, nocturnal hypoxemia,  and VCD. She is followed by Dr. Craige Cotta.  Synopsis 68 year old female, former smoker. Hx OSA, COPD, resp failure, VCD and nocturnal hypoxemia ( Nocturnal oxygen at 2 L ) . Hospitalized in May for COPD exac. Patient had not used CPAP since being discharged from nursing home a few years ago and sent machine back. Maintained on Advair, Spiriva and prn duoneb. Completed HST showing AHI 13.3 with low 02. CPAP  titration study was done 07/2018> recommendation are as follows   CPAP needs per titration study 08/14/2018 CPAP 08/04/18 >> CPAP 17 cm H2O with 1 liter O2 >> AHI 11   03/20/2019 Pt. Presents for follow up appointment.  Pt has not yet received her CPAP device. We are unsure if this is due to the patient now living in a nursing home in [place]. As she has not started her CPAP therapy, we will re-schedule her 30-45 days after she begins therapy . We will make sure DME has her address and contact information.  Of note, I tried to call the facility today while the patient was in the office 3 separate times and there was a voice recording that kept cycling without any human communication. This may be part of the problem with the DME's ability to communicate with the patient.  She presents today with audible expiratory wheezing. She states this started about 2 weeks ago when the pollen got worse. She states she was initially coughing up some yellowish secretions but that her secretions have cleared.She lives in a nursing home and cannot tell me for sure if she is getting her medications.She is supposed to be taking Advair discus. We reviewed her meds and she states that she is not getting the Advair discus. She states she is getting her Spiriva [employer]. She is not getting her Duoneb neb treatments either. She does appear to be getting her Lasix as she was  incontinent of urine during the visit today. She denies fever, chest pain, orthopnea or hemoptysis. She does not endorse shortness of breath, but she is wheelchair bound and is very deconditioned.  Pt. States she has daytime sleepiness, and that she needs her CPAP machine.   Test Results: Pulmonary tests: PFT 08/01/09 >> FEV1 1.21 (51%), FEV1% 70, TLC 3.48 (68%), DLCO 42% CT angio chest 02/23/18 >> centrilobular emphysema CTA 03/20/18>>No pulmonary embolus identified. Enlarged main pulmonary artery compatible pulmonary artery Hypertension. Mild to moderate cardiomegaly.   Sleep tests: PSG 04/14/04 > RDI 20 HST 05/12/18 >>AHI 13.3, SaO2 low 65%. Spent 223 min with SaO2 <90%. Needed sleep titration study.   Cardiac tests: Echo 02/23/18 >> EF 65 to 70%, grade 1 DD   CBC Latest Ref Rng & Units 03/19/2018 03/02/2018 03/01/2018  WBC 4.0 - 10.5 K/uL 5.1 13.4(H) 11.7(H)  Hemoglobin 12.0 - 15.0 g/dL 49.7 15.5(H) 15.9(H)  Hematocrit 36.0 - 46.0 % 45.0 48.2(H) 49.8(H)  Platelets 150 - 400 K/uL 201 226 215    BMP Latest Ref Rng & Units 03/21/2018 03/19/2018 03/02/2018  Glucose 65 - 99 mg/dL 026(V) 785(Y) 850(Y)  BUN 6 - 20 mg/dL 12 9 77(A)  Creatinine 0.44 - 1.00 mg/dL 1.28 7.86 7.67  Sodium 135 - 145 mmol/L 143 146(H) 134(L)  Potassium 3.5 - 5.1 mmol/L 3.3(L) 3.5 3.3(L)  Chloride 101 - 111 mmol/L 104 107 88(L)  CO2 22 - 32  mmol/L 30 28 36(H)  Calcium 8.9 - 10.3 mg/dL 2.1(H8.8(L) 9.1 0.8(M8.6(L)    BNP    Component Value Date/Time   BNP 27.0 06/15/2018 0400    ProBNP No results found for: PROBNP  PFT No results found for: FEV1PRE, FEV1POST, FVCPRE, FVCPOST, TLC, DLCOUNC, PREFEV1FVCRT, PSTFEV1FVCRT  No results found.   Past medical hx Past Medical History:  Diagnosis Date  . Arthritis   . Asthma   . Brain aneurysm   . CHF (congestive heart failure) (HCC)   . COPD (chronic obstructive pulmonary disease) (HCC)   . Diabetes mellitus without complication (HCC)   . Hypertension   . Stroke  [facility](HCC) 2003     Social History   Tobacco Use  . Smoking status: Former Smoker    Last attempt to quit: 02/15/2018    Years since quitting: 1.0  . Smokeless tobacco: Never Used  Substance Use Topics  . Alcohol use: No  . Drug use: No    [name] reports that she quit smoking about 13 months ago. She has never used smokeless tobacco. She reports that she does not drink alcohol or use drugs.  Tobacco Cessation: Former smoker Quit 02/2018  Past surgical hx, Family hx, Social hx all reviewed.  Current Outpatient Medications on File Prior to Visit  Medication Sig  . atorvastatin (LIPITOR) 20 MG tablet Take 1 tablet (20 mg total) by mouth daily.  . clopidogrel (PLAVIX) 75 MG tablet Take 1 tablet (75 mg total) by mouth daily.  . clotrimazole (LOTRIMIN) 1 % cream Apply to affected area 2 times daily  . diclofenac sodium (VOLTAREN) 1 % GEL Apply topically 4 (four) times daily.  . fluticasone (FLONASE) 50 MCG/ACT nasal spray Place 2 sprays into both nostrils daily.  . Fluticasone-Salmeterol (ADVAIR) 250-50 MCG/DOSE AEPB Inhale 1 puff into the lungs 2 (two) times daily.  . furosemide (LASIX) 40 MG tablet Take 1 tablet (40 mg total) by mouth 2 (two) times daily. (Patient taking differently: Take 40 mg by mouth daily. )  . gabapentin (NEURONTIN) 100 MG capsule Take 600 mg by mouth 2 (two) times daily.   Marland Kitchen. lidocaine (LIDODERM) 5 % Place 2 patches onto the skin daily. Remove & Discard patch within 12 hours or as directed by MD  . lisinopril (PRINIVIL,ZESTRIL) 2.5 MG tablet Take 1 tablet (2.5 mg total) by mouth daily.  . metFORMIN (GLUCOPHAGE) 500 MG tablet Take 500 mg by mouth daily with breakfast.  . pantoprazole (PROTONIX) 20 MG tablet Take 20 mg by mouth daily.  . potassium chloride (K-DUR) 10 MEQ tablet TAKE 2 TABLETS BY MOUTH EVERY DAY (Patient taking differently: Take 20 mEq by mouth daily. )  . tiotropium (SPIRIVA HANDIHALER) 18 MCG inhalation capsule Use once day  . traMADol (ULTRAM) 50  MG tablet TAKE 1 TABLET BY MOUTH TWICE A DAY FOR KNEE PAIN AS NEEDED (Patient taking differently: every 12 (twelve) hours as needed. )  . acetaminophen (TYLENOL 8 HOUR ARTHRITIS PAIN) 650 MG CR tablet Take 1,300 mg by mouth every 8 (eight) hours as needed for pain.   No current facility-administered medications on file prior to visit.      Allergies  Allergen Reactions  . Amoxicillin-Pot Clavulanate Diarrhea    Has patient had a PCN reaction causing immediate rash, facial/tongue/throat swelling, SOB or lightheadedness with hypotension: No Has patient had a PCN reaction causing severe rash involving mucus membranes or skin necrosis: No Has patient had a PCN reaction that required hospitalization: No Has patient had a  PCN reaction occurring within the last 10 years: No If all of the above answers are "NO", then may proceed with Cephalosporin use.   . Neosporin [Neomycin-Bacitracin Zn-Polymyx] Hives    Review Of Systems:  Constitutional:   No  weight loss, night sweats,  Fevers, chills, fatigue, or  lassitude.  HEENT:   No headaches,  Difficulty swallowing,  Tooth/dental problems, or  Sore throat,                No sneezing, itching, ear ache, nasal congestion, +post nasal drip,   CV:  No chest pain,  Orthopnea, PND, +swelling in lower extremities, anasarca, dizziness, palpitations, syncope.   GI  No heartburn, indigestion, abdominal pain, nausea, vomiting, diarrhea, change in bowel habits, loss of appetite, bloody stools.   Resp: + shortness of breath with exertion or at rest.  + excess mucus, + productive cough,  No non-productive cough,  No coughing up of blood.  No change in color of mucus.  + wheezing.  No chest wall deformity  Skin: no rash or lesions.  GU: no dysuria, change in color of urine, no urgency or frequency.  No flank pain, no hematuria   MS:  No joint pain or swelling.  No decreased range of motion.  No back pain.  Psych:  No change in mood or affect. No depression  or anxiety.  No memory loss.   Vital Signs BP 122/70   Pulse 85   Temp 98.4 F (36.9 C) (Oral)   Ht  (1.676 m)   SpO2 92%   BMI 44.22 kg/m    Physical Exam:  General- No distress,  A&Ox3, overweigh female in wheelchair on RA ENT: No sinus tenderness, TM clear, pale nasal mucosa, no oral exudate,no post nasal drip, no LAN Cardiac: S1, S2, regular rate and rhythm, no murmur Chest: + exp  wheeze/ no rales/ no dullness; no accessory muscle use, no nasal flaring, no sternal retractions Abd.: Soft Non-tender, ND, BS +, Body mass index is 44.22 kg/m. Ext: No clubbing cyanosis, 1 + LE edema Neuro:  NCAT, normal strength, MAE x 4, A&O x 3 Skin: No rashes, no lesions, warm and dry Psych: normal mood and behavior   Assessment/Plan  OBSTRUCTIVE SLEEP APNEA Has not started on CPAP Plan We will make sure the DME who is providing your CPAP device knows where you are living. Please arrange for CPAP 17 cm H2O with heated humidity and mask of choice. Have 1 liter oxygen added at night with CPAP.  Continue wearing your oxygen at bedtime as you have been doing until you get your  CPAP device. [facility] has been trying to contact patient to set up CPAP  When you get your CPAP machine:   Start  on CPAP at bedtime.  Goal is to wear for at least 6 hours each night for maximal clinical benefit. Continue to work on weight loss, as the link between excess weight  and sleep apnea is well established.   Remember to establish a good bedtime routine, and work on sleep hygiene.  Limit daytime naps , avoid stimulants such as caffeine and nicotine close to bedtime, exercise daily to promote sleep quality, avoid heavy , spicy, fried , or rich foods before bed. Ensure adequate exposure to natural light during the day,establish a relaxing bedtime routine with a pleasant sleep environment ( Bedroom between 60 and 67 degrees, turn off bright lights , TV or device screens screens , consider black out  curtains  or white noise machines) Do not drive if sleepy. Remember to clean mask, tubing, filter, and reservoir once weekly with soapy water.  Follow up with Dr. Craige Cotta or [name] NP   In 30-45 days after you start your CPAP therapy .   COPD (chronic obstructive pulmonary disease) (HCC) Currently not getting Duonebs or Advair per patient( Nursing home patient) Flare of COPD with worsening allergies Plan: Pt must get her Advair Discus BID without fail Start Duonebs BID scheduled without fail. Rinse mouth after use Start Claritin 10 mg daily for allergies Start Prednisone 20 mg x 3 days, then 10 mg x 3  days then stop Monitor blood sugars while on prednisone Patient  # [phone]>> Call for follow up tele visit  Tele visit in 1 week to ensure you are better. This will be Monday 03/27/2019 at 2 pm. Please contact office for sooner follow up if symptoms do not improve or worsen or seek emergency care      Bevelyn Ngo, NP 03/20/2019  3:23 PM

## 2019-03-23 NOTE — Telephone Encounter (Signed)
Called to speak with [name]. She stated that the patient was provided a cpap machine about 2 days ago. Per [name], the patient has been sleeping well with the machine. She also had questions about the 1 wk televisit that the patient has for May 11th. She wanted to know if the facility needed to do anything extra. I advised her that while the patient was here in the office, she provided her [place] with her cell phone number and wanted [place] to call her on that number. She verbalized understanding.   Will route this message over to [name] to let her know that the patient has a cpap machine at her facility.

## 2019-03-23 NOTE — Telephone Encounter (Signed)
Thanks so much Cherina. 

## 2019-03-27 ENCOUNTER — Ambulatory Visit (INDEPENDENT_AMBULATORY_CARE_PROVIDER_SITE_OTHER): Payer: Medicare Other | Admitting: Acute Care

## 2019-03-27 ENCOUNTER — Other Ambulatory Visit: Payer: Self-pay

## 2019-03-27 ENCOUNTER — Encounter: Payer: Self-pay | Admitting: Acute Care

## 2019-03-27 VITALS — BP 140/78 | HR 72 | Temp 98.1°F | Ht 66.0 in | Wt 284.0 lb

## 2019-03-27 DIAGNOSIS — J209 Acute bronchitis, unspecified: Secondary | ICD-10-CM

## 2019-03-27 DIAGNOSIS — G4733 Obstructive sleep apnea (adult) (pediatric): Secondary | ICD-10-CM

## 2019-03-27 DIAGNOSIS — Z9989 Dependence on other enabling machines and devices: Secondary | ICD-10-CM

## 2019-03-27 DIAGNOSIS — J440 Chronic obstructive pulmonary disease with acute lower respiratory infection: Secondary | ICD-10-CM | POA: Diagnosis not present

## 2019-03-27 NOTE — Progress Notes (Signed)
History of Present Illness Karen Perez is a 68 y.o. female  former smoker ( Quit 02/2018) with OSA, COPD, respiratory failure, nocturnal hypoxemia,  and VCD. She is followed by Dr. Craige Cotta  Patient resides at [facility] She lives in the [address]  2 week Follow up  03/27/2019  Pt. Presented to the office 03/20/2019  For OSA compliance check.She had not received her CPAP machine. We are unsure if the reason for this was that she had been moved to an LTAC. The DME did not have capability of delivering a CPAP machine to the patient's LTAC. However, the facility has provided the patient with a CPAP machine. She is also on nocturnal oxygen, which will need to be bled into her CPAP machine.There were additional issues with a question of whether the patient was receiving her DuoNebs and Advair for her COPD at the facility as patient  was wheezing at the OV, and patient didn't recall receiving either. Per the nursing staff at [facility], the patient is receiving the medications. Pt. Presents today for 1 week follow up. I wanted to ensure she was getting her CPAP therapy, and nocturnal oxygen,  and all  COPD medications. Per the patient she was able to use her CPAP machine x 1 night, and since then there has been nobody who has been able to set up the machine for the patient to use.She has used it once in the last week.  She still has not received her Advair Discus  or her Duonebs treatments.She states she has been told they have been ordered. Pt. States she completed the prednisone taper that was ordered. She states that the prednisone helped. She does continue to wheeze with exertion, usually expiratory in nature.Saturation was 94% on RA today. She states she has been taken off her Lasix. She does not think she is getting her Claritin daily.  We have called the facility. The patient is getting Combivent and Spiriva. DuoNebs are on hold until [facility] Concerns are resolved. I  will add an Albuterol inhaler prn for shortness of breath of wheezing.   We have explained that there needs to be someone who knows how to manage CPAP therapy there to place the patient on her CPAP machine every night without fail.  Test Results: Pulmonary Tests PFT 08/01/09 >> FEV1 1.21 (51%), FEV1% 70, TLC 3.48 (68%), DLCO 42% CT angio chest 02/23/18 >> centrilobular emphysema CTA 03/20/18>>No pulmonary embolus identified. Enlarged main pulmonary artery compatible pulmonary artery Hypertension. Mild to moderate cardiomegaly.  Sleep  Tests PSG 04/14/04 > RDI 20 HST 05/12/18 >>AHI 13.3, SaO2 low 65%. Spent 223 min with SaO2 <90%.Needed sleep titration study.  Cardiac Tests Echo 02/23/18 >> EF 65 to 70%, grade 1 DD   CBC Latest Ref Rng & Units 03/19/2018 03/02/2018 03/01/2018  WBC 4.0 - 10.5 K/uL 5.1 13.4(H) 11.7(H)  Hemoglobin 12.0 - 15.0 g/dL 16.1 15.5(H) 15.9(H)  Hematocrit 36.0 - 46.0 % 45.0 48.2(H) 49.8(H)  Platelets 150 - 400 K/uL 201 226 215    BMP Latest Ref Rng & Units 03/21/2018 03/19/2018 03/02/2018  Glucose 65 - 99 mg/dL 096(E) 454(U) 981(X)  BUN 6 - 20 mg/dL 12 9 91(Y)  Creatinine 0.44 - 1.00 mg/dL 7.82 9.56 2.13  Sodium 135 - 145 mmol/L 143 146(H) 134(L)  Potassium 3.5 - 5.1 mmol/L 3.3(L) 3.5 3.3(L)  Chloride 101 - 111 mmol/L 104 107 88(L)  CO2 22 - 32 mmol/L 30 28 36(H)  Calcium 8.9 - 10.3  mg/dL 1.1(B8.8(L) 9.1 1.4(N8.6(L)    BNP    Component Value Date/Time   BNP 27.0 06/15/2018 0400    ProBNP No results found for: PROBNP  PFT No results found for: FEV1PRE, FEV1POST, FVCPRE, FVCPOST, TLC, DLCOUNC, PREFEV1FVCRT, PSTFEV1FVCRT  No results found.   Past medical hx Past Medical History:  Diagnosis Date  . Arthritis   . Asthma   . Brain aneurysm   . CHF (congestive heart failure) (HCC)   . COPD (chronic obstructive pulmonary disease) (HCC)   . Diabetes mellitus without complication (HCC)   . Hypertension   . Stroke [facility](HCC) 2003     Social History   Tobacco Use   . Smoking status: Former Smoker    Packs/day: 1.00    Years: 50.00    Pack years: 50.00    Types: Cigarettes    Last attempt to quit: 02/15/2018    Years since quitting: 1.1  . Smokeless tobacco: Never Used  Substance Use Topics  . Alcohol use: No  . Drug use: No    Karen Perez reports that she quit smoking about 13 months ago. Her smoking use included cigarettes. She has a 50.00 pack-year smoking history. She has never used smokeless tobacco. She reports that she does not drink alcohol or use drugs.  Tobacco Cessation: Former smoker quit 02/2018  Past surgical hx, Family hx, Social hx all reviewed.  Current Outpatient Medications on File Prior to Visit  Medication Sig  . acetaminophen (TYLENOL 8 HOUR ARTHRITIS PAIN) 650 MG CR tablet Take 1,300 mg by mouth every 8 (eight) hours as needed for pain.  Marland Kitchen. atorvastatin (LIPITOR) 20 MG tablet Take 1 tablet (20 mg total) by mouth daily.  . clopidogrel (PLAVIX) 75 MG tablet Take 1 tablet (75 mg total) by mouth daily.  . clotrimazole (LOTRIMIN) 1 % cream Apply to affected area 2 times daily  . diclofenac sodium (VOLTAREN) 1 % GEL Apply topically 4 (four) times daily.  . fluticasone (FLONASE) 50 MCG/ACT nasal spray Place 2 sprays into both nostrils daily.  . Fluticasone-Salmeterol (ADVAIR) 250-50 MCG/DOSE AEPB Inhale 1 puff into the lungs 2 (two) times daily.  . furosemide (LASIX) 40 MG tablet Take 1 tablet (40 mg total) by mouth 2 (two) times daily. (Patient taking differently: Take 40 mg by mouth daily. )  . gabapentin (NEURONTIN) 100 MG capsule Take 600 mg by mouth 2 (two) times daily.   Marland Kitchen. lidocaine (LIDODERM) 5 % Place 2 patches onto the skin daily. Remove & Discard patch within 12 hours or as directed by MD  . lisinopril (PRINIVIL,ZESTRIL) 2.5 MG tablet Take 1 tablet (2.5 mg total) by mouth daily.  . metFORMIN (GLUCOPHAGE) 500 MG tablet Take 500 mg by mouth daily with breakfast.  . pantoprazole (PROTONIX) 20 MG tablet Take 20 mg by mouth  daily.  . potassium chloride (K-DUR) 10 MEQ tablet TAKE 2 TABLETS BY MOUTH EVERY DAY (Patient taking differently: Take 20 mEq by mouth daily. )  . tiotropium (SPIRIVA HANDIHALER) 18 MCG inhalation capsule Use once day  . traMADol (ULTRAM) 50 MG tablet TAKE 1 TABLET BY MOUTH TWICE A DAY FOR KNEE PAIN AS NEEDED (Patient taking differently: every 12 (twelve) hours as needed. )   No current facility-administered medications on file prior to visit.      Allergies  Allergen Reactions  . Amoxicillin-Pot Clavulanate Diarrhea    Has patient had a PCN reaction causing immediate rash, facial/tongue/throat swelling, SOB or lightheadedness with hypotension: No Has patient had a PCN reaction  causing severe rash involving mucus membranes or skin necrosis: No Has patient had a PCN reaction that required hospitalization: No Has patient had a PCN reaction occurring within the last 10 years: No If all of the above answers are "NO", then may proceed with Cephalosporin use.   . Neosporin [Neomycin-Bacitracin Zn-Polymyx] Hives    Review Of Systems:  Constitutional:   No  weight loss, night sweats,  Fevers, chills, fatigue, or  lassitude.  HEENT:   No headaches,  Difficulty swallowing,  Tooth/dental problems, or  Sore throat,                No sneezing, itching, ear ache, nasal congestion, post nasal drip, dry cough  CV:  No chest pain,  Orthopnea, PND, swelling in lower extremities, anasarca, dizziness, palpitations, syncope.   GI  No heartburn, indigestion, abdominal pain, nausea, vomiting, diarrhea, change in bowel habits, loss of appetite, bloody stools.   Resp: + shortness of breath with exertion or not at at rest.  No excess mucus, no productive cough,  + non-productive cough,  No coughing up of blood.  No change in color of mucus.  + wheezing with activity.  No chest wall deformity  Skin: no rash or lesions.  GU: no dysuria, change in color of urine, no urgency or frequency.  No flank pain, no  hematuria   MS:  No joint pain or swelling.  + decreased range of motion. Wheel Chair bound/ Stands with walker + back pain.  Psych:  No change in mood or affect. No depression or anxiety.  No memory loss.   Vital Signs BP 140/78 (BP Location: Left Arm, Patient Position: Sitting, Cuff Size: Large)   Pulse 72   Temp 98.1 F (36.7 C)   Ht 5\' 6"  (1.676 m)   Wt 284 lb (128.8 kg)   SpO2 94%   BMI 45.84 kg/m    Physical Exam:  General- No distress,  A&Ox3, pleasant and appropriate ENT: No sinus tenderness, TM clear, pale nasal mucosa, no oral exudate,+ post nasal drip, no LAN Cardiac: S1, S2, regular rate and rhythm, no murmur Chest: + Exp. wheeze/ No rales/ dullness; no accessory muscle use, no nasal flaring, no sternal retractions Abd.: Soft Non-tender, ND BS +, Obese, Body mass index is 45.84 kg/m. Ext: No clubbing cyanosis, trace LE edema, no obvious deformities Neuro:  A&O x 3, MAE x 4, Follows commands and is appropriate Skin: No rashes, warm and dry, no lesions noted Psych: normal mood and behavior   Assessment/Plan OBSTRUCTIVE SLEEP APNEA CPAP provided by LTAC Nocturnal Hypoxemia Plan Please Enroll in [facility] If unable to enroll in Air view, please make sure Sim Card from machine is brought to the next visit. We will call the nursing home to make sure they understand that the patient need her CPAP set up every night without fail. Maintain Current settings of  CPAP 17 cm H2O with heated humidity and mask of choice.  Ensure  1 liter oxygen is added at night with CPAP.  Wear your oxygen at night if the Nursing home does not place you on your CPAP machine. Continue on CPAP at bedtime every night without fail. Goal is to wear for at least 6 hours each night for maximal clinical benefit. Continue to work on weight loss, as the link between excess weight  and sleep apnea is well established.  Remember to establish a good bedtime routine, and work on sleep hygiene. Limit  daytime naps , avoid stimulants such  as caffeine and nicotine close to bedtime, exercise daily to promote sleep quality, avoid heavy , spicy, fried , or rich foods before bed. Ensure adequate exposure to natural light during the day,establish a relaxing bedtime routine with a pleasant sleep environment ( Bedroom between 60 and 67 degrees, turn off bright lights , TV or device screens screens , consider black out curtains or white noise machines) Do not drive if sleepy. Remember to clean mask, tubing, filter, and reservoir once weekly with soapy water.  Let dry before use Follow up with [facility] MD  In 90  days after you start to consistently wear your CPAP therapy every night  Send CPAP machine to appointment with patient to allow for down Load of data ( will need to get from SimCard)>> Facility does not have access to AirView  COPD (chronic obstructive pulmonary disease) (HCC) Flare of COPD with worsening allergies>> improved Plan: We will call the Nursing home to ensure they have provided you with the following medications Continue  Advair Discus BID without fail as soon as available through your Nursing home Continue Spiriva 1 puff once daily  Rinse mouth after use Resume  Duonebs BID scheduled without fail as soon as your facility allows nebs. Rinse mouth after use We will add Albuterol 1-2 puffs up to 4 times daily for shortness of breath or wheezing Start Claritin 10 mg daily for allergies Follow up after you have been placed on CPAP every night for 30 nights so we can ensure you are benefiting from treatment.  Please contact office for sooner follow up if symptoms do not improve or worsen or seek emergency care  We need to transition patient to [facility] for further care as she now resides in [facility] and her LTAC is having to send her 45 minutes for OV. ( Difficult for the patient as she is on lasix therapy) Please contact office for sooner follow up if symptoms do  not improve or worsen or seek emergency care   This appointment was 45  min long with over 50% of the time in direct face-to-face patient care, assessment, plan of care, and follow-up with nursing facility to ensure therapies are being administered.Bevelyn Ngo, NP 03/27/2019  3:16 PM

## 2019-03-27 NOTE — Patient Instructions (Addendum)
It is good to see you today For your Sleep Apnea/CPAP Therapy We will call the nursing home to make sure they understand that the patient need her CPAP set up every night without fail. Maintain Current settings of  CPAP 17 cm H2O with heated humidity and mask of choice.  Ensure  1 liter oxygen is added at night with CPAP.  Continue on CPAP at bedtime every night without fail. Goal is to wear for at least 6 hours each night for maximal clinical benefit. Continue to work on weight loss, as the link between excess weight  and sleep apnea is well established.  Remember to establish a good bedtime routine, and work on sleep hygiene. Limit daytime naps , avoid stimulants such as caffeine and nicotine close to bedtime, exercise daily to promote sleep quality, avoid heavy spicy, fried , or rich foods before bed. Ensure adequate exposure to natural light during the day,establish a relaxing bedtime routine with a pleasant sleep environment ( Bedroom between 60 and 67 degrees, turn off bright lights , TV or device screens screens , consider black out curtains or white noise machines) Do not drive if sleepy. Remember to clean mask, tubing, filter, and reservoir once weekly with soapy water.  Let dry before use Follow up with [facility] 90  days after you wear your CPAP every night We will need to make sure we can get Down Loads of you CPAP use to ensure you are receiving maximal benefit. Pt will need to bring CPAP to visit>> No access to AirView through facility  For your COPD We will call the Nursing home to ensure they have provided you with the following medications Continue  Advair Discus BID without fail Continue Spiriva 1 puff once daily Rinse mouth after use Resume  Duonebs BID scheduled without fail as soon as your facility allows nebs. Rinse mouth after use We will add Albuterol 1-2 puffs up to 4 times daily for shortness of breath or wheezing Start Claritin 10 mg daily for  allergies Start Prednisone 20 mg x 3 days, then 10 mg x 3  days then stop Monitor blood sugars while on prednisone Please contact office for sooner follow up if symptoms do not improve or worsen or seek emergency care

## 2019-05-03 ENCOUNTER — Ambulatory Visit: Payer: Self-pay | Admitting: Urology

## 2019-06-14 ENCOUNTER — Encounter: Payer: Self-pay | Admitting: Urology

## 2019-06-14 ENCOUNTER — Ambulatory Visit (INDEPENDENT_AMBULATORY_CARE_PROVIDER_SITE_OTHER): Payer: Medicare Other | Admitting: Urology

## 2019-06-14 ENCOUNTER — Other Ambulatory Visit: Payer: Self-pay

## 2019-06-14 VITALS — BP 120/73 | HR 91

## 2019-06-14 DIAGNOSIS — N3941 Urge incontinence: Secondary | ICD-10-CM

## 2019-06-14 MED ORDER — SOLIFENACIN SUCCINATE 5 MG PO TABS
5.0000 mg | ORAL_TABLET | Freq: Every day | ORAL | 3 refills | Status: AC
Start: 2019-06-14 — End: 2020-06-13

## 2019-06-14 NOTE — Patient Instructions (Signed)

## 2019-06-14 NOTE — Progress Notes (Signed)
06/14/2019 3:10 PM   LISSIE HINESLEY 19-Jan-1951 [id number]  Referring provider: Lavera Guise, [place],  [place] [address]  No chief complaint on file.   HPI:  Hidaya was referred over for urgency and urge incontinence. She has foot on the floor and might lose her whole bladder. She has some frequency. She is ambulatory. No gross hematuria. She drinks water and cranberry. She voids with a good flow. No constipation. No NG risk. No prolapse symptoms.   Modifying factors: There are no other modifying factors  Associated signs and symptoms: There are no other associated signs and symptoms Aggravating and relieving factors: There are no other aggravating or relieving factors Severity: Moderate Duration: Persistent     PMH: Past Medical History:  Diagnosis Date  . Arthritis   . Asthma   . Brain aneurysm   . CHF (congestive heart failure) ([place])   . COPD (chronic obstructive pulmonary disease) ([place])   . Diabetes mellitus without complication ([place])   . Hypertension   . Stroke [facility]) 2003    Surgical History: Past Surgical History:  Procedure Laterality Date  . CEREBRAL ANEURYSM REPAIR  2013    Home Medications:  Allergies as of 06/14/2019      Reactions   Amoxicillin-pot Clavulanate Diarrhea   Has patient had a PCN reaction causing immediate rash, facial/tongue/throat swelling, SOB or lightheadedness with hypotension: No Has patient had a PCN reaction causing severe rash involving mucus membranes or skin necrosis: No Has patient had a PCN reaction that required hospitalization: No Has patient had a PCN reaction occurring within the last 10 years: No If all of the above answers are "NO", then may proceed with Cephalosporin use.   Neosporin [neomycin-bacitracin Zn-polymyx] Hives      Medication List       Accurate as of June 14, 2019  3:10 PM. If you have any questions, ask your nurse or doctor.        atorvastatin 20 MG tablet Commonly known as:  LIPITOR Take 1 tablet (20 mg total) by mouth daily.   clopidogrel 75 MG tablet Commonly known as: PLAVIX Take 1 tablet (75 mg total) by mouth daily.   clotrimazole 1 % cream Commonly known as: LOTRIMIN Apply to affected area 2 times daily   diclofenac sodium 1 % Gel Commonly known as: VOLTAREN Apply topically 4 (four) times daily.   fluticasone 50 MCG/ACT nasal spray Commonly known as: FLONASE Place 2 sprays into both nostrils daily.   Fluticasone-Salmeterol 250-50 MCG/DOSE Aepb Commonly known as: ADVAIR Inhale 1 puff into the lungs 2 (two) times daily.   furosemide 40 MG tablet Commonly known as: LASIX Take 1 tablet (40 mg total) by mouth 2 (two) times daily. What changed: when to take this   gabapentin 100 MG capsule Commonly known as: NEURONTIN Take 600 mg by mouth 2 (two) times daily.   lidocaine 5 % Commonly known as: LIDODERM Place 2 patches onto the skin daily. Remove & Discard patch within 12 hours or as directed by MD   lisinopril 2.5 MG tablet Commonly known as: ZESTRIL Take 1 tablet (2.5 mg total) by mouth daily.   metFORMIN 500 MG tablet Commonly known as: GLUCOPHAGE Take 500 mg by mouth daily with breakfast.   pantoprazole 20 MG tablet Commonly known as: PROTONIX Take 20 mg by mouth daily.   potassium chloride 10 MEQ tablet Commonly known as: K-DUR TAKE 2 TABLETS BY MOUTH EVERY DAY   tiotropium 18 MCG inhalation  capsule Commonly known as: Spiriva HandiHaler Use once day   traMADol 50 MG tablet Commonly known as: ULTRAM TAKE 1 TABLET BY MOUTH TWICE A DAY FOR KNEE PAIN AS NEEDED What changed: See the new instructions.   Tylenol 8 Hour Arthritis Pain 650 MG CR tablet Generic drug: acetaminophen Take 1,300 mg by mouth every 8 (eight) hours as needed for pain.       Allergies:  Allergies  Allergen Reactions  . Amoxicillin-Pot Clavulanate Diarrhea    Has patient had a PCN reaction causing immediate rash, facial/tongue/throat swelling, SOB  or lightheadedness with hypotension: No Has patient had a PCN reaction causing severe rash involving mucus membranes or skin necrosis: No Has patient had a PCN reaction that required hospitalization: No Has patient had a PCN reaction occurring within the last 10 years: No If all of the above answers are "NO", then may proceed with Cephalosporin use.   . Neosporin [Neomycin-Bacitracin Zn-Polymyx] Hives    Family History: Family History  Problem Relation Age of Onset  . Diabetes Mellitus II Daughter     Social History:  reports that she quit smoking about 15 months ago. Her smoking use included cigarettes. She has a 50.00 pack-year smoking history. She has never used smokeless tobacco. She reports that she does not drink alcohol or use drugs.  ROS:                                        Physical Exam: There were no vitals taken for this visit.  Constitutional:  Alert and oriented, No acute distress. In a wheelchair  HEENT: [place] AT, moist mucus membranes.  Trachea midline, no masses. Cardiovascular: No clubbing, cyanosis, or edema. Respiratory: Normal respiratory effort, no increased work of breathing. GI: Abdomen is soft, nontender, nondistended, no abdominal masses GU: No CVA tenderness Skin: No rashes, bruises or suspicious lesions. Neurologic: Grossly intact, no focal deficits, moving all 4 extremities. Psychiatric: Normal mood and affect.  Laboratory Data: Lab Results  Component Value Date   WBC 5.1 03/19/2018   HGB 13.9 03/19/2018   HCT 45.0 03/19/2018   MCV 96.4 03/19/2018   PLT 201 03/19/2018    Lab Results  Component Value Date   CREATININE 0.68 03/21/2018    No results found for: PSA  No results found for: TESTOSTERONE  Lab Results  Component Value Date   HGBA1C 5.6 02/23/2018    Urinalysis    Component Value Date/Time   COLORURINE straw 02/07/2009 0000   APPEARANCEUR Hazy 02/07/2009 0000   LABSPEC 1.020 02/07/2009 0000    PHURINE 5.0 02/07/2009 0000   HGBUR large 02/07/2009 0000   BILIRUBINUR negative 02/07/2009 0000   UROBILINOGEN negative 02/07/2009 0000   NITRITE negative 02/07/2009 0000    No results found for: LABMICR, WBCUA, RBCUA, LABEPIT, MUCUS, BACTERIA  Pertinent Imaging: n/a No results found for this or any previous visit. No results found for this or any previous visit. No results found for this or any previous visit. No results found for this or any previous visit. No results found for this or any previous visit. No results found for this or any previous visit. No results found for this or any previous visit. No results found for this or any previous visit.  Assessment & Plan:    Urgency, UUI - discussed timed voiding and trial of anticholinergic and she elects to proceed. Discussed the nature r/b/a  to anticholinergics.  No follow-ups on file.  Jerilee FieldMatthew Chino Sardo, MD  [facility] [address], [address] [place], [place] [address] [phone]

## 2019-06-27 NOTE — Progress Notes (Signed)
Reviewed and agree with assessment/plan.   Jamison Yuhasz, MD [facility] 11/11/2016, 12:24 PM Pager:  [phone]  

## 2019-06-27 NOTE — Progress Notes (Signed)
Reviewed and agree with assessment/plan.   Dennise Raabe, MD [facility] 11/11/2016, 12:24 PM Pager:  [phone]  

## 2019-07-26 ENCOUNTER — Ambulatory Visit (INDEPENDENT_AMBULATORY_CARE_PROVIDER_SITE_OTHER): Payer: Medicare Other | Admitting: Urology

## 2019-07-26 ENCOUNTER — Encounter: Payer: Self-pay | Admitting: Urology

## 2019-07-26 ENCOUNTER — Other Ambulatory Visit: Payer: Self-pay

## 2019-07-26 VITALS — BP 105/65 | HR 91 | Ht 66.0 in | Wt 292.0 lb

## 2019-07-26 DIAGNOSIS — N3941 Urge incontinence: Secondary | ICD-10-CM | POA: Diagnosis not present

## 2019-07-26 NOTE — Progress Notes (Signed)
07/26/2019 2:07 PM   Karen PolingLinda J Perez 09/27/1951 [id number]  Referring provider: Lyndon Perez, Fozia M, MD [address] [place] [id number]  Chief Complaint  Patient presents with  . Urinary Incontinence    6wk w/PVR    HPI:  F/u -   Urgency and urge incontinence. She has foot on the floor and might lose her whole bladder. She has some frequency. She is ambulatory. No gross hematuria. She drinks water and cranberry. She voids with a good flow. No constipation. No NG risk. No prolapse symptoms.   She started solifenacin 5 mg July 2020. PVR is 95 ml today. She has a pullup. She has bothersome urgency and can't make it to the toilet in time. She has limited mobility. She has dry mouth and constipation.   PMH: Past Medical History:  Diagnosis Date  . Arthritis   . Asthma   . Brain aneurysm   . CHF (congestive heart failure) ([facility])   . COPD (chronic obstructive pulmonary disease) ([facility])   . Diabetes mellitus without complication ([facility])   . Hypertension   . Stroke [facility]) 2003    Surgical History: Past Surgical History:  Procedure Laterality Date  . CEREBRAL ANEURYSM REPAIR  2013    Home Medications:  Allergies as of 07/26/2019      Reactions   Amoxicillin    Amoxicillin-pot Clavulanate Diarrhea   Has patient had a PCN reaction causing immediate rash, facial/tongue/throat swelling, SOB or lightheadedness with hypotension: No Has patient had a PCN reaction causing severe rash involving mucus membranes or skin necrosis: No Has patient had a PCN reaction that required hospitalization: No Has patient had a PCN reaction occurring within the last 10 years: No If all of the above answers are "NO", then may proceed with Cephalosporin use.   Bacitracin    Neosporin [neomycin-bacitracin Zn-polymyx] Hives      Medication List       Accurate as of July 26, 2019  2:07 PM. If you have any questions, ask your nurse or doctor.        albuterol 108 (90 Base) MCG/ACT inhaler  Commonly known as: VENTOLIN HFA   atorvastatin 20 MG tablet Commonly known as: LIPITOR Take 1 tablet (20 mg total) by mouth daily.   clopidogrel 75 MG tablet Commonly known as: PLAVIX Take 1 tablet (75 mg total) by mouth daily.   clotrimazole 1 % cream Commonly known as: LOTRIMIN Apply to affected area 2 times daily   clotrimazole-betamethasone cream Commonly known as: LOTRISONE   cyclobenzaprine 5 MG tablet Commonly known as: FLEXERIL   diclofenac sodium 1 % Gel Commonly known as: VOLTAREN Apply topically 4 (four) times daily.   docusate sodium 100 MG capsule Commonly known as: COLACE Take by mouth.   fluticasone 50 MCG/ACT nasal spray Commonly known as: FLONASE Place 2 sprays into both nostrils daily.   Fluticasone-Salmeterol 250-50 MCG/DOSE Aepb Commonly known as: ADVAIR Inhale 1 puff into the lungs 2 (two) times daily.   furosemide 40 MG tablet Commonly known as: LASIX Take 1 tablet (40 mg total) by mouth 2 (two) times daily. What changed: when to take this   gabapentin 100 MG capsule Commonly known as: NEURONTIN Take 600 mg by mouth 2 (two) times daily.   lidocaine 5 % Commonly known as: LIDODERM Place 2 patches onto the skin daily. Remove & Discard patch within 12 hours or as directed by MD   lisinopril 2.5 MG tablet Commonly known as: ZESTRIL Take 1 tablet (2.5  mg total) by mouth daily.   metFORMIN 500 MG tablet Commonly known as: GLUCOPHAGE Take 500 mg by mouth daily with breakfast.   Nyamyc powder Generic drug: nystatin   pantoprazole 20 MG tablet Commonly known as: PROTONIX Take 20 mg by mouth daily.   potassium chloride 10 MEQ tablet Commonly known as: K-DUR TAKE 2 TABLETS BY MOUTH EVERY DAY   potassium chloride SA 20 MEQ tablet Commonly known as: K-DUR   solifenacin 5 MG tablet Commonly known as: VESICARE Take 1 tablet (5 mg total) by mouth daily.   tiotropium 18 MCG inhalation capsule Commonly known as: Spiriva HandiHaler Use  once day   traMADol 50 MG tablet Commonly known as: ULTRAM TAKE 1 TABLET BY MOUTH TWICE A DAY FOR KNEE PAIN AS NEEDED What changed: See the new instructions.   traZODone 50 MG tablet Commonly known as: DESYREL   Tylenol 8 Hour Arthritis Pain 650 MG CR tablet Generic drug: acetaminophen Take 1,300 mg by mouth every 8 (eight) hours as needed for pain.       Allergies:  Allergies  Allergen Reactions  . Amoxicillin   . Amoxicillin-Pot Clavulanate Diarrhea    Has patient had a PCN reaction causing immediate rash, facial/tongue/throat swelling, SOB or lightheadedness with hypotension: No Has patient had a PCN reaction causing severe rash involving mucus membranes or skin necrosis: No Has patient had a PCN reaction that required hospitalization: No Has patient had a PCN reaction occurring within the last 10 years: No If all of the above answers are "NO", then may proceed with Cephalosporin use.   . Bacitracin   . Neosporin [Neomycin-Bacitracin Zn-Polymyx] Hives    Family History: Family History  Problem Relation Age of Onset  . Diabetes Mellitus II Daughter     Social History:  reports that she quit smoking about 17 months ago. Her smoking use included cigarettes. She has a 50.00 pack-year smoking history. She has never used smokeless tobacco. She reports that she does not drink alcohol or use drugs.  ROS:                                        Physical Exam: There were no vitals taken for this visit.  Constitutional:  Alert and oriented, No acute distress. HEENT: [place] AT, moist mucus membranes.  Trachea midline, no masses. Cardiovascular: No clubbing, cyanosis, or edema. Respiratory: Normal respiratory effort, no increased work of breathing. GI: Abdomen is soft, nontender, nondistended, no abdominal masses Skin: No rashes, bruises or suspicious lesions. Neurologic: Grossly intact, no focal deficits, moving all 4 extremities. Psychiatric: Normal mood  and affect.  Laboratory Data: Lab Results  Component Value Date   WBC 5.1 03/19/2018   HGB 13.9 03/19/2018   HCT 45.0 03/19/2018   MCV 96.4 03/19/2018   PLT 201 03/19/2018    Lab Results  Component Value Date   CREATININE 0.68 03/21/2018    No results found for: PSA  No results found for: TESTOSTERONE  Lab Results  Component Value Date   HGBA1C 5.6 02/23/2018    Urinalysis    Component Value Date/Time   COLORURINE straw 02/07/2009 0000   APPEARANCEUR Hazy 02/07/2009 0000   LABSPEC 1.020 02/07/2009 0000   PHURINE 5.0 02/07/2009 0000   HGBUR large 02/07/2009 0000   BILIRUBINUR negative 02/07/2009 0000   UROBILINOGEN negative 02/07/2009 0000   NITRITE negative 02/07/2009 0000    No  results found for: LABMICR, [place], RBCUA, LABEPIT, MUCUS, BACTERIA  Pertinent Imaging: n/a No results found for this or any previous visit. No results found for this or any previous visit. No results found for this or any previous visit. No results found for this or any previous visit. No results found for this or any previous visit. No results found for this or any previous visit. No results found for this or any previous visit. No results found for this or any previous visit.  Assessment & Plan:    1. Urgency incontinence I'm going to send three weeks of Myrbetriq 25 mg with her to take over the next three weeks (add to solifenacin). She will let me know if that improves her symptoms. We could also consider PTNS.  - BLADDER SCAN AMB NON-IMAGING   No follow-ups on file.  Festus Aloe, MD  [facility] [address] [place], [place] [phone]

## 2019-07-26 NOTE — Patient Instructions (Signed)

## 2019-07-28 ENCOUNTER — Encounter: Payer: Self-pay | Admitting: Pulmonary Disease

## 2019-07-28 ENCOUNTER — Ambulatory Visit (INDEPENDENT_AMBULATORY_CARE_PROVIDER_SITE_OTHER): Payer: Medicare Other | Admitting: Pulmonary Disease

## 2019-07-28 ENCOUNTER — Other Ambulatory Visit: Payer: Self-pay

## 2019-07-28 DIAGNOSIS — J449 Chronic obstructive pulmonary disease, unspecified: Secondary | ICD-10-CM

## 2019-07-28 DIAGNOSIS — J9611 Chronic respiratory failure with hypoxia: Secondary | ICD-10-CM | POA: Diagnosis not present

## 2019-07-28 DIAGNOSIS — G4733 Obstructive sleep apnea (adult) (pediatric): Secondary | ICD-10-CM

## 2019-07-28 DIAGNOSIS — E662 Morbid (severe) obesity with alveolar hypoventilation: Secondary | ICD-10-CM

## 2019-07-28 NOTE — Progress Notes (Signed)
[facility]  Chief Complaint  Patient presents with  . COPD    follow up  . Sleep Apnea    Constitutional:  There were no vitals taken for this visit.  Deferred.  Past Medical History:  CVA, HTN, DM, Diastolic CHF, Brain aneurysm, OA  Brief Summary:  Karen Perez is a 68 y.o. female former smoker with COPD, sleep apnea, VCD.  Virtual Visit via Telephone Note  I connected with Karen Perez on 07/28/19 at 11:00 AM EDT by telephone and verified that I am speaking with the correct person using two identifiers.  Location: Patient: nursing home Provider: medical office   I discussed the limitations, risks, security and privacy concerns of performing an evaluation and management service by telephone and the availability of in person appointments. I also discussed with the patient that there may be a patient responsible charge related to this service. The patient expressed understanding and agreed to proceed.  She has trouble using CPAP at night.  Doesn't feel like it helps her sleep.  Using 1 liter oxygen at night.  Feels her sleep is okay.  Has occasional cough with clear sputum.  Not having chest pain or wheeze.  Gets hoarse in her throat sometimes.  Uses albuterol intermittently.    Physical Exam:   Deferred.  Assessment/Plan:   COPD with chronic bronchitis and emphysema. - continue spiriva, advair and prn ventolin  Obstructive sleep apnea. - reviewed importance of compliance with CPAP and explained how untreated sleep apnea can impact her health - she will try to resume CPAP 17 cm H2O  Chronic respiratory failure with nocturnal hypoxemia. - from COPD and obesity hypoventilation syndrome. - continue 1 liter oxygen at night with CPAP   Patient Instructions  Follow up in 6 months   I discussed the assessment and treatment plan with the patient. The patient was provided an opportunity to ask questions and all were  answered. The patient agreed with the plan and demonstrated an understanding of the instructions.   The patient was advised to call back or seek an in-person evaluation if the symptoms worsen or if the condition fails to improve as anticipated.  I provided 18 minutes of non-face-to-face time during this encounter.  Coralyn HellingVineet Deja Kaigler, MD [facility] Pager: [phone] 07/28/2019, 11:31 AM  Flow Sheet     Pulmonary tests:  PFT 08/01/09 >> FEV1 1.21 (51%), FEV1% 70, TLC 3.48 (68%), DLCO 42%  Chest imaging:  CT angio chest 02/23/18 >> centrilobular emphysema  Sleep tests:  PSG 04/14/04 > RDI 20 HST 05/12/18 >> AHI 13.3, SaO2 low 65%.  Spent 223 min with SaO2 < 90%. CPAP 08/04/18 >> CPAP 17 cm H2O with 1 liter O2 >> AHI 11  Cardiac tests:  Echo 02/23/18 >> EF 65 to 70%, grade 1 DD  Medications:   Allergies as of 07/28/2019      Reactions   Amoxicillin    Amoxicillin-pot Clavulanate Diarrhea   Has patient had a PCN reaction causing immediate rash, facial/tongue/throat swelling, SOB or lightheadedness with hypotension: No Has patient had a PCN reaction causing severe rash involving mucus membranes or skin necrosis: No Has patient had a PCN reaction that required hospitalization: No Has patient had a PCN reaction occurring within the last 10 years: No If all of the above answers are "NO", then may proceed with Cephalosporin use.   Bacitracin    Neosporin [neomycin-bacitracin Zn-polymyx] Hives      Medication List  Accurate as of July 28, 2019 11:31 AM. If you have any questions, ask your nurse or doctor.        albuterol 108 (90 Base) MCG/ACT inhaler Commonly known as: VENTOLIN HFA   atorvastatin 20 MG tablet Commonly known as: LIPITOR Take 1 tablet (20 mg total) by mouth daily.   clopidogrel 75 MG tablet Commonly known as: PLAVIX Take 1 tablet (75 mg total) by mouth daily.   clotrimazole 1 % cream Commonly known as: LOTRIMIN Apply to affected  area 2 times daily   clotrimazole-betamethasone cream Commonly known as: LOTRISONE   cyclobenzaprine 5 MG tablet Commonly known as: FLEXERIL   diclofenac sodium 1 % Gel Commonly known as: VOLTAREN Apply topically 4 (four) times daily.   docusate sodium 100 MG capsule Commonly known as: COLACE Take by mouth.   fluticasone 50 MCG/ACT nasal spray Commonly known as: FLONASE Place 2 sprays into both nostrils daily.   Fluticasone-Salmeterol 250-50 MCG/DOSE Aepb Commonly known as: ADVAIR Inhale 1 puff into the lungs 2 (two) times daily.   furosemide 40 MG tablet Commonly known as: LASIX Take 1 tablet (40 mg total) by mouth 2 (two) times daily. What changed: when to take this   gabapentin 100 MG capsule Commonly known as: NEURONTIN Take 600 mg by mouth 2 (two) times daily.   ketoconazole 2 % cream Commonly known as: NIZORAL   lidocaine 5 % Commonly known as: LIDODERM Place 2 patches onto the skin daily. Remove & Discard patch within 12 hours or as directed by MD   lisinopril 2.5 MG tablet Commonly known as: ZESTRIL Take 1 tablet (2.5 mg total) by mouth daily.   metFORMIN 500 MG tablet Commonly known as: GLUCOPHAGE Take 500 mg by mouth daily with breakfast.   Nyamyc powder Generic drug: nystatin   pantoprazole 20 MG tablet Commonly known as: PROTONIX Take 20 mg by mouth daily.   potassium chloride 10 MEQ tablet Commonly known as: K-DUR TAKE 2 TABLETS BY MOUTH EVERY DAY   potassium chloride SA 20 MEQ tablet Commonly known as: K-DUR   Restasis 0.05 % ophthalmic emulsion Generic drug: cycloSPORINE   solifenacin 5 MG tablet Commonly known as: VESICARE Take 1 tablet (5 mg total) by mouth daily.   tiotropium 18 MCG inhalation capsule Commonly known as: Spiriva HandiHaler Use once day   traMADol 50 MG tablet Commonly known as: ULTRAM TAKE 1 TABLET BY MOUTH TWICE A DAY FOR KNEE PAIN AS NEEDED What changed: See the new instructions.   traZODone 50 MG tablet  Commonly known as: DESYREL   Tylenol 8 Hour Arthritis Pain 650 MG CR tablet Generic drug: acetaminophen Take 1,300 mg by mouth every 8 (eight) hours as needed for pain.       Past Surgical History:  She  has a past surgical history that includes Cerebral aneurysm repair (2013).  Family History:  Her family history includes Diabetes Mellitus II in her daughter.  Social History:  She  reports that she quit smoking about 17 months ago. Her smoking use included cigarettes. She has a 50.00 pack-year smoking history. She has never used smokeless tobacco. She reports that she does not drink alcohol or use drugs.

## 2019-07-28 NOTE — Patient Instructions (Signed)
Follow up in 6 months 

## 2019-09-04 ENCOUNTER — Ambulatory Visit: Payer: Medicare Other | Admitting: Family

## 2019-09-07 IMAGING — DX DG CHEST 1V PORT
1 series · 1 of 1 positions shown · non-contrast
Comparison: Chest x-ray from yesterday.

CLINICAL DATA: Shortness of breath and wheezing.

EXAM:
PORTABLE CHEST 1 VIEW

[chest ap]
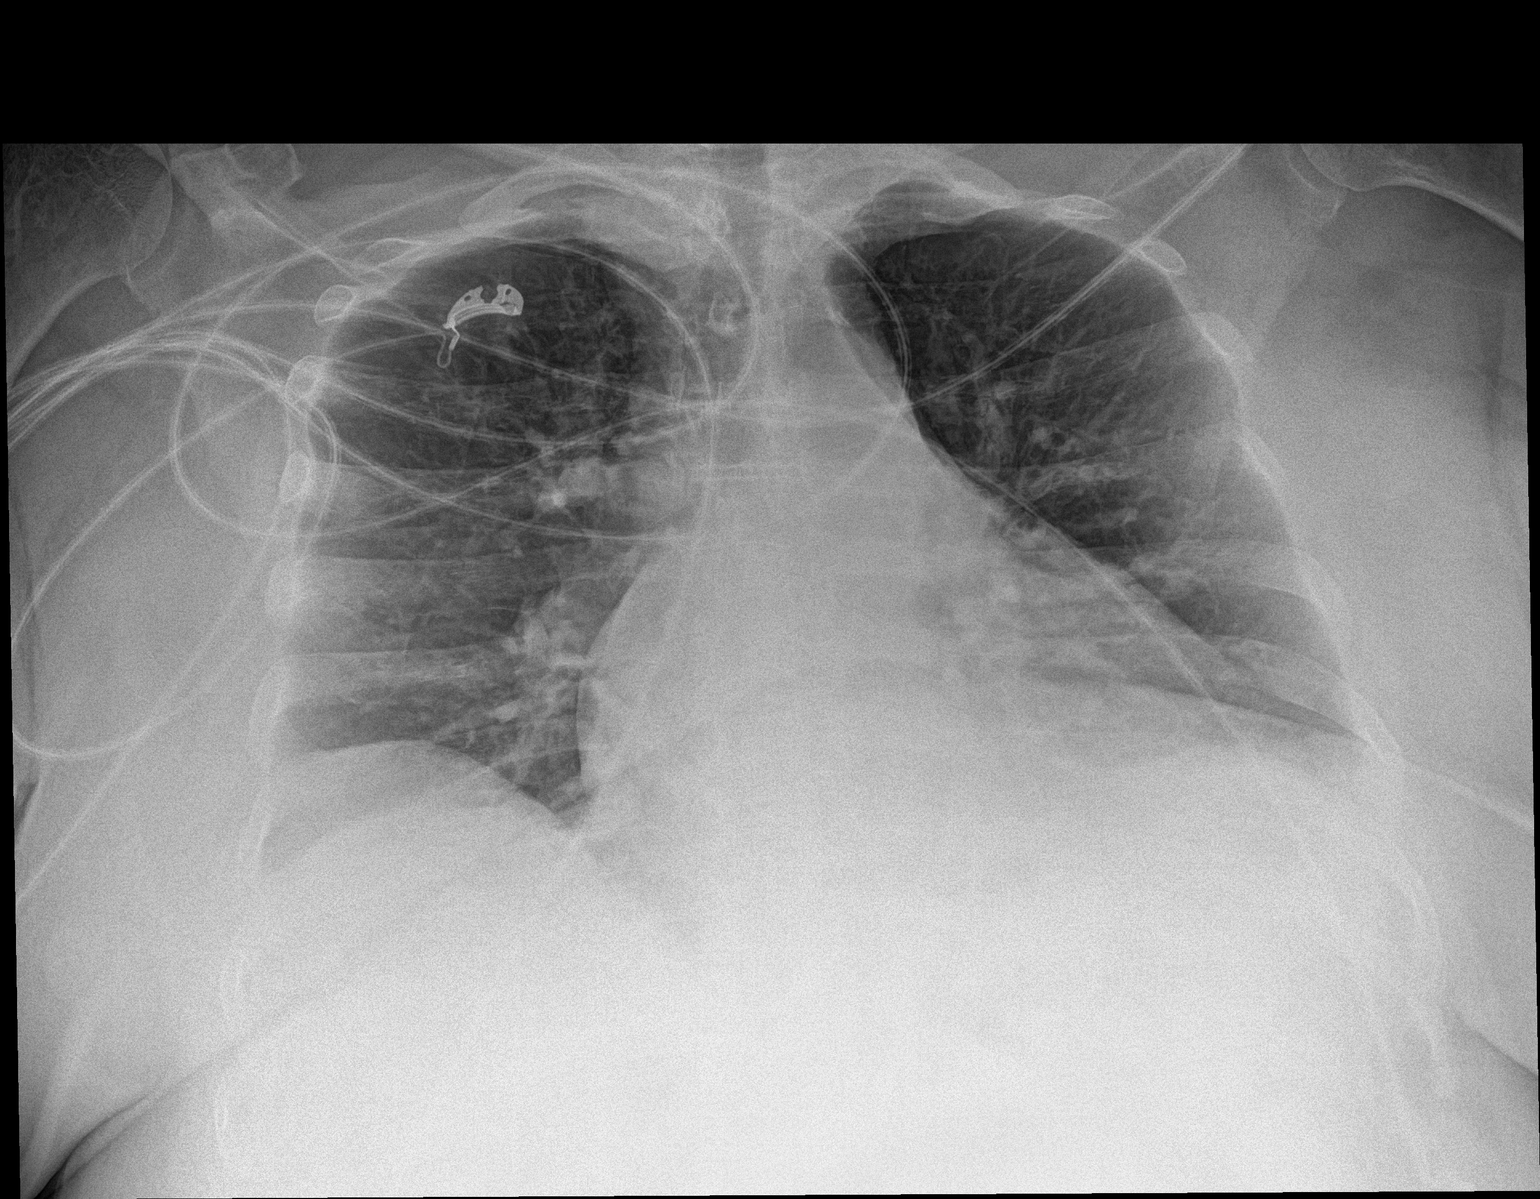

[1 of 1 positions shown; findings below may reference images not displayed]

FINDINGS: Stable cardiomediastinal silhouette. Normal pulmonary vascularity.
Persistent low lung volumes with slightly improved aeration the left
lung base. No focal consolidation, pleural effusion, or
pneumothorax. No acute osseous abnormality.
IMPRESSION: Slightly improved aeration in the left lower lobe.

## 2019-09-09 ENCOUNTER — Inpatient Hospital Stay (HOSPITAL_COMMUNITY)
Admission: AD | Admit: 2019-09-09 | Discharge: 2019-09-14 | DRG: 177 | Disposition: A | Discharge status: Skilled Nursing Facility | Payer: Medicare Other | Source: Other Acute Inpatient Hospital | Attending: Internal Medicine | Admitting: Internal Medicine

## 2019-09-09 ENCOUNTER — Emergency Department
Admission: EM | Admit: 2019-09-09 | Discharge: 2019-09-09 | Disposition: A | Discharge status: Other Acute Inpatient Hospital | Payer: Medicare Other | Attending: Emergency Medicine | Admitting: Emergency Medicine

## 2019-09-09 ENCOUNTER — Emergency Department: Payer: Medicare Other

## 2019-09-09 ENCOUNTER — Other Ambulatory Visit: Payer: Self-pay

## 2019-09-09 DIAGNOSIS — Z7902 Long term (current) use of antithrombotics/antiplatelets: Secondary | ICD-10-CM | POA: Diagnosis not present

## 2019-09-09 DIAGNOSIS — U071 COVID-19: Secondary | ICD-10-CM | POA: Diagnosis present

## 2019-09-09 DIAGNOSIS — Z79899 Other long term (current) drug therapy: Secondary | ICD-10-CM | POA: Diagnosis not present

## 2019-09-09 DIAGNOSIS — G4733 Obstructive sleep apnea (adult) (pediatric): Secondary | ICD-10-CM | POA: Diagnosis present

## 2019-09-09 DIAGNOSIS — B9620 Unspecified Escherichia coli [E. coli] as the cause of diseases classified elsewhere: Secondary | ICD-10-CM | POA: Diagnosis present

## 2019-09-09 DIAGNOSIS — E1169 Type 2 diabetes mellitus with other specified complication: Secondary | ICD-10-CM | POA: Diagnosis present

## 2019-09-09 DIAGNOSIS — Z87891 Personal history of nicotine dependence: Secondary | ICD-10-CM | POA: Insufficient documentation

## 2019-09-09 DIAGNOSIS — I110 Hypertensive heart disease with heart failure: Secondary | ICD-10-CM | POA: Insufficient documentation

## 2019-09-09 DIAGNOSIS — J449 Chronic obstructive pulmonary disease, unspecified: Secondary | ICD-10-CM | POA: Insufficient documentation

## 2019-09-09 DIAGNOSIS — I10 Essential (primary) hypertension: Secondary | ICD-10-CM | POA: Diagnosis not present

## 2019-09-09 DIAGNOSIS — I5032 Chronic diastolic (congestive) heart failure: Secondary | ICD-10-CM | POA: Diagnosis not present

## 2019-09-09 DIAGNOSIS — Z23 Encounter for immunization: Secondary | ICD-10-CM

## 2019-09-09 DIAGNOSIS — E785 Hyperlipidemia, unspecified: Secondary | ICD-10-CM | POA: Diagnosis present

## 2019-09-09 DIAGNOSIS — J440 Chronic obstructive pulmonary disease with acute lower respiratory infection: Secondary | ICD-10-CM | POA: Diagnosis present

## 2019-09-09 DIAGNOSIS — Z7951 Long term (current) use of inhaled steroids: Secondary | ICD-10-CM | POA: Diagnosis not present

## 2019-09-09 DIAGNOSIS — E119 Type 2 diabetes mellitus without complications: Secondary | ICD-10-CM | POA: Diagnosis present

## 2019-09-09 DIAGNOSIS — Z888 Allergy status to other drugs, medicaments and biological substances status: Secondary | ICD-10-CM

## 2019-09-09 DIAGNOSIS — R0902 Hypoxemia: Secondary | ICD-10-CM | POA: Insufficient documentation

## 2019-09-09 DIAGNOSIS — Z8673 Personal history of transient ischemic attack (TIA), and cerebral infarction without residual deficits: Secondary | ICD-10-CM

## 2019-09-09 DIAGNOSIS — Z6841 Body Mass Index (BMI) 40.0 and over, adult: Secondary | ICD-10-CM

## 2019-09-09 DIAGNOSIS — E669 Obesity, unspecified: Secondary | ICD-10-CM

## 2019-09-09 DIAGNOSIS — E079 Disorder of thyroid, unspecified: Secondary | ICD-10-CM | POA: Diagnosis present

## 2019-09-09 DIAGNOSIS — J1289 Other viral pneumonia: Secondary | ICD-10-CM | POA: Diagnosis present

## 2019-09-09 DIAGNOSIS — N390 Urinary tract infection, site not specified: Secondary | ICD-10-CM | POA: Diagnosis present

## 2019-09-09 DIAGNOSIS — R0602 Shortness of breath: Secondary | ICD-10-CM | POA: Diagnosis present

## 2019-09-09 DIAGNOSIS — R509 Fever, unspecified: Secondary | ICD-10-CM | POA: Diagnosis not present

## 2019-09-09 DIAGNOSIS — R0603 Acute respiratory distress: Secondary | ICD-10-CM

## 2019-09-09 DIAGNOSIS — J9601 Acute respiratory failure with hypoxia: Secondary | ICD-10-CM | POA: Diagnosis present

## 2019-09-09 LAB — URINALYSIS, COMPLETE (UACMP) WITH MICROSCOPIC
Bilirubin Urine: NEGATIVE
Glucose, UA: NEGATIVE mg/dL
Hgb urine dipstick: NEGATIVE
Ketones, ur: NEGATIVE mg/dL
Leukocytes,Ua: NEGATIVE
Nitrite: NEGATIVE
Protein, ur: NEGATIVE mg/dL
Specific Gravity, Urine: 1.017 (ref 1.005–1.030)
pH: 6 (ref 5.0–8.0)

## 2019-09-09 LAB — COMPREHENSIVE METABOLIC PANEL
ALT: 14 U/L (ref 0–44)
AST: 16 U/L (ref 15–41)
Albumin: 3.4 g/dL — ABNORMAL LOW (ref 3.5–5.0)
Alkaline Phosphatase: 53 U/L (ref 38–126)
Anion gap: 8 (ref 5–15)
BUN: 10 mg/dL (ref 8–23)
CO2: 38 mmol/L — ABNORMAL HIGH (ref 22–32)
Calcium: 8.6 mg/dL — ABNORMAL LOW (ref 8.9–10.3)
Chloride: 94 mmol/L — ABNORMAL LOW (ref 98–111)
Creatinine, Ser: 0.74 mg/dL (ref 0.44–1.00)
GFR calc Af Amer: >60 mL/min (ref >60)
GFR calc non Af Amer: >60 mL/min (ref >60)
Glucose, Bld: 105 mg/dL — ABNORMAL HIGH (ref 70–99)
Potassium: 4.1 mmol/L (ref 3.5–5.1)
Sodium: 140 mmol/L (ref 135–145)
Total Bilirubin: 0.3 mg/dL (ref 0.3–1.2)
Total Protein: 7 g/dL (ref 6.5–8.1)

## 2019-09-09 LAB — CBC WITH DIFFERENTIAL/PLATELET
Abs Immature Granulocytes: 0.01 10*3/uL (ref 0.00–0.07)
Basophils Absolute: 0 10*3/uL (ref 0.0–0.1)
Basophils Relative: 0 %
Eosinophils Absolute: 0 10*3/uL (ref 0.0–0.5)
Eosinophils Relative: 0 %
HCT: 42.7 % (ref 36.0–46.0)
Hemoglobin: 12.1 g/dL (ref 12.0–15.0)
Immature Granulocytes: 0 %
Lymphocytes Relative: 11 %
Lymphs Abs: 0.6 10*3/uL — ABNORMAL LOW (ref 0.7–4.0)
MCH: 27.7 pg (ref 26.0–34.0)
MCHC: 28.3 g/dL — ABNORMAL LOW (ref 30.0–36.0)
MCV: 97.7 fL (ref 80.0–100.0)
Monocytes Absolute: 0.7 10*3/uL (ref 0.1–1.0)
Monocytes Relative: 13 %
Neutro Abs: 4.1 10*3/uL (ref 1.7–7.7)
Neutrophils Relative %: 76 %
Platelets: 193 10*3/uL (ref 150–400)
RBC: 4.37 MIL/uL (ref 3.87–5.11)
RDW: 15.6 % — ABNORMAL HIGH (ref 11.5–15.5)
WBC: 5.4 10*3/uL (ref 4.0–10.5)
nRBC: 0 % (ref 0.0–0.2)

## 2019-09-09 LAB — FIBRIN DERIVATIVES D-DIMER (ARMC ONLY): Fibrin derivatives D-dimer (ARMC): 974.4 ng/mL (FEU) — ABNORMAL HIGH (ref 0.00–499.00)

## 2019-09-09 LAB — GLUCOSE, CAPILLARY: Glucose-Capillary: 214 mg/dL — ABNORMAL HIGH (ref 70–99)

## 2019-09-09 LAB — LACTIC ACID, PLASMA
Lactic Acid, Venous: 1.4 mmol/L (ref 0.5–1.9)
Lactic Acid, Venous: 0.7 mmol/L (ref 0.5–1.9)

## 2019-09-09 LAB — TROPONIN I (HIGH SENSITIVITY)
Troponin I (High Sensitivity): 11 ng/L (ref <18)
Troponin I (High Sensitivity): 12 ng/L (ref <18)

## 2019-09-09 LAB — SARS CORONAVIRUS 2 BY RT PCR (HOSPITAL ORDER, PERFORMED IN ~~LOC~~ HOSPITAL LAB): SARS Coronavirus 2: POSITIVE — AB

## 2019-09-09 LAB — BRAIN NATRIURETIC PEPTIDE: B Natriuretic Peptide: 35 pg/mL (ref 0.0–100.0)

## 2019-09-09 LAB — PROCALCITONIN: Procalcitonin: <0.1 ng/mL

## 2019-09-09 LAB — ABO/RH: ABO/RH(D): A POS

## 2019-09-09 LAB — C-REACTIVE PROTEIN: CRP: <0.8 mg/dL (ref <1.0)

## 2019-09-09 IMAGING — DX DG CHEST 1V PORT
1 series · 1 of 1 positions shown · non-contrast
Comparison: One-view chest x-ray 02/26/2018

CLINICAL DATA: Shortness of breath.

EXAM:
PORTABLE CHEST 1 VIEW

[chest ap]
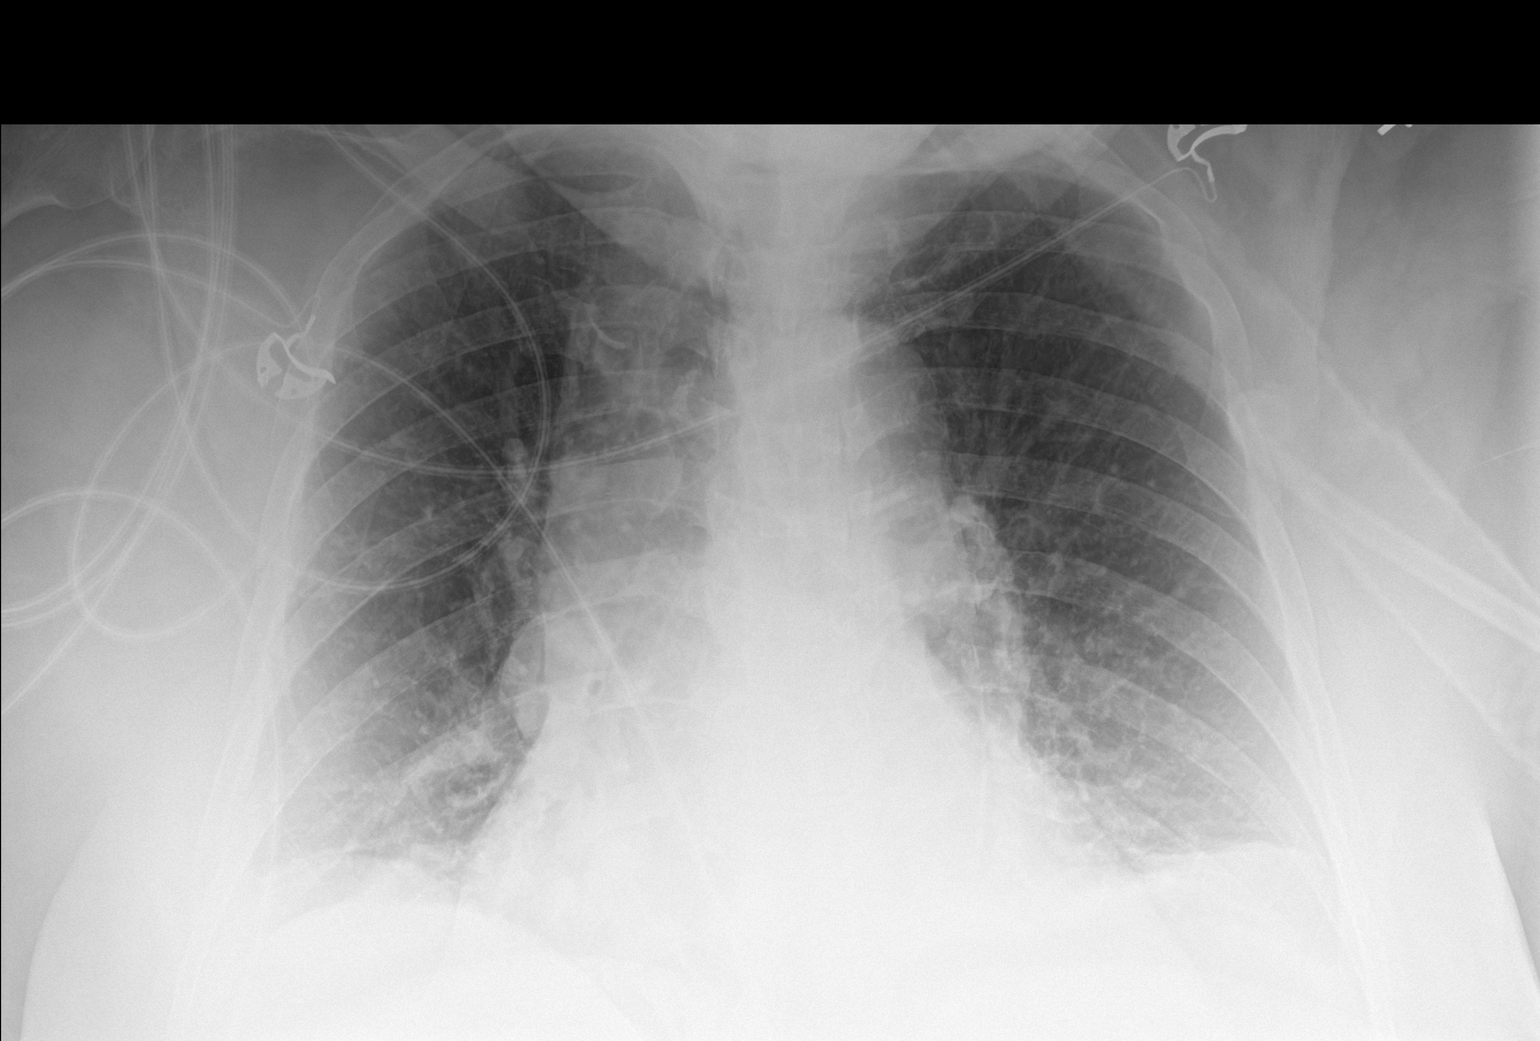

[1 of 1 positions shown; findings below may reference images not displayed]

FINDINGS: The heart size is exaggerated by low lung volumes. Mild pulmonary
vascular congestion is present without frank edema. There are no
effusions. Bibasilar airspace disease likely reflects atelectasis.
IMPRESSION: 1. Continued improvement in aeration at both lung bases with some
residual atelectasis, left greater than right.

## 2019-09-09 MED ORDER — METHYLPREDNISOLONE SODIUM SUCC 125 MG IJ SOLR
60.0000 mg | Freq: Two times a day (BID) | INTRAMUSCULAR | Status: DC
  Administered 2019-09-09: 60 mg via INTRAVENOUS
  Filled 2019-09-09: qty 2

## 2019-09-09 MED ORDER — ACETAMINOPHEN 325 MG PO TABS
650.0000 mg | ORAL_TABLET | Freq: Four times a day (QID) | ORAL | Status: DC | PRN
  Administered 2019-09-09: 650 mg via ORAL
  Filled 2019-09-09: qty 2

## 2019-09-09 MED ORDER — DOCUSATE SODIUM 100 MG PO CAPS: 100.0000 mg | ORAL_CAPSULE | Freq: Two times a day (BID) | ORAL | Status: DC

## 2019-09-09 MED ORDER — DICLOFENAC SODIUM 1 % TD GEL
4.0000 g | Freq: Four times a day (QID) | TRANSDERMAL | Status: DC | PRN
  Filled 2019-09-09: qty 100

## 2019-09-09 MED ORDER — ACETAMINOPHEN 500 MG PO TABS
1000.0000 mg | ORAL_TABLET | Freq: Once | ORAL | Status: AC
  Administered 2019-09-09: 1000 mg via ORAL
  Filled 2019-09-09: qty 2

## 2019-09-09 MED ORDER — ONDANSETRON HCL 4 MG PO TABS: 4.0000 mg | ORAL_TABLET | Freq: Four times a day (QID) | ORAL | Status: DC | PRN

## 2019-09-09 MED ORDER — DARIFENACIN HYDROBROMIDE ER 7.5 MG PO TB24: 7.5000 mg | ORAL_TABLET | Freq: Every day | ORAL | Status: DC

## 2019-09-09 MED ORDER — INSULIN ASPART 100 UNIT/ML ~~LOC~~ SOLN
0.0000 [IU] | Freq: Three times a day (TID) | SUBCUTANEOUS | Status: DC
  Administered 2019-09-10: 3 [IU] via SUBCUTANEOUS
  Administered 2019-09-10: 2 [IU] via SUBCUTANEOUS
  Administered 2019-09-10: 3 [IU] via SUBCUTANEOUS
  Administered 2019-09-11: 5 [IU] via SUBCUTANEOUS
  Administered 2019-09-11: 2 [IU] via SUBCUTANEOUS
  Administered 2019-09-11: 3 [IU] via SUBCUTANEOUS
  Administered 2019-09-12: 8 [IU] via SUBCUTANEOUS
  Administered 2019-09-13: 2 [IU] via SUBCUTANEOUS
  Administered 2019-09-13: 5 [IU] via SUBCUTANEOUS
  Administered 2019-09-14: 3 [IU] via SUBCUTANEOUS

## 2019-09-09 MED ORDER — FUROSEMIDE 10 MG/ML IJ SOLN
40.0000 mg | Freq: Once | INTRAMUSCULAR | Status: AC
  Administered 2019-09-09: 40 mg via INTRAVENOUS
  Filled 2019-09-09: qty 4

## 2019-09-09 MED ORDER — CYCLOBENZAPRINE HCL 10 MG PO TABS
5.0000 mg | ORAL_TABLET | Freq: Once | ORAL | Status: AC
  Administered 2019-09-09: 5 mg via ORAL
  Filled 2019-09-09: qty 1

## 2019-09-09 MED ORDER — METHYLPREDNISOLONE SODIUM SUCC 40 MG IJ SOLR
40.0000 mg | Freq: Two times a day (BID) | INTRAMUSCULAR | Status: DC
  Administered 2019-09-09: 40 mg via INTRAVENOUS
  Filled 2019-09-09: qty 1

## 2019-09-09 MED ORDER — FUROSEMIDE 40 MG PO TABS: 40.0000 mg | ORAL_TABLET | Freq: Every day | ORAL | Status: DC

## 2019-09-09 MED ORDER — CYCLOSPORINE 0.05 % OP EMUL: 1.0000 [drp] | Freq: Two times a day (BID) | OPHTHALMIC | Status: DC

## 2019-09-09 MED ORDER — TRAMADOL HCL 50 MG PO TABS
50.0000 mg | ORAL_TABLET | Freq: Two times a day (BID) | ORAL | Status: DC | PRN
  Administered 2019-09-09: 50 mg via ORAL
  Filled 2019-09-09: qty 1

## 2019-09-09 MED ORDER — PANTOPRAZOLE SODIUM 20 MG PO TBEC: 20.0000 mg | DELAYED_RELEASE_TABLET | Freq: Every day | ORAL | Status: DC

## 2019-09-09 MED ORDER — POTASSIUM CHLORIDE ER 10 MEQ PO TBCR: 20.0000 meq | EXTENDED_RELEASE_TABLET | Freq: Every day | ORAL | Status: DC

## 2019-09-09 MED ORDER — ALBUTEROL SULFATE HFA 108 (90 BASE) MCG/ACT IN AERS
2.0000 | INHALATION_SPRAY | RESPIRATORY_TRACT | Status: DC
  Filled 2019-09-09: qty 6.7

## 2019-09-09 MED ORDER — CARBOXYMETHYLCELLULOSE SOD PF 1 % OP GEL: 1.0000 [drp] | Freq: Four times a day (QID) | OPHTHALMIC | Status: DC

## 2019-09-09 MED ORDER — GABAPENTIN 300 MG PO CAPS
600.0000 mg | ORAL_CAPSULE | Freq: Two times a day (BID) | ORAL | Status: DC
  Administered 2019-09-09: 600 mg via ORAL
  Filled 2019-09-09: qty 2

## 2019-09-09 MED ORDER — ATORVASTATIN CALCIUM 20 MG PO TABS: 20.0000 mg | ORAL_TABLET | Freq: Every day | ORAL | Status: DC

## 2019-09-09 MED ORDER — MIRABEGRON ER 25 MG PO TB24: 25.0000 mg | ORAL_TABLET | Freq: Every day | ORAL | Status: DC

## 2019-09-09 MED ORDER — TIOTROPIUM BROMIDE MONOHYDRATE 18 MCG IN CAPS
18.0000 ug | ORAL_CAPSULE | Freq: Every day | RESPIRATORY_TRACT | Status: DC
  Filled 2019-09-09: qty 5

## 2019-09-09 MED ORDER — ENOXAPARIN SODIUM 80 MG/0.8ML ~~LOC~~ SOLN
65.0000 mg | SUBCUTANEOUS | Status: DC
  Administered 2019-09-09 – 2019-09-13 (×5): 65 mg via SUBCUTANEOUS
  Filled 2019-09-09 (×5): qty 0.8

## 2019-09-09 MED ORDER — CLOTRIMAZOLE 1 % EX CREA: TOPICAL_CREAM | Freq: Two times a day (BID) | CUTANEOUS | Status: DC

## 2019-09-09 MED ORDER — INSULIN ASPART 100 UNIT/ML ~~LOC~~ SOLN
0.0000 [IU] | Freq: Every day | SUBCUTANEOUS | Status: DC
  Administered 2019-09-09 – 2019-09-13 (×2): 2 [IU] via SUBCUTANEOUS

## 2019-09-09 MED ORDER — SODIUM CHLORIDE 0.9 % IV SOLN
100.0000 mg | INTRAVENOUS | Status: AC
  Administered 2019-09-10 – 2019-09-13 (×4): 100 mg via INTRAVENOUS
  Filled 2019-09-09 (×4): qty 20

## 2019-09-09 MED ORDER — GABAPENTIN 300 MG PO CAPS: 300.0000 mg | ORAL_CAPSULE | Freq: Two times a day (BID) | ORAL | Status: DC

## 2019-09-09 MED ORDER — FLUTICASONE PROPIONATE 50 MCG/ACT NA SUSP: 1.0000 | Freq: Every day | NASAL | Status: DC

## 2019-09-09 MED ORDER — ONDANSETRON HCL 4 MG/2ML IJ SOLN: 4.0000 mg | Freq: Four times a day (QID) | INTRAMUSCULAR | Status: DC | PRN

## 2019-09-09 MED ORDER — SODIUM CHLORIDE 0.9 % IV SOLN
200.0000 mg | Freq: Once | INTRAVENOUS | Status: AC
  Administered 2019-09-09: 200 mg via INTRAVENOUS
  Filled 2019-09-09: qty 40

## 2019-09-09 MED ORDER — SENNOSIDES-DOCUSATE SODIUM 8.6-50 MG PO TABS: 1.0000 | ORAL_TABLET | Freq: Every evening | ORAL | Status: DC | PRN

## 2019-09-09 MED ORDER — MOMETASONE FURO-FORMOTEROL FUM 200-5 MCG/ACT IN AERO: 2.0000 | INHALATION_SPRAY | Freq: Two times a day (BID) | RESPIRATORY_TRACT | Status: DC

## 2019-09-09 MED ORDER — TRAZODONE HCL 50 MG PO TABS: 25.0000 mg | ORAL_TABLET | Freq: Every day | ORAL | Status: DC

## 2019-09-09 MED ORDER — LORATADINE 10 MG PO TABS: 10.0000 mg | ORAL_TABLET | Freq: Every day | ORAL | Status: DC

## 2019-09-09 MED ORDER — IOHEXOL 350 MG/ML SOLN
75.0000 mL | Freq: Once | INTRAVENOUS | Status: AC | PRN
  Administered 2019-09-09: 100 mL via INTRAVENOUS

## 2019-09-09 MED ORDER — FUROSEMIDE 10 MG/ML IJ SOLN
40.0000 mg | Freq: Two times a day (BID) | INTRAMUSCULAR | Status: AC
  Administered 2019-09-09 – 2019-09-11 (×4): 40 mg via INTRAVENOUS
  Filled 2019-09-09 (×4): qty 4

## 2019-09-09 NOTE — ED Provider Notes (Signed)
[facility] Emergency Department Provider Note   ____________________________________________   First MD Initiated Contact with Patient 09/09/19 [phone]     (approximate)  I have reviewed the triage vital signs and the nursing notes.   HISTORY  Chief Complaint Respiratory Distress    HPI Karen Perez is a 68 y.o. female brought to the ED via EMS from [facility] for shortness of breath and fever.  Patient with a history of COPD and CHF on continuous oxygen.  Staff found oxygen saturations to be 77% on 2 L.  EMS dialed her up to 6 L and at arrival she is 4 L saturating in the upper 90s.  Patient denies complaints.  In fact, patient did not want to come in the first place but her daughter insisted.  She was found to have a temperature of 100.3 F at the facility.  Patient endorses mild cough which she attributes to "allergies".  Denies chest pain, abdominal pain, nausea, vomiting, diarrhea.       Past Medical History:  Diagnosis Date   Arthritis    Asthma    Brain aneurysm    CHF (congestive heart failure) (HCC)    COPD (chronic obstructive pulmonary disease) (HCC)    Diabetes mellitus without complication (HCC)    Hypertension    Stroke (HCC) 2003    Patient Active Problem List   Diagnosis Date Noted   Chronic diastolic heart failure (HCC) 07/08/2018   Abnormality of gait 07/05/2018   Acute respiratory failure with hypoxia (HCC) 02/22/2018   COPD with acute exacerbation (HCC) 02/22/2018   Diabetes mellitus type 2 in obese (HCC) 02/22/2018   Acute respiratory failure with hypoxia and hypercapnia (HCC) 02/22/2018   HOT FLASHES 05/02/2010   DIABETES MELLITUS, TYPE II 01/24/2010   SKIN TAG 10/29/2009   KNEE PAIN, BILATERAL 10/04/2009   CHRONIC RHINITIS 08/01/2009   VOCAL CORD DISORDER 08/01/2009   GERD 08/01/2009   HYPOXEMIA 05/21/2009   Nonspecific (abnormal) findings on radiological and other examination of body  structure 05/16/2009   ABNORMAL CHEST XRAY 05/16/2009   OBESITY, MORBID 04/30/2009   TOBACCO USER 04/30/2009   TINEA CORPORIS 04/19/2009   HEMOCCULT POSITIVE STOOL 02/07/2009   LEG CRAMPS 12/24/2008   DERMATITIS, ALLERGIC 06/28/2008   BACK PAIN, LUMBAR 05/04/2008   OBSTRUCTIVE SLEEP APNEA 10/17/2007   Essential hypertension 04/20/2007   HYPERLIPIDEMIA 03/21/2007   DEPRESSION 03/21/2007   CEREBRAL ANEURYSM 03/21/2007   COPD (chronic obstructive pulmonary disease) (HCC) 03/21/2007   ARTHRITIS 03/21/2007   DVT, HX OF 03/21/2007    Past Surgical History:  Procedure Laterality Date   CEREBRAL ANEURYSM REPAIR  2013    Prior to Admission medications   Medication Sig Start Date End Date Taking? Authorizing Provider  acetaminophen (TYLENOL 8 HOUR ARTHRITIS PAIN) 650 MG CR tablet Take 1,300 mg by mouth every 8 (eight) hours as needed for pain.    [provider]  albuterol (VENTOLIN HFA) 108 (90 Base) MCG/ACT inhaler  04/25/19   [provider]  atorvastatin (LIPITOR) 20 MG tablet Take 1 tablet (20 mg total) by mouth daily. 08/22/18   Lyndon CodeKhan, Fozia M, MD  clopidogrel (PLAVIX) 75 MG tablet Take 1 tablet (75 mg total) by mouth daily. 04/25/18   Carlean JewsBoscia, Heather E, NP  clotrimazole (LOTRIMIN) 1 % cream Apply to affected area 2 times daily 05/11/18   Mesner, Barbara CowerJason, MD  clotrimazole-betamethasone (LOTRISONE) cream  06/09/19   [provider]  cyclobenzaprine (FLEXERIL) 5 MG tablet  05/23/19  [provider]  diclofenac sodium (VOLTAREN) 1 % GEL Apply topically 4 (four) times daily.    [provider]  docusate sodium (COLACE) 100 MG capsule Take by mouth.    [provider]  fluticasone (FLONASE) 50 MCG/ACT nasal spray Place 2 sprays into both nostrils daily.    [provider]  Fluticasone-Salmeterol (ADVAIR) 250-50 MCG/DOSE AEPB Inhale 1 puff into the lungs 2 (two) times daily. 12/16/17   Carlean Jews, NP  furosemide  (LASIX) 40 MG tablet Take 1 tablet (40 mg total) by mouth 2 (two) times daily. Patient taking differently: Take 40 mg by mouth daily.  04/26/18   Lyndon Code, MD  gabapentin (NEURONTIN) 100 MG capsule Take 600 mg by mouth 2 (two) times daily.     [provider]  ketoconazole (NIZORAL) 2 % cream  07/13/19   [provider]  lidocaine (LIDODERM) 5 % Place 2 patches onto the skin daily. Remove & Discard patch within 12 hours or as directed by MD    [provider]  lisinopril (PRINIVIL,ZESTRIL) 2.5 MG tablet Take 1 tablet (2.5 mg total) by mouth daily. 03/15/18   Lyndon Code, MD  metFORMIN (GLUCOPHAGE) 500 MG tablet Take 500 mg by mouth daily with breakfast.    [provider]  [facility] powder  04/10/19   [provider]  pantoprazole (PROTONIX) 20 MG tablet Take 20 mg by mouth daily.    [provider]  potassium chloride (K-DUR) 10 MEQ tablet TAKE 2 TABLETS BY MOUTH EVERY DAY Patient taking differently: Take 20 mEq by mouth daily.  05/20/18   Lyndon Code, MD  potassium chloride SA (K-DUR) 20 MEQ tablet  05/30/19   [provider]  RESTASIS 0.05 % ophthalmic emulsion  07/11/19   [provider]  solifenacin (VESICARE) 5 MG tablet Take 1 tablet (5 mg total) by mouth daily. 06/14/19 06/13/20  Jerilee Field, MD  tiotropium (SPIRIVA HANDIHALER) 18 MCG inhalation capsule Use once day 03/23/18   Lyndon Code, MD  traMADol (ULTRAM) 50 MG tablet TAKE 1 TABLET BY MOUTH TWICE A DAY FOR KNEE PAIN AS NEEDED Patient taking differently: every 12 (twelve) hours as needed.  10/28/17   Carlean Jews, NP  traZODone (DESYREL) 50 MG tablet  06/10/19   [provider]    Allergies Amoxicillin, Amoxicillin-pot clavulanate, Bacitracin, and Neosporin [neomycin-bacitracin zn-polymyx]  Family History  Problem Relation Age of Onset   Diabetes Mellitus II Daughter     Social History Social History   Tobacco Use   Smoking status:  Former Smoker    Packs/day: 1.00    Years: 50.00    Pack years: 50.00    Types: Cigarettes    Quit date: 02/15/2018    Years since quitting: 1.5   Smokeless tobacco: Never Used  Substance Use Topics   Alcohol use: No   Drug use: No    Review of Systems  Constitutional: No fever/chills Eyes: No visual changes. ENT: No sore throat. Cardiovascular: Denies chest pain. Respiratory: Positive for shortness of breath. Gastrointestinal: No abdominal pain.  No nausea, no vomiting.  No diarrhea.  No constipation. Genitourinary: Negative for dysuria. Musculoskeletal: Negative for back pain. Skin: Negative for rash. Neurological: Negative for headaches, focal weakness or numbness.   ____________________________________________   PHYSICAL EXAM:  VITAL SIGNS: ED Triage Vitals  Enc Vitals Group     BP      Pulse      Resp  Temp      Temp src      SpO2      Weight      Height      Head Circumference      [place] Flow      Pain Score      Pain Loc      Pain Edu?      Excl. in GC?     Constitutional: Alert and oriented.  Elderly appearing and in mild acute distress. Eyes: Conjunctivae are normal. PERRL. EOMI. Head: Atraumatic. Nose: No congestion/rhinnorhea. Mouth/Throat: Mucous membranes are moist.  Oropharynx non-erythematous. Neck: No stridor.   Cardiovascular: Normal rate, regular rhythm. Grossly normal heart sounds.  Good peripheral circulation. Respiratory: Increased respiratory effort.  No retractions. Lungs diminished. Gastrointestinal: Soft and nontender. No distention. No abdominal bruits. No CVA tenderness. Musculoskeletal: No lower extremity tenderness.  2+ BLE nonpitting edema.  No joint effusions. Neurologic:  Normal speech and language. No gross focal neurologic deficits are appreciated.  Skin:  Skin is warm, dry and intact. No rash noted. Psychiatric: Mood and affect are normal. Speech and behavior are  normal.  ____________________________________________   LABS (all labs ordered are listed, but only abnormal results are displayed)  Labs Reviewed  CBC WITH DIFFERENTIAL/PLATELET - Abnormal; Notable for the following components:      Result Value   MCHC 28.3 (*)    RDW 15.6 (*)    Lymphs Abs 0.6 (*)    All other components within normal limits  URINALYSIS, COMPLETE (UACMP) WITH MICROSCOPIC - Abnormal; Notable for the following components:   Color, Urine YELLOW (*)    APPearance HAZY (*)    Bacteria, UA MANY (*)    All other components within normal limits  COMPREHENSIVE METABOLIC PANEL - Abnormal; Notable for the following components:   Chloride 94 (*)    CO2 38 (*)    Glucose, Bld 105 (*)    Calcium 8.6 (*)    Albumin 3.4 (*)    All other components within normal limits  CULTURE, BLOOD (ROUTINE X 2)  CULTURE, BLOOD (ROUTINE X 2)  URINE CULTURE  SARS CORONAVIRUS 2 BY RT PCR (HOSPITAL ORDER, PERFORMED IN  HOSPITAL LAB)  LACTIC ACID, PLASMA  BRAIN NATRIURETIC PEPTIDE  LACTIC ACID, PLASMA  TROPONIN I (HIGH SENSITIVITY)  TROPONIN I (HIGH SENSITIVITY)   ____________________________________________  EKG  ED ECG REPORT I, Mandel Seiden J, the attending physician, personally viewed and interpreted this ECG.   Date: 09/09/2019  EKG Time: 0526  Rate: 97  Rhythm: normal EKG, normal sinus rhythm  Axis: Normal  Intervals:none  ST&T Change: Nonspecific  ____________________________________________  RADIOLOGY  ED MD interpretation: CHF  Official radiology report(s): Dg Chest Port 1 View  Result Date: 09/09/2019 CLINICAL DATA:  Short of breath. EXAM: PORTABLE CHEST 1 VIEW COMPARISON:  03/19/2018 FINDINGS: Mild cardiac enlargement. Pulmonary vascular congestion and borderline interstitial edema noted. Decreased lung volumes. No airspace opacifications. IMPRESSION: 1. Suspect mild CHF. Electronically Signed   By: Signa Kell M.D.   On: 09/09/2019 06:11     ____________________________________________   PROCEDURES  Procedure(s) performed (including Critical Care):  Procedures  CRITICAL CARE Performed by: Irean Hong   Total critical care time: 30 minutes  Critical care time was exclusive of separately billable procedures and treating other patients.  Critical care was necessary to treat or prevent imminent or life-threatening deterioration.  Critical care was time spent personally by me on the following activities: development of treatment plan with patient and/or  surrogate as well as nursing, discussions with consultants, evaluation of patient's response to treatment, examination of patient, obtaining history from patient or surrogate, ordering and performing treatments and interventions, ordering and review of laboratory studies, ordering and review of radiographic studies, pulse oximetry and re-evaluation of patient's condition.  ____________________________________________   INITIAL IMPRESSION / ASSESSMENT AND PLAN / ED COURSE  As part of my medical decision making, I reviewed the following data within the [place] notes reviewed and incorporated, Labs reviewed, EKG interpreted, Old chart reviewed, Radiograph reviewed and Notes from prior ED visits     Karen Perez was evaluated in Emergency Department on 09/09/2019 for the symptoms described in the history of present illness. She was evaluated in the context of the global COVID-19 pandemic, which necessitated consideration that the patient might be at risk for infection with the SARS-CoV-2 virus that causes COVID-19. Institutional protocols and algorithms that pertain to the evaluation of patients at risk for COVID-19 are in a state of rapid change based on information released by regulatory bodies including the CDC and federal and state organizations. These policies and algorithms were followed during the patient's care in the ED.    68 year old  female with COPD and CHF on continuous oxygen brought to the ED for respiratory distress. Differential includes, but is not limited to, viral syndrome, bronchitis including COPD exacerbation, pneumonia, reactive airway disease including asthma, CHF including exacerbation with or without pulmonary/interstitial edema, pneumothorax, ACS, thoracic trauma, and pulmonary embolism.  Obtain septic work-up, x-ray, COVID-19 swab.  On 4 L nasal cannula oxygen patient saturation is in the upper 90s.  Clinical Course as of Sep 08 700  Sat Sep 09, 2019  2956 Rectal temp 101.2 F.  Will administer oral Tylenol.   [JS]  [id number] Spoke with patient's daughter [name] via telephone [phone] and updated her of patient's status thus far.  Patient will require hospitalization here if Covid negative; transfer to [place] if her Covid is positive.  Daughter appreciative and understanding of the telephone update.   [JS]  0633 40 mg IV Lasix ordered for CHF on chest x-ray.   [JS]  [id number] Care transferred to Dr. Corky Downs at change of shift.   [JS]    Clinical Course User Index [JS] Paulette Blanch, MD     ____________________________________________   FINAL CLINICAL IMPRESSION(S) / ED DIAGNOSES  Final diagnoses:  Respiratory distress  Hypoxia  Fever, unspecified fever cause     ED Discharge Orders    None       Note:  This document was prepared using Dragon voice recognition software and may include unintentional dictation errors.   Paulette Blanch, MD 09/09/19 [phone]

## 2019-09-09 NOTE — ED Provider Notes (Signed)
Notified of positive Covid, will initiate transfer to [facility], Herbie Baltimore, MD 09/09/19 [phone]

## 2019-09-09 NOTE — ED Notes (Signed)
Pt taken to CT via stretcher, placed on 4 L [place] while in CT. Will place back on HFNC when pt returns. No resp distress noted at this time. Pt had been sleeping, states her leg pain feels better.

## 2019-09-09 NOTE — ED Notes (Signed)
Pt pending transfer to [facility] pending acceptance. Family updated via phone. On HFNC with saturation 97%. Will continue to monitor.

## 2019-09-09 NOTE — H&P (Addendum)
History and Physical    Karen PolingLinda J Perez KGM:[id number] DOB: 04/19/1951 DOA: (Not on file)  I have briefly reviewed the patient's prior medical records in [facility]  PCP: Lyndon CodeKhan, Fozia M, MD  Patient coming from: [place] [facility]   Chief Complaint: Low oxygen levels at the nursing home  HPI: Karen Perez is a 68 y.o. female with medical history significant of COPD, DM 2, HTN, prior CVA, HLD, chronic diastolic CHF, OSA on CPAP (1L O2 nightly), [facility], [facility], all s/p clipping) who presents to [facility] from SNF due to shortness of breath and fever.  Staff found her O2 sats to be 77% on 2 L and she required 4 to 6 L on admission to the ED.  She has been having a mild cough, but denies any chest pain, no abdominal pain, no nausea, vomiting or diarrhea.  While in the ED she was progressively hypoxic requiring up titration of O2 up to 15 L high flow nasal cannula.  Patient herself currently denies any shortness of breath and she is not sure why she was brought to the hospital.  She denies any chest congestion but does complain of a cough but states that it somewhat chronic.  She also complains of bilateral lower extremity pain for the past 2 years, nothing has changed.  She denies any abdominal pain, nausea or vomiting.  She did have few diarrheal episodes in the last week.  She is also been complaining of progressive lower extremity swelling over the last week.  ED Course: She is febrile to 101, respiratory rate around 30.  She is normotensive, satting 94% on 15 L high flow.  Blood work reveals normal renal function, CBC is unremarkable.  CRP is less than 0.8, BNP is normal, high-sensitivity troponin unremarkable.  Lactic acid is normal as well.  She does have an elevated D-dimer.  Chest x-ray shows mild CHF.  She was given Lasix in the ED, and underwent a CT angiogram due to elevated D-dimer.  Covid came back positive and she was  [facility]  CT scan of the chest did not show any PE but it did show lower lobe airspace consolidation consistent with pneumonia as well as minimal right-sided pleural effusion, and also right lobe thyroid mass measuring 2.3 x 1.5 cm  Review of Systems: As per HPI otherwise 10 point review of systems negative.   Past Medical History:  Diagnosis Date   Arthritis    Asthma    Brain aneurysm    CHF (congestive heart failure) (HCC)    COPD (chronic obstructive pulmonary disease) (HCC)    Diabetes mellitus without complication (HCC)    Hypertension    Stroke (HCC) 2003    Past Surgical History:  Procedure Laterality Date   CEREBRAL ANEURYSM REPAIR  2013     reports that she quit smoking about 18 months ago. Her smoking use included cigarettes. She has a 50.00 pack-year smoking history. She has never used smokeless tobacco. She reports that she does not drink alcohol or use drugs.  Allergies  Allergen Reactions   Amoxicillin    Amoxicillin-Pot Clavulanate Diarrhea    Has patient had a PCN reaction causing immediate rash, facial/tongue/throat swelling, SOB or lightheadedness with hypotension: No Has patient had a PCN reaction causing severe rash involving mucus membranes or skin necrosis: No Has patient had a PCN reaction that required hospitalization: No Has patient had  a PCN reaction occurring within the last 10 years: No If all of the above answers are "NO", then may proceed with Cephalosporin use.    Bacitracin    Neosporin [Neomycin-Bacitracin Zn-Polymyx] Hives    Family History  Problem Relation Age of Onset   Diabetes Mellitus II Daughter     Prior to Admission medications   Medication Sig Start Date End Date Taking? Authorizing Provider  acetaminophen (TYLENOL 8 HOUR ARTHRITIS PAIN) 650 MG CR tablet Take 1,300 mg by mouth every 8 (eight) hours as needed for pain.    [provider]  albuterol (VENTOLIN HFA) 108 (90 Base) MCG/ACT  inhaler Inhale 1 puff into the lungs every 6 (six) hours as needed.  04/25/19   [provider]  albuterol (VENTOLIN HFA) 108 (90 Base) MCG/ACT inhaler Inhale 2 puffs into the lungs every 6 (six) hours as needed for wheezing or shortness of breath.    [provider]  atorvastatin (LIPITOR) 20 MG tablet Take 1 tablet (20 mg total) by mouth daily. Patient taking differently: Take 20 mg by mouth daily. At 1800 08/22/18   Lyndon Code, MD  Carboxymethylcellulose Sod PF (REFRESH CELLUVISC) 1 % GEL Apply 1 drop to eye 4 (four) times daily.    [provider]  clopidogrel (PLAVIX) 75 MG tablet Take 1 tablet (75 mg total) by mouth daily. 04/25/18   Carlean Jews, NP  clotrimazole (LOTRIMIN) 1 % cream Apply to affected area 2 times daily 05/11/18   Mesner, Barbara Cower, MD  clotrimazole-betamethasone (LOTRISONE) cream  06/09/19   [provider]  cyclobenzaprine (FLEXERIL) 5 MG tablet Take 5 mg by mouth 3 (three) times daily as needed.  05/23/19   [provider]  dextromethorphan-guaiFENesin (ROBITUSSIN-DM) 10-100 MG/5ML liquid Take 10 mLs by mouth every 4 (four) hours as needed for cough.    [provider]  diclofenac sodium (VOLTAREN) 1 % GEL Apply topically 4 (four) times daily.    [provider]  docusate sodium (COLACE) 100 MG capsule Take by mouth 2 (two) times daily.     [provider]  fluticasone (FLONASE) 50 MCG/ACT nasal spray Place 1 spray into both nostrils daily.     [provider]  Fluticasone-Salmeterol (ADVAIR) 250-50 MCG/DOSE AEPB Inhale 1 puff into the lungs 2 (two) times daily. 12/16/17   Carlean Jews, NP  furosemide (LASIX) 40 MG tablet Take 1 tablet (40 mg total) by mouth 2 (two) times daily. Patient taking differently: Take 40 mg by mouth daily.  04/26/18   Lyndon Code, MD  furosemide (LASIX) 40 MG tablet Take 40 mg by mouth daily as needed. Give if weight gain is over 5 lbs in a week and/or for edema     [provider]  gabapentin (NEURONTIN) 300 MG capsule Take 900 mg by mouth daily at 8 pm.     [provider]  gabapentin (NEURONTIN) 300 MG capsule Take 300 mg by mouth 2 (two) times daily.    [provider]  Homeopathic Products [facility] RELIEF) FOAM Apply 1 application topically 2 (two) times daily as needed.    [provider]  ketoconazole (NIZORAL) 2 % cream Apply 1 application topically 2 (two) times daily. Apply to bilateral feet 07/13/19   [provider]  lidocaine (LIDODERM) 5 % Place 2 patches onto the skin daily. Remove & Discard patch within 12 hours or as directed by MD    [provider]  lisinopril (PRINIVIL,ZESTRIL) 2.5 MG tablet  Take 1 tablet (2.5 mg total) by mouth daily. 03/15/18   Lyndon Code, MD  loratadine (CLARITIN) 10 MG tablet Take 10 mg by mouth daily.    [provider]  Melatonin-Pyridoxine (MELATIN PO) Take 5 mg by mouth at bedtime.    [provider]  metFORMIN (GLUCOPHAGE) 500 MG tablet Take 500 mg by mouth 2 (two) times daily with a meal.     [provider]  mirabegron ER (MYRBETRIQ) 25 MG TB24 tablet Take 25 mg by mouth daily.    [provider]  [facility] powder  04/10/19   [provider]  pantoprazole (PROTONIX) 20 MG tablet Take 20 mg by mouth daily as needed.     [provider]  potassium chloride (K-DUR) 10 MEQ tablet TAKE 2 TABLETS BY MOUTH EVERY DAY Patient taking differently: Take 20 mEq by mouth daily.  05/20/18   Lyndon Code, MD  potassium chloride SA (K-DUR) 20 MEQ tablet  05/30/19   [provider]  RESTASIS 0.05 % ophthalmic emulsion Place 1 drop into both eyes 2 (two) times daily.  07/11/19   [provider]  solifenacin (VESICARE) 5 MG tablet Take 1 tablet (5 mg total) by mouth daily. 06/14/19 06/13/20  Jerilee Field, MD  tiotropium (SPIRIVA HANDIHALER) 18 MCG inhalation capsule Use once day 03/23/18   Lyndon Code, MD    traMADol (ULTRAM) 50 MG tablet TAKE 1 TABLET BY MOUTH TWICE A DAY FOR KNEE PAIN AS NEEDED Patient taking differently: 25 mg 3 (three) times daily as needed.  10/28/17   Carlean Jews, NP  traZODone (DESYREL) 50 MG tablet Take 25 mg by mouth daily at 8 pm.  06/10/19   [provider]    Physical Exam: There were no vitals filed for this visit.  Constitutional: Appears comfortable, slightly tachypneic Eyes: PERRL, lids and conjunctivae normal, no scleral icterus ENMT: Mucous membranes are moist.  Neck: normal, supple Respiratory: clear to auscultation bilaterally, no wheezing, no crackles. Normal respiratory effort.  Overall diminished breath sounds, exam difficult due to body habitus Cardiovascular: Regular rate and rhythm, no murmurs / rubs / gallops.  1+ pitting lower extremity edema, good pulses Abdomen: no tenderness, no masses palpated. Bowel sounds positive.  Musculoskeletal: no clubbing / cyanosis.  Skin: no rashes, lesions, ulcers. No induration Neurologic: CN 2-12 grossly intact. Strength 5/5 in all 4.  Psychiatric: Normal judgment and insight. Alert and oriented x 3. Normal mood.   Labs on Admission: I have personally reviewed following labs and imaging studies  CBC: Recent Labs  Lab 09/09/19 0524  WBC 5.4  NEUTROABS 4.1  HGB 12.1  HCT 42.7  MCV 97.7  PLT 193   Basic Metabolic Panel: Recent Labs  Lab 09/09/19 0603  NA 140  K 4.1  CL 94*  CO2 38*  GLUCOSE 105*  BUN 10  CREATININE 0.74  CALCIUM 8.6*   GFR: Estimated Creatinine Clearance: 92.3 mL/min (by C-G formula based on SCr of 0.74 mg/dL). Liver Function Tests: Recent Labs  Lab 09/09/19 0603  AST 16  ALT 14  ALKPHOS 53  BILITOT 0.3  PROT 7.0  ALBUMIN 3.4*   No results for input(s): LIPASE, AMYLASE in the last 168 hours. No results for input(s): AMMONIA in the last 168 hours. Coagulation Profile: No results for input(s): INR, PROTIME in the last 168 hours. Cardiac Enzymes: No  results for input(s): CKTOTAL, CKMB, CKMBINDEX, TROPONINI in the last 168 hours. BNP (last 3 results) No results for  input(s): PROBNP in the last 8760 hours. HbA1C: No results for input(s): HGBA1C in the last 72 hours. CBG: No results for input(s): GLUCAP in the last 168 hours. Lipid Profile: No results for input(s): CHOL, HDL, LDLCALC, TRIG, CHOLHDL, LDLDIRECT in the last 72 hours. Thyroid Function Tests: No results for input(s): TSH, T4TOTAL, FREET4, T3FREE, THYROIDAB in the last 72 hours. Anemia Panel: No results for input(s): VITAMINB12, FOLATE, FERRITIN, TIBC, IRON, RETICCTPCT in the last 72 hours. Urine analysis:    Component Value Date/Time   COLORURINE YELLOW (A) 09/09/2019 0556   APPEARANCEUR HAZY (A) 09/09/2019 0556   LABSPEC 1.017 09/09/2019 0556   PHURINE 6.0 09/09/2019 0556   GLUCOSEU NEGATIVE 09/09/2019 0556   HGBUR NEGATIVE 09/09/2019 0556   HGBUR large 02/07/2009 0000   BILIRUBINUR NEGATIVE 09/09/2019 0556   KETONESUR NEGATIVE 09/09/2019 0556   PROTEINUR NEGATIVE 09/09/2019 0556   UROBILINOGEN negative 02/07/2009 0000   NITRITE NEGATIVE 09/09/2019 0556   LEUKOCYTESUR NEGATIVE 09/09/2019 0556     Radiological Exams on Admission: Dg Chest Port 1 View  Result Date: 09/09/2019 CLINICAL DATA:  Short of breath. EXAM: PORTABLE CHEST 1 VIEW COMPARISON:  03/19/2018 FINDINGS: Mild cardiac enlargement. Pulmonary vascular congestion and borderline interstitial edema noted. Decreased lung volumes. No airspace opacifications. IMPRESSION: 1. Suspect mild CHF. Electronically Signed   By: Kerby Moors M.D.   On: 09/09/2019 06:11    EKG: Independently reviewed.  Sinus rhythm  Assessment/Plan Active Problems:   OBESITY, MORBID   Essential hypertension   COPD (chronic obstructive pulmonary disease) (HCC)   Acute respiratory failure with hypoxia (HCC)   Diabetes mellitus type 2 in obese (HCC)   Chronic diastolic heart failure (HCC)   Principal Problem Acute hypoxic  respiratory failure -Multifactorial component of dCHF, underlying COPD, as well as Covid related pneumonia -Place patient on remdesivir given hypoxia and CT scan findings, placed on steroids -Continue to monitor inflammatory markers, currently CRP is low. -Chest x-ray does not fully explain her profound hypoxia requiring 15 L -Follow-up procalcitonin, without leukocytosis for now hold antibacterial agents  COVID-19 Labs  Recent Labs    09/09/19 0524  CRP <0.8    Lab Results  Component Value Date   SARSCOV2NAA POSITIVE (A) 09/09/2019   Active Problems COPD -No wheezing, this appears stable, continue home medications -Follows with Dr. Halford Chessman as an outpatient  OSA -Hold CPAP given Covid positive  Hypertension -Hold ACE inhibitor while getting diuresed, monitor blood pressure and adjust medications accordingly  Acute on chronic diastolic CHF -Received Lasix in the ED, she does appear to have lower extremity pitting edema, will place on Lasix twice daily x4 doses, monitor strict ins and outs, daily weights.  Closely monitor renal function while diuresing  Type 2 diabetes mellitus -Hold home oral agents, placed on sliding scale, she will be on steroids and anticipate a degree of hyperglycemia.  Most recent A1c in the 5.6 in 2019  Morbid obesity -We will benefit from weight loss  Prior CVA /history of brain aneurysm -On chronic Plavix, continue  Thyroid nodule -We will need outpatient follow-up   DVT prophylaxis: Lovenox  Code Status: Full code  Family Communication: d/w patient Disposition Plan: SNF when ready  Consults called: none    Marzetta Board, MD, PhD [facility]  Contact via [email or web address]  [facility] Office Info P: [phone] F: [phone]   09/09/2019, 4:28 PM

## 2019-09-09 NOTE — ED Notes (Addendum)
Bertha Stakes [phone] - daughter  Solmon Ice [phone]- friend

## 2019-09-09 NOTE — ED Notes (Signed)
Called RT to place pt on HFNC due to saturation 88% on 6L 02.

## 2019-09-09 NOTE — ED Notes (Signed)
emtala reviewed by this RN 

## 2019-09-09 NOTE — ED Notes (Signed)
Pt bedding soiled. Pt changed. Dry linen and chuck placed under pt. Dry brief placed on pt. Pt became SOB and tachypnic with movement in bed.

## 2019-09-09 NOTE — ED Notes (Signed)
This RN called [facility] at [phone] and informed them of pt transfer. Spoke to [place].

## 2019-09-09 NOTE — ED Notes (Signed)
Pt oxygen levels on HFNC at 10 L are dropping to 80's. Increased to 15 L, came up to 90%. Will continue to monitor.

## 2019-09-09 NOTE — ED Triage Notes (Signed)
Pt arrived via [facility] from [facility] with respiratory distress and a fever. Pt did not want to come into the facility but family insisted that she come to be evaluated. Pt had oxygen sats at 77% on 2L that she wears chronically and came up to 96% on 4L with ems.

## 2019-09-09 NOTE — ED Notes (Signed)
Rebekah RN attempted to call report to [facility], no answer. Will continue to attempt to call report. Room 112, [phone]

## 2019-09-09 NOTE — Progress Notes (Signed)
Pharmacy Note - Remdesivir Dosing  O:  ALT: 14 CTa: Lower lobe airspace consolidation consistent with pneumonia. Minimal right pleural effusion. More patchy infiltrate right upper lobe. Requiring supplemental O2: 15L HFNC   A/P:  Patient meets criteria for remdesivir.  Begin remdesivir 200 mg IV x 1, followed by 100 mg IV daily x 4 days  Monitor ALT, clinical progress  Peggyann Juba, PharmD, [place] [phone] 09/09/2019 5:31 PM

## 2019-09-09 NOTE — ED Provider Notes (Signed)
Patient saturations are dropping on nasal cannula, transition to high flow with good response   Lavonia Drafts, MD 09/09/19 [phone]

## 2019-09-10 ENCOUNTER — Encounter (HOSPITAL_COMMUNITY): Payer: Self-pay

## 2019-09-10 DIAGNOSIS — I5032 Chronic diastolic (congestive) heart failure: Secondary | ICD-10-CM | POA: Diagnosis not present

## 2019-09-10 DIAGNOSIS — J9601 Acute respiratory failure with hypoxia: Secondary | ICD-10-CM | POA: Diagnosis not present

## 2019-09-10 DIAGNOSIS — U071 COVID-19: Secondary | ICD-10-CM | POA: Diagnosis not present

## 2019-09-10 DIAGNOSIS — J449 Chronic obstructive pulmonary disease, unspecified: Secondary | ICD-10-CM | POA: Diagnosis not present

## 2019-09-10 LAB — GLUCOSE, CAPILLARY
Glucose-Capillary: 174 mg/dL — ABNORMAL HIGH (ref 70–99)
Glucose-Capillary: 181 mg/dL — ABNORMAL HIGH (ref 70–99)
Glucose-Capillary: 146 mg/dL — ABNORMAL HIGH (ref 70–99)
Glucose-Capillary: 163 mg/dL — ABNORMAL HIGH (ref 70–99)

## 2019-09-10 LAB — D-DIMER, QUANTITATIVE: D-Dimer, Quant: 1.5 ug/mL-FEU — ABNORMAL HIGH (ref 0.00–0.50)

## 2019-09-10 LAB — TYPE AND SCREEN
ABO/RH(D): A POS
Antibody Screen: NEGATIVE

## 2019-09-10 LAB — CBC WITH DIFFERENTIAL/PLATELET
Abs Immature Granulocytes: 0 10*3/uL (ref 0.00–0.07)
Basophils Absolute: 0 10*3/uL (ref 0.0–0.1)
Basophils Relative: 0 %
Eosinophils Absolute: 0 10*3/uL (ref 0.0–0.5)
Eosinophils Relative: 0 %
HCT: 46.4 % — ABNORMAL HIGH (ref 36.0–46.0)
Hemoglobin: 12.9 g/dL (ref 12.0–15.0)
Immature Granulocytes: 0 %
Lymphocytes Relative: 7 %
Lymphs Abs: 0.3 10*3/uL — ABNORMAL LOW (ref 0.7–4.0)
MCH: 27.9 pg (ref 26.0–34.0)
MCHC: 27.8 g/dL — ABNORMAL LOW (ref 30.0–36.0)
MCV: 100.2 fL — ABNORMAL HIGH (ref 80.0–100.0)
Monocytes Absolute: 0.2 10*3/uL (ref 0.1–1.0)
Monocytes Relative: 4 %
Neutro Abs: 3.5 10*3/uL (ref 1.7–7.7)
Neutrophils Relative %: 89 %
Platelets: 186 10*3/uL (ref 150–400)
RBC: 4.63 MIL/uL (ref 3.87–5.11)
RDW: 15.6 % — ABNORMAL HIGH (ref 11.5–15.5)
WBC: 4 10*3/uL (ref 4.0–10.5)
nRBC: 0 % (ref 0.0–0.2)

## 2019-09-10 LAB — HIV ANTIBODY (ROUTINE TESTING W REFLEX): HIV Screen 4th Generation wRfx: NONREACTIVE

## 2019-09-10 LAB — HEMOGLOBIN A1C
Hgb A1c MFr Bld: 6.6 % — ABNORMAL HIGH (ref 4.8–5.6)
Mean Plasma Glucose: 142.72 mg/dL

## 2019-09-10 LAB — COMPREHENSIVE METABOLIC PANEL
ALT: 16 U/L (ref 0–44)
AST: 18 U/L (ref 15–41)
Albumin: 3.7 g/dL (ref 3.5–5.0)
Alkaline Phosphatase: 59 U/L (ref 38–126)
Anion gap: 11 (ref 5–15)
BUN: 14 mg/dL (ref 8–23)
CO2: 41 mmol/L — ABNORMAL HIGH (ref 22–32)
Calcium: 8.6 mg/dL — ABNORMAL LOW (ref 8.9–10.3)
Chloride: 87 mmol/L — ABNORMAL LOW (ref 98–111)
Creatinine, Ser: 0.82 mg/dL (ref 0.44–1.00)
GFR calc Af Amer: >60 mL/min (ref >60)
GFR calc non Af Amer: >60 mL/min (ref >60)
Glucose, Bld: 127 mg/dL — ABNORMAL HIGH (ref 70–99)
Potassium: 4.3 mmol/L (ref 3.5–5.1)
Sodium: 139 mmol/L (ref 135–145)
Total Bilirubin: 0.5 mg/dL (ref 0.3–1.2)
Total Protein: 7.3 g/dL (ref 6.5–8.1)

## 2019-09-10 LAB — PROCALCITONIN: Procalcitonin: <0.1 ng/mL

## 2019-09-10 LAB — C-REACTIVE PROTEIN: CRP: 3.1 mg/dL — ABNORMAL HIGH (ref <1.0)

## 2019-09-10 LAB — MAGNESIUM: Magnesium: 2.2 mg/dL (ref 1.7–2.4)

## 2019-09-10 LAB — BRAIN NATRIURETIC PEPTIDE: B Natriuretic Peptide: 31.6 pg/mL (ref 0.0–100.0)

## 2019-09-10 IMAGING — DX DG CHEST 1V PORT
1 series · 1 of 1 positions shown · non-contrast
Comparison: 02/28/2018

CLINICAL DATA: Short of breath

EXAM:
PORTABLE CHEST 1 VIEW

[chest ap]
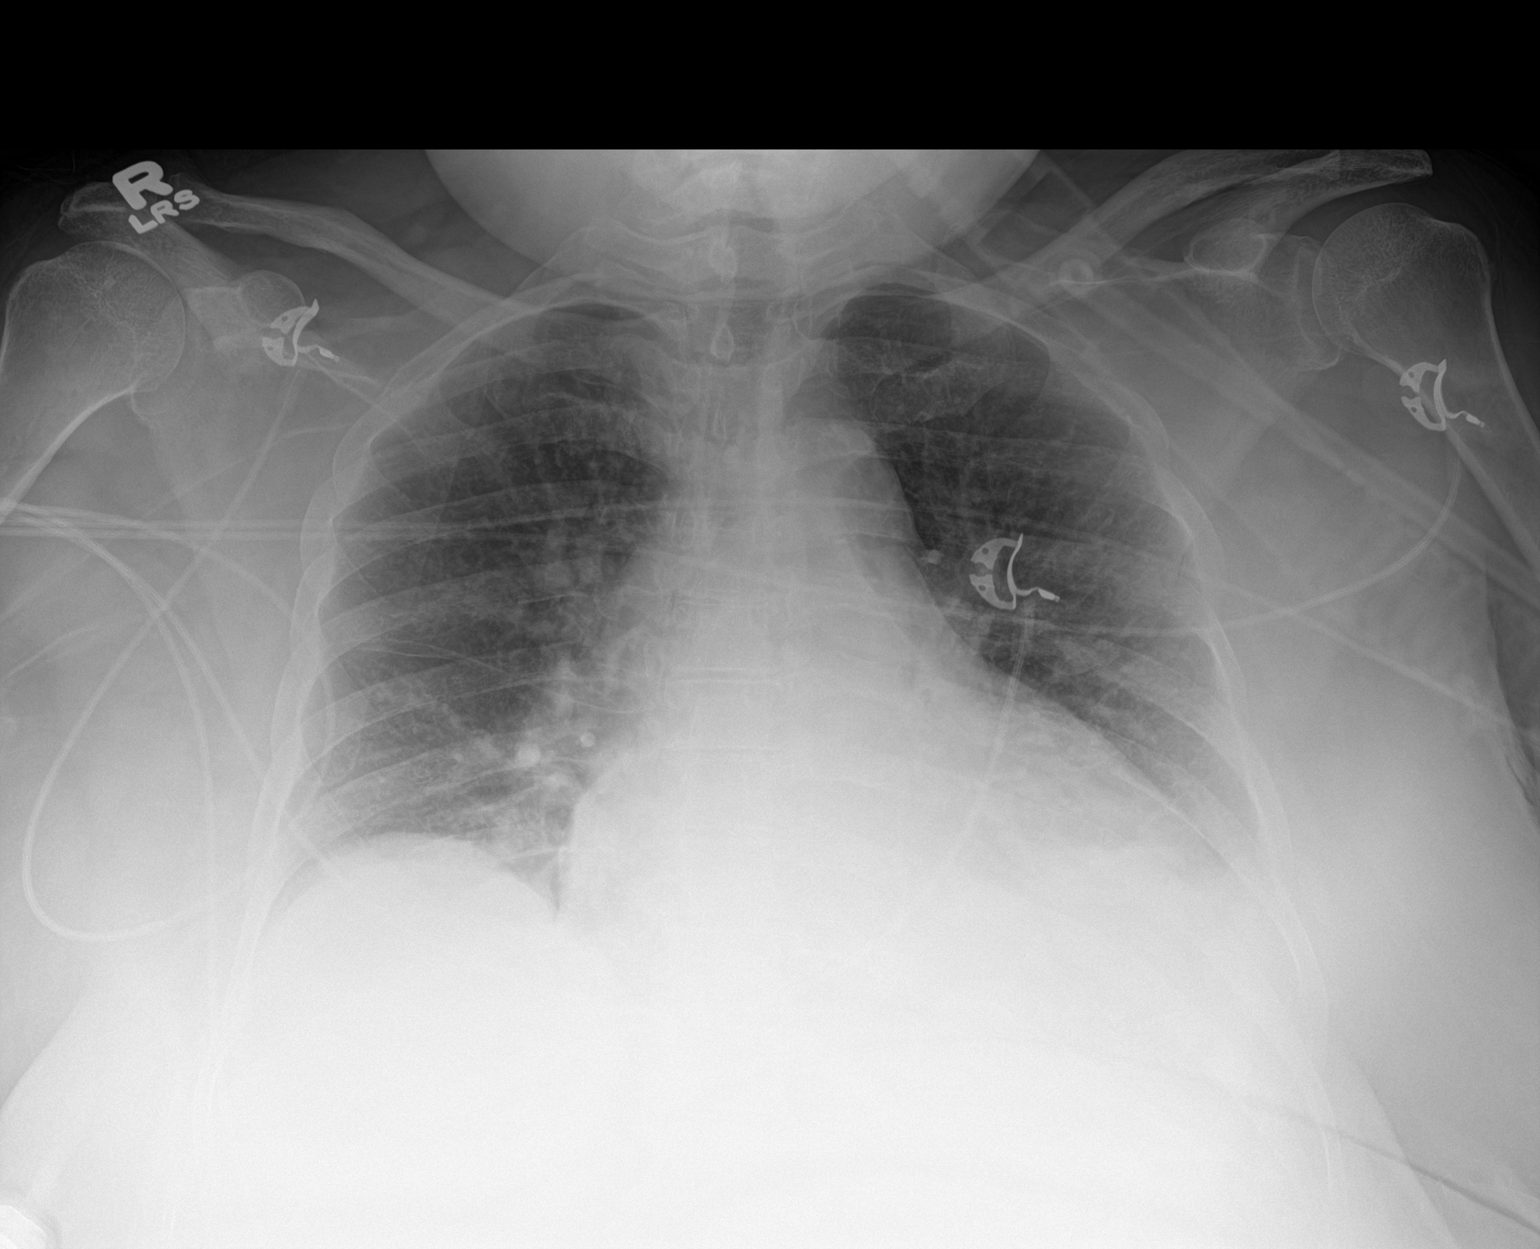

[1 of 1 positions shown; findings below may reference images not displayed]

FINDINGS: Bibasilar airspace disease unchanged.

Pulmonary vascular congestion unchanged.  Cardiac enlargement.
IMPRESSION: No significant interval change. Bibasilar airspace disease possibly
due to hypoventilation.

## 2019-09-10 MED ORDER — FLUTICASONE PROPIONATE 50 MCG/ACT NA SUSP
1.0000 | Freq: Every day | NASAL | Status: DC
  Administered 2019-09-10 – 2019-09-14 (×5): 1 via NASAL
  Filled 2019-09-10: qty 16

## 2019-09-10 MED ORDER — CLOPIDOGREL BISULFATE 75 MG PO TABS
75.0000 mg | ORAL_TABLET | Freq: Every day | ORAL | Status: DC
  Administered 2019-09-10 – 2019-09-14 (×5): 75 mg via ORAL
  Filled 2019-09-10 (×5): qty 1

## 2019-09-10 MED ORDER — GUAIFENESIN-DM 100-10 MG/5ML PO SYRP
10.0000 mL | ORAL_SOLUTION | ORAL | Status: DC | PRN
  Administered 2019-09-10 – 2019-09-14 (×4): 10 mL via ORAL
  Filled 2019-09-10 (×4): qty 10

## 2019-09-10 MED ORDER — METHYLPREDNISOLONE SODIUM SUCC 40 MG IJ SOLR
40.0000 mg | Freq: Two times a day (BID) | INTRAMUSCULAR | Status: DC
  Administered 2019-09-10 – 2019-09-12 (×5): 40 mg via INTRAVENOUS
  Filled 2019-09-10 (×5): qty 1

## 2019-09-10 MED ORDER — CYCLOBENZAPRINE HCL 10 MG PO TABS
5.0000 mg | ORAL_TABLET | Freq: Three times a day (TID) | ORAL | Status: DC | PRN
  Administered 2019-09-13: 5 mg via ORAL
  Filled 2019-09-10 (×2): qty 1

## 2019-09-10 MED ORDER — DEXTROMETHORPHAN-GUAIFENESIN 10-100 MG/5ML PO LIQD
10.0000 mL | ORAL | Status: DC | PRN
  Filled 2019-09-10: qty 10

## 2019-09-10 MED ORDER — INSULIN GLARGINE 100 UNIT/ML ~~LOC~~ SOLN
12.0000 [IU] | Freq: Every day | SUBCUTANEOUS | Status: DC
  Administered 2019-09-10 – 2019-09-14 (×5): 12 [IU] via SUBCUTANEOUS
  Filled 2019-09-10 (×5): qty 0.12

## 2019-09-10 MED ORDER — ATORVASTATIN CALCIUM 10 MG PO TABS
20.0000 mg | ORAL_TABLET | Freq: Every day | ORAL | Status: DC
  Administered 2019-09-10 – 2019-09-13 (×4): 20 mg via ORAL
  Filled 2019-09-10 (×4): qty 2

## 2019-09-10 MED ORDER — MOMETASONE FURO-FORMOTEROL FUM 200-5 MCG/ACT IN AERO
2.0000 | INHALATION_SPRAY | Freq: Two times a day (BID) | RESPIRATORY_TRACT | Status: DC
  Administered 2019-09-10 – 2019-09-14 (×9): 2 via RESPIRATORY_TRACT
  Filled 2019-09-10: qty 8.8

## 2019-09-10 MED ORDER — TRAMADOL HCL 50 MG PO TABS: 25.0000 mg | ORAL_TABLET | Freq: Two times a day (BID) | ORAL | Status: DC | PRN

## 2019-09-10 MED ORDER — MIRABEGRON ER 25 MG PO TB24
25.0000 mg | ORAL_TABLET | Freq: Every day | ORAL | Status: DC
  Administered 2019-09-10 – 2019-09-14 (×5): 25 mg via ORAL
  Filled 2019-09-10 (×6): qty 1

## 2019-09-10 MED ORDER — DARIFENACIN HYDROBROMIDE ER 7.5 MG PO TB24
7.5000 mg | ORAL_TABLET | Freq: Every day | ORAL | Status: DC
  Administered 2019-09-10 – 2019-09-14 (×5): 7.5 mg via ORAL
  Filled 2019-09-10 (×6): qty 1

## 2019-09-10 MED ORDER — TIOTROPIUM BROMIDE MONOHYDRATE 18 MCG IN CAPS: 18.0000 ug | ORAL_CAPSULE | Freq: Every day | RESPIRATORY_TRACT | Status: DC

## 2019-09-10 MED ORDER — PANTOPRAZOLE SODIUM 20 MG PO TBEC
20.0000 mg | DELAYED_RELEASE_TABLET | Freq: Every day | ORAL | Status: DC
  Administered 2019-09-10 – 2019-09-14 (×5): 20 mg via ORAL
  Filled 2019-09-10 (×6): qty 1

## 2019-09-10 MED ORDER — GABAPENTIN 300 MG PO CAPS
300.0000 mg | ORAL_CAPSULE | ORAL | Status: DC
  Administered 2019-09-10 – 2019-09-14 (×9): 300 mg via ORAL
  Filled 2019-09-10 (×9): qty 1

## 2019-09-10 MED ORDER — PNEUMOCOCCAL VAC POLYVALENT 25 MCG/0.5ML IJ INJ
0.5000 mL | INJECTION | INTRAMUSCULAR | Status: AC
  Administered 2019-09-11: 0.5 mL via INTRAMUSCULAR
  Filled 2019-09-10: qty 0.5

## 2019-09-10 MED ORDER — ALBUTEROL SULFATE HFA 108 (90 BASE) MCG/ACT IN AERS
1.0000 | INHALATION_SPRAY | Freq: Four times a day (QID) | RESPIRATORY_TRACT | Status: DC
  Administered 2019-09-10 – 2019-09-14 (×17): 1 via RESPIRATORY_TRACT
  Filled 2019-09-10: qty 6.7

## 2019-09-10 MED ORDER — UMECLIDINIUM BROMIDE 62.5 MCG/INH IN AEPB
1.0000 | INHALATION_SPRAY | Freq: Every day | RESPIRATORY_TRACT | Status: DC
  Administered 2019-09-10 – 2019-09-14 (×5): 1 via RESPIRATORY_TRACT
  Filled 2019-09-10: qty 7

## 2019-09-10 MED ORDER — TRAZODONE HCL 50 MG PO TABS
25.0000 mg | ORAL_TABLET | Freq: Every day | ORAL | Status: DC
  Administered 2019-09-10 – 2019-09-13 (×4): 25 mg via ORAL
  Filled 2019-09-10 (×4): qty 1

## 2019-09-10 NOTE — Progress Notes (Signed)
PROGRESS NOTE                                                                                                                                                                                                             Patient Demographics:    Karen Perez, is a 68 y.o. female, DOB - 01/21/1951, WUJ:[id number]  Outpatient Primary MD for the patient is Lavera Guise, MD    LOS - 1  Admit date - 09/09/2019    CC - SOB     Brief Narrative  Karen Perez is a 68 y.o. female with medical history significant of COPD, DM 2, HTN, prior CVA, HLD, chronic diastolic CHF, OSA on CPAP (1L O2 nightly), history of DVT not on [facility] currently, prior multiple cerebral aneurysms followed at [facility] (3, all s/p clipping) who presents to [facility] ER from SNF due to shortness of breath and fever.  Staff found her O2 sats to be 77% on 2 L and she required 4 to 6 L on admission to the ED, work-up suggested COVID-19 pneumonitis and acute on chronic hypoxic respiratory failure and she was admitted to the hospital.   Subjective:    Karen Perez today has, No headache, No chest pain, No abdominal pain - No Nausea, No new weakness tingling or numbness, mild Cough & SOB.     Assessment  & Plan :     1. Acute Hypoxic Resp. Failure due to Acute Covid 19 Viral Pneumonitis during the ongoing 2020 Covid 19 Pandemic - she has been adequately started on IV steroids along with remdesivir, clinically she has stabilized with stable inflammatory markers, was able to sit her in the chair and made her use incentive spirometry for atelectasis.  She is now requiring 4 L nasal cannula oxygen while I was in the room with a pulse ox in mid low 90s.  She is in no respiratory distress.  Continue to monitor closely.  She has been encouraged to sit up in chair and daytime use flutter valve and I-S for pulmonary toiletry and then prone when in bed.     COVID-19 Labs  Recent Labs     09/09/19 0524 09/10/19 0209  DDIMER  --  1.50*  CRP <0.8 3.1*    Lab Results  Component Value Date   SARSCOV2NAA POSITIVE (A) 09/09/2019  SpO2: 92 % O2 Flow Rate (L/min): 8 L/min  Hepatic Function Latest Ref Rng & Units 09/10/2019 09/09/2019 03/19/2018  Total Protein 6.5 - 8.1 g/dL 7.3 7.0 7.0  Albumin 3.5 - 5.0 g/dL 3.7 3.4(L) 3.5  AST 15 - 41 U/L 18 16 18  ALT 0 - 44 U/L 16 14 20  Alk Phosphatase 38 - 126 U/L 59 53 58  Total Bilirubin 0.3 - 1.2 mg/dL 0.5 0.3 0.2(L)  Bilirubin, Direct 0.0 - 0.3 mg/dL - - -        Component Value Date/Time   BNP 35.0 09/09/2019 0520     2.  History of COPD.  Follows with Dr. Sood pulmonary.  No acute issues.  3. OSA.  Oxygen here at night.  Uses CPAP with 1 L oxygen bleed at night at SNF.  4.  Essential hypertension.  Blood pressure is stable will monitor.  5.  Acute on chronic diastolic CHF.  65% on echocardiogram done in April 2019.  She is currently on IV Lasix total of 4 doses.  Will monitor and adjust as needed.  Still has few Rales.  6.  GERD.  PPI.  7. HX Of CVA.  On Plavix and statin for secondary prevention.  PT OT here.  8.  Dyslipidemia.  Continue statin.  9.  Obesity.  BMI 48.  Follow with PCP for weight loss.  10.  Incidental thyroid mass.  PCP to monitor and do age-appropriate outpatient work-up.  11.  DM type II.  Currently on sliding scale since she is getting steroids will add Lantus.  Monitor and adjust.  Lab Results  Component Value Date   HGBA1C 6.6 (H) 09/10/2019   CBG (last 3)  Recent Labs    09/09/19 2054 09/10/19 0757  GLUCAP 214* 174*     Condition -  Guarded  Family Communication  : Called the listed cell phone #4 daughter Tina Helfrich on 09/10/2019 at 9:20 AM.  No response > 8 rings.  Code Status :  Full  Diet :    Diet Order            Diet heart healthy/carb modified Room service appropriate? Yes; Fluid consistency: Thin  Diet effective now               Disposition  Plan  :  Home once Remdesivir course is done  Consults  :  None  Procedures  :   CTA - 1. No demonstrable pulmonary embolus. No thoracic aortic aneurysm or dissection. There is aortic atherosclerosis as well as foci of great vessel and coronary artery calcification. 2. Enlargement of the main pulmonary outflow tract, a finding indicative of pulmonary arterial hypertension. 3. Lower lobe airspace consolidation consistent with pneumonia. Minimal right pleural effusion. More patchy infiltrate right upper lobe. 4. Slightly prominent right pretracheal and subcarinal lymph nodes. Etiology for this lymph node prominence is uncertain. Reactive etiology secondary to the parenchymal lung changes is quite possible. 5. **An incidental finding of potential clinical significance has been found. Right lobe thyroid mass measuring 2.3 x 1.5 cm. Advise nonemergent thyroid ultrasound to further assess.** Aortic Atherosclerosis.   PUD Prophylaxis :  PPI  DVT Prophylaxis  :  Lovenox   Lab Results  Component Value Date   PLT 186 09/10/2019    Inpatient Medications  Scheduled Meds: . albuterol  1 puff Inhalation Q6H  . atorvastatin  20 mg Oral q1800  . clopidogrel  75 mg Oral Daily  . darifenacin  7.5 mg   Oral Daily  . enoxaparin (LOVENOX) injection  65 mg Subcutaneous Q24H  . fluticasone  1 spray Each Nare Daily  . furosemide  40 mg Intravenous Q12H  . gabapentin  300 mg Oral 2 times per day  . insulin aspart  0-15 Units Subcutaneous TID WC  . insulin aspart  0-5 Units Subcutaneous QHS  . methylPREDNISolone (SOLU-MEDROL) injection  60 mg Intravenous Q12H  . mirabegron ER  25 mg Oral Daily  . mometasone-formoterol  2 puff Inhalation BID  . pantoprazole  20 mg Oral Daily  . [START ON 09/11/2019] pneumococcal 23 valent vaccine  0.5 mL Intramuscular Tomorrow-1000  . traZODone  25 mg Oral Q2000  . umeclidinium bromide  1 puff Inhalation Daily   Continuous Infusions: . remdesivir 100 mg in NS 250 mL      PRN Meds:.acetaminophen, cyclobenzaprine, guaiFENesin-dextromethorphan, ondansetron **OR** ondansetron (ZOFRAN) IV, senna-docusate, traMADol  Antibiotics  :    Anti-infectives (From admission, onward)   Start     Dose/Rate Route Frequency Ordered Stop   09/10/19 1600  remdesivir 100 mg in sodium chloride 0.9 % 250 mL IVPB     100 mg 500 mL/hr over 30 Minutes Intravenous Every 24 hours 09/09/19 1731 09/14/19 1559   09/09/19 1830  remdesivir 200 mg in sodium chloride 0.9 % 250 mL IVPB     200 mg 500 mL/hr over 30 Minutes Intravenous Once 09/09/19 1731 09/09/19 1908       Time Spent in minutes  30     M.D on 09/10/2019 at 9:11 AM  To page go to [email or web address] - password [email or web address]  [facility] -  Office  [phone]  See all Orders from today for further details    Objective:   Vitals:   09/09/19 1906 09/10/19 0038 09/10/19 0400 09/10/19 0758  BP: 113/74 123/71 (!) 114/57 122/66  Pulse: 100 96 92 80  Resp: 20   16  Temp: 100.3 F (37.9 C) 98.5 F (36.9 C) 98.8 F (37.1 C) 97.7 F (36.5 C)  TempSrc: Oral Oral Oral Oral  SpO2: 96% 97% 99% 92%  Weight:   133.3 kg   Height:        Wt Readings from Last 3 Encounters:  09/10/19 133.3 kg  09/09/19 134.7 kg  07/26/19 132.5 kg     Intake/Output Summary (Last 24 hours) at 09/10/2019 0911 Last data filed at 09/09/2019 2300 Gross per 24 hour  Intake 120 ml  Output 1200 ml  Net -1080 ml     Physical Exam  Awake Alert,   No new F.N deficits, Normal affect [place].AT,PERRAL Supple Neck,No JVD, No cervical lymphadenopathy appriciated.  Symmetrical Chest wall movement, Good air movement bilaterally, few rales RRR,No Gallops,Rubs or new Murmurs, No Parasternal Heave +ve B.Sounds, Abd Soft, No tenderness, No organomegaly appriciated, No rebound - guarding or rigidity. No Cyanosis, Clubbing or edema, No new Rash or bruise       Data Review:    CBC Recent Labs  Lab 09/09/19 0524 09/10/19 0209  WBC 5.4  4.0  HGB 12.1 12.9  HCT 42.7 46.4*  PLT 193 186  MCV 97.7 100.2*  MCH 27.7 27.9  MCHC 28.3* 27.8*  RDW 15.6* 15.6*  LYMPHSABS 0.6* 0.3*  MONOABS 0.7 0.2  EOSABS 0.0 0.0  BASOSABS 0.0 0.0    Chemistries  Recent Labs  Lab 09/09/19 0603 09/10/19 0209  NA 140 139  K 4.1 4.3  CL 94* 87*  CO2 38* 41*  GLUCOSE 105* 127*  BUN 10   14  CREATININE 0.74 0.82  CALCIUM 8.6* 8.6*  MG  --  2.2  AST 16 18  ALT 14 16  ALKPHOS 53 59  BILITOT 0.3 0.5   ------------------------------------------------------------------------------------------------------------------ No results for input(s): CHOL, HDL, LDLCALC, TRIG, CHOLHDL, LDLDIRECT in the last 72 hours.  Lab Results  Component Value Date   HGBA1C 6.6 (H) 09/10/2019   ------------------------------------------------------------------------------------------------------------------ No results for input(s): TSH, T4TOTAL, T3FREE, THYROIDAB in the last 72 hours.  Invalid input(s): FREET3  Cardiac Enzymes No results for input(s): CKMB, TROPONINI, MYOGLOBIN in the last 168 hours.  Invalid input(s): CK ------------------------------------------------------------------------------------------------------------------    Component Value Date/Time   BNP 35.0 09/09/2019 0520    Micro Results Recent Results (from the past 240 hour(s))  Culture, blood (routine x 2)     Status: None (Preliminary result)   Collection Time: 09/09/19  5:20 AM   Specimen: BLOOD  Result Value Ref Range Status   Specimen Description BLOOD LEFT ANTECUBITAL  Final   Special Requests   Final    BOTTLES DRAWN AEROBIC AND ANAEROBIC Blood Culture results may not be optimal due to an excessive volume of blood received in culture bottles   Culture   Final    NO GROWTH 1 DAY Performed at [facility], [address], [place], [place] [address]    Report Status PENDING  Incomplete  Culture, blood (routine x 2)     Status: None (Preliminary result)    Collection Time: 09/09/19  5:20 AM   Specimen: BLOOD  Result Value Ref Range Status   Specimen Description BLOOD LEFT WRIST  Final   Special Requests   Final    BOTTLES DRAWN AEROBIC AND ANAEROBIC Blood Culture adequate volume   Culture   Final    NO GROWTH 1 DAY Performed at [facility], [address], [place], [place] [address]    Report Status PENDING  Incomplete  SARS Coronavirus 2 by RT PCR (hospital order, performed in [place] hospital lab) Nasopharyngeal Nasopharyngeal Swab     Status: Abnormal   Collection Time: 09/09/19  5:24 AM   Specimen: Nasopharyngeal Swab  Result Value Ref Range Status   SARS Coronavirus 2 POSITIVE (A) NEGATIVE Final    Comment: RESULT CALLED TO, READ BACK BY AND VERIFIED WITH: BRANDY DAVIS 09/09/2019 0718 SJ[facility] (NOTE) If result is NEGATIVE SARS-CoV-2 target nucleic acids are NOT DETECTED. The SARS-CoV-2 RNA is generally detectable in upper and lower  respiratory specimens during the acute phase of infection. The lowest  concentration of SARS-CoV-2 viral copies this assay can detect is 250  copies / mL. A negative result does not preclude SARS-CoV-2 infection  and should not be used as the sole basis for treatment or other  patient management decisions.  A negative result may occur with  improper specimen collection / handling, submission of specimen other  than nasopharyngeal swab, presence of viral mutation(s) within the  areas targeted by this assay, and inadequate number of viral copies  (<250 copies / mL). A negative result must be combined with clinical  observations, patient history, and epidemiological information. If result is POSITIVE SARS-CoV-2 target nucleic acids are DETECTED. The  SARS-CoV-2 RNA is generally detectable in upper and lower  respiratory specimens during the acute phase of infection.  Positive  results are indicative of active infection with SARS-CoV-2.  Clinical  correlation with patient history and other  diagnostic information is  necessary to determine patient infection status.  Positive results do  not rule out  bacterial infection or co-infection with other viruses. If result is PRESUMPTIVE POSTIVE SARS-CoV-2 nucleic acids MAY BE PRESENT.   A presumptive positive result was obtained on the submitted specimen  and confirmed on repeat testing.  While 2019 novel coronavirus  (SARS-CoV-2) nucleic acids may be present in the submitted sample  additional confirmatory testing may be necessary for epidemiological  and / or clinical management purposes  to differentiate between  SARS-CoV-2 and other Sarbecovirus currently known to infect humans.  If clinically indicated additional testing with an alternate test  methodology ([id number]) is ad vised. The SARS-CoV-2 RNA is generally  detectable in upper and lower respiratory specimens during the acute  phase of infection. The expected result is Negative. Fact Sheet for Patients:  https://www.fda.gov/media/136312/download Fact Sheet for Healthcare Providers: https://www.fda.gov/media/136313/download This test is not yet approved or cleared by the [place] FDA and has been authorized for detection and/or diagnosis of SARS-CoV-2 by FDA under an Emergency Use Authorization (EUA).  This EUA will remain in effect (meaning this test can be used) for the duration of the COVID-19 declaration under Section 564(b)(1) of the Act, 21 U.S.C. section 360bbb-3(b)(1), unless the authorization is terminated or revoked sooner. Performed at [facility], [address] [place], [place] [address]     Radiology Reports Ct Angio Chest Pe W And/or Wo Contrast  Result Date: 09/09/2019 CLINICAL DATA:  Shortness of breath and fever. Decreased oxygen saturation EXAM: CT ANGIOGRAPHY CHEST WITH CONTRAST TECHNIQUE: Multidetector CT imaging of the chest was performed using the standard protocol during bolus administration of intravenous contrast. Multiplanar  CT image reconstructions and MIPs were obtained to evaluate the vascular anatomy. CONTRAST:  100mL OMNIPAQUE IOHEXOL 350 MG/ML SOLN COMPARISON:  Chest radiograph September 09, 2019; CT angiogram chest Mar 20, 2018 FINDINGS: Cardiovascular: There is no demonstrable pulmonary embolus. There is no thoracic aortic aneurysm or dissection. There are foci of calcification in the proximal right innominate and proximal left subclavian arteries. Other visualized great vessels appear unremarkable. There are foci aortic atherosclerosis. There are foci of coronary artery calcification. Heart is mildly enlarged. There is no pericardial effusion or pericardial thickening. The main pulmonary outflow tract measures 4.0 cm, enlarged. Mediastinum/Nodes: There is a mass in the right lobe of the thyroid measuring 2.3 x 1.5 cm. There are subcentimeter mediastinal and axillary lymph nodes. There is a right pretracheal lymph node measuring 1.3 x 1.0 cm. There is a subcarinal lymph node measuring 1.3 x 1.2 cm. No esophageal lesions are evident. Lungs/Pleura: There is airspace consolidation in both lower lobes, more on the right than on the left, with a minimal amount of pleural effusion on the right. More patchy infiltrate is noted in the right upper lobe. Upper Abdomen: Visualized upper abdominal structures appear unremarkable and stable. Musculoskeletal: There are foci of degenerative change in the thoracic spine. There are no blastic or lytic bone lesions. No evident chest wall lesions. Review of the MIP images confirms the above findings. IMPRESSION: 1. No demonstrable pulmonary embolus. No thoracic aortic aneurysm or dissection. There is aortic atherosclerosis as well as foci of great vessel and coronary artery calcification. 2. Enlargement of the main pulmonary outflow tract, a finding indicative of pulmonary arterial hypertension. 3. Lower lobe airspace consolidation consistent with pneumonia. Minimal right pleural effusion. More patchy  infiltrate right upper lobe. 4. Slightly prominent right pretracheal and subcarinal lymph nodes. Etiology for this lymph node prominence is uncertain. Reactive etiology secondary to the parenchymal lung changes is quite possible. 5. **An incidental   finding of potential clinical significance has been found. Right lobe thyroid mass measuring 2.3 x 1.5 cm. Advise nonemergent thyroid ultrasound to further assess.** Aortic Atherosclerosis (ICD10-I70.0). Electronically Signed   By: William  Woodruff III M.D.   On: 09/09/2019 16:45   Dg Chest Port 1 View  Result Date: 09/09/2019 CLINICAL DATA:  Short of breath. EXAM: PORTABLE CHEST 1 VIEW COMPARISON:  03/19/2018 FINDINGS: Mild cardiac enlargement. Pulmonary vascular congestion and borderline interstitial edema noted. Decreased lung volumes. No airspace opacifications. IMPRESSION: 1. Suspect mild CHF. Electronically Signed   By: Taylor  Stroud M.D.   On: 09/09/2019 06:11     

## 2019-09-11 DIAGNOSIS — U071 COVID-19: Secondary | ICD-10-CM | POA: Diagnosis not present

## 2019-09-11 DIAGNOSIS — J9601 Acute respiratory failure with hypoxia: Secondary | ICD-10-CM | POA: Diagnosis not present

## 2019-09-11 DIAGNOSIS — J449 Chronic obstructive pulmonary disease, unspecified: Secondary | ICD-10-CM | POA: Diagnosis not present

## 2019-09-11 DIAGNOSIS — I5032 Chronic diastolic (congestive) heart failure: Secondary | ICD-10-CM | POA: Diagnosis not present

## 2019-09-11 LAB — CBC WITH DIFFERENTIAL/PLATELET
Abs Immature Granulocytes: 0 10*3/uL (ref 0.00–0.07)
Basophils Absolute: 0 10*3/uL (ref 0.0–0.1)
Basophils Relative: 0 %
Eosinophils Absolute: 0 10*3/uL (ref 0.0–0.5)
Eosinophils Relative: 0 %
HCT: 45.3 % (ref 36.0–46.0)
Hemoglobin: 12.9 g/dL (ref 12.0–15.0)
Immature Granulocytes: 0 %
Lymphocytes Relative: 16 %
Lymphs Abs: 0.5 10*3/uL — ABNORMAL LOW (ref 0.7–4.0)
MCH: 27.9 pg (ref 26.0–34.0)
MCHC: 28.5 g/dL — ABNORMAL LOW (ref 30.0–36.0)
MCV: 97.8 fL (ref 80.0–100.0)
Monocytes Absolute: 0.3 10*3/uL (ref 0.1–1.0)
Monocytes Relative: 10 %
Neutro Abs: 2.2 10*3/uL (ref 1.7–7.7)
Neutrophils Relative %: 74 %
Platelets: 195 10*3/uL (ref 150–400)
RBC: 4.63 MIL/uL (ref 3.87–5.11)
RDW: 15.3 % (ref 11.5–15.5)
WBC: 3 10*3/uL — ABNORMAL LOW (ref 4.0–10.5)
nRBC: 0 % (ref 0.0–0.2)

## 2019-09-11 LAB — GLUCOSE, CAPILLARY
Glucose-Capillary: 140 mg/dL — ABNORMAL HIGH (ref 70–99)
Glucose-Capillary: 154 mg/dL — ABNORMAL HIGH (ref 70–99)
Glucose-Capillary: 235 mg/dL — ABNORMAL HIGH (ref 70–99)
Glucose-Capillary: 196 mg/dL — ABNORMAL HIGH (ref 70–99)

## 2019-09-11 LAB — COMPREHENSIVE METABOLIC PANEL
ALT: 16 U/L (ref 0–44)
AST: 17 U/L (ref 15–41)
Albumin: 3.3 g/dL — ABNORMAL LOW (ref 3.5–5.0)
Alkaline Phosphatase: 51 U/L (ref 38–126)
Anion gap: 10 (ref 5–15)
BUN: 20 mg/dL (ref 8–23)
CO2: 39 mmol/L — ABNORMAL HIGH (ref 22–32)
Calcium: 8.6 mg/dL — ABNORMAL LOW (ref 8.9–10.3)
Chloride: 90 mmol/L — ABNORMAL LOW (ref 98–111)
Creatinine, Ser: 0.72 mg/dL (ref 0.44–1.00)
GFR calc Af Amer: >60 mL/min (ref >60)
GFR calc non Af Amer: >60 mL/min (ref >60)
Glucose, Bld: 165 mg/dL — ABNORMAL HIGH (ref 70–99)
Potassium: 4 mmol/L (ref 3.5–5.1)
Sodium: 139 mmol/L (ref 135–145)
Total Bilirubin: 0.4 mg/dL (ref 0.3–1.2)
Total Protein: 7.1 g/dL (ref 6.5–8.1)

## 2019-09-11 LAB — C-REACTIVE PROTEIN: CRP: 1.2 mg/dL — ABNORMAL HIGH (ref <1.0)

## 2019-09-11 LAB — BRAIN NATRIURETIC PEPTIDE: B Natriuretic Peptide: 17.7 pg/mL (ref 0.0–100.0)

## 2019-09-11 LAB — MAGNESIUM: Magnesium: 2.1 mg/dL (ref 1.7–2.4)

## 2019-09-11 LAB — PROCALCITONIN: Procalcitonin: <0.1 ng/mL

## 2019-09-11 LAB — D-DIMER, QUANTITATIVE: D-Dimer, Quant: 1.58 ug/mL-FEU — ABNORMAL HIGH (ref 0.00–0.50)

## 2019-09-11 IMAGING — DX DG CHEST 1V PORT
1 series · 1 of 1 positions shown · non-contrast
Comparison: 03/01/2018

CLINICAL DATA: Shortness of breath

EXAM:
PORTABLE CHEST 1 VIEW

[chest ap]
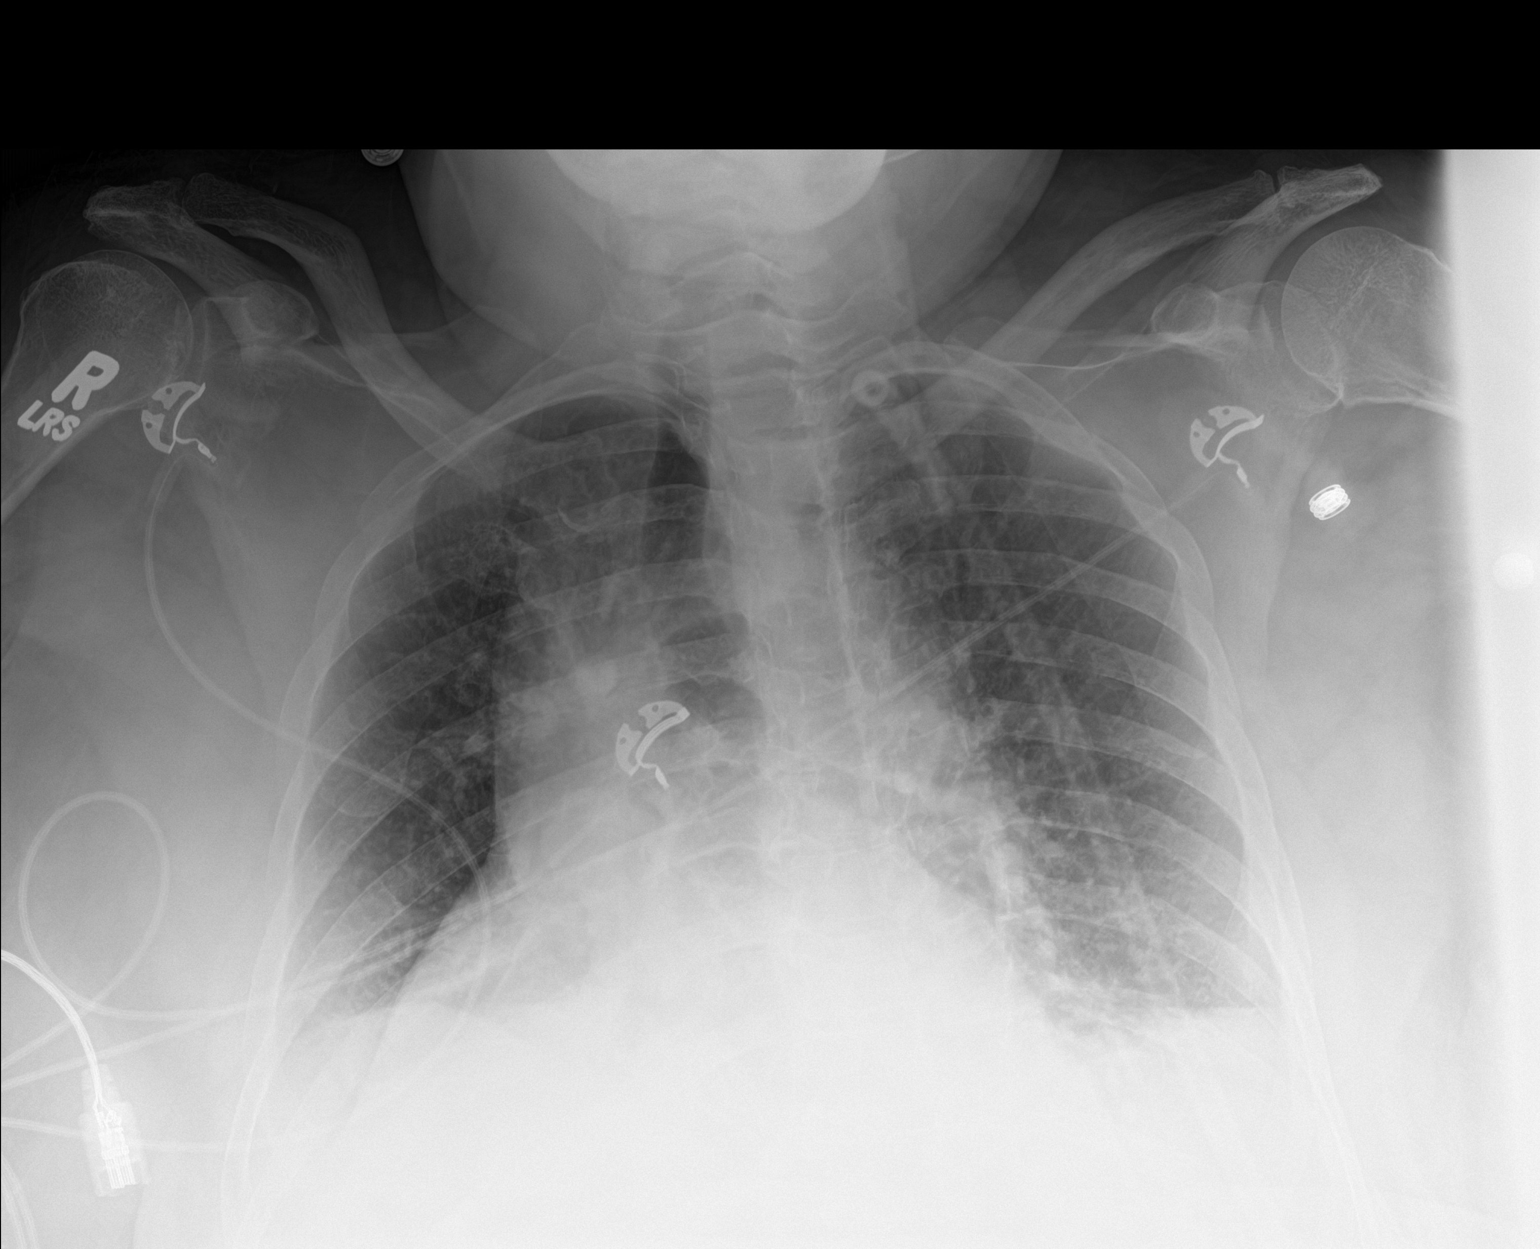

[1 of 1 positions shown; findings below may reference images not displayed]

FINDINGS: Cardiac shadow is mildly enlarged but stable. The lungs are well
aerated bilaterally. Mild bibasilar atelectatic changes are seen. No
focal infiltrate or sizable effusion is seen. No bony abnormality is
noted.
IMPRESSION: Mild bibasilar atelectatic changes.

## 2019-09-11 NOTE — Evaluation (Signed)
Occupational Therapy Evaluation Patient Details Name: Karen Perez MRN: [id number] DOB: 07-30-1951 Today's Date: 09/11/2019    History of Present Illness Karen Perez is a 68 y.o. female with medical history significant of COPD, DM 2, HTN, prior CVA, HLD, chronic diastolic CHF, OSA on CPAP (1L O2 nightly), history of DVTmultiple cerebral aneurysms followed at [facility] (3, all s/p clipping) who presents to [facility] ER from SNF due to shortness of breath and fever. pt. resides in SNF since June. Staff found her O2 sats to be 77% on 2 L.In  the ED, work-up suggested COVID-19 pneumonitis and acute on chronic hypoxic respiratory failure   Clinical Impression   Pt presents with above diagnoses, presenting with generalized weakness and compromised cardiopulmonary status limiting ability to engage in BADL at desired level of independence. PTA pt at ALF, she was mod I with a rollator or w/c for longer distances. She received assistance for bathing per her report and some LB dressing. At time of eval she is on 3L HFNC with poor pleth reading. When reading registered, VSS. Pt min A for sit <> stands and functional mobility with RW. She was able to complete functional mobility to sink in room and complete grooming tasks and have BM on BSC and complete hygiene with set up. Recommend HHOT for increased BADL progression and safety in the home environment. Will continue to follow per POC listed below.     Follow Up Recommendations  Home health OT;Other (comment)(return to facility with [facility])    Equipment Recommendations  None recommended by OT    Recommendations for Other Services       Precautions / Restrictions Precautions Precautions: Fall Precaution Comments: monitor SOP2 Restrictions Weight Bearing Restrictions: No      Mobility Bed Mobility Overal bed mobility: Needs Assistance             General bed mobility comments: received in recliner  Transfers Overall transfer level: Needs  assistance Equipment used: Rolling walker (2 wheeled) Transfers: Sit to/from Stand Sit to Stand: Min assist         General transfer comment: min A to steady and rise, cues for safety and to shift weight anteriorly when standing up    Balance Overall balance assessment: Needs assistance Sitting-balance support: No upper extremity supported;Single extremity supported Sitting balance-Leahy Scale: Fair Sitting balance - Comments: in recliner   Standing balance support: Bilateral upper extremity supported;During functional activity Standing balance-Leahy Scale: Poor Standing balance comment: reliant on UE support                           ADL either performed or assessed with clinical judgement   ADL Overall ADL's : Needs assistance/impaired Eating/Feeding: Set up;Sitting   Grooming: Set up;Sitting;Wash/dry hands;Wash/dry face;Oral care Grooming Details (indicate cue type and reason): sat at sink to brush teeth and wash face. States she does these tasks sitting at baseline Upper Body Bathing: Sitting;Minimal assistance Upper Body Bathing Details (indicate cue type and reason): min A to wash back, able to wash front and under arms Lower Body Bathing: Moderate assistance;Sit to/from stand;Sitting/lateral leans Lower Body Bathing Details (indicate cue type and reason): baseline Upper Body Dressing : Set up;Sitting   Lower Body Dressing: Moderate assistance;Sitting/lateral leans;Sit to/from stand   Toilet Transfer: Minimal Patent examiner Details (indicate cue type and reason): stand pivot for BM Toileting- Clothing Manipulation and Hygiene: Set up;Sit to/from stand Toileting - Clothing Manipulation Details (indicate cue type  and reason): set up A to clean peri area Tub/ Shower Transfer: Minimal assistance;Shower seat;Ambulation;Grab bars;Rolling walker   Functional mobility during ADLs: Minimal assistance;Rolling walker General ADL Comments: pt limited by  generalized weakness and compromised cardiopulmonary status     Vision Patient Visual Report: No change from baseline       Perception     Praxis      Pertinent Vitals/Pain Pain Assessment: No/denies pain     Hand Dominance     Extremity/Trunk Assessment Upper Extremity Assessment Upper Extremity Assessment: Generalized weakness   Lower Extremity Assessment Lower Extremity Assessment: Defer to PT evaluation   Cervical / Trunk Assessment Cervical / Trunk Assessment: Normal   Communication Communication Communication: No difficulties   Cognition Arousal/Alertness: Awake/alert Behavior During Therapy: Flat affect Overall Cognitive Status: Within Functional Limits for tasks assessed                                     General Comments       Exercises General Exercises - Lower Extremity Long Arc Quad: AROM;Both;10 reps;Seated Hip Flexion/Marching: AROM;Both;10 reps;Seated   Shoulder Instructions      Home Living Family/patient expects to be discharged to:: Assisted living                                 Additional Comments: reports being from [facility]      Prior Functioning/Environment Level of Independence: Needs assistance  Gait / Transfers Assistance Needed: mod I with rollator; w/c for longer distances ADL's / Homemaking Assistance Needed: facility assists with bathing; IADLs managed per facility            OT Problem List: Decreased strength;Decreased knowledge of use of DME or AE;Obesity;Decreased activity tolerance;Cardiopulmonary status limiting activity;Impaired balance (sitting and/or standing)      OT Treatment/Interventions: Self-care/ADL training;Therapeutic exercise;Patient/family education;Balance training;Energy conservation;Therapeutic activities;DME and/or AE instruction    OT Goals(Current goals can be found in the care plan section) Acute Rehab OT Goals Patient Stated Goal: regain independence OT Goal  Formulation: With patient Time For Goal Achievement: 09/25/19 Potential to Achieve Goals: Good  OT Frequency: Min 2X/week   Barriers to D/C:            Co-evaluation              AM-PAC OT "6 Clicks" Daily Activity     Outcome Measure Help from another person eating meals?: A Little Help from another person taking care of personal grooming?: A Little Help from another person toileting, which includes using toliet, bedpan, or urinal?: A Little Help from another person bathing (including washing, rinsing, drying)?: A Lot Help from another person to put on and taking off regular upper body clothing?: A Little Help from another person to put on and taking off regular lower body clothing?: A Lot 6 Click Score: 16   End of Session Equipment Utilized During Treatment: Gait belt;Rolling walker;Oxygen Nurse Communication: Mobility status  Activity Tolerance: Patient tolerated treatment well Patient left: in chair;with call bell/phone within reach  OT Visit Diagnosis: Other abnormalities of gait and mobility (R26.89);Muscle weakness (generalized) (M62.81);Unsteadiness on feet (R26.81)                Time: 1351-1431 OT Time Calculation (min): 40 min Charges:  OT General Charges $OT Visit: 1 Visit OT Evaluation $OT Eval Moderate Complexity:  1 Mod OT Treatments $Self Care/Home Management : 23-37 mins  Dalphine HandingKaylee Varetta Chavers, MSOT, OTR/L Behavioral Health OT/ Acute Relief OT [facility]: [phone]    Dalphine HandingKaylee Tiann Saha 09/11/2019, 4:38 PM

## 2019-09-11 NOTE — Progress Notes (Signed)
PROGRESS NOTE                                                                                                                                                                                                             Patient Demographics:    Karen Perez, is a 68 y.o. female, DOB - 1951/10/10, ZDG:[id number]  Outpatient Primary MD for the patient is Lavera Guise, MD    LOS - 2  Admit date - 09/09/2019    CC - SOB     Brief Narrative  Karen Perez is a 68 y.o. female with medical history significant of COPD, DM 2, HTN, prior CVA, HLD, chronic diastolic CHF, OSA on CPAP (1L O2 nightly), history of DVT not on [facility] currently, prior multiple cerebral aneurysms followed at [facility] (3, all s/p clipping) who presents to [facility] ER from SNF due to shortness of breath and fever.  Staff found her O2 sats to be 77% on 2 L and she required 4 to 6 L on admission to the ED, work-up suggested COVID-19 pneumonitis and acute on chronic hypoxic respiratory failure and she was admitted to the hospital.   Subjective:    Patient in bed, appears comfortable, denies any headache, no fever, no chest pain or pressure, no shortness of breath , no abdominal pain. No focal weakness.    Assessment  & Plan :     1. Acute Hypoxic Resp. Failure due to Acute Covid 19 Viral Pneumonitis during the ongoing 2020 Covid 19 Pandemic - she has been adequately started on IV steroids along with remdesivir, clinically she has stabilized with stable inflammatory markers, was able to sit her in the chair and made her use incentive spirometry for atelectasis.  She is now requiring 4 L nasal cannula oxygen while I was in the room with a pulse ox in mid low 90s.  She is in no respiratory distress.  Continue to monitor closely.  She has been encouraged to sit up in chair and daytime use flutter valve and I-S for pulmonary toiletry and then prone when in bed.     COVID-19 Labs   Recent Labs    09/09/19 0524 09/10/19 0209 09/11/19 0424  DDIMER  --  1.50* 1.58*  CRP <0.8 3.1* 1.2*    Lab Results  Component Value Date   SARSCOV2NAA POSITIVE (  A) 09/09/2019     SpO2: 96 % O2 Flow Rate (L/min): 3 L/min  Hepatic Function Latest Ref Rng & Units 09/11/2019 09/10/2019 09/09/2019  Total Protein 6.5 - 8.1 g/dL 7.1 7.3 7.0  Albumin 3.5 - 5.0 g/dL 3.3(L) 3.7 3.4(L)  AST 15 - 41 U/L 17 18 16   ALT 0 - 44 U/L 16 16 14   Alk Phosphatase 38 - 126 U/L 51 59 53  Total Bilirubin 0.3 - 1.2 mg/dL 0.4 0.5 0.3  Bilirubin, Direct 0.0 - 0.3 mg/dL - - -        Component Value Date/Time   BNP 17.7 09/11/2019 0424     2.  History of COPD.  Follows with Dr. Halford Chessman pulmonary.  No acute issues.  3. OSA.  Oxygen here at night.  Uses CPAP with 1 L oxygen bleed at night at SNF.  4.  Essential hypertension.  Blood pressure is stable will monitor.  5.  Acute on chronic diastolic CHF.  65% on echocardiogram done in April 2019. Post 4 doses of IV lasix.  Will monitor and adjust as needed.  Still has few Rales.  6.  GERD.  PPI.  7. HX Of CVA.  On Plavix and statin for secondary prevention.  PT OT here.  8.  Dyslipidemia.  Continue statin.  9.  Obesity.  BMI 48.  Follow with PCP for weight loss.  10.  Incidental thyroid mass.  PCP to monitor and do age-appropriate outpatient work-up.  11.  DM type II.  Currently on sliding scale since she is getting steroids will add Lantus.  Monitor and adjust.  Lab Results  Component Value Date   HGBA1C 6.6 (H) 09/10/2019   CBG (last 3)  Recent Labs    09/10/19 1615 09/10/19 2106 09/11/19 0816  GLUCAP 146* 163* 140*     Condition -  Guarded  Family Communication  : Called the listed cell phone #4 daughter Jamani Eley on 09/10/2019 at 9:20 AM.  No response > 8 rings.  Code Status :  Full  Diet :    Diet Order            Diet heart healthy/carb modified Room service appropriate? Yes; Fluid consistency: Thin  Diet  effective now               Disposition Plan  :  Home once Remdesivir course is done  Consults  :  None  Procedures  :   CTA - 1. No demonstrable pulmonary embolus. No thoracic aortic aneurysm or dissection. There is aortic atherosclerosis as well as foci of great vessel and coronary artery calcification. 2. Enlargement of the main pulmonary outflow tract, a finding indicative of pulmonary arterial hypertension. 3. Lower lobe airspace consolidation consistent with pneumonia. Minimal right pleural effusion. More patchy infiltrate right upper lobe. 4. Slightly prominent right pretracheal and subcarinal lymph nodes. Etiology for this lymph node prominence is uncertain. Reactive etiology secondary to the parenchymal lung changes is quite possible. 5. **An incidental finding of potential clinical significance has been found. Right lobe thyroid mass measuring 2.3 x 1.5 cm. Advise nonemergent thyroid ultrasound to further assess.** Aortic Atherosclerosis.   PUD Prophylaxis :  PPI  DVT Prophylaxis  :  Lovenox   Lab Results  Component Value Date   PLT 195 09/11/2019    Inpatient Medications  Scheduled Meds: . albuterol  1 puff Inhalation Q6H  . atorvastatin  20 mg Oral q1800  . clopidogrel  75 mg Oral Daily  .  darifenacin  7.5 mg Oral Daily  . enoxaparin (LOVENOX) injection  65 mg Subcutaneous Q24H  . fluticasone  1 spray Each Nare Daily  . gabapentin  300 mg Oral 2 times per day  . insulin aspart  0-15 Units Subcutaneous TID WC  . insulin aspart  0-5 Units Subcutaneous QHS  . insulin glargine  12 Units Subcutaneous Daily  . methylPREDNISolone (SOLU-MEDROL) injection  40 mg Intravenous Q12H  . mirabegron ER  25 mg Oral Daily  . mometasone-formoterol  2 puff Inhalation BID  . pantoprazole  20 mg Oral Daily  . traZODone  25 mg Oral Q2000  . umeclidinium bromide  1 puff Inhalation Daily   Continuous Infusions: . remdesivir 100 mg in NS 250 mL 100 mg (09/10/19 1648)   PRN  Meds:.acetaminophen, cyclobenzaprine, guaiFENesin-dextromethorphan, ondansetron **OR** ondansetron (ZOFRAN) IV, senna-docusate, traMADol  Antibiotics  :    Anti-infectives (From admission, onward)   Start     Dose/Rate Route Frequency Ordered Stop   09/10/19 1600  remdesivir 100 mg in sodium chloride 0.9 % 250 mL IVPB     100 mg 500 mL/hr over 30 Minutes Intravenous Every 24 hours 09/09/19 1731 09/14/19 1559   09/09/19 1830  remdesivir 200 mg in sodium chloride 0.9 % 250 mL IVPB     200 mg 500 mL/hr over 30 Minutes Intravenous Once 09/09/19 1731 09/09/19 1908       Time Spent in minutes  30   Lala Lund M.D on 09/11/2019 at 8:52 AM  To page go to [email or web address] - password [email or web address]  [facility] -  Office  [phone]  See all Orders from today for further details    Objective:   Vitals:   09/10/19 2000 09/10/19 2300 09/11/19 0326 09/11/19 0817  BP: 128/71 129/73 (!) 141/70 (!) 111/59  Pulse: 90 92  77  Resp:   18 18  Temp: 98 F (36.7 C) 98.5 F (36.9 C) 98.6 F (37 C) 98 F (36.7 C)  TempSrc: Oral Oral Axillary Oral  SpO2: 93% 92% 96% 96%  Weight:   132.6 kg   Height:        Wt Readings from Last 3 Encounters:  09/11/19 132.6 kg  09/09/19 134.7 kg  07/26/19 132.5 kg     Intake/Output Summary (Last 24 hours) at 09/11/2019 0852 Last data filed at 09/11/2019 0600 Gross per 24 hour  Intake 1330 ml  Output 4300 ml  Net -2970 ml     Physical Exam  Awake Alert,  No new F.N deficits, Normal affect [place].AT,PERRAL Supple Neck,No JVD, No cervical lymphadenopathy appriciated.  Symmetrical Chest wall movement, Good air movement bilaterally, CTAB RRR,No Gallops, Rubs or new Murmurs, No Parasternal Heave +ve B.Sounds, Abd Soft, No tenderness, No organomegaly appriciated, No rebound - guarding or rigidity. No Cyanosis, Clubbing or edema, No new Rash or bruise   Data Review:    CBC Recent Labs  Lab 09/09/19 0524 09/10/19 0209 09/11/19 0424  WBC 5.4  4.0 3.0*  HGB 12.1 12.9 12.9  HCT 42.7 46.4* 45.3  PLT 193 186 195  MCV 97.7 100.2* 97.8  MCH 27.7 27.9 27.9  MCHC 28.3* 27.8* 28.5*  RDW 15.6* 15.6* 15.3  LYMPHSABS 0.6* 0.3* 0.5*  MONOABS 0.7 0.2 0.3  EOSABS 0.0 0.0 0.0  BASOSABS 0.0 0.0 0.0    Chemistries  Recent Labs  Lab 09/09/19 0603 09/10/19 0209 09/11/19 0424  NA 140 139 139  K 4.1 4.3 4.0  CL 94* 87* 90*  CO2 38*  41* 39*  GLUCOSE 105* 127* 165*  BUN 10 14 20   CREATININE 0.74 0.82 0.72  CALCIUM 8.6* 8.6* 8.6*  MG  --  2.2 2.1  AST 16 18 17   ALT 14 16 16   ALKPHOS 53 59 51  BILITOT 0.3 0.5 0.4   ------------------------------------------------------------------------------------------------------------------ No results for input(s): CHOL, HDL, LDLCALC, TRIG, CHOLHDL, LDLDIRECT in the last 72 hours.  Lab Results  Component Value Date   HGBA1C 6.6 (H) 09/10/2019   ------------------------------------------------------------------------------------------------------------------ No results for input(s): TSH, T4TOTAL, T3FREE, THYROIDAB in the last 72 hours.  Invalid input(s): FREET3  Cardiac Enzymes No results for input(s): CKMB, TROPONINI, MYOGLOBIN in the last 168 hours.  Invalid input(s): CK ------------------------------------------------------------------------------------------------------------------    Component Value Date/Time   BNP 17.7 09/11/2019 0424    Micro Results Recent Results (from the past 240 hour(s))  Culture, blood (routine x 2)     Status: None (Preliminary result)   Collection Time: 09/09/19  5:20 AM   Specimen: BLOOD  Result Value Ref Range Status   Specimen Description BLOOD LEFT ANTECUBITAL  Final   Special Requests   Final    BOTTLES DRAWN AEROBIC AND ANAEROBIC Blood Culture results may not be optimal due to an excessive volume of blood received in culture bottles   Culture   Final    NO GROWTH 2 DAYS Performed at [facility], [address], [place],  [place] [address]    Report Status PENDING  Incomplete  Culture, blood (routine x 2)     Status: None (Preliminary result)   Collection Time: 09/09/19  5:20 AM   Specimen: BLOOD  Result Value Ref Range Status   Specimen Description BLOOD LEFT WRIST  Final   Special Requests   Final    BOTTLES DRAWN AEROBIC AND ANAEROBIC Blood Culture adequate volume   Culture   Final    NO GROWTH 2 DAYS Performed at [facility], [address], [place], [place] [address]    Report Status PENDING  Incomplete  SARS Coronavirus 2 by RT PCR (hospital order, performed in [place] hospital lab) Nasopharyngeal Nasopharyngeal Swab     Status: Abnormal   Collection Time: 09/09/19  5:24 AM   Specimen: Nasopharyngeal Swab  Result Value Ref Range Status   SARS Coronavirus 2 POSITIVE (A) NEGATIVE Final    Comment: RESULT CALLED TO, READ BACK BY AND VERIFIED WITH: BRANDY DAVIS 09/09/2019 0718 [facility] (NOTE) If result is NEGATIVE SARS-CoV-2 target nucleic acids are NOT DETECTED. The SARS-CoV-2 RNA is generally detectable in upper and lower  respiratory specimens during the acute phase of infection. The lowest  concentration of SARS-CoV-2 viral copies this assay can detect is 250  copies / mL. A negative result does not preclude SARS-CoV-2 infection  and should not be used as the sole basis for treatment or other  patient management decisions.  A negative result may occur with  improper specimen collection / handling, submission of specimen other  than nasopharyngeal swab, presence of viral mutation(s) within the  areas targeted by this assay, and inadequate number of viral copies  (<250 copies / mL). A negative result must be combined with clinical  observations, patient history, and epidemiological information. If result is POSITIVE SARS-CoV-2 target nucleic acids are DETECTED. The  SARS-CoV-2 RNA is generally detectable in upper and lower  respiratory specimens during the acute phase of infection.   Positive  results are indicative of active infection with SARS-CoV-2.  Clinical  correlation with patient history and other  diagnostic information is  necessary to determine patient infection status.  Positive results do  not rule out bacterial infection or co-infection with other viruses. If result is PRESUMPTIVE POSTIVE SARS-CoV-2 nucleic acids MAY BE PRESENT.   A presumptive positive result was obtained on the submitted specimen  and confirmed on repeat testing.  While 2019 novel coronavirus  (SARS-CoV-2) nucleic acids may be present in the submitted sample  additional confirmatory testing may be necessary for epidemiological  and / or clinical management purposes  to differentiate between  SARS-CoV-2 and other Sarbecovirus currently known to infect humans.  If clinically indicated additional testing with an alternate test  methodology [phone]) is ad vised. The SARS-CoV-2 RNA is generally  detectable in upper and lower respiratory specimens during the acute  phase of infection. The expected result is Negative. Fact Sheet for Patients:  StrictlyIdeas.no Fact Sheet for Healthcare Providers: BankingDealers.co.za This test is not yet approved or cleared by the [place] FDA and has been authorized for detection and/or diagnosis of SARS-CoV-2 by FDA under an Emergency Use Authorization (EUA).  This EUA will remain in effect (meaning this test can be used) for the duration of the COVID-19 declaration under Section 564(b)(1) of the Act, 21 U.S.C. section 360bbb-3(b)(1), unless the authorization is terminated or revoked sooner. Performed at [facility], [address] [place], [place] [address]   Urine culture     Status: Abnormal (Preliminary result)   Collection Time: 09/09/19  5:56 AM   Specimen: Urine, Catheterized  Result Value Ref Range Status   Specimen Description   Final    URINE, CATHETERIZED Performed at  [facility], [address], [place], [place] [address]    Special Requests   Final    NONE Performed at [facility], [address] [place], [place] [address]    Culture (A)  Final    >=100,000 COLONIES/mL ESCHERICHIA COLI CULTURE REINCUBATED FOR BETTER GROWTH Performed at [facility], [place] [address], [place], [place] [address]    Report Status PENDING  Incomplete   Organism ID, Bacteria ESCHERICHIA COLI (A)  Final      Susceptibility   Escherichia coli - MIC*    AMPICILLIN 4 SENSITIVE Sensitive     CEFAZOLIN <=4 SENSITIVE Sensitive     CEFTRIAXONE <=1 SENSITIVE Sensitive     CIPROFLOXACIN 1 SENSITIVE Sensitive     GENTAMICIN <=1 SENSITIVE Sensitive     IMIPENEM <=0.25 SENSITIVE Sensitive     NITROFURANTOIN <=16 SENSITIVE Sensitive     TRIMETH/SULFA <=20 SENSITIVE Sensitive     AMPICILLIN/SULBACTAM <=2 SENSITIVE Sensitive     PIP/TAZO <=4 SENSITIVE Sensitive     Extended ESBL NEGATIVE Sensitive     * >=100,000 COLONIES/mL ESCHERICHIA COLI    Radiology Reports Ct Angio Chest Pe W And/or Wo Contrast  Result Date: 09/09/2019 CLINICAL DATA:  Shortness of breath and fever. Decreased oxygen saturation EXAM: CT ANGIOGRAPHY CHEST WITH CONTRAST TECHNIQUE: Multidetector CT imaging of the chest was performed using the standard protocol during bolus administration of intravenous contrast. Multiplanar CT image reconstructions and MIPs were obtained to evaluate the vascular anatomy. CONTRAST:  176m OMNIPAQUE IOHEXOL 350 MG/ML SOLN COMPARISON:  Chest radiograph September 09, 2019; CT angiogram chest Mar 20, 2018 FINDINGS: Cardiovascular: There is no demonstrable pulmonary embolus. There is no thoracic aortic aneurysm or dissection. There are foci of calcification in the proximal right innominate and proximal left subclavian arteries. Other visualized great vessels appear unremarkable. There are foci aortic  atherosclerosis. There are foci of coronary artery  calcification. Heart is mildly enlarged. There is no pericardial effusion or pericardial thickening. The main pulmonary outflow tract measures 4.0 cm, enlarged. Mediastinum/Nodes: There is a mass in the right lobe of the thyroid measuring 2.3 x 1.5 cm. There are subcentimeter mediastinal and axillary lymph nodes. There is a right pretracheal lymph node measuring 1.3 x 1.0 cm. There is a subcarinal lymph node measuring 1.3 x 1.2 cm. No esophageal lesions are evident. Lungs/Pleura: There is airspace consolidation in both lower lobes, more on the right than on the left, with a minimal amount of pleural effusion on the right. More patchy infiltrate is noted in the right upper lobe. Upper Abdomen: Visualized upper abdominal structures appear unremarkable and stable. Musculoskeletal: There are foci of degenerative change in the thoracic spine. There are no blastic or lytic bone lesions. No evident chest wall lesions. Review of the MIP images confirms the above findings. IMPRESSION: 1. No demonstrable pulmonary embolus. No thoracic aortic aneurysm or dissection. There is aortic atherosclerosis as well as foci of great vessel and coronary artery calcification. 2. Enlargement of the main pulmonary outflow tract, a finding indicative of pulmonary arterial hypertension. 3. Lower lobe airspace consolidation consistent with pneumonia. Minimal right pleural effusion. More patchy infiltrate right upper lobe. 4. Slightly prominent right pretracheal and subcarinal lymph nodes. Etiology for this lymph node prominence is uncertain. Reactive etiology secondary to the parenchymal lung changes is quite possible. 5. **An incidental finding of potential clinical significance has been found. Right lobe thyroid mass measuring 2.3 x 1.5 cm. Advise nonemergent thyroid ultrasound to further assess.** Aortic Atherosclerosis (ICD10-I70.0). Electronically Signed   By: Lowella Grip III M.D.   On: 09/09/2019 16:45   Dg Chest Port 1 View   Result Date: 09/09/2019 CLINICAL DATA:  Short of breath. EXAM: PORTABLE CHEST 1 VIEW COMPARISON:  03/19/2018 FINDINGS: Mild cardiac enlargement. Pulmonary vascular congestion and borderline interstitial edema noted. Decreased lung volumes. No airspace opacifications. IMPRESSION: 1. Suspect mild CHF. Electronically Signed   By: Kerby Moors M.D.   On: 09/09/2019 06:11

## 2019-09-11 NOTE — Evaluation (Signed)
Physical Therapy Evaluation Patient Details Name: Karen Perez MRN: [id number] DOB: 31-Jan-1951 Today's Date: 09/11/2019   History of Present Illness  Karen Perez is a 68 y.o. female with medical history significant of COPD, DM 2, HTN, prior CVA, HLD, chronic diastolic CHF, OSA on CPAP (1L O2 nightly), history of DVTmultiple cerebral aneurysms followed at [facility] (3, all s/p clipping) who presents to [facility] ER from SNF due to shortness of breath and fever. pt. resides in SNF since June. Staff found her O2 sats to be 77% on 2 L.In  the ED, work-up suggested COVID-19 pneumonitis and acute on chronic hypoxic respiratory failure  Clinical Impression  The patient received up in recliner on  2 L [place]. Patient did ambulate a few feet with RW on RA with SPO2 88%, 95% on 2 L. Patient reports mod. Independent with rollator at facility.  Pt admitted with above diagnosis.   Pt currently with functional limitations due to the deficits listed below (see PT Problem List). Pt will benefit from skilled PT to increase their independence and safety with mobility to allow discharge to the venue listed below.        Follow Up Recommendations SNF(retrun to facility?)    Equipment Recommendations  None recommended by PT    Recommendations for Other Services       Precautions / Restrictions Precautions Precautions: Fall Precaution Comments: monitor SOP2      Mobility  Bed Mobility Overal bed mobility: Needs Assistance             General bed mobility comments: received in recliner  Transfers Overall transfer level: Needs assistance Equipment used: Rolling walker (2 wheeled) Transfers: Sit to/from Stand Sit to Stand: Min assist         General transfer comment: Steady assist to rise from recliner,  sextra time to reansition hands to RW. Cues for safety  Ambulation/Gait Ambulation/Gait assistance: Min assist Gait Distance (Feet): 6 Feet Assistive device: Rolling walker (2 wheeled) Gait  Pattern/deviations: Step-to pattern Gait velocity: decr   General Gait Details: forwArd then backward from recliner. Patient had difficulty rolling RW due to increased pressure, Pt. uses rollator PTA  Stairs            Wheelchair Mobility    Modified Rankin (Stroke Patients Only)       Balance Overall balance assessment: Needs assistance Sitting-balance support: No upper extremity supported;Single extremity supported Sitting balance-Leahy Scale: Fair Sitting balance - Comments: in recliner   Standing balance support: Bilateral upper extremity supported;During functional activity Standing balance-Leahy Scale: Poor Standing balance comment: reliant on UE                             Pertinent Vitals/Pain      Home Living Family/patient expects to be discharged to:: Skilled nursing facility                      Prior Function Level of Independence: Needs assistance   Gait / Transfers Assistance Needed: Mod I  in facility with rollator           Hand Dominance        Extremity/Trunk Assessment        Lower Extremity Assessment Lower Extremity Assessment: Generalized weakness    Cervical / Trunk Assessment Cervical / Trunk Assessment: Normal  Communication   Communication: No difficulties  Cognition Arousal/Alertness: Awake/alert Behavior During Therapy: Flat affect Overall Cognitive Status:  Within Functional Limits for tasks assessed                                        General Comments      Exercises General Exercises - Lower Extremity Long Arc Quad: AROM;Both;10 reps;Seated Hip Flexion/Marching: AROM;Both;10 reps;Seated   Assessment/Plan    PT Assessment Patient needs continued PT services  PT Problem List Decreased strength;Decreased mobility;Decreased safety awareness;Decreased activity tolerance;Cardiopulmonary status limiting activity;Decreased knowledge of use of DME       PT Treatment  Interventions DME instruction;Therapeutic activities;Gait training;Therapeutic exercise;Functional mobility training;Patient/family education    PT Goals (Current goals can be found in the Care Plan section)  Acute Rehab PT Goals Patient Stated Goal: to be able to get up and walk PT Goal Formulation: With patient Time For Goal Achievement: 09/25/19 Potential to Achieve Goals: Fair    Frequency Min 2X/week   Barriers to discharge        Co-evaluation               AM-PAC PT "6 Clicks" Mobility  Outcome Measure Help needed turning from your back to your side while in a flat bed without using bedrails?: A Lot Help needed moving from lying on your back to sitting on the side of a flat bed without using bedrails?: A Lot Help needed moving to and from a bed to a chair (including a wheelchair)?: A Lot Help needed standing up from a chair using your arms (e.g., wheelchair or bedside chair)?: A Lot Help needed to walk in hospital room?: A Lot Help needed climbing 3-5 steps with a railing? : Total 6 Click Score: 11    End of Session   Activity Tolerance: Patient limited by fatigue Patient left: in chair;with call bell/phone within reach Nurse Communication: Mobility status PT Visit Diagnosis: Unsteadiness on feet (R26.81);Difficulty in walking, not elsewhere classified (R26.2)    Time: 3382-5053 PT Time Calculation (min) (ACUTE ONLY): 22 min   Charges:   PT Evaluation $PT Eval Low Complexity: 1 Low          Blanchard Kelch PT Acute Rehabilitation Services Pager [phone] Office [phone]   Rada Hay 09/11/2019, 1:24 PM

## 2019-09-11 NOTE — Plan of Care (Signed)

## 2019-09-11 NOTE — Progress Notes (Signed)
Called and spoke with the patient's daughter [name]. I updated her on the plan of care, vitals, oxygen requirement, and medications. She denied any other questions. Encouraged to call back for any concerns.

## 2019-09-12 DIAGNOSIS — J449 Chronic obstructive pulmonary disease, unspecified: Secondary | ICD-10-CM | POA: Diagnosis not present

## 2019-09-12 DIAGNOSIS — I5032 Chronic diastolic (congestive) heart failure: Secondary | ICD-10-CM | POA: Diagnosis not present

## 2019-09-12 DIAGNOSIS — E1169 Type 2 diabetes mellitus with other specified complication: Secondary | ICD-10-CM | POA: Diagnosis not present

## 2019-09-12 DIAGNOSIS — U071 COVID-19: Secondary | ICD-10-CM | POA: Diagnosis not present

## 2019-09-12 LAB — COMPREHENSIVE METABOLIC PANEL
ALT: 24 U/L (ref 0–44)
AST: 30 U/L (ref 15–41)
Albumin: 3.6 g/dL (ref 3.5–5.0)
Alkaline Phosphatase: 55 U/L (ref 38–126)
Anion gap: 11 (ref 5–15)
BUN: 23 mg/dL (ref 8–23)
CO2: 39 mmol/L — ABNORMAL HIGH (ref 22–32)
Calcium: 8.2 mg/dL — ABNORMAL LOW (ref 8.9–10.3)
Chloride: 86 mmol/L — ABNORMAL LOW (ref 98–111)
Creatinine, Ser: 0.73 mg/dL (ref 0.44–1.00)
GFR calc Af Amer: >60 mL/min (ref >60)
GFR calc non Af Amer: >60 mL/min (ref >60)
Glucose, Bld: 117 mg/dL — ABNORMAL HIGH (ref 70–99)
Potassium: 4 mmol/L (ref 3.5–5.1)
Sodium: 136 mmol/L (ref 135–145)
Total Bilirubin: 0.5 mg/dL (ref 0.3–1.2)
Total Protein: 7.3 g/dL (ref 6.5–8.1)

## 2019-09-12 LAB — CBC WITH DIFFERENTIAL/PLATELET
Abs Immature Granulocytes: 0.01 10*3/uL (ref 0.00–0.07)
Basophils Absolute: 0 10*3/uL (ref 0.0–0.1)
Basophils Relative: 0 %
Eosinophils Absolute: 0 10*3/uL (ref 0.0–0.5)
Eosinophils Relative: 0 %
HCT: 47.2 % — ABNORMAL HIGH (ref 36.0–46.0)
Hemoglobin: 13.5 g/dL (ref 12.0–15.0)
Immature Granulocytes: 0 %
Lymphocytes Relative: 13 %
Lymphs Abs: 0.6 10*3/uL — ABNORMAL LOW (ref 0.7–4.0)
MCH: 27.8 pg (ref 26.0–34.0)
MCHC: 28.6 g/dL — ABNORMAL LOW (ref 30.0–36.0)
MCV: 97.1 fL (ref 80.0–100.0)
Monocytes Absolute: 0.5 10*3/uL (ref 0.1–1.0)
Monocytes Relative: 10 %
Neutro Abs: 3.9 10*3/uL (ref 1.7–7.7)
Neutrophils Relative %: 77 %
Platelets: 196 10*3/uL (ref 150–400)
RBC: 4.86 MIL/uL (ref 3.87–5.11)
RDW: 15.2 % (ref 11.5–15.5)
WBC: 5 10*3/uL (ref 4.0–10.5)
nRBC: 0 % (ref 0.0–0.2)

## 2019-09-12 LAB — D-DIMER, QUANTITATIVE: D-Dimer, Quant: 1.39 ug/mL-FEU — ABNORMAL HIGH (ref 0.00–0.50)

## 2019-09-12 LAB — GLUCOSE, CAPILLARY
Glucose-Capillary: 116 mg/dL — ABNORMAL HIGH (ref 70–99)
Glucose-Capillary: 160 mg/dL — ABNORMAL HIGH (ref 70–99)
Glucose-Capillary: 262 mg/dL — ABNORMAL HIGH (ref 70–99)

## 2019-09-12 LAB — C-REACTIVE PROTEIN: CRP: 0.8 mg/dL (ref <1.0)

## 2019-09-12 LAB — BRAIN NATRIURETIC PEPTIDE: B Natriuretic Peptide: 21.8 pg/mL (ref 0.0–100.0)

## 2019-09-12 LAB — MAGNESIUM: Magnesium: 2.3 mg/dL (ref 1.7–2.4)

## 2019-09-12 MED ORDER — METHYLPREDNISOLONE SODIUM SUCC 40 MG IJ SOLR
40.0000 mg | Freq: Every day | INTRAMUSCULAR | Status: DC
  Administered 2019-09-13 – 2019-09-14 (×2): 40 mg via INTRAVENOUS
  Filled 2019-09-12 (×2): qty 1

## 2019-09-12 MED ORDER — DIPHENHYDRAMINE HCL 50 MG/ML IJ SOLN: 12.5000 mg | Freq: Three times a day (TID) | INTRAMUSCULAR | Status: DC | PRN

## 2019-09-12 MED ORDER — SODIUM CHLORIDE 0.9 % IV SOLN
1.0000 g | INTRAVENOUS | Status: DC
  Administered 2019-09-12 – 2019-09-13 (×2): 1 g via INTRAVENOUS
  Filled 2019-09-12 (×2): qty 10

## 2019-09-12 NOTE — Progress Notes (Signed)
Called and spoke with the patient's daughter [name]. I updated her on the plan of care, oxygen requirements, vitals, and medications. She denied any other questions at this time.

## 2019-09-12 NOTE — Plan of Care (Signed)

## 2019-09-12 NOTE — Progress Notes (Addendum)
                                  PROGRESS NOTE                                                                                                                                                                                                             Patient Demographics:    Karen Perez, is a 68 y.o. female, DOB - 02/16/1951, MRN:[id number]  Outpatient Primary MD for the patient is Khan, Fozia M, MD    LOS - 3  Admit date - 09/09/2019    CC - SOB     Brief Narrative  Karen Perez is a 68 y.o. female with medical history significant of COPD, DM 2, HTN, prior CVA, HLD, chronic diastolic CHF, OSA on CPAP (1L O2 nightly), history of DVT not on AC currently, prior multiple cerebral aneurysms followed at [facility] (3, all s/p clipping) who presents to [place] ER from SNF due to shortness of breath and fever.  Staff found her O2 sats to be 77% on 2 L and she required 4 to 6 L on admission to the ED, work-up suggested COVID-19 pneumonitis and acute on chronic hypoxic respiratory failure and she was admitted to the hospital.   Subjective:   Patient in bed, appears comfortable, denies any headache, no fever, no chest pain or pressure, no shortness of breath , no abdominal pain. No focal weakness.    Assessment  & Plan :     1. Acute Hypoxic Resp. Failure due to Acute Covid 19 Viral Pneumonitis during the ongoing 2020 Covid 19 Pandemic - she has been adequately started on IV steroids along with remdesivir, clinically she has stabilized with stable inflammatory markers, was able to sit her in the chair and made her use incentive spirometry for atelectasis.  She is now requiring 2 L nasal cannula oxygen while I was in the room with a pulse ox in mid low 90s.  She is in no respiratory distress.  Continue to monitor closely and start tapering steroids.  She has been encouraged to sit up in chair and daytime use flutter valve and I-S for pulmonary toiletry and then prone when  in bed.     COVID-19 Labs  Recent Labs    09/10/19 0209 09/11/19 0424 09/12/19 0055  DDIMER 1.50* 1.58* 1.39*  CRP 3.1* 1.2* 0.8    Lab Results  Component Value Date   SARSCOV2NAA   POSITIVE (A) 09/09/2019     SpO2: (!) 89 % O2 Flow Rate (L/min): 2 L/min FiO2 (%): (!) 4 %  Hepatic Function Latest Ref Rng & Units 09/12/2019 09/11/2019 09/10/2019  Total Protein 6.5 - 8.1 g/dL 7.3 7.1 7.3  Albumin 3.5 - 5.0 g/dL 3.6 3.3(L) 3.7  AST 15 - 41 U/L 30 17 18  ALT 0 - 44 U/L 24 16 16  Alk Phosphatase 38 - 126 U/L 55 51 59  Total Bilirubin 0.3 - 1.2 mg/dL 0.5 0.4 0.5  Bilirubin, Direct 0.0 - 0.3 mg/dL - - -        Component Value Date/Time   BNP 21.8 09/12/2019 0055     2.  History of COPD.  Follows with Dr. Sood pulmonary.  No acute issues.  3. OSA.  Oxygen here at night.  Uses CPAP with 1 L oxygen bleed at night at SNF.  4.  Essential hypertension.  Blood pressure is stable will monitor.  5.  Acute on chronic diastolic CHF.  65% on echocardiogram done in April 2019. Post 4 doses of IV lasix.  Will monitor and adjust as needed.  Still has few Rales.  6.  GERD.  PPI.  7. HX Of CVA.  On Plavix and statin for secondary prevention.  PT OT here.  8.  Dyslipidemia.  Continue statin.  9.  Obesity.  BMI 48.  Follow with PCP for weight loss.  10.  Incidental thyroid mass.  PCP to monitor and do age-appropriate outpatient work-up.  11. E. coli UTI - Rocephin x 3 days.  12.  DM type II.  Currently on sliding scale since she is getting steroids will add Lantus.  Monitor and adjust.  Lab Results  Component Value Date   HGBA1C 6.6 (H) 09/10/2019   CBG (last 3)  Recent Labs    09/11/19 1625 09/11/19 2105 09/12/19 0806  GLUCAP 235* 196* 116*     Condition -  Fair  Family Communication  : Called the listed cell phone #4 daughter Karen Perez on 09/10/2019 at 9:20 AM.  No response > 8 rings, called and left message 10/27  @ 9.10 am.  Code Status :  Full  Diet :     Diet Order            Diet heart healthy/carb modified Room service appropriate? Yes; Fluid consistency: Thin  Diet effective now               Disposition Plan  :  Home once Remdesivir course is done  Consults  :  None  Procedures  :   CTA - 1. No demonstrable pulmonary embolus. No thoracic aortic aneurysm or dissection. There is aortic atherosclerosis as well as foci of great vessel and coronary artery calcification. 2. Enlargement of the main pulmonary outflow tract, a finding indicative of pulmonary arterial hypertension. 3. Lower lobe airspace consolidation consistent with pneumonia. Minimal right pleural effusion. More patchy infiltrate right upper lobe. 4. Slightly prominent right pretracheal and subcarinal lymph nodes. Etiology for this lymph node prominence is uncertain. Reactive etiology secondary to the parenchymal lung changes is quite possible. 5. **An incidental finding of potential clinical significance has been found. Right lobe thyroid mass measuring 2.3 x 1.5 cm. Advise nonemergent thyroid ultrasound to further assess.** Aortic Atherosclerosis.   PUD Prophylaxis :  PPI  DVT Prophylaxis  :  Lovenox   Lab Results  Component Value Date   PLT 196 09/12/2019    Inpatient Medications  Scheduled   Meds: . albuterol  1 puff Inhalation Q6H  . atorvastatin  20 mg Oral q1800  . clopidogrel  75 mg Oral Daily  . darifenacin  7.5 mg Oral Daily  . enoxaparin (LOVENOX) injection  65 mg Subcutaneous Q24H  . fluticasone  1 spray Each Nare Daily  . gabapentin  300 mg Oral 2 times per day  . insulin aspart  0-15 Units Subcutaneous TID WC  . insulin aspart  0-5 Units Subcutaneous QHS  . insulin glargine  12 Units Subcutaneous Daily  . methylPREDNISolone (SOLU-MEDROL) injection  40 mg Intravenous Q12H  . mirabegron ER  25 mg Oral Daily  . mometasone-formoterol  2 puff Inhalation BID  . pantoprazole  20 mg Oral Daily  . traZODone  25 mg Oral Q2000  . umeclidinium bromide  1 puff  Inhalation Daily   Continuous Infusions: . remdesivir 100 mg in NS 250 mL Stopped (09/11/19 1717)   PRN Meds:.acetaminophen, cyclobenzaprine, guaiFENesin-dextromethorphan, [DISCONTINUED] ondansetron **OR** ondansetron (ZOFRAN) IV, senna-docusate, traMADol  Antibiotics  :    Anti-infectives (From admission, onward)   Start     Dose/Rate Route Frequency Ordered Stop   09/10/19 1600  remdesivir 100 mg in sodium chloride 0.9 % 250 mL IVPB     100 mg 500 mL/hr over 30 Minutes Intravenous Every 24 hours 09/09/19 1731 09/14/19 1559   09/09/19 1830  remdesivir 200 mg in sodium chloride 0.9 % 250 mL IVPB     200 mg 500 mL/hr over 30 Minutes Intravenous Once 09/09/19 1731 09/09/19 1908       Time Spent in minutes  30   Lala Lund M.D on 09/12/2019 at 9:11 AM  To page go to [email or web address] - password [facility]  [facility] -  Office  [phone]  See all Orders from today for further details    Objective:   Vitals:   09/12/19 0000 09/12/19 0338 09/12/19 0454 09/12/19 0700  BP:  135/67  117/69  Pulse:      Resp: _0 Temp: 98.1 F (36.7 C) 97.7 F (36.5 C)  98.2 F (36.8 C)  TempSrc: Oral Axillary  Oral  SpO2: 93%   (!) 89%  Weight:   132 kg   Height:        Wt Readings from Last 3 Encounters:  09/12/19 132 kg  09/09/19 134.7 kg  07/26/19 132.5 kg     Intake/Output Summary (Last 24 hours) at 09/12/2019 0911 Last data filed at 09/12/2019 0300 Gross per 24 hour  Intake 730 ml  Output 1000 ml  Net -270 ml     Physical Exam  Awake Alert,  No new F.N deficits, Normal affect [place].AT,PERRAL Supple Neck,No JVD, No cervical lymphadenopathy appriciated.  Symmetrical Chest wall movement, Good air movement bilaterally, CTAB RRR,No Gallops, Rubs or new Murmurs, No Parasternal Heave +ve B.Sounds, Abd Soft, No tenderness, No organomegaly appriciated, No rebound - guarding or rigidity. No Cyanosis, Clubbing or edema, No new Rash or bruise    Data Review:     CBC Recent Labs  Lab 09/09/19 0524 09/10/19 0209 09/11/19 0424 09/12/19 0055  WBC 5.4 4.0 3.0* 5.0  HGB 12.1 12.9 12.9 13.5  HCT 42.7 46.4* 45.3 47.2*  PLT 193 186 195 196  MCV 97.7 100.2* 97.8 97.1  MCH 27.7 27.9 27.9 27.8  MCHC 28.3* 27.8* 28.5* 28.6*  RDW 15.6* 15.6* 15.3 15.2  LYMPHSABS 0.6* 0.3* 0.5* 0.6*  MONOABS 0.7 0.2 0.3 0.5  EOSABS 0.0 0.0 0.0 0.0  BASOSABS  0.0 0.0 0.0 0.0    Chemistries  Recent Labs  Lab 09/09/19 0603 09/10/19 0209 09/11/19 0424 09/12/19 0055  NA 140 139 139 136  K 4.1 4.3 4.0 4.0  CL 94* 87* 90* 86*  CO2 38* 41* 39* 39*  GLUCOSE 105* 127* 165* 117*  BUN _0 CREATININE 0.74 0.82 0.72 0.73  CALCIUM 8.6* 8.6* 8.6* 8.2*  MG  --  2.2 2.1 2.3  AST _1 ALT _2 ALKPHOS 53 59 51 55  BILITOT 0.3 0.5 0.4 0.5   ------------------------------------------------------------------------------------------------------------------ No results for input(s): CHOL, HDL, LDLCALC, TRIG, CHOLHDL, LDLDIRECT in the last 72 hours.  Lab Results  Component Value Date   HGBA1C 6.6 (H) 09/10/2019   ------------------------------------------------------------------------------------------------------------------ No results for input(s): TSH, T4TOTAL, T3FREE, THYROIDAB in the last 72 hours.  Invalid input(s): FREET3  Cardiac Enzymes No results for input(s): CKMB, TROPONINI, MYOGLOBIN in the last 168 hours.  Invalid input(s): CK ------------------------------------------------------------------------------------------------------------------    Component Value Date/Time   BNP 21.8 09/12/2019 0055    Micro Results Recent Results (from the past 240 hour(s))  Culture, blood (routine x 2)     Status: None (Preliminary result)   Collection Time: 09/09/19  5:20 AM   Specimen: BLOOD  Result Value Ref Range Status   Specimen Description BLOOD LEFT ANTECUBITAL  Final   Special Requests   Final    BOTTLES DRAWN AEROBIC AND ANAEROBIC  Blood Culture results may not be optimal due to an excessive volume of blood received in culture bottles   Culture   Final    NO GROWTH 3 DAYS Performed at [facility], [address], [place], [place] [address]    Report Status PENDING  Incomplete  Culture, blood (routine x 2)     Status: None (Preliminary result)   Collection Time: 09/09/19  5:20 AM   Specimen: BLOOD  Result Value Ref Range Status   Specimen Description BLOOD LEFT WRIST  Final   Special Requests   Final    BOTTLES DRAWN AEROBIC AND ANAEROBIC Blood Culture adequate volume   Culture   Final    NO GROWTH 3 DAYS Performed at [facility], [address] [place], [place] [address]    Report Status PENDING  Incomplete  SARS Coronavirus 2 by RT PCR (hospital order, performed in [place] hospital lab) Nasopharyngeal Nasopharyngeal Swab     Status: Abnormal   Collection Time: 09/09/19  5:24 AM   Specimen: Nasopharyngeal Swab  Result Value Ref Range Status   SARS Coronavirus 2 POSITIVE (A) NEGATIVE Final    Comment: RESULT CALLED TO, READ BACK BY AND VERIFIED WITH: BRANDY DAVIS 09/09/2019 0718 [facility] (NOTE) If result is NEGATIVE SARS-CoV-2 target nucleic acids are NOT DETECTED. The SARS-CoV-2 RNA is generally detectable in upper and lower  respiratory specimens during the acute phase of infection. The lowest  concentration of SARS-CoV-2 viral copies this assay can detect is 250  copies / mL. A negative result does not preclude SARS-CoV-2 infection  and should not be used as the sole basis for treatment or other  patient management decisions.  A negative result may occur with  improper specimen collection / handling, submission of specimen other  than nasopharyngeal swab, presence of viral mutation(s) within the  areas targeted by this assay, and inadequate number of viral copies  (<250 copies / mL). A negative result must be combined with clinical  observations, patient history, and epidemiological  information. If result is POSITIVE SARS-CoV-2 target nucleic acids are DETECTED. The  SARS-CoV-2 RNA is generally detectable in upper and lower  respiratory specimens during the acute phase of infection.  Positive  results are indicative of active infection with SARS-CoV-2.  Clinical  correlation with patient history and other diagnostic information is  necessary to determine patient infection status.  Positive results do  not rule out bacterial infection or co-infection with other viruses. If result is PRESUMPTIVE POSTIVE SARS-CoV-2 nucleic acids MAY BE PRESENT.   A presumptive positive result was obtained on the submitted specimen  and confirmed on repeat testing.  While 2019 novel coronavirus  (SARS-CoV-2) nucleic acids may be present in the submitted sample  additional confirmatory testing may be necessary for epidemiological  and / or clinical management purposes  to differentiate between  SARS-CoV-2 and other Sarbecovirus currently known to infect humans.  If clinically indicated additional testing with an alternate test  methodology [phone]) is ad vised. The SARS-CoV-2 RNA is generally  detectable in upper and lower respiratory specimens during the acute  phase of infection. The expected result is Negative. Fact Sheet for Patients:  StrictlyIdeas.no Fact Sheet for Healthcare Providers: BankingDealers.co.za This test is not yet approved or cleared by the [place] FDA and has been authorized for detection and/or diagnosis of SARS-CoV-2 by FDA under an Emergency Use Authorization (EUA).  This EUA will remain in effect (meaning this test can be used) for the duration of the COVID-19 declaration under Section 564(b)(1) of the Act, 21 U.S.C. section 360bbb-3(b)(1), unless the authorization is terminated or revoked sooner. Performed at [facility], [address] [place], [place] [address]   Urine culture     Status:  Abnormal (Preliminary result)   Collection Time: 09/09/19  5:56 AM   Specimen: Urine, Catheterized  Result Value Ref Range Status   Specimen Description   Final    URINE, CATHETERIZED Performed at [facility], [address], [place], [place] [address]    Special Requests   Final    NONE Performed at [facility], [address] [place], [place] [address]    Culture (A)  Final    >=100,000 COLONIES/mL ESCHERICHIA COLI CULTURE REINCUBATED FOR BETTER GROWTH Performed at [facility], [place] [address] [place], [place] [address]    Report Status PENDING  Incomplete   Organism ID, Bacteria ESCHERICHIA COLI (A)  Final      Susceptibility   Escherichia coli - MIC*    AMPICILLIN 4 SENSITIVE Sensitive     CEFAZOLIN <=4 SENSITIVE Sensitive     CEFTRIAXONE <=1 SENSITIVE Sensitive     CIPROFLOXACIN 1 SENSITIVE Sensitive     GENTAMICIN <=1 SENSITIVE Sensitive     IMIPENEM <=0.25 SENSITIVE Sensitive     NITROFURANTOIN <=16 SENSITIVE Sensitive     TRIMETH/SULFA <=20 SENSITIVE Sensitive     AMPICILLIN/SULBACTAM <=2 SENSITIVE Sensitive     PIP/TAZO <=4 SENSITIVE Sensitive     Extended ESBL NEGATIVE Sensitive     * >=100,000 COLONIES/mL ESCHERICHIA COLI    Radiology Reports Ct Angio Chest Pe W And/or Wo Contrast  Result Date: 09/09/2019 CLINICAL DATA:  Shortness of breath and fever. Decreased oxygen saturation EXAM: CT ANGIOGRAPHY CHEST WITH CONTRAST TECHNIQUE: Multidetector CT imaging of the chest was performed using the standard protocol during bolus administration of intravenous contrast. Multiplanar CT image reconstructions and MIPs were obtained to evaluate the vascular anatomy. CONTRAST:  134m OMNIPAQUE IOHEXOL 350 MG/ML SOLN COMPARISON:  Chest  radiograph September 09, 2019; CT angiogram chest Mar 20, 2018 FINDINGS: Cardiovascular: There is no demonstrable pulmonary embolus. There is no thoracic aortic aneurysm or dissection. There are foci of calcification in the  proximal right innominate and proximal left subclavian arteries. Other visualized great vessels appear unremarkable. There are foci aortic atherosclerosis. There are foci of coronary artery calcification. Heart is mildly enlarged. There is no pericardial effusion or pericardial thickening. The main pulmonary outflow tract measures 4.0 cm, enlarged. Mediastinum/Nodes: There is a mass in the right lobe of the thyroid measuring 2.3 x 1.5 cm. There are subcentimeter mediastinal and axillary lymph nodes. There is a right pretracheal lymph node measuring 1.3 x 1.0 cm. There is a subcarinal lymph node measuring 1.3 x 1.2 cm. No esophageal lesions are evident. Lungs/Pleura: There is airspace consolidation in both lower lobes, more on the right than on the left, with a minimal amount of pleural effusion on the right. More patchy infiltrate is noted in the right upper lobe. Upper Abdomen: Visualized upper abdominal structures appear unremarkable and stable. Musculoskeletal: There are foci of degenerative change in the thoracic spine. There are no blastic or lytic bone lesions. No evident chest wall lesions. Review of the MIP images confirms the above findings. IMPRESSION: 1. No demonstrable pulmonary embolus. No thoracic aortic aneurysm or dissection. There is aortic atherosclerosis as well as foci of great vessel and coronary artery calcification. 2. Enlargement of the main pulmonary outflow tract, a finding indicative of pulmonary arterial hypertension. 3. Lower lobe airspace consolidation consistent with pneumonia. Minimal right pleural effusion. More patchy infiltrate right upper lobe. 4. Slightly prominent right pretracheal and subcarinal lymph nodes. Etiology for this lymph node prominence is uncertain. Reactive etiology secondary to the parenchymal lung changes is quite possible. 5. **An incidental finding of potential clinical significance has been found. Right lobe thyroid mass measuring 2.3 x 1.5 cm. Advise  nonemergent thyroid ultrasound to further assess.** Aortic Atherosclerosis (ICD10-I70.0). Electronically Signed   By: William  Woodruff III M.D.   On: 09/09/2019 16:45   Dg Chest Port 1 View  Result Date: 09/09/2019 CLINICAL DATA:  Short of breath. EXAM: PORTABLE CHEST 1 VIEW COMPARISON:  03/19/2018 FINDINGS: Mild cardiac enlargement. Pulmonary vascular congestion and borderline interstitial edema noted. Decreased lung volumes. No airspace opacifications. IMPRESSION: 1. Suspect mild CHF. Electronically Signed   By: Taylor  Stroud M.D.   On: 09/09/2019 06:11     

## 2019-09-12 NOTE — TOC Initial Note (Signed)
Transition of Care [facility] - Initial/Assessment Note    Patient Details  Name: Karen Perez MRN: [id number] Date of Birth: Mar 29, 1951  Transition of Care [facility] CM/SW Contact:    Ninfa Meeker, RN Phone Number: 09/12/2019, 3:38 PM  Clinical Narrative:  Patient is a 68 y.o. female with medical history significant of COPD, DM 2, HTN, prior CVA, HLD, chronic diastolic CHF, OSA on CPAP (1L O2 nightly), history of DVTmultiple cerebral aneurysms followed at [facility] (3, all s/p clipping) who presents to [place] ER from [facility]  due to shortness of breath and fever. pt. resides in SNF since June.  Patient on oxygen, Remdesivir, IV solumedrol. Case manager will continue to monitor for needs.                      Patient Goals and CMS Choice        Expected Discharge Plan and Services                                               Prior Living Arrangements/Services                       Activities of Daily Living Home Assistive Devices/Equipment: Wheelchair, Environmental consultant (specify type) ADL Screening (condition at time of admission) Patient's cognitive ability adequate to safely complete daily activities?: No Is the patient deaf or have difficulty hearing?: No Does the patient have difficulty seeing, even when wearing glasses/contacts?: No Does the patient have difficulty concentrating, remembering, or making decisions?: Yes Patient able to express need for assistance with ADLs?: Yes Does the patient have difficulty dressing or bathing?: Yes Independently performs ADLs?: No Communication: Independent Dressing (OT): Needs assistance Is this a change from baseline?: Pre-admission baseline Grooming: Needs assistance Is this a change from baseline?: Pre-admission baseline Feeding: Independent Bathing: Needs assistance Is this a change from baseline?: Pre-admission baseline Toileting: Dependent Is this a change from baseline?: Pre-admission baseline In/Out  Bed: Dependent Is this a change from baseline?: Pre-admission baseline Walks in Home: Dependent Is this a change from baseline?: Pre-admission baseline Does the patient have difficulty walking or climbing stairs?: Yes Weakness of Legs: Both Weakness of Arms/Hands: None  Permission Sought/Granted                  Emotional Assessment              Admission diagnosis:  COVID-19 Patient Active Problem List   Diagnosis Date Noted  . Acute hypoxemic respiratory failure due to COVID-19 ([place]) 09/09/2019  . Chronic diastolic heart failure ([place]) 07/08/2018  . Abnormality of gait 07/05/2018  . Acute respiratory failure with hypoxia ([place]) 02/22/2018  . COPD with acute exacerbation ([place]) 02/22/2018  . Diabetes mellitus type 2 in obese ([place]) 02/22/2018  . Acute respiratory failure with hypoxia and hypercapnia ([place]) 02/22/2018  . HOT FLASHES 05/02/2010  . DIABETES MELLITUS, TYPE II 01/24/2010  . SKIN TAG 10/29/2009  . KNEE PAIN, BILATERAL 10/04/2009  . CHRONIC RHINITIS 08/01/2009  . VOCAL CORD DISORDER 08/01/2009  . GERD 08/01/2009  . HYPOXEMIA 05/21/2009  . Nonspecific (abnormal) findings on radiological and other examination of body structure 05/16/2009  . ABNORMAL CHEST XRAY 05/16/2009  . OBESITY, MORBID 04/30/2009  . TOBACCO USER 04/30/2009  . TINEA CORPORIS 04/19/2009  . HEMOCCULT POSITIVE STOOL 02/07/2009  .  LEG CRAMPS 12/24/2008  . DERMATITIS, ALLERGIC 06/28/2008  . BACK PAIN, LUMBAR 05/04/2008  . OBSTRUCTIVE SLEEP APNEA 10/17/2007  . Essential hypertension 04/20/2007  . HYPERLIPIDEMIA 03/21/2007  . DEPRESSION 03/21/2007  . CEREBRAL ANEURYSM 03/21/2007  . COPD (chronic obstructive pulmonary disease) (HCC) 03/21/2007  . ARTHRITIS 03/21/2007  . DVT, HX OF 03/21/2007   PCP:  Lyndon Code, MD Pharmacy:   [facility] [address] [place] - [address] [place] [address] Phone: [phone] Fax: [phone]  [facility] -  [place], [place] - [address] [place] [address] Phone: [phone] Fax: [phone]     Social Determinants of Health (SDOH) Interventions    Readmission Risk Interventions No flowsheet data found.

## 2019-09-13 DIAGNOSIS — J449 Chronic obstructive pulmonary disease, unspecified: Secondary | ICD-10-CM | POA: Diagnosis not present

## 2019-09-13 DIAGNOSIS — E1169 Type 2 diabetes mellitus with other specified complication: Secondary | ICD-10-CM | POA: Diagnosis not present

## 2019-09-13 DIAGNOSIS — U071 COVID-19: Secondary | ICD-10-CM | POA: Diagnosis not present

## 2019-09-13 DIAGNOSIS — I5032 Chronic diastolic (congestive) heart failure: Secondary | ICD-10-CM | POA: Diagnosis not present

## 2019-09-13 LAB — CBC WITH DIFFERENTIAL/PLATELET
Abs Immature Granulocytes: 0.01 10*3/uL (ref 0.00–0.07)
Basophils Absolute: 0 10*3/uL (ref 0.0–0.1)
Basophils Relative: 0 %
Eosinophils Absolute: 0 10*3/uL (ref 0.0–0.5)
Eosinophils Relative: 0 %
HCT: 46.1 % — ABNORMAL HIGH (ref 36.0–46.0)
Hemoglobin: 13.1 g/dL (ref 12.0–15.0)
Immature Granulocytes: 0 %
Lymphocytes Relative: 24 %
Lymphs Abs: 1.2 10*3/uL (ref 0.7–4.0)
MCH: 27.9 pg (ref 26.0–34.0)
MCHC: 28.4 g/dL — ABNORMAL LOW (ref 30.0–36.0)
MCV: 98.3 fL (ref 80.0–100.0)
Monocytes Absolute: 0.8 10*3/uL (ref 0.1–1.0)
Monocytes Relative: 15 %
Neutro Abs: 3.1 10*3/uL (ref 1.7–7.7)
Neutrophils Relative %: 61 %
Platelets: 207 10*3/uL (ref 150–400)
RBC: 4.69 MIL/uL (ref 3.87–5.11)
RDW: 15.3 % (ref 11.5–15.5)
WBC: 5.1 10*3/uL (ref 4.0–10.5)
nRBC: 0 % (ref 0.0–0.2)

## 2019-09-13 LAB — COMPREHENSIVE METABOLIC PANEL
ALT: 24 U/L (ref 0–44)
AST: 23 U/L (ref 15–41)
Albumin: 3.4 g/dL — ABNORMAL LOW (ref 3.5–5.0)
Alkaline Phosphatase: 52 U/L (ref 38–126)
Anion gap: 11 (ref 5–15)
BUN: 22 mg/dL (ref 8–23)
CO2: 35 mmol/L — ABNORMAL HIGH (ref 22–32)
Calcium: 8.6 mg/dL — ABNORMAL LOW (ref 8.9–10.3)
Chloride: 94 mmol/L — ABNORMAL LOW (ref 98–111)
Creatinine, Ser: 0.68 mg/dL (ref 0.44–1.00)
GFR calc Af Amer: >60 mL/min (ref >60)
GFR calc non Af Amer: >60 mL/min (ref >60)
Glucose, Bld: 135 mg/dL — ABNORMAL HIGH (ref 70–99)
Potassium: 3.9 mmol/L (ref 3.5–5.1)
Sodium: 140 mmol/L (ref 135–145)
Total Bilirubin: 0.3 mg/dL (ref 0.3–1.2)
Total Protein: 7.1 g/dL (ref 6.5–8.1)

## 2019-09-13 LAB — BRAIN NATRIURETIC PEPTIDE: B Natriuretic Peptide: 28 pg/mL (ref 0.0–100.0)

## 2019-09-13 LAB — URINE CULTURE: Culture: >=100000 — AB

## 2019-09-13 LAB — GLUCOSE, CAPILLARY
Glucose-Capillary: 98 mg/dL (ref 70–99)
Glucose-Capillary: 150 mg/dL — ABNORMAL HIGH (ref 70–99)
Glucose-Capillary: 252 mg/dL — ABNORMAL HIGH (ref 70–99)
Glucose-Capillary: 217 mg/dL — ABNORMAL HIGH (ref 70–99)

## 2019-09-13 NOTE — [place] FL2 (Signed)
[place] MEDICAID FL2 LEVEL OF CARE SCREENING TOOL     IDENTIFICATION  Patient Name: Karen Perez Birthdate: 10/15/1951 Sex: female Admission Date (Current Location): 09/09/2019  [facility] [place]   Facility and Address:  [facility], [address], [place], [place] [address]([phone]([address]      Provider Number: [id number]  Attending Physician Name and Address:  Leroy SeaSingh, Prashant K, MD  Relative Name and Phone Number:  daughter    Karen Perez - [phone]    Current Level of Care: Hospital Recommended Level of Care: Skilled Nursing Facility Prior Approval Number:    Date Approved/Denied:   PASRR Number: [id number]  Discharge Plan: SNF    Current Diagnoses: Patient Active Problem List   Diagnosis Date Noted  . Acute hypoxemic respiratory failure due to COVID-19 (HCC) 09/09/2019  . Chronic diastolic heart failure (HCC) 07/08/2018  . Abnormality of gait 07/05/2018  . Acute respiratory failure with hypoxia (HCC) 02/22/2018  . COPD with acute exacerbation (HCC) 02/22/2018  . Diabetes mellitus type 2 in obese (HCC) 02/22/2018  . Acute respiratory failure with hypoxia and hypercapnia (HCC) 02/22/2018  . HOT FLASHES 05/02/2010  . DIABETES MELLITUS, TYPE II 01/24/2010  . SKIN TAG 10/29/2009  . KNEE PAIN, BILATERAL 10/04/2009  . CHRONIC RHINITIS 08/01/2009  . VOCAL CORD DISORDER 08/01/2009  . GERD 08/01/2009  . HYPOXEMIA 05/21/2009  . Nonspecific (abnormal) findings on radiological and other examination of body structure 05/16/2009  . ABNORMAL CHEST XRAY 05/16/2009  . OBESITY, MORBID 04/30/2009  . TOBACCO USER 04/30/2009  . TINEA CORPORIS 04/19/2009  . HEMOCCULT POSITIVE STOOL 02/07/2009  . LEG CRAMPS 12/24/2008  . DERMATITIS, ALLERGIC 06/28/2008  . BACK PAIN, LUMBAR 05/04/2008  . OBSTRUCTIVE SLEEP APNEA 10/17/2007  . Essential hypertension 04/20/2007  . HYPERLIPIDEMIA 03/21/2007  . DEPRESSION 03/21/2007  .  CEREBRAL ANEURYSM 03/21/2007  . COPD (chronic obstructive pulmonary disease) (HCC) 03/21/2007  . ARTHRITIS 03/21/2007  . DVT, HX OF 03/21/2007    Orientation RESPIRATION BLADDER Height & Weight     Time, Situation, Place  O2(2L [place]) Incontinent Weight: 131.8 kg Height:  5\' 5"  (165.1 cm)  BEHAVIORAL SYMPTOMS/MOOD NEUROLOGICAL BOWEL NUTRITION STATUS      Continent Diet  AMBULATORY STATUS COMMUNICATION OF NEEDS Skin   Limited Assist Verbally Normal                       Personal Care Assistance Level of Assistance  Bathing, Dressing Bathing Assistance: Maximum assistance   Dressing Assistance: Limited assistance     Functional Limitations Info             SPECIAL CARE FACTORS FREQUENCY  PT (By licensed PT), OT (By licensed OT)     PT Frequency: 5x/week OT Frequency: min 3x/week            Contractures Contractures Info: Not present    Additional Factors Info  Code Status Code Status Info: Full Allergies Info: Amoxicillin, Bacitracin, Neosporin     Isolation Precautions Info: COVID positive, Airborne.isolation     Current Medications (09/13/2019):  This is the current hospital active medication list Current Facility-Administered Medications  Medication Dose Route Frequency Provider Last Rate Last Dose  . acetaminophen (TYLENOL) tablet 650 mg  650 mg Oral Q6H PRN Leatha GildingGherghe, Costin M, MD   650 mg at 09/09/19 2130  . albuterol (VENTOLIN HFA) 108 (90 Base) MCG/ACT inhaler 1 puff  1 puff Inhalation Q6H Gherghe, Costin  M, MD   1 puff at 09/13/19 1429  . atorvastatin (LIPITOR) tablet 20 mg  20 mg Oral q1800 Caren Griffins, MD   20 mg at 09/12/19 1708  . cefTRIAXone (ROCEPHIN) 1 g in sodium chloride 0.9 % 100 mL IVPB  1 g Intravenous Q24H Thurnell Lose, MD   Stopped at 09/13/19 1004  . clopidogrel (PLAVIX) tablet 75 mg  75 mg Oral Daily Caren Griffins, MD   75 mg at 09/13/19 0086  . cyclobenzaprine (FLEXERIL) tablet 5 mg  5 mg Oral TID PRN Caren Griffins, MD      . darifenacin (ENABLEX) 24 hr tablet 7.5 mg  7.5 mg Oral Daily Caren Griffins, MD   7.5 mg at 09/13/19 7619  . diphenhydrAMINE (BENADRYL) injection 12.5 mg  12.5 mg Intravenous Q8H PRN Thurnell Lose, MD      . enoxaparin (LOVENOX) injection 65 mg  65 mg Subcutaneous Q24H Caren Griffins, MD   65 mg at 09/12/19 2132  . fluticasone (FLONASE) 50 MCG/ACT nasal spray 1 spray  1 spray Each Nare Daily Caren Griffins, MD   1 spray at 09/13/19 0939  . gabapentin (NEURONTIN) capsule 300 mg  300 mg Oral 2 times per day Caren Griffins, MD   300 mg at 09/13/19 1429  . guaiFENesin-dextromethorphan (ROBITUSSIN DM) 100-10 MG/5ML syrup 10 mL  10 mL Oral Q4H PRN Caren Griffins, MD   10 mL at 09/12/19 2132  . insulin aspart (novoLOG) injection 0-15 Units  0-15 Units Subcutaneous TID WC Caren Griffins, MD   2 Units at 09/13/19 1241  . insulin aspart (novoLOG) injection 0-5 Units  0-5 Units Subcutaneous QHS Caren Griffins, MD   2 Units at 09/09/19 2130  . insulin glargine (LANTUS) injection 12 Units  12 Units Subcutaneous Daily Thurnell Lose, MD   12 Units at 09/13/19 [phone]  . methylPREDNISolone sodium succinate (SOLU-MEDROL) 40 mg/mL injection 40 mg  40 mg Intravenous Daily Thurnell Lose, MD   40 mg at 09/13/19 0937  . mirabegron ER (MYRBETRIQ) tablet 25 mg  25 mg Oral Daily Caren Griffins, MD   25 mg at 09/13/19 2671  . mometasone-formoterol (DULERA) 200-5 MCG/ACT inhaler 2 puff  2 puff Inhalation BID Caren Griffins, MD   2 puff at 09/13/19 0745  . ondansetron (ZOFRAN) injection 4 mg  4 mg Intravenous Q6H PRN Caren Griffins, MD      . pantoprazole (PROTONIX) EC tablet 20 mg  20 mg Oral Daily Caren Griffins, MD   20 mg at 09/13/19 2458  . remdesivir 100 mg in sodium chloride 0.9 % 250 mL IVPB  100 mg Intravenous Q24H Emiliano Dyer, [facility]   Stopped at 09/12/19 1741  . senna-docusate (Senokot-S) tablet 1 tablet  1 tablet Oral QHS PRN Caren Griffins, MD      .  traMADol (ULTRAM) tablet 25 mg  25 mg Oral Q12H PRN Caren Griffins, MD      . traZODone (DESYREL) tablet 25 mg  25 mg Oral Q2000 Caren Griffins, MD   25 mg at 09/12/19 2132  . umeclidinium bromide (INCRUSE ELLIPTA) 62.5 MCG/INH 1 puff  1 puff Inhalation Daily Caren Griffins, MD   1 puff at 09/13/19 0746     Discharge Medications: Please see discharge summary for a list of discharge medications.  Relevant Imaging Results:  Relevant Lab Results:   Additional Information SS# [id number]  Durenda Guthrie, RN

## 2019-09-13 NOTE — Progress Notes (Signed)
Spoke with her daughter [name] and she has no questions at this time.

## 2019-09-13 NOTE — Progress Notes (Signed)
PROGRESS NOTE                                                                                                                                                                                                             Patient Demographics:    Karen Perez, is a 68 y.o. female, DOB - [birth year], YBW:[id number]  Outpatient Primary MD for the patient is Lavera Guise, MD    LOS - 4  Admit date - 09/09/2019    CC - SOB     Brief Narrative  Karen Perez is a 68 y.o. female with medical history significant of COPD, DM 2, HTN, prior CVA, HLD, chronic diastolic CHF, OSA on CPAP (1L O2 nightly), history of DVT not on [facility] currently, prior multiple cerebral aneurysms followed at [facility] (3, all s/p clipping) who presents to [facility] ER from SNF due to shortness of breath and fever.  Staff found her O2 sats to be 77% on 2 L and she required 4 to 6 L on admission to the ED, work-up suggested COVID-19 pneumonitis and acute on chronic hypoxic respiratory failure and she was admitted to the hospital.   Subjective:   Patient in bed, appears comfortable, denies any headache, no fever, no chest pain or pressure, no shortness of breath , no abdominal pain. No focal weakness.   Assessment  & Plan :     1. Acute Hypoxic Resp. Failure due to Acute Covid 19 Viral Pneumonitis during the ongoing 2020 Covid 19 Pandemic - she has been adequately started on IV steroids along with remdesivir, clinically she has stabilized with stable inflammatory markers, was able to sit her in the chair and made her use incentive spirometry for atelectasis.  She is now requiring 2 L nasal cannula oxygen while I was in the room with a pulse ox in mid low 90s.  She is in no respiratory distress.  Continue to monitor closely and start tapering steroids.  She has been encouraged to sit up in chair and daytime use flutter valve and I-S for pulmonary toiletry and then prone when in  bed.     COVID-19 Labs  Recent Labs    09/11/19 0424 09/12/19 0055  DDIMER 1.58* 1.39*  CRP 1.2* 0.8    Lab Results  Component Value Date   SARSCOV2NAA POSITIVE (A) 09/09/2019  SpO2: 90 % O2 Flow Rate (L/min): 2 L/min FiO2 (%): (!) 4 %  Hepatic Function Latest Ref Rng & Units 09/13/2019 09/12/2019 09/11/2019  Total Protein 6.5 - 8.1 g/dL 7.1 7.3 7.1  Albumin 3.5 - 5.0 g/dL 3.4(L) 3.6 3.3(L)  AST 15 - 41 U/L 23 30 17   ALT 0 - 44 U/L 24 24 16   Alk Phosphatase 38 - 126 U/L 52 55 51  Total Bilirubin 0.3 - 1.2 mg/dL 0.3 0.5 0.4  Bilirubin, Direct 0.0 - 0.3 mg/dL - - -        Component Value Date/Time   BNP 28.0 09/13/2019 0030     2.  History of COPD.  Follows with Dr. Halford Chessman pulmonary.  No acute issues.  3. OSA.  Oxygen here at night.  Uses CPAP with 1 L oxygen bleed at night at SNF.  4.  Essential hypertension.  Blood pressure is stable will monitor.  5.  Acute on chronic diastolic CHF.  65% on echocardiogram done in April 2019.  Been diuresed with Lasix.  Currently off of it will monitor.  6.  GERD.  PPI.  7. HX Of CVA.  On Plavix and statin for secondary prevention.  PT OT here.  8.  Dyslipidemia.  Continue statin.  9.  Obesity.  BMI 48.  Follow with PCP for weight loss.  10.  Incidental thyroid mass.  PCP to monitor and do age-appropriate outpatient work-up.  11. E. coli UTI - Rocephin x 3 days.  12.  DM type II.  Currently on sliding scale since she is getting steroids will add Lantus.  Monitor and adjust.  Lab Results  Component Value Date   HGBA1C 6.6 (H) 09/10/2019   CBG (last 3)  Recent Labs    09/12/19 1143 09/12/19 1701 09/13/19 0740  GLUCAP 160* 262* 98     Condition -  Fair  Family Communication  : Called the listed cell phone #4 daughter Shamekia Tippets on 09/10/2019 at 9:20 AM.  No response > 8 rings, called and left message 10/27  @ 9.10 am.  Code Status :  Full  Diet :    Diet Order            Diet heart healthy/carb  modified Room service appropriate? Yes; Fluid consistency: Thin  Diet effective now               Disposition Plan  :  Home once Remdesivir course is done  Consults  :  None  Procedures  :   CTA - 1. No demonstrable pulmonary embolus. No thoracic aortic aneurysm or dissection. There is aortic atherosclerosis as well as foci of great vessel and coronary artery calcification. 2. Enlargement of the main pulmonary outflow tract, a finding indicative of pulmonary arterial hypertension. 3. Lower lobe airspace consolidation consistent with pneumonia. Minimal right pleural effusion. More patchy infiltrate right upper lobe. 4. Slightly prominent right pretracheal and subcarinal lymph nodes. Etiology for this lymph node prominence is uncertain. Reactive etiology secondary to the parenchymal lung changes is quite possible. 5. **An incidental finding of potential clinical significance has been found. Right lobe thyroid mass measuring 2.3 x 1.5 cm. Advise nonemergent thyroid ultrasound to further assess.** Aortic Atherosclerosis.   PUD Prophylaxis :  PPI  DVT Prophylaxis  :  Lovenox   Lab Results  Component Value Date   PLT 207 09/13/2019    Inpatient Medications  Scheduled Meds:  albuterol  1 puff Inhalation Q6H   atorvastatin  20 mg  Oral q1800   clopidogrel  75 mg Oral Daily   darifenacin  7.5 mg Oral Daily   enoxaparin (LOVENOX) injection  65 mg Subcutaneous Q24H   fluticasone  1 spray Each Nare Daily   gabapentin  300 mg Oral 2 times per day   insulin aspart  0-15 Units Subcutaneous TID WC   insulin aspart  0-5 Units Subcutaneous QHS   insulin glargine  12 Units Subcutaneous Daily   methylPREDNISolone (SOLU-MEDROL) injection  40 mg Intravenous Daily   mirabegron ER  25 mg Oral Daily   mometasone-formoterol  2 puff Inhalation BID   pantoprazole  20 mg Oral Daily   traZODone  25 mg Oral Q2000   umeclidinium bromide  1 puff Inhalation Daily   Continuous Infusions:   cefTRIAXone (ROCEPHIN)  IV 1 g (09/13/19 0934)   remdesivir 100 mg in NS 250 mL Stopped (09/12/19 1741)   PRN Meds:.acetaminophen, cyclobenzaprine, diphenhydrAMINE, guaiFENesin-dextromethorphan, [DISCONTINUED] ondansetron **OR** ondansetron (ZOFRAN) IV, senna-docusate, traMADol  Antibiotics  :    Anti-infectives (From admission, onward)   Start     Dose/Rate Route Frequency Ordered Stop   09/12/19 1000  cefTRIAXone (ROCEPHIN) 1 g in sodium chloride 0.9 % 100 mL IVPB     1 g 200 mL/hr over 30 Minutes Intravenous Every 24 hours 09/12/19 0915 09/15/19 0959   09/10/19 1600  remdesivir 100 mg in sodium chloride 0.9 % 250 mL IVPB     100 mg 500 mL/hr over 30 Minutes Intravenous Every 24 hours 09/09/19 1731 09/14/19 1559   09/09/19 1830  remdesivir 200 mg in sodium chloride 0.9 % 250 mL IVPB     200 mg 500 mL/hr over 30 Minutes Intravenous Once 09/09/19 1731 09/09/19 1908       Time Spent in minutes  30   Lala Lund M.D on 09/13/2019 at 9:56 AM  To page go to [email or web address] - password [facility]  [facility] -  Office  [phone]  See all Orders from today for further details    Objective:   Vitals:   09/12/19 1910 09/13/19 0415 09/13/19 0500 09/13/19 0831  BP: 124/88 130/70  (!) 89/74  Pulse: 78 79  75  Resp: 20 20  18   Temp: 98.4 F (36.9 C) 98.3 F (36.8 C)  98.1 F (36.7 C)  TempSrc: Oral Oral  Oral  SpO2: 93% 98%  90%  Weight:   131.8 kg   Height:        Wt Readings from Last 3 Encounters:  09/13/19 131.8 kg  09/09/19 134.7 kg  07/26/19 132.5 kg     Intake/Output Summary (Last 24 hours) at 09/13/2019 0956 Last data filed at 09/13/2019 0832 Gross per 24 hour  Intake 1400 ml  Output 650 ml  Net 750 ml     Physical Exam  Awake Alert,  No new F.N deficits, Normal affect [place].AT,PERRAL Supple Neck,No JVD, No cervical lymphadenopathy appriciated.  Symmetrical Chest wall movement, Good air movement bilaterally, CTAB RRR,No Gallops, Rubs or new  Murmurs, No Parasternal Heave +ve B.Sounds, Abd Soft, No tenderness, No organomegaly appriciated, No rebound - guarding or rigidity. No Cyanosis, Clubbing or edema, No new Rash or bruise     Data Review:    CBC Recent Labs  Lab 09/09/19 0524 09/10/19 0209 09/11/19 0424 09/12/19 0055 09/13/19 0030  WBC 5.4 4.0 3.0* 5.0 5.1  HGB 12.1 12.9 12.9 13.5 13.1  HCT 42.7 46.4* 45.3 47.2* 46.1*  PLT 193 186 195 196 207  MCV 97.7 100.2*  97.8 97.1 98.3  MCH 27.7 27.9 27.9 27.8 27.9  MCHC 28.3* 27.8* 28.5* 28.6* 28.4*  RDW 15.6* 15.6* 15.3 15.2 15.3  LYMPHSABS 0.6* 0.3* 0.5* 0.6* 1.2  MONOABS 0.7 0.2 0.3 0.5 0.8  EOSABS 0.0 0.0 0.0 0.0 0.0  BASOSABS 0.0 0.0 0.0 0.0 0.0    Chemistries  Recent Labs  Lab 09/09/19 0603 09/10/19 0209 09/11/19 0424 09/12/19 0055 09/13/19 0030  NA 140 139 139 136 140  K 4.1 4.3 4.0 4.0 3.9  CL 94* 87* 90* 86* 94*  CO2 38* 41* 39* 39* 35*  GLUCOSE 105* 127* 165* 117* 135*  BUN 10 14 20 23 22   CREATININE 0.74 0.82 0.72 0.73 0.68  CALCIUM 8.6* 8.6* 8.6* 8.2* 8.6*  MG  --  2.2 2.1 2.3  --   AST 16 18 17 30 23   ALT 14 16 16 24 24   ALKPHOS 53 59 51 55 52  BILITOT 0.3 0.5 0.4 0.5 0.3   ------------------------------------------------------------------------------------------------------------------ No results for input(s): CHOL, HDL, LDLCALC, TRIG, CHOLHDL, LDLDIRECT in the last 72 hours.  Lab Results  Component Value Date   HGBA1C 6.6 (H) 09/10/2019   ------------------------------------------------------------------------------------------------------------------ No results for input(s): TSH, T4TOTAL, T3FREE, THYROIDAB in the last 72 hours.  Invalid input(s): FREET3  Cardiac Enzymes No results for input(s): CKMB, TROPONINI, MYOGLOBIN in the last 168 hours.  Invalid input(s): CK ------------------------------------------------------------------------------------------------------------------    Component Value Date/Time   BNP 28.0  09/13/2019 0030    Micro Results Recent Results (from the past 240 hour(s))  Culture, blood (routine x 2)     Status: None (Preliminary result)   Collection Time: 09/09/19  5:20 AM   Specimen: BLOOD  Result Value Ref Range Status   Specimen Description BLOOD LEFT ANTECUBITAL  Final   Special Requests   Final    BOTTLES DRAWN AEROBIC AND ANAEROBIC Blood Culture results may not be optimal due to an excessive volume of blood received in culture bottles   Culture   Final    NO GROWTH 4 DAYS Performed at [facility], [address], [name], [place] [address]    Report Status PENDING  Incomplete  Culture, blood (routine x 2)     Status: None (Preliminary result)   Collection Time: 09/09/19  5:20 AM   Specimen: BLOOD  Result Value Ref Range Status   Specimen Description BLOOD LEFT WRIST  Final   Special Requests   Final    BOTTLES DRAWN AEROBIC AND ANAEROBIC Blood Culture adequate volume   Culture   Final    NO GROWTH 4 DAYS Performed at [facility], [address] [place],  [address]    Report Status PENDING  Incomplete  SARS Coronavirus 2 by RT PCR (hospital order, performed in [place] hospital lab) Nasopharyngeal Nasopharyngeal Swab     Status: Abnormal   Collection Time: 09/09/19  5:24 AM   Specimen: Nasopharyngeal Swab  Result Value Ref Range Status   SARS Coronavirus 2 POSITIVE (A) NEGATIVE Final    Comment: RESULT CALLED TO, READ BACK BY AND VERIFIED WITH: BRANDY DAVIS 09/09/2019 0718 [facility] (NOTE) If result is NEGATIVE SARS-CoV-2 target nucleic acids are NOT DETECTED. The SARS-CoV-2 RNA is generally detectable in upper and lower  respiratory specimens during the acute phase of infection. The lowest  concentration of SARS-CoV-2 viral copies this assay can detect is 250  copies / mL. A negative result does not preclude SARS-CoV-2 infection  and should not be used as the sole basis for  treatment or other  patient management decisions.  A  negative result may occur with  improper specimen collection / handling, submission of specimen other  than nasopharyngeal swab, presence of viral mutation(s) within the  areas targeted by this assay, and inadequate number of viral copies  (<250 copies / mL). A negative result must be combined with clinical  observations, patient history, and epidemiological information. If result is POSITIVE SARS-CoV-2 target nucleic acids are DETECTED. The  SARS-CoV-2 RNA is generally detectable in upper and lower  respiratory specimens during the acute phase of infection.  Positive  results are indicative of active infection with SARS-CoV-2.  Clinical  correlation with patient history and other diagnostic information is  necessary to determine patient infection status.  Positive results do  not rule out bacterial infection or co-infection with other viruses. If result is PRESUMPTIVE POSTIVE SARS-CoV-2 nucleic acids MAY BE PRESENT.   A presumptive positive result was obtained on the submitted specimen  and confirmed on repeat testing.  While 2019 novel coronavirus  (SARS-CoV-2) nucleic acids may be present in the submitted sample  additional confirmatory testing may be necessary for epidemiological  and / or clinical management purposes  to differentiate between  SARS-CoV-2 and other Sarbecovirus currently known to infect humans.  If clinically indicated additional testing with an alternate test  methodology [phone]) is ad vised. The SARS-CoV-2 RNA is generally  detectable in upper and lower respiratory specimens during the acute  phase of infection. The expected result is Negative. Fact Sheet for Patients:  StrictlyIdeas.no Fact Sheet for Healthcare Providers: BankingDealers.co.za This test is not yet approved or cleared by the [place] FDA and has been authorized for detection and/or diagnosis of SARS-CoV-2 by FDA under an Emergency Use  Authorization (EUA).  This EUA will remain in effect (meaning this test can be used) for the duration of the COVID-19 declaration under Section 564(b)(1) of the Act, 21 U.S.C. section 360bbb-3(b)(1), unless the authorization is terminated or revoked sooner. Performed at [facility], [address] [place], [place] [address]   Urine culture     Status: Abnormal   Collection Time: 09/09/19  5:56 AM   Specimen: Urine, Catheterized  Result Value Ref Range Status   Specimen Description   Final    URINE, CATHETERIZED Performed at [facility], [address], [place], [place] [address]    Special Requests   Final    NONE Performed at [facility], [address] [place], [place] [address]    Culture (A)  Final    >=100,000 COLONIES/mL ESCHERICHIA COLI >=100,000 COLONIES/mL AEROCOCCUS URINAE Standardized susceptibility testing for this organism is not available. Performed at [facility], [place] [address] [place], [place] [address]    Report Status 09/13/2019 FINAL  Final   Organism ID, Bacteria ESCHERICHIA COLI (A)  Final      Susceptibility   Escherichia coli - MIC*    AMPICILLIN 4 SENSITIVE Sensitive     CEFAZOLIN <=4 SENSITIVE Sensitive     CEFTRIAXONE <=1 SENSITIVE Sensitive     CIPROFLOXACIN 1 SENSITIVE Sensitive     GENTAMICIN <=1 SENSITIVE Sensitive     IMIPENEM <=0.25 SENSITIVE Sensitive     NITROFURANTOIN <=16 SENSITIVE Sensitive     TRIMETH/SULFA <=20 SENSITIVE Sensitive     AMPICILLIN/SULBACTAM <=2 SENSITIVE Sensitive     PIP/TAZO <=4 SENSITIVE Sensitive     Extended ESBL NEGATIVE Sensitive     * >=100,000 COLONIES/mL ESCHERICHIA COLI    Radiology  Reports Ct Angio Chest Pe W And/or Wo Contrast  Result Date: 09/09/2019 CLINICAL DATA:  Shortness of breath and fever. Decreased oxygen saturation EXAM: CT ANGIOGRAPHY CHEST WITH CONTRAST TECHNIQUE: Multidetector CT imaging of the chest was performed using the standard protocol  during bolus administration of intravenous contrast. Multiplanar CT image reconstructions and MIPs were obtained to evaluate the vascular anatomy. CONTRAST:  167m OMNIPAQUE IOHEXOL 350 MG/ML SOLN COMPARISON:  Chest radiograph September 09, 2019; CT angiogram chest Mar 20, 2018 FINDINGS: Cardiovascular: There is no demonstrable pulmonary embolus. There is no thoracic aortic aneurysm or dissection. There are foci of calcification in the proximal right innominate and proximal left subclavian arteries. Other visualized great vessels appear unremarkable. There are foci aortic atherosclerosis. There are foci of coronary artery calcification. Heart is mildly enlarged. There is no pericardial effusion or pericardial thickening. The main pulmonary outflow tract measures 4.0 cm, enlarged. Mediastinum/Nodes: There is a mass in the right lobe of the thyroid measuring 2.3 x 1.5 cm. There are subcentimeter mediastinal and axillary lymph nodes. There is a right pretracheal lymph node measuring 1.3 x 1.0 cm. There is a subcarinal lymph node measuring 1.3 x 1.2 cm. No esophageal lesions are evident. Lungs/Pleura: There is airspace consolidation in both lower lobes, more on the right than on the left, with a minimal amount of pleural effusion on the right. More patchy infiltrate is noted in the right upper lobe. Upper Abdomen: Visualized upper abdominal structures appear unremarkable and stable. Musculoskeletal: There are foci of degenerative change in the thoracic spine. There are no blastic or lytic bone lesions. No evident chest wall lesions. Review of the MIP images confirms the above findings. IMPRESSION: 1. No demonstrable pulmonary embolus. No thoracic aortic aneurysm or dissection. There is aortic atherosclerosis as well as foci of great vessel and coronary artery calcification. 2. Enlargement of the main pulmonary outflow tract, a finding indicative of pulmonary arterial hypertension. 3. Lower lobe airspace consolidation  consistent with pneumonia. Minimal right pleural effusion. More patchy infiltrate right upper lobe. 4. Slightly prominent right pretracheal and subcarinal lymph nodes. Etiology for this lymph node prominence is uncertain. Reactive etiology secondary to the parenchymal lung changes is quite possible. 5. **An incidental finding of potential clinical significance has been found. Right lobe thyroid mass measuring 2.3 x 1.5 cm. Advise nonemergent thyroid ultrasound to further assess.** Aortic Atherosclerosis (ICD10-I70.0). Electronically Signed   By: WLowella GripIII M.D.   On: 09/09/2019 16:45   Dg Chest Port 1 View  Result Date: 09/09/2019 CLINICAL DATA:  Short of breath. EXAM: PORTABLE CHEST 1 VIEW COMPARISON:  03/19/2018 FINDINGS: Mild cardiac enlargement. Pulmonary vascular congestion and borderline interstitial edema noted. Decreased lung volumes. No airspace opacifications. IMPRESSION: 1. Suspect mild CHF. Electronically Signed   By: TKerby MoorsM.D.   On: 09/09/2019 06:11

## 2019-09-13 NOTE — Progress Notes (Signed)
Physical Therapy Treatment Patient Details Name: Karen Perez MRN: [id number] DOB: Oct 25, 1951 Today's Date: 09/13/2019    History of Present Illness Karen Perez is a 68 y.o. female with medical history significant of COPD, DM 2, HTN, prior CVA, HLD, chronic diastolic CHF, OSA on CPAP (1L O2 nightly), history of DVTmultiple cerebral aneurysms followed at [facility] (3, all s/p clipping) who presents to [facility] ER from SNF due to shortness of breath and fever. pt. resides in SNF since June. Staff found her O2 sats to be 77% on 2 L.In  the ED, work-up suggested COVID-19 pneumonitis and acute on chronic hypoxic respiratory failure    PT Comments    The patient is improving in self care and mobility. encouraged ambulation in room. Use of BSC. Continue PT for ambulation..  Patient on 1 l SPO2 95%.On RA, 88% after ambulation. Replaced 1 L.   Follow Up Recommendations  SNF;Supervision/Assistance - 24 hour     Equipment Recommendations  None recommended by PT    Recommendations for Other Services       Precautions / Restrictions Precautions Precautions: Fall Precaution Comments: monitor SOP2 Restrictions Weight Bearing Restrictions: No    Mobility  Bed Mobility Overal bed mobility: Needs Assistance Bed Mobility: Sidelying to Sit   Sidelying to sit: HOB elevated;Supervision       General bed mobility comments: patient did not require assist, use of bed rail.  Transfers Overall transfer level: Needs assistance Equipment used: Rolling walker (2 wheeled) Transfers: Sit to/from [employer] Sit to Stand: Min assist Stand pivot transfers: Min assist       General transfer comment: min A to steady and rise, cues for safety and to shift weight anteriorly when standing up, extra time to take a small step to reach to [facility], encouraged use of the RW.  Ambulation/Gait Ambulation/Gait assistance: Min assist Gait Distance (Feet): 20 Feet Assistive device: Rolling walker  (2 wheeled) Gait Pattern/deviations: Step-to pattern;Step-through pattern     General Gait Details: patient managed the RW in the room with min assit.   Stairs             Wheelchair Mobility    Modified Rankin (Stroke Patients Only)       Balance Overall balance assessment: Needs assistance Sitting-balance support: No upper extremity supported Sitting balance-Leahy Scale: Fair     Standing balance support: Bilateral upper extremity supported;During functional activity Standing balance-Leahy Scale: Fair                              Cognition Arousal/Alertness: Awake/alert Behavior During Therapy: Flat affect                                   General Comments: declined amb. in room after encouragement      Exercises      General Comments        Pertinent Vitals/Pain Pain Assessment: No/denies pain    Home Living                      Prior Function            PT Goals (current goals can now be found in the care plan section) Progress towards PT goals: Progressing toward goals    Frequency    Min 2X/week      PT Plan Current plan  remains appropriate    Co-evaluation              AM-PAC PT "6 Clicks" Mobility   Outcome Measure  Help needed turning from your back to your side while in a flat bed without using bedrails?: A Little Help needed moving from lying on your back to sitting on the side of a flat bed without using bedrails?: A Little Help needed moving to and from a bed to a chair (including a wheelchair)?: A Little Help needed standing up from a chair using your arms (e.g., wheelchair or bedside chair)?: A Little Help needed to walk in hospital room?: A Lot Help needed climbing 3-5 steps with a railing? : Total 6 Click Score: 15    End of Session Equipment Utilized During Treatment: Gait belt Activity Tolerance: Patient tolerated treatment well Patient left: in chair;with call bell/phone  within reach;with bed alarm set Nurse Communication: Mobility status PT Visit Diagnosis: Unsteadiness on feet (R26.81);Difficulty in walking, not elsewhere classified (R26.2)     Time: 5573-2202 PT Time Calculation (min) (ACUTE ONLY): 23 min  Charges:  $Gait Training: 8-22 mins $Self Care/Home Management: [place]  Office [phone]    Claretha Cooper 09/13/2019, 10:43 AM

## 2019-09-14 DIAGNOSIS — J9601 Acute respiratory failure with hypoxia: Secondary | ICD-10-CM | POA: Diagnosis not present

## 2019-09-14 DIAGNOSIS — U071 COVID-19: Secondary | ICD-10-CM | POA: Diagnosis not present

## 2019-09-14 LAB — CBC WITH DIFFERENTIAL/PLATELET
Abs Immature Granulocytes: 0.02 10*3/uL (ref 0.00–0.07)
Basophils Absolute: 0 10*3/uL (ref 0.0–0.1)
Basophils Relative: 0 %
Eosinophils Absolute: 0 10*3/uL (ref 0.0–0.5)
Eosinophils Relative: 0 %
HCT: 44.1 % (ref 36.0–46.0)
Hemoglobin: 12.6 g/dL (ref 12.0–15.0)
Immature Granulocytes: 0 %
Lymphocytes Relative: 21 %
Lymphs Abs: 1.2 10*3/uL (ref 0.7–4.0)
MCH: 27.8 pg (ref 26.0–34.0)
MCHC: 28.6 g/dL — ABNORMAL LOW (ref 30.0–36.0)
MCV: 97.4 fL (ref 80.0–100.0)
Monocytes Absolute: 0.7 10*3/uL (ref 0.1–1.0)
Monocytes Relative: 12 %
Neutro Abs: 3.9 10*3/uL (ref 1.7–7.7)
Neutrophils Relative %: 67 %
Platelets: 211 10*3/uL (ref 150–400)
RBC: 4.53 MIL/uL (ref 3.87–5.11)
RDW: 15.3 % (ref 11.5–15.5)
WBC: 5.9 10*3/uL (ref 4.0–10.5)
nRBC: 0 % (ref 0.0–0.2)

## 2019-09-14 LAB — CULTURE, BLOOD (ROUTINE X 2)
Culture: NO GROWTH
Culture: NO GROWTH
Special Requests: ADEQUATE

## 2019-09-14 LAB — COMPREHENSIVE METABOLIC PANEL
ALT: 20 U/L (ref 0–44)
AST: 15 U/L (ref 15–41)
Albumin: 3 g/dL — ABNORMAL LOW (ref 3.5–5.0)
Alkaline Phosphatase: 48 U/L (ref 38–126)
Anion gap: 8 (ref 5–15)
BUN: 19 mg/dL (ref 8–23)
CO2: 32 mmol/L (ref 22–32)
Calcium: 8.5 mg/dL — ABNORMAL LOW (ref 8.9–10.3)
Chloride: 97 mmol/L — ABNORMAL LOW (ref 98–111)
Creatinine, Ser: 0.71 mg/dL (ref 0.44–1.00)
GFR calc Af Amer: >60 mL/min (ref >60)
GFR calc non Af Amer: >60 mL/min (ref >60)
Glucose, Bld: 135 mg/dL — ABNORMAL HIGH (ref 70–99)
Potassium: 4.1 mmol/L (ref 3.5–5.1)
Sodium: 137 mmol/L (ref 135–145)
Total Bilirubin: 0.3 mg/dL (ref 0.3–1.2)
Total Protein: 6.4 g/dL — ABNORMAL LOW (ref 6.5–8.1)

## 2019-09-14 LAB — GLUCOSE, CAPILLARY
Glucose-Capillary: 86 mg/dL (ref 70–99)
Glucose-Capillary: 176 mg/dL — ABNORMAL HIGH (ref 70–99)

## 2019-09-14 LAB — BRAIN NATRIURETIC PEPTIDE: B Natriuretic Peptide: 20.3 pg/mL (ref 0.0–100.0)

## 2019-09-14 MED ORDER — SODIUM CHLORIDE 0.9 % IV SOLN
1.0000 g | INTRAVENOUS | Status: AC
  Administered 2019-09-14: 1 g via INTRAVENOUS
  Filled 2019-09-14: qty 10

## 2019-09-14 MED ORDER — METHYLPREDNISOLONE 4 MG PO TBPK: ORAL_TABLET | ORAL | 0 refills | Status: DC

## 2019-09-14 MED ORDER — TRAMADOL HCL 50 MG PO TABS: 25.0000 mg | ORAL_TABLET | Freq: Two times a day (BID) | ORAL | 0 refills | Status: DC | PRN

## 2019-09-14 NOTE — Care Management Important Message (Signed)
Important Message  Patient Details  Name: Karen Perez MRN: [id number] Date of Birth: 01-19-1951   Medicare Important Message Given:  Yes - Important Message mailed due to current National Emergency  Verbal consent obtained due to current National Emergency  Relationship to patient: Child Contact Name: Zawadi Aplin Call Date: 09/14/19  Time: 0762 Phone: [phone] Outcome: No Answer/Busy Important Message mailed to: Patient address on file    Delorse Lek 09/14/2019, 1:32 PM

## 2019-09-14 NOTE — Progress Notes (Signed)
Brief Pharmacy Note  Patient is a 68 y/o F who presented to [facility] ED from [facility] on 10/24 with respiratory distress. Subsequently tested positive for COVID-19 and was admitted at [facility] from 10/24-10/29. She received ceftriaxone x 3 days for UTI while admitted. Urine culture from ED visit resulted >100k colonies/mL Aerococcus urinae (sensitivies not reported) and >100k colonies/mL E coli (pan-sensitive). Based on empiric susceptibility data for Aerococcus urinae and sensitivities for E coli patient was appropriately covered on ceftriaxone. Will not pursue further antibiotic treatment at this time.   [facility] Resident 14 September 2019

## 2019-09-14 NOTE — Discharge Instructions (Signed)
Follow with Primary MD Karen Perez, Karen M, MD in 7 days   Get CBC, CMP, 2 view Chest X ray -  checked next visit within 1 week by Primary MD or SNF MD   Activity: As tolerated with Full fall precautions use walker/cane & assistance as needed  Disposition SNF  Diet: Heart Healthy Low Carb  Accuchecks 4 times/day, Once in AM empty stomach and then before each meal. Log in all results and show them to your Prim.MD in 3 days. If any glucose reading is under 80 or above 300 call your Prim MD immidiately. Follow Low glucose instructions for glucose under 80 as instructed.   Special Instructions: If you have smoked or chewed Tobacco  in the last 2 yrs please stop smoking, stop any regular Alcohol  and or any Recreational drug use.  On your next visit with your primary care physician please Get Medicines reviewed and adjusted.  Please request your Prim.MD to go over all Hospital Tests and Procedure/Radiological results at the follow up, please get all Hospital records sent to your Prim MD by signing hospital release before you go home.  If you experience worsening of your admission symptoms, develop shortness of breath, life threatening emergency, suicidal or homicidal thoughts you must seek medical attention immediately by calling 911 or calling your MD immediately  if symptoms less severe.  You Must read complete instructions/literature along with all the possible adverse reactions/side effects for all the Medicines you take and that have been prescribed to you. Take any new Medicines after you have completely understood and accpet all the possible adverse reactions/side effects.          Person Under Monitoring Name: Karen PolingLinda J Perez  Location: [facility] [address] [place] [id number]   Infection Prevention Recommendations for Individuals Confirmed to have, or Being Evaluated for, 2019 Novel Coronavirus (COVID-19) Infection Who Receive Care at Home  Individuals who are confirmed  to have, or are being evaluated for, COVID-19 should follow the prevention steps below until a healthcare provider or local or state health department says they can return to normal activities.  Stay home except to get medical care You should restrict activities outside your home, except for getting medical care. Do not go to work, school, or public areas, and do not use public transportation or taxis.  Call ahead before visiting your doctor Before your medical appointment, call the healthcare provider and tell them that you have, or are being evaluated for, COVID-19 infection. This will help the healthcare providers office take steps to keep other people from getting infected. Ask your healthcare provider to call the local or state health department.  Monitor your symptoms Seek prompt medical attention if your illness is worsening (e.g., difficulty breathing). Before going to your medical appointment, call the healthcare provider and tell them that you have, or are being evaluated for, COVID-19 infection. Ask your healthcare provider to call the local or state health department.  Wear a facemask You should wear a facemask that covers your nose and mouth when you are in the same room with other people and when you visit a healthcare provider. People who live with or visit you should also wear a facemask while they are in the same room with you.  Separate yourself from other people in your home As much as possible, you should stay in a different room from other people in your home. Also, you should use a separate bathroom, if available.  Avoid sharing household  items You should not share dishes, drinking glasses, cups, eating utensils, towels, bedding, or other items with other people in your home. After using these items, you should wash them thoroughly with soap and water.  Cover your coughs and sneezes Cover your mouth and nose with a tissue when you cough or sneeze, or you can  cough or sneeze into your sleeve. Throw used tissues in a lined trash can, and immediately wash your hands with soap and water for at least 20 seconds or use an alcohol-based hand rub.  Wash your [facility] your hands often and thoroughly with soap and water for at least 20 seconds. You can use an alcohol-based hand sanitizer if soap and water are not available and if your hands are not visibly dirty. Avoid touching your eyes, nose, and mouth with unwashed hands.   Prevention Steps for Caregivers and Household Members of Individuals Confirmed to have, or Being Evaluated for, COVID-19 Infection Being Cared for in the Home  If you live with, or provide care at home for, a person confirmed to have, or being evaluated for, COVID-19 infection please follow these guidelines to prevent infection:  Follow healthcare providers instructions Make sure that you understand and can help the patient follow any healthcare provider instructions for all care.  Provide for the patients basic needs You should help the patient with basic needs in the home and provide support for getting groceries, prescriptions, and other personal needs.  Monitor the patients symptoms If they are getting sicker, call his or her medical provider and tell them that the patient has, or is being evaluated for, COVID-19 infection. This will help the healthcare providers office take steps to keep other people from getting infected. Ask the healthcare provider to call the local or state health department.  Limit the number of people who have contact with the patient  If possible, have only one caregiver for the patient.  Other household members should stay in another home or place of residence. If this is not possible, they should stay  in another room, or be separated from the patient as much as possible. Use a separate bathroom, if available.  Restrict visitors who do not have an essential need to be in the  home.  Keep older adults, very young children, and other sick people away from the patient Keep older adults, very young children, and those who have compromised immune systems or chronic health conditions away from the patient. This includes people with chronic heart, lung, or kidney conditions, diabetes, and cancer.  Ensure good ventilation Make sure that shared spaces in the home have good air flow, such as from an air conditioner or an opened window, weather permitting.  Wash your hands often  Wash your hands often and thoroughly with soap and water for at least 20 seconds. You can use an alcohol based hand sanitizer if soap and water are not available and if your hands are not visibly dirty.  Avoid touching your eyes, nose, and mouth with unwashed hands.  Use disposable paper towels to dry your hands. If not available, use dedicated cloth towels and replace them when they become wet.  Wear a facemask and gloves  Wear a disposable facemask at all times in the room and gloves when you touch or have contact with the patients blood, body fluids, and/or secretions or excretions, such as sweat, saliva, sputum, nasal mucus, vomit, urine, or feces.  Ensure the mask fits over your nose and mouth tightly, and  do not touch it during use.  Throw out disposable facemasks and gloves after using them. Do not reuse.  Wash your hands immediately after removing your facemask and gloves.  If your personal clothing becomes contaminated, carefully remove clothing and launder. Wash your hands after handling contaminated clothing.  Place all used disposable facemasks, gloves, and other waste in a lined container before disposing them with other household waste.  Remove gloves and wash your hands immediately after handling these items.  Do not share dishes, glasses, or other household items with the patient  Avoid sharing household items. You should not share dishes, drinking glasses, cups, eating  utensils, towels, bedding, or other items with a patient who is confirmed to have, or being evaluated for, COVID-19 infection.  After the person uses these items, you should wash them thoroughly with soap and water.  Wash laundry thoroughly  Immediately remove and wash clothes or bedding that have blood, body fluids, and/or secretions or excretions, such as sweat, saliva, sputum, nasal mucus, vomit, urine, or feces, on them.  Wear gloves when handling laundry from the patient.  Read and follow directions on labels of laundry or clothing items and detergent. In general, wash and dry with the warmest temperatures recommended on the label.  Clean all areas the individual has used often  Clean all touchable surfaces, such as counters, tabletops, doorknobs, bathroom fixtures, toilets, phones, keyboards, tablets, and bedside tables, every day. Also, clean any surfaces that may have blood, body fluids, and/or secretions or excretions on them.  Wear gloves when cleaning surfaces the patient has come in contact with.  Use a diluted bleach solution (e.g., dilute bleach with 1 part bleach and 10 parts water) or a household disinfectant with a label that says EPA-registered for coronaviruses. To make a bleach solution at home, add 1 tablespoon of bleach to 1 quart (4 cups) of water. For a larger supply, add  cup of bleach to 1 gallon (16 cups) of water.  Read labels of cleaning products and follow recommendations provided on product labels. Labels contain instructions for safe and effective use of the cleaning product including precautions you should take when applying the product, such as wearing gloves or eye protection and making sure you have good ventilation during use of the product.  Remove gloves and wash hands immediately after cleaning.  Monitor yourself for signs and symptoms of illness Caregivers and household members are considered close contacts, should monitor their health, and will be  asked to limit movement outside of the home to the extent possible. Follow the monitoring steps for close contacts listed on the symptom monitoring form.   ? If you have additional questions, contact your local health department or call the epidemiologist on call at [phone] (available 24/7). ? This guidance is subject to change. For the most up-to-date guidance from [facility], please refer to their website: [email or web address]

## 2019-09-14 NOTE — Care Management Important Message (Signed)
Important Message  Patient Details  Name: Karen Perez MRN: [id number] Date of Birth: [birth year]   Medicare Important Message Given:  Yes - Important Message mailed due to current National Emergency  Daughter returned phone call, and I advised her of IM and what exactly it was for, I also advised her that I had mailed out an unsigned copy, since I was not able to get in touch with anyone before DC. She voiced understanding of document and did not have any questions regarding it.  Terryann Verbeek P Nolia Tschantz 09/14/2019, 1:52 PM

## 2019-09-14 NOTE — Discharge Summary (Addendum)
Karen Perez DOB: October 10, 1951 DOA: 09/09/2019  PCP: Lavera Guise, MD  Admit date: 09/09/2019  Discharge date: 09/14/2019  Admitted From: SNF   Disposition:  SNF   Recommendations for Outpatient Follow-up:   Follow up with PCP in 1-2 weeks  PCP Please obtain BMP/CBC, 2 view CXR in 1week,  (see Discharge instructions)   PCP Please follow up on the following pending results: Kindly review CT scan results in detail.  Outpatient age-appropriate follow-up as needed.   Home Health: None   Equipment/Devices: None  Consultations: None  Discharge Condition: Stable    CODE STATUS: Full    Diet Recommendation: Heart Healthy Low Carb    CC SOB   Brief history of present illness from the day of admission and additional interim summary    Karen Perez a 68 y.o.femalewith medical history significant ofCOPD, DM 2, HTN, prior CVA, HLD, chronic diastolic CHF, OSA on CPAP (1L O2 nightly), history of DVT not on AC currently, prior multiple cerebral aneurysms followed at [facility] (3, all s/p clipping) who [place] [facility] due to shortness of breath and fever. Staff found her O2 sats to be 77% on 2 L and she required 4 to 6 L on admission to the ED, work-up suggested COVID-19 pneumonitis and acute on chronic hypoxic respiratory failure and she was admitted to the hospital.                                                                 Hospital Course    1. Acute Hypoxic Resp. Failure due to Acute Covid 19 Viral Pneumonitis during the ongoing 2020 Covid 19 Pandemic + CHF - she has been adequately started on IV steroids along with remdesivir.  She is now symptom free on RA.  She has completed her Rx and will go back to SNF on a short PO steroid taper.  COVID-19 Labs  Recent Labs     09/12/19 0055  DDIMER 1.39*  CRP 0.8    Lab Results  Component Value Date   SARSCOV2NAA POSITIVE (A) 09/09/2019    2.  History of COPD.  Follows with Dr. Halford Chessman pulmonary.  No acute issues.  3. OSA.  Oxygen here at night.  Uses CPAP with 1 L oxygen bleed at night at SNF.  4.  Essential hypertension.  Blood pressure is stable will monitor.  5.  Acute on chronic diastolic CHF.  65% on echocardiogram done in April 2019, was given IV lasix, resumed home dose with close monitoring at SNF of Lasix dose.  6.  GERD.  PPI.  7. HX Of CVA.  On Plavix and statin for secondary prevention.  PT OT here.  8.  Dyslipidemia.  Continue statin.  9.  Obesity.  BMI 48.  Follow with PCP  for weight loss.  10.  Incidental thyroid mass.  PCP to monitor and do age-appropriate outpatient work-up.  11. E. coli UTI - Rocephin x 3 days.  12.  DM type II.  Home Rx.  Lab Results  Component Value Date   HGBA1C 6.6 (H) 09/10/2019     Discharge diagnosis     Active Problems:   OBESITY, MORBID   Essential hypertension   COPD (chronic obstructive pulmonary disease) (HCC)   Acute respiratory failure with hypoxia (HCC)   Diabetes mellitus type 2 in obese (HCC)   Chronic diastolic heart failure (HCC)   Acute hypoxemic respiratory failure due to COVID-19 [facility])    Discharge instructions    Discharge Instructions    Discharge instructions   Complete by: As directed    Follow with Primary MD Lyndon Code, MD in 7 days   Get CBC, CMP, 2 view Chest X ray -  checked next visit within 1 week by Primary MD or SNF MD   Activity: As tolerated with Full fall precautions use walker/cane & assistance as needed  Disposition SNF  Diet: Heart Healthy Low Carb  Accuchecks 4 times/day, Once in AM empty stomach and then before each meal. Log in all results and show them to your Prim.MD in 3 days. If any glucose reading is under 80 or above 300 call your Prim MD immidiately. Follow Low glucose  instructions for glucose under 80 as instructed.   Special Instructions: If you have smoked or chewed Tobacco  in the last 2 yrs please stop smoking, stop any regular Alcohol  and or any Recreational drug use.  On your next visit with your primary care physician please Get Medicines reviewed and adjusted.  Please request your Prim.MD to go over all Hospital Tests and Procedure/Radiological results at the follow up, please get all Hospital records sent to your Prim MD by signing hospital release before you go home.  If you experience worsening of your admission symptoms, develop shortness of breath, life threatening emergency, suicidal or homicidal thoughts you must seek medical attention immediately by calling 911 or calling your MD immediately  if symptoms less severe.  You Must read complete instructions/literature along with all the possible adverse reactions/side effects for all the Medicines you take and that have been prescribed to you. Take any new Medicines after you have completely understood and accpet all the possible adverse reactions/side effects.   Increase activity slowly   Complete by: As directed       Discharge Medications   Allergies as of 09/14/2019      Reactions   Amoxicillin    Amoxicillin-pot Clavulanate Diarrhea   Has patient had a PCN reaction causing immediate rash, facial/tongue/throat swelling, SOB or lightheadedness with hypotension: No Has patient had a PCN reaction causing severe rash involving mucus membranes or skin necrosis: No Has patient had a PCN reaction that required hospitalization: No Has patient had a PCN reaction occurring within the last 10 years: No If all of the above answers are "NO", then may proceed with Cephalosporin use.   Bacitracin    Neosporin [neomycin-bacitracin Zn-polymyx] Hives      Medication List    STOP taking these medications   clotrimazole-betamethasone cream Commonly known as: LOTRISONE   ketoconazole 2 %  cream Commonly known as: NIZORAL   lidocaine 5 % Commonly known as: LIDODERM   Nyamyc powder Generic drug: nystatin     TAKE these medications   acetaminophen 500 MG tablet Commonly known  as: TYLENOL Take 1,000 mg by mouth 3 (three) times daily. What changed: Another medication with the same name was removed. Continue taking this medication, and follow the directions you see here.   albuterol 108 (90 Base) MCG/ACT inhaler Commonly known as: VENTOLIN HFA Inhale 2 puffs into the lungs every 6 (six) hours as needed for wheezing or shortness of breath.   albuterol 108 (90 Base) MCG/ACT inhaler Commonly known as: VENTOLIN HFA Inhale 1 puff into the lungs every 6 (six) hours as needed.   atorvastatin 20 MG tablet Commonly known as: LIPITOR Take 1 tablet (20 mg total) by mouth daily. What changed: additional instructions   clopidogrel 75 MG tablet Commonly known as: PLAVIX Take 1 tablet (75 mg total) by mouth daily.   clotrimazole 1 % cream Commonly known as: LOTRIMIN Apply to affected area 2 times daily   cyclobenzaprine 5 MG tablet Commonly known as: FLEXERIL Take 5 mg by mouth 3 (three) times daily as needed.   dextromethorphan-guaiFENesin 10-100 MG/5ML liquid Commonly known as: ROBITUSSIN-DM Take 10 mLs by mouth every 4 (four) hours as needed for cough.   diclofenac sodium 1 % Gel Commonly known as: VOLTAREN Apply 2 g topically 2 (two) times daily. Apply to knees, feet BID   docusate sodium 100 MG capsule Commonly known as: COLACE Take by mouth 2 (two) times daily.   fluticasone 50 MCG/ACT nasal spray Commonly known as: FLONASE Place 1 spray into both nostrils daily.   Fluticasone-Salmeterol 500-50 MCG/DOSE Aepb Commonly known as: ADVAIR Inhale 1 puff into the lungs 2 (two) times daily.   furosemide 40 MG tablet Commonly known as: LASIX Take 40 mg by mouth daily as needed. Give if weight gain is over 5 lbs in a week and/or for edema What changed: Another  medication with the same name was changed. Make sure you understand how and when to take each.   furosemide 40 MG tablet Commonly known as: LASIX Take 1 tablet (40 mg total) by mouth 2 (two) times daily. What changed: when to take this   gabapentin 300 MG capsule Commonly known as: NEURONTIN Take 900 mg by mouth daily at 8 pm.   gabapentin 300 MG capsule Commonly known as: NEURONTIN Take 300 mg by mouth 2 (two) times daily. At 08:00 and 14:00   guaiFENesin 100 MG/5ML Soln Commonly known as: ROBITUSSIN Take 10 mLs by mouth every 4 (four) hours as needed for cough or to loosen phlegm.   lisinopril 2.5 MG tablet Commonly known as: ZESTRIL Take 1 tablet (2.5 mg total) by mouth daily.   loratadine 10 MG tablet Commonly known as: CLARITIN Take 10 mg by mouth daily.   Melatonin 5 MG Tabs Take 5 mg by mouth at bedtime.   metFORMIN 500 MG tablet Commonly known as: GLUCOPHAGE Take 500 mg by mouth 2 (two) times daily with a meal.   methylPREDNISolone 4 MG Tbpk tablet Commonly known as: MEDROL DOSEPAK follow package directions   Myrbetriq 25 MG Tb24 tablet Generic drug: mirabegron ER Take 25 mg by mouth daily.   pantoprazole 20 MG tablet Commonly known as: PROTONIX Take 20 mg by mouth daily as needed.   potassium chloride 10 MEQ tablet Commonly known as: KLOR-CON TAKE 2 TABLETS BY MOUTH EVERY DAY   potassium chloride SA 20 MEQ tablet Commonly known as: KLOR-CON   Refresh Celluvisc 1 % Gel Generic drug: Carboxymethylcellulose Sod PF Apply 1 drop to eye 4 (four) times daily.   Restasis 0.05 % ophthalmic emulsion Generic  drug: cycloSPORINE Place 1 drop into both eyes 2 (two) times daily.   solifenacin 5 MG tablet Commonly known as: VESICARE Take 1 tablet (5 mg total) by mouth daily.   Theraworx Relief Foam Apply 1 application topically 2 (two) times daily as needed.   tiotropium 18 MCG inhalation capsule Commonly known as: Spiriva HandiHaler Use once day    traMADol 50 MG tablet Commonly known as: ULTRAM Take 0.5 tablets (25 mg total) by mouth every 12 (twelve) hours as needed for moderate pain. What changed: See the new instructions.   traZODone 50 MG tablet Commonly known as: DESYREL Take 25 mg by mouth daily at 8 pm.       Follow-up Information    Lyndon Code, MD. Schedule an appointment as soon as possible for a visit in 1 week(s).   Specialty: Internal Medicine Contact information: [address] [place] [address] [phone]           Major procedures and Radiology Reports - PLEASE review detailed and final reports thoroughly  -         Ct Angio Chest Pe W And/or Wo Contrast  Result Date: 09/09/2019 CLINICAL DATA:  Shortness of breath and fever. Decreased oxygen saturation EXAM: CT ANGIOGRAPHY CHEST WITH CONTRAST TECHNIQUE: Multidetector CT imaging of the chest was performed using the standard protocol during bolus administration of intravenous contrast. Multiplanar CT image reconstructions and MIPs were obtained to evaluate the vascular anatomy. CONTRAST:  OMNIPAQUE IOHEXOL 350 MG/ML SOLN COMPARISON:  Chest radiograph September 09, 2019; CT angiogram chest Mar 20, 2018 FINDINGS: Cardiovascular: There is no demonstrable pulmonary embolus. There is no thoracic aortic aneurysm or dissection. There are foci of calcification in the proximal right innominate and proximal left subclavian arteries. Other visualized great vessels appear unremarkable. There are foci aortic atherosclerosis. There are foci of coronary artery calcification. Heart is mildly enlarged. There is no pericardial effusion or pericardial thickening. The main pulmonary outflow tract measures 4.0 cm, enlarged. Mediastinum/Nodes: There is a mass in the right lobe of the thyroid measuring 2.3 x 1.5 cm. There are subcentimeter mediastinal and axillary lymph nodes. There is a right pretracheal lymph node measuring 1.3 x 1.0 cm. There is a subcarinal lymph node  measuring 1.3 x 1.2 cm. No esophageal lesions are evident. Lungs/Pleura: There is airspace consolidation in both lower lobes, more on the right than on the left, with a minimal amount of pleural effusion on the right. More patchy infiltrate is noted in the right upper lobe. Upper Abdomen: Visualized upper abdominal structures appear unremarkable and stable. Musculoskeletal: There are foci of degenerative change in the thoracic spine. There are no blastic or lytic bone lesions. No evident chest wall lesions. Review of the MIP images confirms the above findings. IMPRESSION: 1. No demonstrable pulmonary embolus. No thoracic aortic aneurysm or dissection. There is aortic atherosclerosis as well as foci of great vessel and coronary artery calcification. 2. Enlargement of the main pulmonary outflow tract, a finding indicative of pulmonary arterial hypertension. 3. Lower lobe airspace consolidation consistent with pneumonia. Minimal right pleural effusion. More patchy infiltrate right upper lobe. 4. Slightly prominent right pretracheal and subcarinal lymph nodes. Etiology for this lymph node prominence is uncertain. Reactive etiology secondary to the parenchymal lung changes is quite possible. 5. **An incidental finding of potential clinical significance has been found. Right lobe thyroid mass measuring 2.3 x 1.5 cm. Advise nonemergent thyroid ultrasound to further assess.** Aortic Atherosclerosis (ICD10-I70.0). Electronically Signed   By: Chrissie Noa  Margarita Grizzle III M.D.   On: 09/09/2019 16:45   Dg Chest Port 1 View  Result Date: 09/09/2019 CLINICAL DATA:  Short of breath. EXAM: PORTABLE CHEST 1 VIEW COMPARISON:  03/19/2018 FINDINGS: Mild cardiac enlargement. Pulmonary vascular congestion and borderline interstitial edema noted. Decreased lung volumes. No airspace opacifications. IMPRESSION: 1. Suspect mild CHF. Electronically Signed   By: Signa Kell M.D.   On: 09/09/2019 06:11    Micro Results     Recent Results  (from the past 240 hour(s))  Culture, blood (routine x 2)     Status: None   Collection Time: 09/09/19  5:20 AM   Specimen: BLOOD  Result Value Ref Range Status   Specimen Description BLOOD LEFT ANTECUBITAL  Final   Special Requests   Final    BOTTLES DRAWN AEROBIC AND ANAEROBIC Blood Culture results may not be optimal due to an excessive volume of blood received in culture bottles   Culture   Final    NO GROWTH 5 DAYS Performed at [facility], [address], [place], [place] [address]    Report Status 09/14/2019 FINAL  Final  Culture, blood (routine x 2)     Status: None   Collection Time: 09/09/19  5:20 AM   Specimen: BLOOD  Result Value Ref Range Status   Specimen Description BLOOD LEFT WRIST  Final   Special Requests   Final    BOTTLES DRAWN AEROBIC AND ANAEROBIC Blood Culture adequate volume   Culture   Final    NO GROWTH 5 DAYS Performed at [facility], [address], [place], [place] [address]    Report Status 09/14/2019 FINAL  Final  SARS Coronavirus 2 by RT PCR (hospital order, performed in [facility] hospital lab) Nasopharyngeal Nasopharyngeal Swab     Status: Abnormal   Collection Time: 09/09/19  5:24 AM   Specimen: Nasopharyngeal Swab  Result Value Ref Range Status   SARS Coronavirus 2 POSITIVE (A) NEGATIVE Final    Comment: RESULT CALLED TO, READ BACK BY AND VERIFIED WITH: BRANDY DAVIS 09/09/2019 0718 [facility] (NOTE) If result is NEGATIVE SARS-CoV-2 target nucleic acids are NOT DETECTED. The SARS-CoV-2 RNA is generally detectable in upper and lower  respiratory specimens during the acute phase of infection. The lowest  concentration of SARS-CoV-2 viral copies this assay can detect is 250  copies / mL. A negative result does not preclude SARS-CoV-2 infection  and should not be used as the sole basis for treatment or other  patient management decisions.  A negative result may occur with  improper specimen collection / handling, submission of  specimen other  than nasopharyngeal swab, presence of viral mutation(s) within the  areas targeted by this assay, and inadequate number of viral copies  (<250 copies / mL). A negative result must be combined with clinical  observations, patient history, and epidemiological information. If result is POSITIVE SARS-CoV-2 target nucleic acids are DETECTED. The  SARS-CoV-2 RNA is generally detectable in upper and lower  respiratory specimens during the acute phase of infection.  Positive  results are indicative of active infection with SARS-CoV-2.  Clinical  correlation with patient history and other diagnostic information is  necessary to determine patient infection status.  Positive results do  not rule out bacterial infection or co-infection with other viruses. If result is PRESUMPTIVE POSTIVE SARS-CoV-2 nucleic acids MAY BE PRESENT.   A presumptive positive result was obtained on the submitted specimen  and confirmed on repeat testing.  While 2019 novel coronavirus  (  SARS-CoV-2) nucleic acids may be present in the submitted sample  additional confirmatory testing may be necessary for epidemiological  and / or clinical management purposes  to differentiate between  SARS-CoV-2 and other Sarbecovirus currently known to infect humans.  If clinically indicated additional testing with an alternate test  methodology [phone]) is ad vised. The SARS-CoV-2 RNA is generally  detectable in upper and lower respiratory specimens during the acute  phase of infection. The expected result is Negative. Fact Sheet for Patients:  BoilerBrush.com.cy Fact Sheet for Healthcare Providers: https://pope.com/ This test is not yet approved or cleared by the [place] FDA and has been authorized for detection and/or diagnosis of SARS-CoV-2 by FDA under an Emergency Use Authorization (EUA).  This EUA will remain in effect (meaning this test can be used) for the  duration of the COVID-19 declaration under Section 564(b)(1) of the Act, 21 U.S.C. section 360bbb-3(b)(1), unless the authorization is terminated or revoked sooner. Performed at [facility], [address] [place], [place] [address]   Urine culture     Status: Abnormal   Collection Time: 09/09/19  5:56 AM   Specimen: Urine, Catheterized  Result Value Ref Range Status   Specimen Description   Final    URINE, CATHETERIZED Performed at [facility], [address] [place], [place] [address]    Special Requests   Final    NONE Performed at [facility], [address] [place], [place] [address]    Culture (A)  Final    >=100,000 COLONIES/mL ESCHERICHIA COLI >=100,000 COLONIES/mL AEROCOCCUS URINAE Standardized susceptibility testing for this organism is not available. Performed at [facility], [address], [place], [place] [address]    Report Status 09/13/2019 FINAL  Final   Organism ID, Bacteria ESCHERICHIA COLI (A)  Final      Susceptibility   Escherichia coli - MIC*    AMPICILLIN 4 SENSITIVE Sensitive     CEFAZOLIN <=4 SENSITIVE Sensitive     CEFTRIAXONE <=1 SENSITIVE Sensitive     CIPROFLOXACIN 1 SENSITIVE Sensitive     GENTAMICIN <=1 SENSITIVE Sensitive     IMIPENEM <=0.25 SENSITIVE Sensitive     NITROFURANTOIN <=16 SENSITIVE Sensitive     TRIMETH/SULFA <=20 SENSITIVE Sensitive     AMPICILLIN/SULBACTAM <=2 SENSITIVE Sensitive     PIP/TAZO <=4 SENSITIVE Sensitive     Extended ESBL NEGATIVE Sensitive     * >=100,000 COLONIES/mL ESCHERICHIA COLI    Today   Subjective    Karen Perez today has no headache,no chest abdominal pain,no new weakness tingling or numbness, feels much better     Objective   Blood pressure 112/78, pulse 71, temperature 98.3 F (36.8 C), temperature source Oral, resp. rate 20, height  (1.651 m), weight 130 kg, SpO2 92 %.   Intake/Output Summary (Last 24 hours) at 09/14/2019 0816 Last data  filed at 09/13/2019 2000 Gross per 24 hour  Intake 1170 ml  Output 300 ml  Net 870 ml    Exam Awake Alert, No new F.N deficits, Normal affect [place].AT,PERRAL Supple Neck,No JVD, No cervical lymphadenopathy appriciated.  Symmetrical Chest wall movement, Good air movement bilaterally, CTAB RRR,No Gallops,Rubs or new Murmurs, No Parasternal Heave +ve B.Sounds, Abd Soft, Non tender, No organomegaly appriciated, No rebound -guarding or rigidity. No Cyanosis, Clubbing or edema, No new Rash or bruise   Data Review   CBC w Diff:  Lab Results  Component Value Date   WBC 5.9 09/14/2019   HGB 12.6  09/14/2019   HCT 44.1 09/14/2019   PLT 211 09/14/2019   LYMPHOPCT 21 09/14/2019   MONOPCT 12 09/14/2019   EOSPCT 0 09/14/2019   BASOPCT 0 09/14/2019    CMP:  Lab Results  Component Value Date   NA 137 09/14/2019   K 4.1 09/14/2019   CL 97 (L) 09/14/2019   CO2 32 09/14/2019   BUN 19 09/14/2019   CREATININE 0.71 09/14/2019   PROT 6.4 (L) 09/14/2019   ALBUMIN 3.0 (L) 09/14/2019   BILITOT 0.3 09/14/2019   ALKPHOS 48 09/14/2019   AST 15 09/14/2019   ALT 20 09/14/2019  .   Total Time in preparing paper work, data evaluation and todays exam - 35 minutes  Susa Raring M.D on 09/14/2019 at 8:16 AM  [facility]   Office  [phone]

## 2019-09-14 NOTE — Progress Notes (Signed)
Nurse to nurse report given to [facility], RN at [employer].  Patient transported via PTAR back to facility.

## 2019-09-14 NOTE — TOC Transition Note (Signed)
Transition of Care [facility]) - CM/SW Discharge Note   Patient Details  Name: Karen Perez MRN: [id number] Date of Birth: [birth year]  [facility] CM/SW Contact:  Ninfa Meeker, RN Phone Number: [phone] (working remotely) 09/14/2019, 11:15 AM   Clinical Narrative:   Patient will DC to: [facility] Anticipated DC date: 09/14/19 Family notified: Daughter, [name] Transport by: PTAR  12:30pm   Patient is ready for discharge back to [facility] per MD. Bedside RN, Charge nurse, patient's family and facility have all been notified of discharge plan. Discharge Summary and FL2 sent to [facility] via the HUB. Bedside RN to call report to [phone]. Case manager has printed Medical necessity form and face sheet to nursing unit. Ambulance transport is requested for patient.    Final next level of care: Skilled Nursing Facility Barriers to Discharge: No Barriers Identified   Patient Goals and CMS Choice Patient states their goals for this hospitalization and ongoing recovery are:: per daughter : get better CMS Medicare.gov Compare Post Acute Care list provided to:: Other (Comment Required)    Discharge Placement                       Discharge Plan and Services   Discharge Planning Services: CM Consult            DME Arranged: N/A DME Agency: NA       HH Arranged: NA HH Agency: NA        Social Determinants of Health (SDOH) Interventions     Readmission Risk Interventions No flowsheet data found.

## 2019-09-18 ENCOUNTER — Telehealth: Payer: Self-pay | Admitting: Family

## 2019-09-18 NOTE — Telephone Encounter (Signed)
TELEPHONE CALL NOTE  Karen Perez has been deemed a candidate for a follow-up tele-health visit to limit community exposure during the Covid-19 pandemic. I spoke with [facility] via phone to ensure availability of phone/video source, confirm preferred email & phone number, and discuss instructions and expectations.  I reminded [facility] to be prepared with any vital sign and/or heart rhythm information that could potentially be obtained via home monitoring, at the time of her visit. I reminded [facility] to expect a phone call prior to her visit.  Did the patient or representative verbally consent to virtual visit? [place], [place] 09/18/2019 2:38 PM     FULL LENGTH CONSENT FOR TELE-HEALTH VISIT   I hereby voluntarily request, consent and authorize [facility] and its employed or contracted physicians, physician assistants, nurse practitioners or other licensed health care professionals (the Practitioner), to provide me with telemedicine health care services (the "Services") as deemed necessary by the treating Practitioner. I acknowledge and consent to receive the Services by the Practitioner via telemedicine. I understand that the telemedicine visit will involve communicating with the Practitioner through live audiovisual communication technology and the disclosure of certain medical information by electronic transmission. I acknowledge that I have been given the opportunity to request an in-person assessment or other available alternative prior to the telemedicine visit and am voluntarily participating in the telemedicine visit.  I understand that I have the right to withhold or withdraw my consent to the use of telemedicine in the course of my care at any time, without affecting my right to future care or treatment, and that the Practitioner or I may terminate the telemedicine visit at any time. I understand that I have the right to inspect all information obtained  and/or recorded in the course of the telemedicine visit and may receive copies of available information for a reasonable fee.  I understand that some of the potential risks of receiving the Services via telemedicine include:  Marland Kitchen Delay or interruption in medical evaluation due to technological equipment failure or disruption; . Information transmitted may not be sufficient (e.g. poor resolution of images) to allow for appropriate medical decision making by the Practitioner; and/or  . In rare instances, security protocols could fail, causing a breach of personal health information.  Furthermore, I acknowledge that it is my responsibility to provide information about my medical history, conditions and care that is complete and accurate to the best of my ability. I acknowledge that Practitioner's advice, recommendations, and/or decision may be based on factors not within their control, such as incomplete or inaccurate data provided by me or distortions of diagnostic images or specimens that may result from electronic transmissions. I understand that the practice of medicine is not an exact science and that Practitioner makes no warranties or guarantees regarding treatment outcomes. I acknowledge that I will receive a copy of this consent concurrently upon execution via email to the email address I last provided but may also request a printed copy by calling the office of [place].    I understand that my insurance will be billed for this visit.   I have read or had this consent read to me. . I understand the contents of this consent, which adequately explains the benefits and risks of the Services being provided via telemedicine.  . I have been provided ample opportunity to ask questions regarding this consent and the Services and have had my questions answered to my satisfaction. . I give my  informed consent for the services to be provided through the use of telemedicine in my medical care  By  participating in this telemedicine visit I agree to the above.

## 2019-09-18 NOTE — Progress Notes (Deleted)
Patient ID: Karen Perez, female    DOB: December 06, 1950, 68 y.o.   MRN: [id number]  HPI  Ms Breshears is a 68 y/o female with a history of DM, asthma, HTN, stroke, COPD, arthritis, recent tobacco use and chronic heart failure.   Echo report from 06/06/18 reviewed and showed an EF of 55-60%. Echo report from 02/23/18 reviewed and showed an EF of 65-70% along with mild TR and normal PA pressure.   Admitted 06/05/18 due to COPD exacerbation. Medications adjusted. Treated also for UTI. Discharged after 6 days. Was in the ED 05/11/18 due to right knee pain where she was treated and released.   She presents today for a follow-up visit with a chief complaint of moderate fatigue upon minimal exertion. She describes this as chronic in nature having been present for several years. She has associated pedal edema and leg pain along with this. She denies any difficulty sleeping, abdominal distention, palpitations, chest pain, shortness of breath, cough or dizziness. Unsure if she's being weighed daily or not.   Past Medical History:  Diagnosis Date  . Arthritis   . Asthma   . Brain aneurysm   . CHF (congestive heart failure) (HCC)   . COPD (chronic obstructive pulmonary disease) (HCC)   . Diabetes mellitus without complication (HCC)   . Hypertension   . Stroke [facility]) 2003   Past Surgical History:  Procedure Laterality Date  . CEREBRAL ANEURYSM REPAIR  2013   Family History  Problem Relation Age of Onset  . Diabetes Mellitus II Daughter    Social History   Tobacco Use  . Smoking status: Former Smoker    Packs/day: 1.00    Years: 50.00    Pack years: 50.00    Types: Cigarettes    Quit date: 02/15/2018    Years since quitting: 1.5  . Smokeless tobacco: Never Used  Substance Use Topics  . Alcohol use: No   Allergies  Allergen Reactions  . Amoxicillin   . Amoxicillin-Pot Clavulanate Diarrhea    Has patient had a PCN reaction causing immediate rash, facial/tongue/throat swelling, SOB or  lightheadedness with hypotension: No Has patient had a PCN reaction causing severe rash involving mucus membranes or skin necrosis: No Has patient had a PCN reaction that required hospitalization: No Has patient had a PCN reaction occurring within the last 10 years: No If all of the above answers are "NO", then may proceed with Cephalosporin use.   . Bacitracin   . Neosporin [Neomycin-Bacitracin Zn-Polymyx] Hives     Review of Systems  Constitutional: Positive for fatigue. Negative for appetite change.  HENT: Positive for rhinorrhea. Negative for congestion and sore throat.   Eyes: Negative.   Respiratory: Negative for cough and shortness of breath.        + snoring  Cardiovascular: Positive for leg swelling. Negative for chest pain and palpitations.  Gastrointestinal: Negative for abdominal distention and abdominal pain.  Endocrine: Negative.   Genitourinary: Negative.   Musculoskeletal: Positive for arthralgias (leg pain due to arthritis). Negative for neck pain.  Skin: Negative.   Allergic/Immunologic: Negative.   Neurological: Negative for dizziness and light-headedness.  Hematological: Negative for adenopathy. Does not bruise/bleed easily.  Psychiatric/Behavioral: Negative for dysphoric mood and sleep disturbance (sleeping on 3 pillows). The patient is not nervous/anxious.      Physical Exam Vitals signs and nursing note reviewed.  Constitutional:      Appearance: She is well-developed.  HENT:     Head: Normocephalic and atraumatic.  Neck:  Musculoskeletal: Normal range of motion and neck supple.     Vascular: No JVD.  Cardiovascular:     Rate and Rhythm: Normal rate and regular rhythm.  Pulmonary:     Effort: Pulmonary effort is normal. No respiratory distress.     Breath sounds: No wheezing or rales.  Abdominal:     General: There is no distension.     Palpations: Abdomen is soft.  Musculoskeletal:        General: No tenderness.     Right lower leg: Edema  (trace) present.     Left lower leg: Edema (trace) present.  Skin:    General: Skin is warm and dry.  Psychiatric:        Behavior: Behavior normal.    Assessment & Plan:  1: Chronic heart failure with preserved ejection fraction- - NYHA class III - euvolemic today - patient doesn't think she's being weighed daily at [facility] Order written for them to weigh her daily and call for an overnight weight gain of >2 pounds or a weekly weight gain of >5 pounds - unable to be weighed in the office as she says that she can't stand herself up - not adding salt to her food. Reviewed the importance of following a 2000mg  sodium diet once she returns home.  - finished PT and is now doing exercises on her own, also wheels herself to dining room and does walk short distances - wearing TED hose now - had home sleep study done 05/12/18; titration test at sleep lab completed 08/04/18 - BNP 06/15/18 was 27.0 - has not gotten flu vaccine yet  2: HTN- - BP looks good today - sees PCP at [facility] - BMP 03/21/18 reviewed and showed sodium 143, potassium 3.3 and GFR >60  3: COPD- - saw pulmonology Volanda Napoleon) 07/04/18  4: Diabetes- - glucose at facility was 103 - A1c on 02/23/18 was 5.6%  Facility medication list was reviewed.  Return in 4 months or sooner for any questions/problems before then.

## 2019-09-21 ENCOUNTER — Emergency Department
Admission: EM | Admit: 2019-09-21 | Discharge: 2019-09-21 | Disposition: A | Discharge status: Other Acute Inpatient Hospital | Payer: Medicare Other | Attending: Emergency Medicine | Admitting: Emergency Medicine

## 2019-09-21 ENCOUNTER — Telehealth: Payer: Medicare Other | Admitting: Family

## 2019-09-21 ENCOUNTER — Inpatient Hospital Stay (HOSPITAL_COMMUNITY)
Admission: AD | Admit: 2019-09-21 | Discharge: 2019-10-02 | DRG: 207 | Disposition: A | Discharge status: Skilled Nursing Facility | Payer: Medicare Other | Source: Other Acute Inpatient Hospital | Attending: Internal Medicine | Admitting: Internal Medicine

## 2019-09-21 ENCOUNTER — Emergency Department: Payer: Medicare Other

## 2019-09-21 ENCOUNTER — Other Ambulatory Visit: Payer: Self-pay

## 2019-09-21 DIAGNOSIS — I5032 Chronic diastolic (congestive) heart failure: Secondary | ICD-10-CM | POA: Diagnosis present

## 2019-09-21 DIAGNOSIS — U071 COVID-19: Secondary | ICD-10-CM | POA: Diagnosis present

## 2019-09-21 DIAGNOSIS — Z7984 Long term (current) use of oral hypoglycemic drugs: Secondary | ICD-10-CM | POA: Diagnosis not present

## 2019-09-21 DIAGNOSIS — J9602 Acute respiratory failure with hypercapnia: Secondary | ICD-10-CM | POA: Diagnosis not present

## 2019-09-21 DIAGNOSIS — X58XXXA Exposure to other specified factors, initial encounter: Secondary | ICD-10-CM | POA: Diagnosis present

## 2019-09-21 DIAGNOSIS — T502X5A Adverse effect of carbonic-anhydrase inhibitors, benzothiadiazides and other diuretics, initial encounter: Secondary | ICD-10-CM | POA: Diagnosis present

## 2019-09-21 DIAGNOSIS — J449 Chronic obstructive pulmonary disease, unspecified: Secondary | ICD-10-CM | POA: Diagnosis not present

## 2019-09-21 DIAGNOSIS — E785 Hyperlipidemia, unspecified: Secondary | ICD-10-CM | POA: Diagnosis present

## 2019-09-21 DIAGNOSIS — Z9981 Dependence on supplemental oxygen: Secondary | ICD-10-CM

## 2019-09-21 DIAGNOSIS — J441 Chronic obstructive pulmonary disease with (acute) exacerbation: Secondary | ICD-10-CM | POA: Diagnosis present

## 2019-09-21 DIAGNOSIS — J440 Chronic obstructive pulmonary disease with acute lower respiratory infection: Secondary | ICD-10-CM | POA: Diagnosis present

## 2019-09-21 DIAGNOSIS — J9601 Acute respiratory failure with hypoxia: Secondary | ICD-10-CM | POA: Diagnosis not present

## 2019-09-21 DIAGNOSIS — J9622 Acute and chronic respiratory failure with hypercapnia: Secondary | ICD-10-CM | POA: Diagnosis present

## 2019-09-21 DIAGNOSIS — J9621 Acute and chronic respiratory failure with hypoxia: Secondary | ICD-10-CM | POA: Diagnosis present

## 2019-09-21 DIAGNOSIS — Z888 Allergy status to other drugs, medicaments and biological substances status: Secondary | ICD-10-CM

## 2019-09-21 DIAGNOSIS — G4733 Obstructive sleep apnea (adult) (pediatric): Secondary | ICD-10-CM | POA: Diagnosis present

## 2019-09-21 DIAGNOSIS — M1990 Unspecified osteoarthritis, unspecified site: Secondary | ICD-10-CM | POA: Diagnosis present

## 2019-09-21 DIAGNOSIS — Z9119 Patient's noncompliance with other medical treatment and regimen: Secondary | ICD-10-CM

## 2019-09-21 DIAGNOSIS — J189 Pneumonia, unspecified organism: Secondary | ICD-10-CM | POA: Diagnosis not present

## 2019-09-21 DIAGNOSIS — K219 Gastro-esophageal reflux disease without esophagitis: Secondary | ICD-10-CM | POA: Diagnosis present

## 2019-09-21 DIAGNOSIS — Z79899 Other long term (current) drug therapy: Secondary | ICD-10-CM

## 2019-09-21 DIAGNOSIS — Z7902 Long term (current) use of antithrombotics/antiplatelets: Secondary | ICD-10-CM | POA: Diagnosis not present

## 2019-09-21 DIAGNOSIS — I10 Essential (primary) hypertension: Secondary | ICD-10-CM | POA: Diagnosis not present

## 2019-09-21 DIAGNOSIS — Z87891 Personal history of nicotine dependence: Secondary | ICD-10-CM | POA: Diagnosis not present

## 2019-09-21 DIAGNOSIS — J9690 Respiratory failure, unspecified, unspecified whether with hypoxia or hypercapnia: Secondary | ICD-10-CM

## 2019-09-21 DIAGNOSIS — Z881 Allergy status to other antibiotic agents status: Secondary | ICD-10-CM | POA: Diagnosis not present

## 2019-09-21 DIAGNOSIS — I110 Hypertensive heart disease with heart failure: Secondary | ICD-10-CM | POA: Insufficient documentation

## 2019-09-21 DIAGNOSIS — J1282 Pneumonia due to coronavirus disease 2019: Secondary | ICD-10-CM | POA: Diagnosis present

## 2019-09-21 DIAGNOSIS — E1165 Type 2 diabetes mellitus with hyperglycemia: Secondary | ICD-10-CM | POA: Diagnosis present

## 2019-09-21 DIAGNOSIS — Z880 Allergy status to penicillin: Secondary | ICD-10-CM | POA: Diagnosis not present

## 2019-09-21 DIAGNOSIS — J1289 Other viral pneumonia: Secondary | ICD-10-CM | POA: Diagnosis present

## 2019-09-21 DIAGNOSIS — E079 Disorder of thyroid, unspecified: Secondary | ICD-10-CM | POA: Diagnosis present

## 2019-09-21 DIAGNOSIS — I5031 Acute diastolic (congestive) heart failure: Secondary | ICD-10-CM | POA: Insufficient documentation

## 2019-09-21 DIAGNOSIS — Z6841 Body Mass Index (BMI) 40.0 and over, adult: Secondary | ICD-10-CM

## 2019-09-21 DIAGNOSIS — T380X5A Adverse effect of glucocorticoids and synthetic analogues, initial encounter: Secondary | ICD-10-CM | POA: Diagnosis present

## 2019-09-21 DIAGNOSIS — I509 Heart failure, unspecified: Secondary | ICD-10-CM

## 2019-09-21 DIAGNOSIS — Z8673 Personal history of transient ischemic attack (TIA), and cerebral infarction without residual deficits: Secondary | ICD-10-CM | POA: Diagnosis not present

## 2019-09-21 DIAGNOSIS — Z833 Family history of diabetes mellitus: Secondary | ICD-10-CM

## 2019-09-21 DIAGNOSIS — Z7951 Long term (current) use of inhaled steroids: Secondary | ICD-10-CM

## 2019-09-21 DIAGNOSIS — E119 Type 2 diabetes mellitus without complications: Secondary | ICD-10-CM | POA: Insufficient documentation

## 2019-09-21 DIAGNOSIS — E876 Hypokalemia: Secondary | ICD-10-CM | POA: Diagnosis present

## 2019-09-21 DIAGNOSIS — Z79891 Long term (current) use of opiate analgesic: Secondary | ICD-10-CM

## 2019-09-21 DIAGNOSIS — R7989 Other specified abnormal findings of blood chemistry: Secondary | ICD-10-CM | POA: Diagnosis not present

## 2019-09-21 LAB — BASIC METABOLIC PANEL
Anion gap: 11 (ref 5–15)
BUN: 16 mg/dL (ref 8–23)
CO2: 35 mmol/L — ABNORMAL HIGH (ref 22–32)
Calcium: 8.9 mg/dL (ref 8.9–10.3)
Chloride: 96 mmol/L — ABNORMAL LOW (ref 98–111)
Creatinine, Ser: 0.72 mg/dL (ref 0.44–1.00)
GFR calc Af Amer: >60 mL/min (ref >60)
GFR calc non Af Amer: >60 mL/min (ref >60)
Glucose, Bld: 116 mg/dL — ABNORMAL HIGH (ref 70–99)
Potassium: 4.7 mmol/L (ref 3.5–5.1)
Sodium: 142 mmol/L (ref 135–145)

## 2019-09-21 LAB — BLOOD GAS, VENOUS
Acid-Base Excess: 15.4 mmol/L — ABNORMAL HIGH (ref 0.0–2.0)
Bicarbonate: 47.3 mmol/L — ABNORMAL HIGH (ref 20.0–28.0)
O2 Saturation: 90.2 %
Patient temperature: 37
pCO2, Ven: 103 mmHg (ref 44.0–60.0)
pH, Ven: 7.27 (ref 7.250–7.430)
pO2, Ven: 67 mmHg — ABNORMAL HIGH (ref 32.0–45.0)

## 2019-09-21 LAB — CBC WITH DIFFERENTIAL/PLATELET
Abs Immature Granulocytes: 0.03 10*3/uL (ref 0.00–0.07)
Basophils Absolute: 0 10*3/uL (ref 0.0–0.1)
Basophils Relative: 0 %
Eosinophils Absolute: 0 10*3/uL (ref 0.0–0.5)
Eosinophils Relative: 0 %
HCT: 45.1 % (ref 36.0–46.0)
Hemoglobin: 12.5 g/dL (ref 12.0–15.0)
Immature Granulocytes: 0 %
Lymphocytes Relative: 11 %
Lymphs Abs: 0.8 10*3/uL (ref 0.7–4.0)
MCH: 27.7 pg (ref 26.0–34.0)
MCHC: 27.7 g/dL — ABNORMAL LOW (ref 30.0–36.0)
MCV: 99.8 fL (ref 80.0–100.0)
Monocytes Absolute: 0.7 10*3/uL (ref 0.1–1.0)
Monocytes Relative: 9 %
Neutro Abs: 5.9 10*3/uL (ref 1.7–7.7)
Neutrophils Relative %: 80 %
Platelets: 182 10*3/uL (ref 150–400)
RBC: 4.52 MIL/uL (ref 3.87–5.11)
RDW: 16.4 % — ABNORMAL HIGH (ref 11.5–15.5)
WBC: 7.4 10*3/uL (ref 4.0–10.5)
nRBC: 0.3 % — ABNORMAL HIGH (ref 0.0–0.2)

## 2019-09-21 LAB — POCT I-STAT 7, (LYTES, BLD GAS, ICA,H+H)
Acid-Base Excess: 14 mmol/L — ABNORMAL HIGH (ref 0.0–2.0)
Bicarbonate: 39.3 mmol/L — ABNORMAL HIGH (ref 20.0–28.0)
Calcium, Ion: 1.15 mmol/L (ref 1.15–1.40)
HCT: 41 % (ref 36.0–46.0)
Hemoglobin: 13.9 g/dL (ref 12.0–15.0)
O2 Saturation: 99 %
Patient temperature: 99.1
Potassium: 4 mmol/L (ref 3.5–5.1)
Sodium: 139 mmol/L (ref 135–145)
TCO2: 41 mmol/L — ABNORMAL HIGH (ref 22–32)
pCO2 arterial: 52.8 mmHg — ABNORMAL HIGH (ref 32.0–48.0)
pH, Arterial: 7.481 — ABNORMAL HIGH (ref 7.350–7.450)
pO2, Arterial: 118 mmHg — ABNORMAL HIGH (ref 83.0–108.0)

## 2019-09-21 LAB — ABO/RH: ABO/RH(D): A POS

## 2019-09-21 LAB — BLOOD GAS, ARTERIAL
Acid-Base Excess: 16.6 mmol/L — ABNORMAL HIGH (ref 0.0–2.0)
Bicarbonate: 45 mmol/L — ABNORMAL HIGH (ref 20.0–28.0)
FIO2: 100
MECHVT: 500 mL
O2 Saturation: 98.5 %
PEEP: 10 cmH2O
Patient temperature: 37
RATE: 22 resp/min
pCO2 arterial: 71 mmHg (ref 32.0–48.0)
pH, Arterial: 7.41 (ref 7.350–7.450)
pO2, Arterial: 114 mmHg — ABNORMAL HIGH (ref 83.0–108.0)

## 2019-09-21 LAB — PHOSPHORUS: Phosphorus: 2.2 mg/dL — ABNORMAL LOW (ref 2.5–4.6)

## 2019-09-21 LAB — PROCALCITONIN: Procalcitonin: <0.1 ng/mL

## 2019-09-21 LAB — TROPONIN I (HIGH SENSITIVITY): Troponin I (High Sensitivity): 16 ng/L (ref <18)

## 2019-09-21 LAB — MRSA PCR SCREENING: MRSA by PCR: NEGATIVE

## 2019-09-21 LAB — BRAIN NATRIURETIC PEPTIDE: B Natriuretic Peptide: 58 pg/mL (ref 0.0–100.0)

## 2019-09-21 LAB — MAGNESIUM: Magnesium: 1.8 mg/dL (ref 1.7–2.4)

## 2019-09-21 MED ORDER — ATORVASTATIN CALCIUM 10 MG PO TABS: 20.0000 mg | ORAL_TABLET | Freq: Every day | ORAL | Status: DC

## 2019-09-21 MED ORDER — SODIUM CHLORIDE 0.9 % IV SOLN: 100.0000 mg | INTRAVENOUS | Status: DC

## 2019-09-21 MED ORDER — PROPOFOL 1000 MG/100ML IV EMUL
INTRAVENOUS | Status: AC
  Administered 2019-09-21: 17:00:00 45 ug/kg/min via INTRAVENOUS
  Filled 2019-09-21: qty 100

## 2019-09-21 MED ORDER — METHYLPREDNISOLONE SODIUM SUCC 40 MG IJ SOLR
40.0000 mg | Freq: Two times a day (BID) | INTRAMUSCULAR | Status: DC
  Administered 2019-09-21 – 2019-09-23 (×4): 40 mg via INTRAVENOUS
  Filled 2019-09-21 (×4): qty 1

## 2019-09-21 MED ORDER — ENOXAPARIN SODIUM 60 MG/0.6ML ~~LOC~~ SOLN
60.0000 mg | Freq: Two times a day (BID) | SUBCUTANEOUS | Status: DC
  Administered 2019-09-21 – 2019-09-22 (×2): 60 mg via SUBCUTANEOUS
  Filled 2019-09-21 (×2): qty 0.6

## 2019-09-21 MED ORDER — PROPOFOL 1000 MG/100ML IV EMUL
INTRAVENOUS | Status: AC
  Administered 2019-09-21: 10:00:00 via INTRAVENOUS
  Filled 2019-09-21: qty 100

## 2019-09-21 MED ORDER — PRO-STAT SUGAR FREE PO LIQD
30.0000 mL | Freq: Two times a day (BID) | ORAL | Status: DC
  Administered 2019-09-21 – 2019-09-22 (×2): 30 mL
  Filled 2019-09-21 (×2): qty 30

## 2019-09-21 MED ORDER — SENNA 8.6 MG PO TABS
1.0000 | ORAL_TABLET | Freq: Two times a day (BID) | ORAL | Status: DC
  Administered 2019-09-21 – 2019-09-26 (×10): 8.6 mg
  Filled 2019-09-21 (×10): qty 1

## 2019-09-21 MED ORDER — ONDANSETRON HCL 4 MG/2ML IJ SOLN: 4.0000 mg | Freq: Four times a day (QID) | INTRAMUSCULAR | Status: DC | PRN

## 2019-09-21 MED ORDER — ONDANSETRON HCL 4 MG PO TABS: 4.0000 mg | ORAL_TABLET | Freq: Four times a day (QID) | ORAL | Status: DC | PRN

## 2019-09-21 MED ORDER — SODIUM CHLORIDE 0.9 % IV SOLN
1.0000 g | Freq: Once | INTRAVENOUS | Status: AC
  Administered 2019-09-21: 07:00:00 1 g via INTRAVENOUS
  Filled 2019-09-21: qty 10

## 2019-09-21 MED ORDER — FENTANYL CITRATE (PF) 100 MCG/2ML IJ SOLN
25.0000 ug | INTRAMUSCULAR | Status: DC | PRN
  Administered 2019-09-25: 25 ug via INTRAVENOUS
  Filled 2019-09-21: qty 2

## 2019-09-21 MED ORDER — SORBITOL 70 % SOLN: 30.0000 mL | Freq: Every day | Status: DC | PRN

## 2019-09-21 MED ORDER — PROPOFOL 1000 MG/100ML IV EMUL
0.0000 ug/kg/min | INTRAVENOUS | Status: DC
  Administered 2019-09-21: 17:00:00 45 ug/kg/min via INTRAVENOUS
  Administered 2019-09-21 – 2019-09-22 (×2): 50 ug/kg/min via INTRAVENOUS
  Administered 2019-09-22: 56 ug/kg/min via INTRAVENOUS
  Administered 2019-09-22 (×5): 50 ug/kg/min via INTRAVENOUS
  Administered 2019-09-23: 20 ug/kg/min via INTRAVENOUS
  Administered 2019-09-23: 40 ug/kg/min via INTRAVENOUS
  Administered 2019-09-23: 50 ug/kg/min via INTRAVENOUS
  Administered 2019-09-23: 40 ug/kg/min via INTRAVENOUS
  Administered 2019-09-23: 30 ug/kg/min via INTRAVENOUS
  Administered 2019-09-23: 50 ug/kg/min via INTRAVENOUS
  Administered 2019-09-24: 02:00:00 45 ug/kg/min via INTRAVENOUS
  Filled 2019-09-21 (×3): qty 100
  Filled 2019-09-21: qty 200
  Filled 2019-09-21 (×7): qty 100
  Filled 2019-09-21 (×3): qty 200
  Filled 2019-09-21: qty 100

## 2019-09-21 MED ORDER — ACETAMINOPHEN 325 MG PO TABS: 650.0000 mg | ORAL_TABLET | Freq: Four times a day (QID) | ORAL | Status: DC | PRN

## 2019-09-21 MED ORDER — CLOPIDOGREL BISULFATE 75 MG PO TABS: 75.0000 mg | ORAL_TABLET | Freq: Every day | ORAL | Status: DC

## 2019-09-21 MED ORDER — IPRATROPIUM-ALBUTEROL 0.5-2.5 (3) MG/3ML IN SOLN
3.0000 mL | Freq: Once | RESPIRATORY_TRACT | Status: AC
  Administered 2019-09-21: 06:00:00 3 mL via RESPIRATORY_TRACT
  Filled 2019-09-21: qty 3

## 2019-09-21 MED ORDER — SODIUM CHLORIDE 0.9 % IV SOLN
500.0000 mg | Freq: Once | INTRAVENOUS | Status: AC
  Administered 2019-09-21: 08:00:00 500 mg via INTRAVENOUS
  Filled 2019-09-21: qty 500

## 2019-09-21 MED ORDER — ATORVASTATIN CALCIUM 10 MG PO TABS
20.0000 mg | ORAL_TABLET | Freq: Every day | ORAL | Status: DC
  Administered 2019-09-21 – 2019-09-25 (×5): 20 mg
  Filled 2019-09-21 (×5): qty 2

## 2019-09-21 MED ORDER — DARIFENACIN HYDROBROMIDE ER 7.5 MG PO TB24: 7.5000 mg | ORAL_TABLET | Freq: Every day | ORAL | Status: DC

## 2019-09-21 MED ORDER — CYCLOSPORINE 0.05 % OP EMUL
1.0000 [drp] | Freq: Two times a day (BID) | OPHTHALMIC | Status: DC
  Administered 2019-09-21 – 2019-10-02 (×21): 1 [drp] via OPHTHALMIC
  Filled 2019-09-21 (×25): qty 30

## 2019-09-21 MED ORDER — PROPOFOL 1000 MG/100ML IV EMUL
INTRAVENOUS | Status: AC
  Filled 2019-09-21: qty 100

## 2019-09-21 MED ORDER — SODIUM CHLORIDE 0.9 % IV SOLN
2.0000 g | Freq: Three times a day (TID) | INTRAVENOUS | Status: AC
  Administered 2019-09-21 – 2019-09-28 (×21): 2 g via INTRAVENOUS
  Filled 2019-09-21 (×21): qty 2

## 2019-09-21 MED ORDER — FENTANYL CITRATE (PF) 100 MCG/2ML IJ SOLN
25.0000 ug | INTRAMUSCULAR | Status: DC | PRN
  Administered 2019-09-22 (×2): 100 ug via INTRAVENOUS
  Filled 2019-09-21 (×2): qty 2

## 2019-09-21 MED ORDER — SODIUM CHLORIDE 0.9 % IV SOLN
200.0000 mg | Freq: Once | INTRAVENOUS | Status: DC
  Filled 2019-09-21: qty 40

## 2019-09-21 MED ORDER — DEXAMETHASONE SODIUM PHOSPHATE 10 MG/ML IJ SOLN
10.0000 mg | Freq: Once | INTRAMUSCULAR | Status: AC
  Administered 2019-09-21: 10 mg via INTRAVENOUS
  Filled 2019-09-21: qty 1

## 2019-09-21 MED ORDER — CLOPIDOGREL BISULFATE 75 MG PO TABS
75.0000 mg | ORAL_TABLET | Freq: Every day | ORAL | Status: DC
  Administered 2019-09-21 – 2019-09-26 (×6): 75 mg
  Filled 2019-09-21 (×7): qty 1

## 2019-09-21 MED ORDER — ETOMIDATE 2 MG/ML IV SOLN
INTRAVENOUS | Status: DC | PRN
  Administered 2019-09-21: 15 mg via INTRAVENOUS

## 2019-09-21 MED ORDER — IPRATROPIUM-ALBUTEROL 0.5-2.5 (3) MG/3ML IN SOLN
3.0000 mL | RESPIRATORY_TRACT | Status: DC
  Administered 2019-09-21 – 2019-09-24 (×16): 3 mL via RESPIRATORY_TRACT
  Filled 2019-09-21 (×16): qty 3

## 2019-09-21 MED ORDER — CHLORHEXIDINE GLUCONATE 0.12% ORAL RINSE (MEDLINE KIT)
15.0000 mL | Freq: Two times a day (BID) | OROMUCOSAL | Status: DC
  Administered 2019-09-21 – 2019-09-26 (×11): 15 mL via OROMUCOSAL

## 2019-09-21 MED ORDER — ORAL CARE MOUTH RINSE
15.0000 mL | OROMUCOSAL | Status: DC
  Administered 2019-09-21 – 2019-09-26 (×50): 15 mL via OROMUCOSAL

## 2019-09-21 MED ORDER — SENNA 8.6 MG PO TABS: 1.0000 | ORAL_TABLET | Freq: Two times a day (BID) | ORAL | Status: DC

## 2019-09-21 MED ORDER — VITAL HIGH PROTEIN PO LIQD
1000.0000 mL | ORAL | Status: DC
  Administered 2019-09-21 (×2): 1000 mL

## 2019-09-21 MED ORDER — FAMOTIDINE IN NACL 20-0.9 MG/50ML-% IV SOLN
20.0000 mg | Freq: Two times a day (BID) | INTRAVENOUS | Status: DC
  Administered 2019-09-21: 22:00:00 20 mg via INTRAVENOUS
  Filled 2019-09-21: qty 50

## 2019-09-21 MED ORDER — ENOXAPARIN SODIUM 60 MG/0.6ML ~~LOC~~ SOLN: 60.0000 mg | SUBCUTANEOUS | Status: DC

## 2019-09-21 MED ORDER — ARFORMOTEROL TARTRATE 15 MCG/2ML IN NEBU
15.0000 ug | INHALATION_SOLUTION | Freq: Two times a day (BID) | RESPIRATORY_TRACT | Status: DC
  Administered 2019-09-21 – 2019-09-26 (×10): 15 ug via RESPIRATORY_TRACT
  Filled 2019-09-21 (×13): qty 2

## 2019-09-21 MED ORDER — DEXAMETHASONE SODIUM PHOSPHATE 10 MG/ML IJ SOLN: 6.0000 mg | INTRAMUSCULAR | Status: DC

## 2019-09-21 MED ORDER — DEXAMETHASONE SODIUM PHOSPHATE 10 MG/ML IJ SOLN
10.0000 mg | Freq: Once | INTRAMUSCULAR | Status: AC
  Administered 2019-09-21: 06:00:00 10 mg via INTRAVENOUS
  Filled 2019-09-21: qty 1

## 2019-09-21 MED ORDER — BUDESONIDE 0.5 MG/2ML IN SUSP
0.5000 mg | Freq: Two times a day (BID) | RESPIRATORY_TRACT | Status: DC
  Administered 2019-09-21 – 2019-09-26 (×10): 0.5 mg via RESPIRATORY_TRACT
  Filled 2019-09-21 (×10): qty 2

## 2019-09-21 MED ORDER — SUCCINYLCHOLINE CHLORIDE 20 MG/ML IJ SOLN
INTRAMUSCULAR | Status: DC | PRN
  Administered 2019-09-21: 120 mg via INTRAVENOUS

## 2019-09-21 NOTE — ED Notes (Signed)
[facility]  CALLED  FOR  TRANSFER 

## 2019-09-21 NOTE — ED Provider Notes (Signed)
-----------------------------------------   8:09 AM on 09/21/2019 -----------------------------------------  Patient care assumed from Dr. Owens Shark.  I discussed the patient with [facility].  They have accepted to their service, pending bed availability.  Patient currently satting in the upper 80s on high flow nasal cannula.  We will continue to titrate to maintain sats over 90%.  Patient otherwise appears very stable.  Currently being covered with antibiotics to cover for any possible superinfection also has received dexamethasone.  ----------------------------------------- 9:58 AM on 09/21/2019 -----------------------------------------  Patient has deteriorated over the past 2 hours, VBG shows a PCO2 greater than 100.  Patient is very somnolent, falls asleep and desats to 72% even on high flow nasal cannula oxygen.  Attempted to wake patient up she will increase her saturation into the mid upper 80s however falls asleep within seconds.  At this point I discussed with the patient that we likely would need to intubate, patient is reluctantly agreeable.  Intubation performed by myself without issue.  We will sedate with propofol.  We will update [place] Performed by: Harvest Dark  Required items: required blood products, implants, devices, and special equipment available Patient identity confirmed: provided demographic data and hospital-assigned identification number Time out: Immediately prior to procedure a "time out" was called to verify the correct patient, procedure, equipment, support staff and site/side marked as required.  Indications: Respiratory failure  Intubation method: S4 Glidescope Laryngoscopy   Preoxygenation: 100% BVM  Sedatives: 15 mg etomidate Paralytic: 120 mg succinylcholine  Tube Size: 7.5 cuffed  Post-procedure assessment: chest rise and ETCO2 monitor Breath sounds: equal and absent over the epigastrium Tube secured with:  ETT holder Chest x-ray interpreted by radiologist and me.  Chest x-ray findings: endotracheal tube in appropriate position  Patient tolerated the procedure well with no immediate complications.      Harvest Dark, MD 09/21/19 [phone]

## 2019-09-21 NOTE — Progress Notes (Deleted)
Remdesivir - Pharmacy Brief Note   O:  ALT: 20 CXR: hazy airspace opacities bilaterally with more focal consolidation involving the RUL Intubated   A/P:  Remdesivir 200 mg once followed by 100 mg daily x 4 days.   Gretta Arab PharmD, BCPS Clinical pharmacist phone 7am- 5pm: [phone] 09/21/2019 3:36 PM

## 2019-09-21 NOTE — ED Notes (Signed)
Changed diaper for large amount urine.  purewick placed.

## 2019-09-21 NOTE — ED Notes (Signed)
Updated daughter tina via phone.

## 2019-09-21 NOTE — Progress Notes (Signed)
Currently on TF and CBG q 4h. Hx of DM. Paged MD regarding CBG of 209 and not currently on SSI.

## 2019-09-21 NOTE — H&P (Signed)
History and Physical    Karen Perez ENI:[id number] DOB: December 29, 1950 DOA: 09/21/2019  PCP: Lavera Guise, MD Patient coming from: [facility] ED  Chief Complaint: SOB  HPI: 68 year old [facility] SN[facility] resident with a history of 1 L nasal cannula O2 dependent COPD, DM2, HTN, prior CVA, HLD, OSA on CPAP, multiple cerebral aneurysms requiring clipping, and chronic diastolic CHF who was [facility]29 on her baseline oxygen support and returned to [facility] ED with progressive worsening shortness of breath.  In the emergency room she was found to have oxygen saturation of 70% on 2 L of nasal cannula oxygen.  Her blood pressure was stable at presentation.  A chest x-ray in the ED noted bilateral infiltrates worse on the left.  Arrangements were made for the patient to be transferred from [facility] back to the [facility] progressive care unit.  While awaiting transfer to [facility] her oxygen requirements increased from nasal cannula to HFNC, and she was ultimately no longer able to be managed with high flow nasal cannula alone.  She was intubated while still in the ED.  Significant Events: 10/24 - 10/29 admit to [facility] - d/c home  11/5 readmit to [facility] via [facility] ED - intubated in [facility] ED  COVID Specific Tx: Solu-Medrol 10/24 >11/2 Remdesivir 10/24 > 10/28  Assessment/Plan  Acute on chronic hypoxic and hypercarbic respiratory failure Exact etiology unclear -questionable pneumonia versus PE versus COPD exacerbation -vent management per PCCM   Chronic diastolic CHF Physical exam not consistent with acute CHF exacerbation -follow I's and O's and weights  COPD Management per pulmonary -agree with use of steroids and nebulizers  OSA On ventilator at present  HTN Blood pressure currently controlled  DM2 Monitor with sliding scale insulin and frequent CBGs  GERD proton pump inhibitor  HLD  Hx of CVA On plavix and statin long term   Obesity - Estimated body  mass index is 47.69 kg/m as calculated from the following:   Height as of an earlier encounter on 09/21/19: 5\' 5"  (1.651 m).   Weight as of an earlier encounter on 09/21/19: 130 kg.  Incidental thyroid mass  Noted during prior hospital stay -will need follow-up once discharged again  DVT prophylaxis: lovenox  Code Status: FULL Family Communication: Per PCCM Disposition Plan: ICU  Consults called: PCCM  Review of Systems: As per HPI otherwise 10 point review of systems negative.   Past Medical History:  Diagnosis Date  . Arthritis   . Asthma   . Brain aneurysm   . CHF (congestive heart failure) ([place])   . COPD (chronic obstructive pulmonary disease) ([place])   . Diabetes mellitus without complication ([place])   . Hypertension   . Stroke [facility]) 2003    Past Surgical History:  Procedure Laterality Date  . CEREBRAL ANEURYSM REPAIR  2013    Family History  Family History  Problem Relation Age of Onset  . Diabetes Mellitus II Daughter     Social History   reports that she quit smoking about 19 months ago. Her smoking use included cigarettes. She has a 50.00 pack-year smoking history. She has never used smokeless tobacco. She reports that she does not drink alcohol or use drugs.  Allergies Allergies  Allergen Reactions  . Amoxicillin   . Amoxicillin-Pot Clavulanate Diarrhea    Has patient had a PCN reaction causing immediate rash, facial/tongue/throat swelling, SOB or lightheadedness with hypotension: No Has patient had a PCN reaction causing severe rash involving mucus  membranes or skin necrosis: No Has patient had a PCN reaction that required hospitalization: No Has patient had a PCN reaction occurring within the last 10 years: No If all of the above answers are "NO", then may proceed with Cephalosporin use.   . Bacitracin   . Neosporin [Neomycin-Bacitracin Zn-Polymyx] Hives    Prior to Admission medications   Medication Sig Start Date End Date Taking? Authorizing Provider   acetaminophen (TYLENOL) 500 MG tablet Take 1,000 mg by mouth 3 (three) times daily.    [provider]  albuterol (VENTOLIN HFA) 108 (90 Base) MCG/ACT inhaler Inhale 1 puff into the lungs every 6 (six) hours as needed.  04/25/19   [provider]  albuterol (VENTOLIN HFA) 108 (90 Base) MCG/ACT inhaler Inhale 2 puffs into the lungs every 6 (six) hours as needed for wheezing or shortness of breath.    [provider]  atorvastatin (LIPITOR) 20 MG tablet Take 1 tablet (20 mg total) by mouth daily. Patient taking differently: Take 20 mg by mouth daily. At 1800 08/22/18   Lyndon Code, MD  Carboxymethylcellulose Sod PF (REFRESH CELLUVISC) 1 % GEL Apply 1 drop to eye 4 (four) times daily.    [provider]  clopidogrel (PLAVIX) 75 MG tablet Take 1 tablet (75 mg total) by mouth daily. 04/25/18   Carlean Jews, NP  clotrimazole (LOTRIMIN) 1 % cream Apply to affected area 2 times daily 05/11/18   Mesner, Barbara Cower, MD  cyclobenzaprine (FLEXERIL) 5 MG tablet Take 5 mg by mouth 3 (three) times daily as needed.  05/23/19   [provider]  dextromethorphan-guaiFENesin (ROBITUSSIN-DM) 10-100 MG/5ML liquid Take 10 mLs by mouth every 4 (four) hours as needed for cough.    [provider]  diclofenac sodium (VOLTAREN) 1 % GEL Apply 2 g topically 2 (two) times daily. Apply to knees, feet BID    [provider]  docusate sodium (COLACE) 100 MG capsule Take by mouth 2 (two) times daily.     [provider]  fluticasone (FLONASE) 50 MCG/ACT nasal spray Place 1 spray into both nostrils daily.     [provider]  Fluticasone-Salmeterol (ADVAIR) 500-50 MCG/DOSE AEPB Inhale 1 puff into the lungs 2 (two) times daily.    [provider]  furosemide (LASIX) 40 MG tablet Take 1 tablet (40 mg total) by mouth 2 (two) times daily. Patient not taking: Reported on 09/21/2019 04/26/18   Lyndon Code, MD  furosemide (LASIX) 40 MG tablet Take 40 mg by  mouth daily as needed. Give if weight gain is over 5 lbs in a week and/or for edema    [provider]  gabapentin (NEURONTIN) 300 MG capsule Take 900 mg by mouth daily at 8 pm.     [provider]  gabapentin (NEURONTIN) 300 MG capsule Take 300 mg by mouth 2 (two) times daily. At 08:00 and 14:00    [provider]  guaiFENesin (ROBITUSSIN) 100 MG/5ML SOLN Take 10 mLs by mouth every 4 (four) hours as needed for cough or to loosen phlegm.    [provider]  Homeopathic Products [facility] RELIEF) FOAM Apply 1 application topically 2 (two) times daily as needed.    [provider]  lisinopril (PRINIVIL,ZESTRIL) 2.5 MG tablet Take 1 tablet (2.5 mg total) by mouth daily. 03/15/18   Lyndon Code, MD  loratadine (CLARITIN) 10 MG tablet Take 10 mg by mouth daily.    [provider]  Melatonin 5 MG TABS  Take 5 mg by mouth at bedtime.    [provider]  metFORMIN (GLUCOPHAGE) 500 MG tablet Take 500 mg by mouth 2 (two) times daily with a meal.     [provider]  methylPREDNISolone (MEDROL DOSEPAK) 4 MG TBPK tablet follow package directions 09/14/19   Leroy SeaSingh, Prashant K, MD  mirabegron ER (MYRBETRIQ) 25 MG TB24 tablet Take 25 mg by mouth daily.    [provider]  pantoprazole (PROTONIX) 20 MG tablet Take 20 mg by mouth daily as needed.     [provider]  potassium chloride (K-DUR) 10 MEQ tablet TAKE 2 TABLETS BY MOUTH EVERY DAY Patient taking differently: Take 20 mEq by mouth daily.  05/20/18   Lyndon CodeKhan, Fozia M, MD  potassium chloride SA (K-DUR) 20 MEQ tablet  05/30/19   [provider]  RESTASIS 0.05 % ophthalmic emulsion Place 1 drop into both eyes 2 (two) times daily.  07/11/19   [provider]  solifenacin (VESICARE) 5 MG tablet Take 1 tablet (5 mg total) by mouth daily. 06/14/19 06/13/20  Jerilee FieldEskridge, Matthew, MD  tiotropium (SPIRIVA HANDIHALER) 18 MCG inhalation capsule Use once day 03/23/18   Lyndon CodeKhan, Fozia  M, MD  traMADol Janean Sark(ULTRAM) 50 MG tablet Take 0.5 tablets (25 mg total) by mouth every 12 (twelve) hours as needed for moderate pain. 09/14/19   Leroy SeaSingh, Prashant K, MD  traZODone (DESYREL) 50 MG tablet Take 25 mg by mouth daily at 8 pm.  06/10/19   [provider]    Physical Exam: Vitals:   09/21/19 1505  Pulse: 85  Resp: (!) 26  SpO2: 99%    Constitutional: Intubated on ventilator ENMT: Mucous membranes are moist. Posterior pharynx clear of any exudate or lesions. Neck: no masses, no thyromegaly Respiratory: No audible wheezing -faint basilar crackles Cardiovascular: Regular rate and rhythm, no murmurs / rubs / gallops. No extremity edema. 2+ pedal pulses. No carotid bruits.  Abdomen: Morbidly obese, soft, no rebound, no appreciable mass Musculoskeletal: no clubbing / cyanosis. No joint deformity upper and lower extremities.  Skin: no rashes, lesions, ulcers. No induration Neurologic: CN 2-12 grossly intact.  Psychiatric: Unable to obtain as patient sedated on ventilator   Labs on Admission:   CBC: Recent Labs  Lab 09/21/19 0601  WBC 7.4  NEUTROABS 5.9  HGB 12.5  HCT 45.1  MCV 99.8  PLT 182   Basic Metabolic Panel: Recent Labs  Lab 09/21/19 0601  NA 142  K 4.7  CL 96*  CO2 35*  GLUCOSE 116*  BUN 16  CREATININE 0.72  CALCIUM 8.9   GFR: Estimated Creatinine Clearance: 90.3 mL/min (by C-G formula based on SCr of 0.72 mg/dL).  Urine analysis:    Component Value Date/Time   COLORURINE YELLOW (A) 09/09/2019 0556   APPEARANCEUR HAZY (A) 09/09/2019 0556   LABSPEC 1.017 09/09/2019 0556   PHURINE 6.0 09/09/2019 0556   GLUCOSEU NEGATIVE 09/09/2019 0556   HGBUR NEGATIVE 09/09/2019 0556   HGBUR large 02/07/2009 0000   BILIRUBINUR NEGATIVE 09/09/2019 0556   KETONESUR NEGATIVE 09/09/2019 0556   PROTEINUR NEGATIVE 09/09/2019 0556   UROBILINOGEN negative 02/07/2009 0000   NITRITE NEGATIVE 09/09/2019 0556   LEUKOCYTESUR NEGATIVE 09/09/2019 0556     Radiological Exams on Admission: Dg Chest Portable 1 View  Result Date: 09/21/2019 CLINICAL DATA:  S/p intubations/p ETT and OG EXAM: PORTABLE CHEST 1 VIEW COMPARISON:  Radiograph 09/21/2019 FINDINGS: Endotracheal tube placed 5 cm from carina. NG tube extends into the stomach. Stable enlarged cardiac silhouette.  Airspace disease in the RIGHT upper lobe is similar. Fluid along the RIGHT fissure. Central venous congestion. No pneumothorax. IMPRESSION: 1. Endotracheal tube 5 cm from carina. 2. No change in RIGHT upper lobe pneumonia. 3. Central venous congestion. Electronically Signed   By: Genevive Bi M.D.   On: 09/21/2019 10:32   Dg Chest Port 1 View  Result Date: 09/21/2019 CLINICAL DATA:  Respiratory distress EXAM: PORTABLE CHEST 1 VIEW COMPARISON:  09/09/2019 FINDINGS: There are hazy airspace opacities bilaterally with more focal consolidation involving the right upper lobe. The lung volumes are low. There is no clear pneumothorax. No large pleural effusion. There is elevation of the right hemidiaphragm. Prominent interstitial lung markings are noted. There is cardiomegaly. IMPRESSION: 1. Persistent bilateral hazy airspace opacities with more focal consolidation in the right upper lobe. Findings are consistent with the patient's known diagnosis of viral pneumonia. 2. Cardiomegaly with prominent interstitial lung markings. This may be secondary to volume overload or the patient's known atypical infectious process. Electronically Signed   By: Katherine Mantle M.D.   On: 09/21/2019 06:14   Dg Abd Portable 1 View  Result Date: 09/21/2019 CLINICAL DATA:  Nasogastric tube placement EXAM: PORTABLE ABDOMEN - 1 VIEW COMPARISON:  None. FINDINGS: The enteric tube tip and side-port are over the proximal stomach. Retrocardiac opacity. There is contemporaneous chest x-ray. Upper abdominal bowel gas pattern is normal. IMPRESSION: Enteric tube with tip at the proximal stomach. Electronically Signed   By:  Marnee Spring M.D.   On: 09/21/2019 10:31    Lonia Blood, MD [facility] Office  [phone] Pager - Text Page per Amion as per below:  On-Call/Text Page:      [email or web address]  If 7PM-7AM, please contact night-coverage [email or web address] 09/21/2019, 3:35 PM

## 2019-09-21 NOTE — Progress Notes (Signed)
Pt arrived to [facility] [id number] via [facility] from [facility] ED. Pt placed on vent and ETT remains secure at 23cm at the lip. RT will continue to monitor.

## 2019-09-21 NOTE — ED Provider Notes (Signed)
[facility] Emergency Department Provider Note  ____________________________________________   First MD Initiated Contact with Patient 09/21/19 [phone]     (approximate)  I have reviewed the triage vital signs and the nursing notes.   HISTORY  Chief Complaint Shortness of Breath   HPI Karen Perez is a 68 y.o. female with below list of previous medical conditions including recently diagnosed COVID-19 on September 09, 2019 with resultant admission to [facility] presents to the emergency department tonight [facility] secondary to hypoxemia and respiratory distress.  EMS states on their arrival patient's oxygen saturation 80% on baseline 3 L via nasal cannula.  EMS states that they increased the patient's oxygen to 4 L with resultant O2 saturation of 92 percent.  Patient presents to the emergency department currently satting 92% on 4 L.        Past Medical History:  Diagnosis Date  . Arthritis   . Asthma   . Brain aneurysm   . CHF (congestive heart failure) (HCC)   . COPD (chronic obstructive pulmonary disease) (HCC)   . Diabetes mellitus without complication (HCC)   . Hypertension   . Stroke [facility](HCC) 2003    Patient Active Problem List   Diagnosis Date Noted  . Acute hypoxemic respiratory failure due to COVID-19 (HCC) 09/09/2019  . Chronic diastolic heart failure (HCC) 07/08/2018  . Abnormality of gait 07/05/2018  . Acute respiratory failure with hypoxia (HCC) 02/22/2018  . COPD with acute exacerbation (HCC) 02/22/2018  . Diabetes mellitus type 2 in obese (HCC) 02/22/2018  . Acute respiratory failure with hypoxia and hypercapnia (HCC) 02/22/2018  . HOT FLASHES 05/02/2010  . DIABETES MELLITUS, TYPE II 01/24/2010  . SKIN TAG 10/29/2009  . KNEE PAIN, BILATERAL 10/04/2009  . CHRONIC RHINITIS 08/01/2009  . VOCAL CORD DISORDER 08/01/2009  . GERD 08/01/2009  . HYPOXEMIA 05/21/2009  . Nonspecific (abnormal) findings on  radiological and other examination of body structure 05/16/2009  . ABNORMAL CHEST XRAY 05/16/2009  . OBESITY, MORBID 04/30/2009  . TOBACCO USER 04/30/2009  . TINEA CORPORIS 04/19/2009  . HEMOCCULT POSITIVE STOOL 02/07/2009  . LEG CRAMPS 12/24/2008  . DERMATITIS, ALLERGIC 06/28/2008  . BACK PAIN, LUMBAR 05/04/2008  . OBSTRUCTIVE SLEEP APNEA 10/17/2007  . Essential hypertension 04/20/2007  . HYPERLIPIDEMIA 03/21/2007  . DEPRESSION 03/21/2007  . CEREBRAL ANEURYSM 03/21/2007  . COPD (chronic obstructive pulmonary disease) (HCC) 03/21/2007  . ARTHRITIS 03/21/2007  . DVT, HX OF 03/21/2007    Past Surgical History:  Procedure Laterality Date  . CEREBRAL ANEURYSM REPAIR  2013    Prior to Admission medications   Medication Sig Start Date End Date Taking? Authorizing Provider  acetaminophen (TYLENOL) 500 MG tablet Take 1,000 mg by mouth 3 (three) times daily.    [provider]  albuterol (VENTOLIN HFA) 108 (90 Base) MCG/ACT inhaler Inhale 1 puff into the lungs every 6 (six) hours as needed.  04/25/19   [provider]  albuterol (VENTOLIN HFA) 108 (90 Base) MCG/ACT inhaler Inhale 2 puffs into the lungs every 6 (six) hours as needed for wheezing or shortness of breath.    [provider]  atorvastatin (LIPITOR) 20 MG tablet Take 1 tablet (20 mg total) by mouth daily. Patient taking differently: Take 20 mg by mouth daily. At 1800 08/22/18   Lyndon CodeKhan, Fozia M, MD  Carboxymethylcellulose Sod PF (REFRESH CELLUVISC) 1 % GEL Apply 1 drop to eye 4 (four) times daily.    [provider]  clopidogrel (  PLAVIX) 75 MG tablet Take 1 tablet (75 mg total) by mouth daily. 04/25/18   Carlean Jews, NP  clotrimazole (LOTRIMIN) 1 % cream Apply to affected area 2 times daily 05/11/18   Mesner, Barbara Cower, MD  cyclobenzaprine (FLEXERIL) 5 MG tablet Take 5 mg by mouth 3 (three) times daily as needed.  05/23/19   [provider]  dextromethorphan-guaiFENesin (ROBITUSSIN-DM) 10-100  MG/5ML liquid Take 10 mLs by mouth every 4 (four) hours as needed for cough.    [provider]  diclofenac sodium (VOLTAREN) 1 % GEL Apply 2 g topically 2 (two) times daily. Apply to knees, feet BID    [provider]  docusate sodium (COLACE) 100 MG capsule Take by mouth 2 (two) times daily.     [provider]  fluticasone (FLONASE) 50 MCG/ACT nasal spray Place 1 spray into both nostrils daily.     [provider]  Fluticasone-Salmeterol (ADVAIR) 500-50 MCG/DOSE AEPB Inhale 1 puff into the lungs 2 (two) times daily.    [provider]  furosemide (LASIX) 40 MG tablet Take 1 tablet (40 mg total) by mouth 2 (two) times daily. Patient taking differently: Take 40 mg by mouth daily.  04/26/18   Lyndon Code, MD  furosemide (LASIX) 40 MG tablet Take 40 mg by mouth daily as needed. Give if weight gain is over 5 lbs in a week and/or for edema    [provider]  gabapentin (NEURONTIN) 300 MG capsule Take 900 mg by mouth daily at 8 pm.     [provider]  gabapentin (NEURONTIN) 300 MG capsule Take 300 mg by mouth 2 (two) times daily. At 08:00 and 14:00    [provider]  guaiFENesin (ROBITUSSIN) 100 MG/5ML SOLN Take 10 mLs by mouth every 4 (four) hours as needed for cough or to loosen phlegm.    [provider]  Homeopathic Products [facility] RELIEF) FOAM Apply 1 application topically 2 (two) times daily as needed.    [provider]  lisinopril (PRINIVIL,ZESTRIL) 2.5 MG tablet Take 1 tablet (2.5 mg total) by mouth daily. 03/15/18   Lyndon Code, MD  loratadine (CLARITIN) 10 MG tablet Take 10 mg by mouth daily.    [provider]  Melatonin 5 MG TABS Take 5 mg by mouth at bedtime.    [provider]  metFORMIN (GLUCOPHAGE) 500 MG tablet Take 500 mg by mouth 2 (two) times daily with a meal.     [provider]  methylPREDNISolone (MEDROL DOSEPAK) 4 MG TBPK tablet follow package directions  09/14/19   Leroy Sea, MD  mirabegron ER (MYRBETRIQ) 25 MG TB24 tablet Take 25 mg by mouth daily.    [provider]  pantoprazole (PROTONIX) 20 MG tablet Take 20 mg by mouth daily as needed.     [provider]  potassium chloride (K-DUR) 10 MEQ tablet TAKE 2 TABLETS BY MOUTH EVERY DAY Patient taking differently: Take 20 mEq by mouth daily.  05/20/18   Lyndon Code, MD  potassium chloride SA (K-DUR) 20 MEQ tablet  05/30/19   [provider]  RESTASIS 0.05 % ophthalmic emulsion Place 1 drop into both eyes 2 (two) times daily.  07/11/19   [provider]  solifenacin (VESICARE) 5 MG tablet Take 1 tablet (5 mg total) by mouth daily. 06/14/19 06/13/20  Jerilee Field, MD  tiotropium (SPIRIVA HANDIHALER) 18 MCG inhalation capsule Use once day 03/23/18   Lyndon Code, MD  traMADol Janean Sark)  50 MG tablet Take 0.5 tablets (25 mg total) by mouth every 12 (twelve) hours as needed for moderate pain. 09/14/19   Leroy Sea, MD  traZODone (DESYREL) 50 MG tablet Take 25 mg by mouth daily at 8 pm.  06/10/19   [provider]    Allergies Amoxicillin, Amoxicillin-pot clavulanate, Bacitracin, and Neosporin [neomycin-bacitracin zn-polymyx]  Family History  Problem Relation Age of Onset  . Diabetes Mellitus II Daughter     Social History Social History   Tobacco Use  . Smoking status: Former Smoker    Packs/day: 1.00    Years: 50.00    Pack years: 50.00    Types: Cigarettes    Quit date: 02/15/2018    Years since quitting: 1.5  . Smokeless tobacco: Never Used  Substance Use Topics  . Alcohol use: No  . Drug use: No    Review of Systems Constitutional: No fever/chills Eyes: No visual changes. ENT: No sore throat. Cardiovascular: Denies chest pain. Respiratory: Positive for dyspnea Gastrointestinal: No abdominal pain.  No nausea, no vomiting.  No diarrhea.  No constipation. Genitourinary: Negative for dysuria. Musculoskeletal: Negative for  neck pain.  Negative for back pain. Integumentary: Negative for rash. Neurological: Negative for headaches, focal weakness or numbness.   ____________________________________________   PHYSICAL EXAM:  VITAL SIGNS: ED Triage Vitals  Enc Vitals Group     BP 09/21/19 0555 134/85     Pulse Rate 09/21/19 0551 98     Resp 09/21/19 0555 (!) 26     Temp 09/21/19 0551 99.3 F (37.4 C)     Temp src --      SpO2 09/21/19 0550 92 %     Weight 09/21/19 0552 130 kg (286 lb 9.6 oz)     Height 09/21/19 0552 1.651 m (5\' 5" )     Head Circumference --      Peak Flow --      Pain Score 09/21/19 0552 0     Pain Loc --      Pain Edu? --      Excl. in GC? --     Constitutional: Alert .  Apparent respiratory difficulty. Eyes: Conjunctivae are normal.  Head: Atraumatic. Mouth/Throat: Patient is wearing a mask. Neck: No stridor.  No meningeal signs.   Cardiovascular: Normal rate, regular rhythm. Good peripheral circulation. Grossly normal heart sounds. Respiratory: Tachypnea, diffuse rhonchi Gastrointestinal: Soft and nontender. No distention.   Musculoskeletal: No lower extremity tenderness nor edema. No gross deformities of extremities. Neurologic:  Normal speech and language. No gross focal neurologic deficits are appreciated.  Skin:  Skin is warm, dry and intact. Psychiatric: Mood and affect are normal. Speech and behavior are normal.  ____________________________________________   LABS (all labs ordered are listed, but only abnormal results are displayed)  Labs Reviewed  CBC WITH DIFFERENTIAL/PLATELET - Abnormal; Notable for the following components:      Result Value   MCHC 27.7 (*)    RDW 16.4 (*)    nRBC 0.3 (*)    All other components within normal limits  BASIC METABOLIC PANEL - Abnormal; Notable for the following components:   Chloride 96 (*)    CO2 35 (*)    Glucose, Bld 116 (*)    All other components within normal limits  BRAIN NATRIURETIC PEPTIDE  BLOOD GAS, VENOUS   TROPONIN I (HIGH SENSITIVITY)   ____________________________________________  EKG  ED ECG REPORT I, Gooding N BROWN, the attending physician, personally viewed and interpreted this ECG.  Date: 09/21/2019  EKG Time: 5:54 AM  Rate:96  Rhythm: Normal sinus rhythm  Axis: Normal  Intervals: Normal  ST&T Change: None  ____________________________________________  RADIOLOGY I, Dunean N BROWN, personally viewed and evaluated these images (plain radiographs) as part of my medical decision making, as well as reviewing the written report by the radiologist.  ED MD interpretation: Persistent bilateral hazy airspace opacities with a focal consolidation in the right upper lobe.  Official radiology report(s): Dg Chest Port 1 View  Result Date: 09/21/2019 CLINICAL DATA:  Respiratory distress EXAM: PORTABLE CHEST 1 VIEW COMPARISON:  09/09/2019 FINDINGS: There are hazy airspace opacities bilaterally with more focal consolidation involving the right upper lobe. The lung volumes are low. There is no clear pneumothorax. No large pleural effusion. There is elevation of the right hemidiaphragm. Prominent interstitial lung markings are noted. There is cardiomegaly. IMPRESSION: 1. Persistent bilateral hazy airspace opacities with more focal consolidation in the right upper lobe. Findings are consistent with the patient's known diagnosis of viral pneumonia. 2. Cardiomegaly with prominent interstitial lung markings. This may be secondary to volume overload or the patient's known atypical infectious process. Electronically Signed   By: Constance Holster M.D.   On: 09/21/2019 06:14    ____________________________________________   PROCEDURES   .Critical Care Performed by: Gregor Hams, MD Authorized by: Gregor Hams, MD   Critical care provider statement:    Critical care time (minutes):  45   Critical care time was exclusive of:  Separately billable procedures and treating other  patients   Critical care was necessary to treat or prevent imminent or life-threatening deterioration of the following conditions:  Respiratory failure   Critical care was time spent personally by me on the following activities:  Development of treatment plan with patient or surrogate, discussions with consultants, evaluation of patient's response to treatment, examination of patient, obtaining history from patient or surrogate, ordering and performing treatments and interventions, ordering and review of laboratory studies, ordering and review of radiographic studies, pulse oximetry, re-evaluation of patient's condition and review of old charts     ____________________________________________   INITIAL IMPRESSION / MDM / [place] / ED COURSE  As part of my medical decision making, I reviewed the following data within the electronic MEDICAL RECORD NUMBER  68 year old female presenting with above-stated history and physical exam secondary to respiratory distress.  Concern for possible worsening COVID-19 infection versus COPD exacerbation, superimposed bacterial pneumonia.  Chest x-ray consistent with COVID-19 with a new area of focal consolidation in the right upper lobe.  Patient's oxygen saturation decreased to 75% on 5 L nasal cannula and as such high flow oxygen was applied.  Patient given Decadron 10 mg IV.  Patient given ceftriaxone 1 g and azithromycin 500 mg IV.  Plan to transfer the patient to [name] for further management awaiting call from hospitalist at this time.  Patient's care transferred to Dr. William Hamburger patient's current oxygen saturation 91% on high flow oxygen.  __________________________  FINAL CLINICAL IMPRESSION(S) / ED DIAGNOSES  Final diagnoses:  Acute respiratory failure with hypoxia ([place])     MEDICATIONS GIVEN DURING THIS VISIT:  Medications  cefTRIAXone (ROCEPHIN) 1 g in sodium chloride 0.9 % 100 mL IVPB (1 g Intravenous New Bag/Given 09/21/19  0701)  azithromycin (ZITHROMAX) 500 mg in sodium chloride 0.9 % 250 mL IVPB (has no administration in time range)  ipratropium-albuterol (DUONEB) 0.5-2.5 (3) MG/3ML nebulizer solution 3 mL (3 mLs Nebulization Given 09/21/19 0609)  ipratropium-albuterol (DUONEB)  0.5-2.5 (3) MG/3ML nebulizer solution 3 mL (3 mLs Nebulization Given 09/21/19 0609)  dexamethasone (DECADRON) injection 10 mg (10 mg Intravenous Given 09/21/19 1610)     ED Discharge Orders    None      *Please note:  Karen Perez was evaluated in Emergency Department on 09/21/2019 for the symptoms described in the history of present illness. She was evaluated in the context of the global COVID-19 pandemic, which necessitated consideration that the patient might be at risk for infection with the SARS-CoV-2 virus that causes COVID-19. Institutional protocols and algorithms that pertain to the evaluation of patients at risk for COVID-19 are in a state of rapid change based on information released by regulatory bodies including the CDC and federal and state organizations. These policies and algorithms were followed during the patient's care in the ED.  Some ED evaluations and interventions may be delayed as a result of limited staffing during the pandemic.*  Note:  This document was prepared using Dragon voice recognition software and may include unintentional dictation errors.   Darci Current, MD 09/21/19 [phone]

## 2019-09-21 NOTE — Progress Notes (Signed)
ABG results given to Dr. Lake Bells. Verbal order received to not prone pt, decrease peep and FiO2 as tolerated keeping SpO2 greater than 85% and to obtain ABG in AM. RT will continue to monitor.

## 2019-09-21 NOTE — Progress Notes (Signed)
NAME:  Karen PolingLinda J Perez, MRN:  [id number], DOB:  05/06/1951, LOS: 0 ADMISSION DATE:  09/21/2019, CONSULTATION DATE:  11/5 REFERRING MD:  [name], CHIEF COMPLAINT:  Dyspnea   Brief History   68 y/o female recently discharged from this hospital after treatment for COVID 19 pneumonia was re-admitted on 11/5 with hypercarbic respiratory failure requiring intubation and mechanical ventilation.  History of present illness   This is a 68 year old female with a past medical history significant for obstructive sleep apnea and COPD among multiple other problems who was readmitted today after she required intubation in the [facility] emergency room.  She presented there with shortness of breath and hypoxemia.  She had been treated with high flow nasal cannula.  Her mental status declined and she became somnolent, falling asleep and desaturating so she was intubated.  A PCO2 on a venous blood gas was noted to be greater than 100.  She was transferred to our facility intubated and sedated so no further history could be obtained.  Her daughter says she was wheezing for at least one week prior to admission. She feels it was worsening.  Past Medical History  Stroke Hypertension Diabetes mellitus COPD CHF Asthma Arthritis  Significant Hospital Events   October 29 [facility] after being treated for Covid pneumonia with Decadron and remdesivir November 5 admission  Consults:  Pulmonary and critical care medicine  Procedures:  11/5 ETT >   Significant Diagnostic Tests:    Micro Data:  10/24 SARS COV 2 > positive 11/5 resp culture >   Antimicrobials:  10/24 remdesivir > 10/28 11/5 ceftriaxone >   11/5 azithro >   Interim history/subjective:   As above  Objective   Blood pressure 120/78, pulse 68, temperature 99 F (37.2 C), temperature source Axillary, resp. rate (!) 26, SpO2 99 %.    Vent Mode: PRVC FiO2 (%):  [100 %] 100 % Set Rate:  [22 bmp-26 bmp] 26 bmp  Vt Set:  [450 mL-500 mL] 450 mL PEEP:  [10 cmH20-12 cmH20] 12 cmH20 Plateau Pressure:  [28 cmH20] 28 cmH20  No intake or output data in the 24 hours ending 09/21/19 1720 There were no vitals filed for this visit.  Examination:  General:  In bed on vent HENT: NCAT ETT in place PULM: CTA B, vent supported breathing CV: RRR, no mgr GI: BS+, soft, nontender MSK: normal bulk and tone Neuro: sedated on vent  11/5 CXR images reviewed > RUL infiltrate, ett in place  Resolved Hospital Problem list     Assessment & Plan:  Acute hypercarbic respiratory failure: I think this may have been precipitated by hypoxemia, complicated by pulmonary hypertension, sleep apnea, COPD exacerbation. Full mechanical ventilatory support As she does not appear to have ARDS we will use conventional ventilator protocol Wean PEEP and FiO2 overnight Start DuoNeb scheduled VAP prevention SaO2 goal > 85%  COPD, acute exacerbation Duoneb scheduled brovana/pulmicort  OSA Sleep apnea  Pneumonia, at risk for pseudomonas given recent hospitalization, steroids, SNF: Cefepime per pharmacy resp culture  Need for sedation for mechanical ventilation Propofol, fentanyl per PAD protocol RASS goal -1   Best practice:  Diet: start tube feeding Pain/Anxiety/Delirium protocol (if indicated): as above VAP protocol (if indicated): yes DVT prophylaxis: lovenox GI prophylaxis: famotidine Glucose control: SSI Mobility: bed rest Code Status: full Family Communication: I called her daughter [name] today Disposition: remain in ICU  Labs   CBC: Recent Labs  Lab 09/21/19 0601 09/21/19 1650  WBC 7.4  --  NEUTROABS 5.9  --   HGB 12.5 13.9  HCT 45.1 41.0  MCV 99.8  --   PLT 182  --     Basic Metabolic Panel: Recent Labs  Lab 09/21/19 0601 09/21/19 1650  NA 142 139  K 4.7 4.0  CL 96*  --   CO2 35*  --   GLUCOSE 116*  --   BUN 16  --   CREATININE 0.72  --   CALCIUM 8.9  --    GFR: Estimated  Creatinine Clearance: 90.3 mL/min (by C-G formula based on SCr of 0.72 mg/dL). Recent Labs  Lab 09/21/19 0601 09/21/19 0819  PROCALCITON  --  <0.10  WBC 7.4  --     Liver Function Tests: No results for input(s): AST, ALT, ALKPHOS, BILITOT, PROT, ALBUMIN in the last 168 hours. No results for input(s): LIPASE, AMYLASE in the last 168 hours. No results for input(s): AMMONIA in the last 168 hours.  ABG    Component Value Date/Time   PHART 7.481 (H) 09/21/2019 1650   PCO2ART 52.8 (H) 09/21/2019 1650   PO2ART 118.0 (H) 09/21/2019 1650   HCO3 39.3 (H) 09/21/2019 1650   TCO2 41 (H) 09/21/2019 1650   O2SAT 99.0 09/21/2019 1650     Coagulation Profile: No results for input(s): INR, PROTIME in the last 168 hours.  Cardiac Enzymes: No results for input(s): CKTOTAL, CKMB, CKMBINDEX, TROPONINI in the last 168 hours.  HbA1C: Hemoglobin A1C  Date/Time Value Ref Range Status  02/23/2018 02:50 PM 5.6  Final   Hgb A1c MFr Bld  Date/Time Value Ref Range Status  09/10/2019 02:09 AM 6.6 (H) 4.8 - 5.6 % Final    Comment:    (NOTE) Pre diabetes:          5.7%-6.4% Diabetes:              >6.4% Glycemic control for   <7.0% adults with diabetes   10/23/2010 11:37 AM 5.9 4.6 - 6.5 % Final    Comment:    See lab report for associated comment(s)    CBG: No results for input(s): GLUCAP in the last 168 hours.  Review of Systems: PMH/SH/SH/FH  Cannot obtain due to intubation  Allergies Allergies  Allergen Reactions  . Amoxicillin   . Amoxicillin-Pot Clavulanate Diarrhea    Has patient had a PCN reaction causing immediate rash, facial/tongue/throat swelling, SOB or lightheadedness with hypotension: No Has patient had a PCN reaction causing severe rash involving mucus membranes or skin necrosis: No Has patient had a PCN reaction that required hospitalization: No Has patient had a PCN reaction occurring within the last 10 years: No If all of the above answers are "NO", then may proceed  with Cephalosporin use.   . Bacitracin   . Neosporin [Neomycin-Bacitracin Zn-Polymyx] Hives     Home Medications  Prior to Admission medications   Medication Sig Start Date End Date Taking? Authorizing Provider  acetaminophen (TYLENOL) 500 MG tablet Take 1,000 mg by mouth 3 (three) times daily.    [provider]  albuterol (VENTOLIN HFA) 108 (90 Base) MCG/ACT inhaler Inhale 1 puff into the lungs every 6 (six) hours as needed.  04/25/19   [provider]  albuterol (VENTOLIN HFA) 108 (90 Base) MCG/ACT inhaler Inhale 2 puffs into the lungs every 6 (six) hours as needed for wheezing or shortness of breath.    [provider]  atorvastatin (LIPITOR) 20 MG tablet Take 1 tablet (20 mg total) by mouth daily. Patient taking differently:  Take 20 mg by mouth daily. At 1800 08/22/18   Lavera Guise, MD  Carboxymethylcellulose Sod PF (REFRESH CELLUVISC) 1 % GEL Apply 1 drop to eye 4 (four) times daily.    [provider]  clopidogrel (PLAVIX) 75 MG tablet Take 1 tablet (75 mg total) by mouth daily. 04/25/18   Ronnell Freshwater, NP  clotrimazole (LOTRIMIN) 1 % cream Apply to affected area 2 times daily 05/11/18   Mesner, Corene Cornea, MD  cyclobenzaprine (FLEXERIL) 5 MG tablet Take 5 mg by mouth 3 (three) times daily as needed.  05/23/19   [provider]  dextromethorphan-guaiFENesin (ROBITUSSIN-DM) 10-100 MG/5ML liquid Take 10 mLs by mouth every 4 (four) hours as needed for cough.    [provider]  diclofenac sodium (VOLTAREN) 1 % GEL Apply 2 g topically 2 (two) times daily. Apply to knees, feet BID    [provider]  docusate sodium (COLACE) 100 MG capsule Take by mouth 2 (two) times daily.     [provider]  fluticasone (FLONASE) 50 MCG/ACT nasal spray Place 1 spray into both nostrils daily.     [provider]  Fluticasone-Salmeterol (ADVAIR) 500-50 MCG/DOSE AEPB Inhale 1 puff into the lungs 2 (two) times daily.    [provider]  furosemide (LASIX) 40 MG tablet Take 1 tablet (40 mg total) by mouth 2 (two) times daily. Patient not taking: Reported on 09/21/2019 04/26/18   Lavera Guise, MD  furosemide (LASIX) 40 MG tablet Take 40 mg by mouth daily as needed. Give if weight gain is over 5 lbs in a week and/or for edema    [provider]  gabapentin (NEURONTIN) 300 MG capsule Take 900 mg by mouth daily at 8 pm.     [provider]  gabapentin (NEURONTIN) 300 MG capsule Take 300 mg by mouth 2 (two) times daily. At 08:00 and 14:00    [provider]  guaiFENesin (ROBITUSSIN) 100 MG/5ML SOLN Take 10 mLs by mouth every 4 (four) hours as needed for cough or to loosen phlegm.    [provider]  Homeopathic Products [facility] RELIEF) FOAM Apply 1 application topically 2 (two) times daily as needed.    [provider]  lisinopril (PRINIVIL,ZESTRIL) 2.5 MG tablet Take 1 tablet (2.5 mg total) by mouth daily. 03/15/18   Lavera Guise, MD  loratadine (CLARITIN) 10 MG tablet Take 10 mg by mouth daily.    [provider]  Melatonin 5 MG TABS Take 5 mg by mouth at bedtime.    [provider]  metFORMIN (GLUCOPHAGE) 500 MG tablet Take 500 mg by mouth 2 (two) times daily with a meal.     [provider]  methylPREDNISolone (MEDROL DOSEPAK) 4 MG TBPK tablet follow package directions 09/14/19   Thurnell Lose, MD  mirabegron ER (MYRBETRIQ) 25 MG TB24 tablet Take 25 mg by mouth daily.    [provider]  pantoprazole (PROTONIX) 20 MG tablet Take 20 mg by mouth daily as needed.     [provider]  potassium chloride (K-DUR) 10 MEQ tablet TAKE 2 TABLETS BY MOUTH EVERY DAY Patient taking differently: Take 20 mEq by mouth daily.  05/20/18   Lavera Guise, MD  potassium chloride SA (K-DUR) 20 MEQ tablet  05/30/19   [provider]  RESTASIS 0.05 % ophthalmic emulsion Place 1 drop into both eyes 2 (two) times daily.  07/11/19   [provider]  solifenacin (VESICARE) 5 MG  tablet Take 1 tablet (5 mg total) by mouth daily. 06/14/19 06/13/20  Jerilee Field, MD  tiotropium (SPIRIVA HANDIHALER) 18 MCG inhalation capsule Use once day 03/23/18   Lyndon Code, MD  traMADol Janean Sark) 50 MG tablet Take 0.5 tablets (25 mg total) by mouth every 12 (twelve) hours as needed for moderate pain. 09/14/19   Leroy Sea, MD  traZODone (DESYREL) 50 MG tablet Take 25 mg by mouth daily at 8 pm.  06/10/19   [provider]     Critical care time: 60 minutes     Heber [place], MD [place] [facility] Pager: [phone] Cell: [phone] If no response, call [phone]

## 2019-09-21 NOTE — Progress Notes (Signed)
Pharmacy Antibiotic Note  Karen Perez is a 68 y.o. female recently discharged after treatment for COVID 19 pneumonia, now re-admitted on 09/21/2019 with respiratory failure requiring intubation.  Pharmacy has been consulted for cefepime dosing for HCAP.  Plan: Cefepime 2g IV q8h Follow up renal function & cultures, clinical progress   Temp (24hrs), Avg:99.4 F (37.4 C), Min:99 F (37.2 C), Max:100.3 F (37.9 C)  Recent Labs  Lab 09/21/19 0601  WBC 7.4  CREATININE 0.72    Estimated Creatinine Clearance: 90.3 mL/min (by C-G formula based on SCr of 0.72 mg/dL).    Allergies  Allergen Reactions  . Amoxicillin   . Amoxicillin-Pot Clavulanate Diarrhea    Has patient had a PCN reaction causing immediate rash, facial/tongue/throat swelling, SOB or lightheadedness with hypotension: No Has patient had a PCN reaction causing severe rash involving mucus membranes or skin necrosis: No Has patient had a PCN reaction that required hospitalization: No Has patient had a PCN reaction occurring within the last 10 years: No If all of the above answers are "NO", then may proceed with Cephalosporin use.   . Bacitracin   . Neosporin [Neomycin-Bacitracin Zn-Polymyx] Hives    Antimicrobials this admission: 11/5 Rocephin/Zithro x 1 11/5 Cefepime >>  Dose adjustments this admission:  Microbiology results: 11/5 MRSA PCR: 11/5 Sputum:  Thank you for allowing pharmacy to be a part of this patient's care.  Peggyann Juba, PharmD, BCPS Pharmacy: [phone] 09/21/2019 6:06 PM

## 2019-09-21 NOTE — ED Notes (Signed)
Spoke and updated Karen Perez daughter via phone

## 2019-09-21 NOTE — ED Notes (Signed)
Pt seems more relaxed after increase in propofol, awaiting transport at this time, will ctm

## 2019-09-21 NOTE — ED Triage Notes (Signed)
Pt to ED via EMS from [facility], pt arrives covid positive, per ems pt wears o2 chronically at 3L pts sats were in the 80s on 3L on ems arrival to facility. Pt on 4L at 92%, pt denies any SOB or worsening symptoms.

## 2019-09-21 NOTE — ED Notes (Signed)
Patient has had multiple episodes of desating and we are now intubathing.

## 2019-09-21 NOTE — ED Notes (Addendum)
Pt unable to sign for transport due to intubation, daughter informed she is being transported now

## 2019-09-21 NOTE — ED Notes (Signed)
Patient tries to lift arms at times, so I applied some mitts to hands.

## 2019-09-22 ENCOUNTER — Inpatient Hospital Stay (HOSPITAL_COMMUNITY): Payer: Medicare Other

## 2019-09-22 DIAGNOSIS — J9601 Acute respiratory failure with hypoxia: Secondary | ICD-10-CM

## 2019-09-22 DIAGNOSIS — R7989 Other specified abnormal findings of blood chemistry: Secondary | ICD-10-CM

## 2019-09-22 LAB — COMPREHENSIVE METABOLIC PANEL
ALT: 13 U/L (ref 0–44)
AST: 20 U/L (ref 15–41)
Albumin: 2.7 g/dL — ABNORMAL LOW (ref 3.5–5.0)
Alkaline Phosphatase: 44 U/L (ref 38–126)
Anion gap: 14 (ref 5–15)
BUN: 18 mg/dL (ref 8–23)
CO2: 32 mmol/L (ref 22–32)
Calcium: 8.6 mg/dL — ABNORMAL LOW (ref 8.9–10.3)
Chloride: 93 mmol/L — ABNORMAL LOW (ref 98–111)
Creatinine, Ser: 0.81 mg/dL (ref 0.44–1.00)
GFR calc Af Amer: >60 mL/min (ref >60)
GFR calc non Af Amer: >60 mL/min (ref >60)
Glucose, Bld: 190 mg/dL — ABNORMAL HIGH (ref 70–99)
Potassium: 3.7 mmol/L (ref 3.5–5.1)
Sodium: 139 mmol/L (ref 135–145)
Total Bilirubin: 0.4 mg/dL (ref 0.3–1.2)
Total Protein: 6.6 g/dL (ref 6.5–8.1)

## 2019-09-22 LAB — CBC WITH DIFFERENTIAL/PLATELET
Abs Immature Granulocytes: 0.04 10*3/uL (ref 0.00–0.07)
Basophils Absolute: 0 10*3/uL (ref 0.0–0.1)
Basophils Relative: 0 %
Eosinophils Absolute: 0 10*3/uL (ref 0.0–0.5)
Eosinophils Relative: 0 %
HCT: 43.3 % (ref 36.0–46.0)
Hemoglobin: 12.2 g/dL (ref 12.0–15.0)
Immature Granulocytes: 1 %
Lymphocytes Relative: 10 %
Lymphs Abs: 0.8 10*3/uL (ref 0.7–4.0)
MCH: 27.4 pg (ref 26.0–34.0)
MCHC: 28.2 g/dL — ABNORMAL LOW (ref 30.0–36.0)
MCV: 97.3 fL (ref 80.0–100.0)
Monocytes Absolute: 0.9 10*3/uL (ref 0.1–1.0)
Monocytes Relative: 12 %
Neutro Abs: 6.1 10*3/uL (ref 1.7–7.7)
Neutrophils Relative %: 77 %
Platelets: 195 10*3/uL (ref 150–400)
RBC: 4.45 MIL/uL (ref 3.87–5.11)
RDW: 15.9 % — ABNORMAL HIGH (ref 11.5–15.5)
WBC: 7.8 10*3/uL (ref 4.0–10.5)
nRBC: 0 % (ref 0.0–0.2)

## 2019-09-22 LAB — POCT I-STAT 7, (LYTES, BLD GAS, ICA,H+H)
Acid-Base Excess: 9 mmol/L — ABNORMAL HIGH (ref 0.0–2.0)
Bicarbonate: 36 mmol/L — ABNORMAL HIGH (ref 20.0–28.0)
Calcium, Ion: 1.18 mmol/L (ref 1.15–1.40)
HCT: 41 % (ref 36.0–46.0)
Hemoglobin: 13.9 g/dL (ref 12.0–15.0)
O2 Saturation: 95 %
Patient temperature: 99.1
Potassium: 3.6 mmol/L (ref 3.5–5.1)
Sodium: 137 mmol/L (ref 135–145)
TCO2: 38 mmol/L — ABNORMAL HIGH (ref 22–32)
pCO2 arterial: 57.3 mmHg — ABNORMAL HIGH (ref 32.0–48.0)
pH, Arterial: 7.407 (ref 7.350–7.450)
pO2, Arterial: 76 mmHg — ABNORMAL LOW (ref 83.0–108.0)

## 2019-09-22 LAB — FERRITIN: Ferritin: 86 ng/mL (ref 11–307)

## 2019-09-22 LAB — GLUCOSE, CAPILLARY
Glucose-Capillary: 191 mg/dL — ABNORMAL HIGH (ref 70–99)
Glucose-Capillary: 202 mg/dL — ABNORMAL HIGH (ref 70–99)
Glucose-Capillary: 209 mg/dL — ABNORMAL HIGH (ref 70–99)
Glucose-Capillary: 216 mg/dL — ABNORMAL HIGH (ref 70–99)

## 2019-09-22 LAB — D-DIMER, QUANTITATIVE: D-Dimer, Quant: 11.79 ug/mL-FEU — ABNORMAL HIGH (ref 0.00–0.50)

## 2019-09-22 LAB — C-REACTIVE PROTEIN: CRP: 2.8 mg/dL — ABNORMAL HIGH (ref <1.0)

## 2019-09-22 LAB — PHOSPHORUS: Phosphorus: 2.5 mg/dL (ref 2.5–4.6)

## 2019-09-22 LAB — TRIGLYCERIDES: Triglycerides: 251 mg/dL — ABNORMAL HIGH (ref <150)

## 2019-09-22 LAB — MAGNESIUM: Magnesium: 1.8 mg/dL (ref 1.7–2.4)

## 2019-09-22 MED ORDER — VITAL AF 1.2 CAL PO LIQD: 1000.0000 mL | ORAL | Status: DC

## 2019-09-22 MED ORDER — IOHEXOL 350 MG/ML SOLN
75.0000 mL | Freq: Once | INTRAVENOUS | Status: AC | PRN
  Administered 2019-09-22: 13:00:00 75 mL via INTRAVENOUS

## 2019-09-22 MED ORDER — ENOXAPARIN SODIUM 80 MG/0.8ML ~~LOC~~ SOLN
70.0000 mg | Freq: Once | SUBCUTANEOUS | Status: AC
  Administered 2019-09-22: 70 mg via SUBCUTANEOUS
  Filled 2019-09-22: qty 0.8

## 2019-09-22 MED ORDER — INSULIN ASPART 100 UNIT/ML ~~LOC~~ SOLN
0.0000 [IU] | SUBCUTANEOUS | Status: DC
  Administered 2019-09-22 (×2): 3 [IU] via SUBCUTANEOUS

## 2019-09-22 MED ORDER — PRO-STAT SUGAR FREE PO LIQD
30.0000 mL | Freq: Two times a day (BID) | ORAL | Status: DC
  Administered 2019-09-22 – 2019-09-27 (×10): 30 mL
  Filled 2019-09-22 (×10): qty 30

## 2019-09-22 MED ORDER — ENOXAPARIN SODIUM 150 MG/ML ~~LOC~~ SOLN
1.0000 mg/kg | Freq: Two times a day (BID) | SUBCUTANEOUS | Status: DC
  Administered 2019-09-22 – 2019-09-23 (×2): 130 mg via SUBCUTANEOUS
  Filled 2019-09-22 (×2): qty 0.87

## 2019-09-22 MED ORDER — CHLORHEXIDINE GLUCONATE CLOTH 2 % EX PADS
6.0000 | MEDICATED_PAD | Freq: Every day | CUTANEOUS | Status: DC
  Administered 2019-09-22 – 2019-10-01 (×10): 6 via TOPICAL

## 2019-09-22 MED ORDER — FAMOTIDINE 40 MG/5ML PO SUSR
20.0000 mg | Freq: Two times a day (BID) | ORAL | Status: DC
  Administered 2019-09-22 – 2019-09-26 (×9): 20 mg
  Filled 2019-09-22 (×9): qty 2.5

## 2019-09-22 MED ORDER — VITAL HIGH PROTEIN PO LIQD
1000.0000 mL | ORAL | Status: DC
  Administered 2019-09-22 – 2019-09-25 (×4): 1000 mL

## 2019-09-22 MED ORDER — FENTANYL CITRATE (PF) 100 MCG/2ML IJ SOLN
100.0000 ug | INTRAMUSCULAR | Status: DC | PRN
  Administered 2019-09-22 – 2019-09-25 (×8): 100 ug via INTRAVENOUS
  Filled 2019-09-22 (×8): qty 2

## 2019-09-22 MED ORDER — PRO-STAT SUGAR FREE PO LIQD: 30.0000 mL | Freq: Every day | ORAL | Status: DC

## 2019-09-22 MED ORDER — INSULIN ASPART 100 UNIT/ML ~~LOC~~ SOLN
0.0000 [IU] | SUBCUTANEOUS | Status: DC
  Administered 2019-09-22: 20:00:00 3 [IU] via SUBCUTANEOUS
  Administered 2019-09-22: 12:00:00 5 [IU] via SUBCUTANEOUS
  Administered 2019-09-22: 09:00:00 3 [IU] via SUBCUTANEOUS
  Administered 2019-09-22: 2 [IU] via SUBCUTANEOUS
  Administered 2019-09-23: 3 [IU] via SUBCUTANEOUS
  Administered 2019-09-23 (×3): 5 [IU] via SUBCUTANEOUS

## 2019-09-22 NOTE — Progress Notes (Signed)
Bilateral lower ext venous duplex  has been completed. [facility] to [facility] under chart review to view preliminary results.   09/22/2019  1:33 PM Meline Russaw, Bonnye Fava

## 2019-09-22 NOTE — Progress Notes (Signed)
 Initial Nutrition Assessment   RD working remotely.   DOCUMENTATION CODES:   Morbid obesity  INTERVENTION:   Tube Feeding:  Vital High Protein at 45 ml/hr Pro-Stat 30 mL BID Provides 125 g of protein, 1280 kcals and 1109 mL of free water Meets 100% estimated protein needs  TF regimen and propofol at current rate providing 2660 total kcal/day exceeding recommended calories   NUTRITION DIAGNOSIS:   Inadequate oral intake related to acute illness as evidenced by NPO status.  GOAL:   Patient will meet greater than or equal to 90% of their needs  MONITOR:   TF tolerance, Skin, Vent status, Labs, Weight trends  REASON FOR ASSESSMENT:   Consult, Ventilator Enteral/tube feeding initiation and management  ASSESSMENT:   68 yo female with recent admission for COVID 19 pneumonia and re-admitted with acute respiratory failure requiring intubation. PMH includes stroke, HTN, DM, COPD, CHF  Pt currently sedated on vent support Propofol: 50 ml/hr  OG tube with tip in stomach per abd xray  Unable to obtain diet and weight history at this time  Labs: CBGs 191-209 (goal 140-180) Meds: ss novolog, solumedrol     Diet Order:   Diet Order            Diet NPO time specified  Diet effective now              EDUCATION NEEDS:   Not appropriate for education at this time  Skin:  Skin Assessment: Reviewed RN Assessment  Last BM:  11/6  Height:   Ht Readings from Last 1 Encounters:  09/21/19 5\' 5"  (1.651 m)    Weight:   Wt Readings from Last 1 Encounters:  09/22/19 129.9 kg    Ideal Body Weight:  56.8 kg  BMI:  Body mass index is 47.66 kg/m.  Estimated Nutritional Needs:   Kcal:  1660-6301 kcals  Protein:  114-142 g  Fluid:  >/= 1,7 L    Assyria Morreale MS, RDN, LDN, CNSC [phone] Pager  [phone] Weekend/On-Call Pager

## 2019-09-22 NOTE — Progress Notes (Signed)
ANTICOAGULATION CONSULT NOTE - Initial Consult  Pharmacy Consult for Lovenox Indication: R/o VTE  Allergies  Allergen Reactions  . Amoxicillin   . Amoxicillin-Pot Clavulanate Diarrhea    Has patient had a PCN reaction causing immediate rash, facial/tongue/throat swelling, SOB or lightheadedness with hypotension: No Has patient had a PCN reaction causing severe rash involving mucus membranes or skin necrosis: No Has patient had a PCN reaction that required hospitalization: No Has patient had a PCN reaction occurring within the last 10 years: No If all of the above answers are "NO", then may proceed with Cephalosporin use.   . Bacitracin   . Neosporin [Neomycin-Bacitracin Zn-Polymyx] Hives    Patient Measurements: Weight: 286 lb 6 oz (129.9 kg)  Vital Signs: Temp: 99.2 F (37.3 C) (11/06 0400) Temp Source: Axillary (11/06 0400) BP: 136/76 (11/06 0600) Pulse Rate: 80 (11/06 0600)  Labs: Recent Labs    09/21/19 0601 09/21/19 1650 09/22/19 0550 09/22/19 0608  HGB 12.5 13.9 12.2 13.9  HCT 45.1 41.0 43.3 41.0  PLT 182  --  195  --   CREATININE 0.72  --  0.81  --   TROPONINIHS 16  --   --   --     Estimated Creatinine Clearance: 89.2 mL/min (by C-G formula based on SCr of 0.81 mg/dL).   Medical History: Past Medical History:  Diagnosis Date  . Arthritis   . Asthma   . Brain aneurysm   . CHF (congestive heart failure) ([place])   . COPD (chronic obstructive pulmonary disease) ([place])   . Diabetes mellitus without complication ([place])   . Hypertension   . Stroke [facility] 2003    Medications:  Medications Prior to Admission  Medication Sig Dispense Refill Last Dose  . acetaminophen (TYLENOL) 500 MG tablet Take 1,000 mg by mouth 3 (three) times daily.   09/20/2019 at Unknown time  . albuterol (VENTOLIN HFA) 108 (90 Base) MCG/ACT inhaler Inhale 2 puffs into the lungs every 6 (six) hours as needed for wheezing or shortness of breath.   unknown  . atorvastatin (LIPITOR) 20 MG  tablet Take 1 tablet (20 mg total) by mouth daily. (Patient taking differently: Take 20 mg by mouth daily. At 1800) 90 tablet 0 09/20/2019 at Unknown time  . Carboxymethylcellulose Sod PF (REFRESH CELLUVISC) 1 % GEL Apply 1 drop to eye 4 (four) times daily.   09/20/2019 at Unknown time  . clopidogrel (PLAVIX) 75 MG tablet Take 1 tablet (75 mg total) by mouth daily. 90 tablet 1 09/20/2019 at Unknown time  . clotrimazole (LOTRIMIN) 1 % cream Apply to affected area 2 times daily 15 g 0 09/20/2019 at Unknown time  . cyclobenzaprine (FLEXERIL) 5 MG tablet Take 5 mg by mouth 3 (three) times daily as needed.    Past Week at Unknown time  . dextromethorphan-guaiFENesin (ROBITUSSIN-DM) 10-100 MG/5ML liquid Take 10 mLs by mouth every 4 (four) hours as needed for cough.   09/20/2019 at Unknown time  . diclofenac sodium (VOLTAREN) 1 % GEL Apply 2 g topically 2 (two) times daily. Apply to knees, feet BID   09/20/2019 at Unknown time  . docusate sodium (COLACE) 100 MG capsule Take by mouth 2 (two) times daily.    09/20/2019 at Unknown time  . fluticasone (FLONASE) 50 MCG/ACT nasal spray Place 1 spray into both nostrils daily.    09/20/2019 at Unknown time  . Fluticasone-Salmeterol (ADVAIR) 500-50 MCG/DOSE AEPB Inhale 1 puff into the lungs 2 (two) times daily.   09/20/2019 at Unknown time  .  gabapentin (NEURONTIN) 300 MG capsule Take 900 mg by mouth daily at 8 pm.    09/20/2019 at Unknown time  . gabapentin (NEURONTIN) 300 MG capsule Take 300 mg by mouth 2 (two) times daily. At 08:00 and 14:00   09/20/2019 at Unknown time  . guaiFENesin (ROBITUSSIN) 100 MG/5ML SOLN Take 10 mLs by mouth every 4 (four) hours as needed for cough or to loosen phlegm.   unknown  . lisinopril (PRINIVIL,ZESTRIL) 2.5 MG tablet Take 1 tablet (2.5 mg total) by mouth daily. 30 tablet 3 09/20/2019 at Unknown time  . loratadine (CLARITIN) 10 MG tablet Take 10 mg by mouth daily.   09/20/2019 at Unknown time  . Melatonin 5 MG TABS Take 5 mg by mouth at  bedtime.   09/20/2019 at Unknown time  . metFORMIN (GLUCOPHAGE) 500 MG tablet Take 500 mg by mouth 2 (two) times daily with a meal.    09/20/2019 at Unknown time  . methylPREDNISolone (MEDROL DOSEPAK) 4 MG TBPK tablet follow package directions 21 tablet 0 09/20/2019 at Unknown time  . mirabegron ER (MYRBETRIQ) 25 MG TB24 tablet Take 25 mg by mouth daily.   09/20/2019 at Unknown time  . pantoprazole (PROTONIX) 20 MG tablet Take 20 mg by mouth daily as needed.    unknown  . potassium chloride (K-DUR) 10 MEQ tablet TAKE 2 TABLETS BY MOUTH EVERY DAY (Patient taking differently: Take 20 mEq by mouth daily. ) 60 tablet 1 09/20/2019 at Unknown time  . RESTASIS 0.05 % ophthalmic emulsion Place 1 drop into both eyes 2 (two) times daily.    09/20/2019 at Unknown time  . solifenacin (VESICARE) 5 MG tablet Take 1 tablet (5 mg total) by mouth daily. 30 tablet 3 09/20/2019 at Unknown time  . tiotropium (SPIRIVA HANDIHALER) 18 MCG inhalation capsule Use once day 30 capsule 12 09/20/2019 at Unknown time  . traMADol (ULTRAM) 50 MG tablet Take 0.5 tablets (25 mg total) by mouth every 12 (twelve) hours as needed for moderate pain. 5 tablet 0 unknown  . traZODone (DESYREL) 50 MG tablet Take 25 mg by mouth daily at 8 pm.    09/20/2019 at Unknown time  . furosemide (LASIX) 40 MG tablet Take 1 tablet (40 mg total) by mouth 2 (two) times daily. (Patient not taking: Reported on 09/21/2019) 60 tablet 3 Not Taking at Unknown time  . Homeopathic Products (THERAWORX RELIEF) FOAM Apply 1 application topically 2 (two) times daily as needed.       Assessment: 19 YOF recently discharged from [facility] readmitted on 11/5 with hypercarbic respiratory failure. D-dimer this AM is elevated at 11.79 (<5 on previous admission) raising concern for VTE. Pharmacy consulted to increase Lovenox from prophylactic dose to treatment dose. Of note, she received Lovenox 60 mg this AM   H/H and Plt wnl. SCr wnl.   Goal of Therapy:  Anti-Xa level  0.6-1 units/ml 4hrs after LMWH dose given Monitor platelets by anticoagulation protocol: Yes   Plan:  -Increase Lovenox to 1 mg/kg (130 mg) Q 12 hours. Will give additional 70 mg of Lovenox now -Monitor CBC, renal fx and s/s of bleeding -F/u LE dopplers   Vinnie Level, PharmD., BCPS Clinical Pharmacist Clinical phone for 09/22/19 until 5pm: [phone]

## 2019-09-22 NOTE — Progress Notes (Signed)
Lavaged pt with 10 cc saline per MD order. Moderate amount of tan/pink tinged thick/frothy secretions suctioned out.

## 2019-09-22 NOTE — Progress Notes (Addendum)
Karen Perez  WER:[id number] DOB: 10-Nov-1951 DOA: 09/21/2019 PCP: Lavera Guise, MD    Brief Narrative:  68 year old [facility] resident with a history of 1 L nasal cannula O2 dependent COPD, DM2, HTN, prior CVA, HLD, OSA on CPAP, multiple cerebral aneurysms requiring clipping, and chronic diastolic CHF who was discharged from [facility] 10/29 on her baseline oxygen support and returned to [facility] ED with progressive worsening shortness of breath.  In the emergency room she was found to have oxygen saturation of 70% on 2 L of nasal cannula oxygen.  Her blood pressure was stable at presentation.  A chest x-ray in the ED noted bilateral infiltrates worse on the left.  Arrangements were made for the patient to be transferred from [facility] back to the [place] progressive care unit.  While awaiting transfer to [facility] her oxygen requirements increased from nasal cannula to HFNC, and she was ultimately no longer able to be managed with high flow nasal cannula alone.  She was intubated while still in the ED.  Significant Events: 10/24 - 10/29 admit to [facility] - d/c home  11/5 readmit to [facility] via [facility] ED - intubated in [facility] ED  COVID-19 specific Treatment: Solu-Medrol 10/24 >11/2 Remdesivir 10/24 > 10/28  Subjective: Appears comfortable sedated on vent.  No evidence of uncontrolled pain or respiratory distress.  Assessment & Plan:  Acute on chronic hypoxic and hypercarbic respiratory failure Exact etiology unclear -questionable pneumonia versus PE versus COPD exacerbation -vent management per PCCM - weaning attempts to be made  Markedly elevated d-Dimer Worrisome for possibility of PE in a patient with multiple risk factors -checking venous Dopplers -CT angio of the chest today  Recent Labs  Lab 09/22/19 0550  DDIMER 11.79*    Chronic diastolic CHF Physical exam not consistent with acute CHF exacerbation -follow I's and O's and weights -keep patient on the dry  side as blood pressure tolerates  COPD Management per Pulmonary -continue steroids and nebulizers  OSA On ventilator at present  HTN Blood pressure currently controlled  DM2 CBG trending upward with use of steroids -adjust treatment and follow  GERD Cont H2 blocker   HLD Continue statin  Hx of CVA On plavix and statin long term   Obesity - Estimated body mass index is 47.69 kg/m as calculated from the following:   Height as of an earlier encounter on 09/21/19: 5' 5"  (1.651 m).   Weight as of an earlier encounter on 09/21/19: 130 kg.  Incidental R thyroid mass (2.3 x 1.5cm) Noted during prior hospital stay -will need follow-up once discharged again for thyroid US -will check TSH and free T4  DVT prophylaxis: Empiric full dose Lovenox Code Status: FULL CODE Family Communication:  Disposition Plan: ICU  Consultants:  PCCM  Antimicrobials:  Maxipime 11/5 >  Objective: Blood pressure 127/69, pulse 79, temperature 99.8 F (37.7 C), temperature source Oral, resp. rate (!) 26, weight 129.9 kg, SpO2 93 %.  Intake/Output Summary (Last 24 hours) at 09/22/2019 1055 Last data filed at 09/22/2019 1033 Gross per 24 hour  Intake 1878.85 ml  Output 517 ml  Net 1361.85 ml   Filed Weights   09/22/19 0436  Weight: 129.9 kg    Examination: General: No acute respiratory distress sedated on vent Lungs: Fine basilar crackles with no wheezing Cardiovascular: Regular rate and rhythm -distant heart sounds Abdomen: Soft, morbidly obese, no appreciable mass nondistended Extremities: No trace bilateral lower extremity edema  CBC: Recent Labs  Lab 09/21/19 0601 09/21/19 1650 09/22/19 0550 09/22/19 0608  WBC 7.4  --  7.8  --   NEUTROABS 5.9  --  6.1  --   HGB 12.5 13.9 12.2 13.9  HCT 45.1 41.0 43.3 41.0  MCV 99.8  --  97.3  --   PLT 182  --  195  --    Basic Metabolic Panel: Recent Labs  Lab 09/21/19 0601 09/21/19 1650 09/21/19 1755 09/22/19 0550 09/22/19  0608  NA 142 139  --  139 137  K 4.7 4.0  --  3.7 3.6  CL 96*  --   --  93*  --   CO2 35*  --   --  32  --   GLUCOSE 116*  --   --  190*  --   BUN 16  --   --  18  --   CREATININE 0.72  --   --  0.81  --   CALCIUM 8.9  --   --  8.6*  --   MG  --   --  1.8 1.8  --   PHOS  --   --  2.2* 2.5  --    GFR: Estimated Creatinine Clearance: 89.2 mL/min (by C-G formula based on SCr of 0.81 mg/dL).  Liver Function Tests: Recent Labs  Lab 09/22/19 0550  AST 20  ALT 13  ALKPHOS 44  BILITOT 0.4  PROT 6.6  ALBUMIN 2.7*    HbA1C: Hemoglobin A1C  Date/Time Value Ref Range Status  02/23/2018 02:50 PM 5.6  Final   Hgb A1c MFr Bld  Date/Time Value Ref Range Status  09/10/2019 02:09 AM 6.6 (H) 4.8 - 5.6 % Final    Comment:    (NOTE) Pre diabetes:          5.7%-6.4% Diabetes:              >6.4% Glycemic control for   <7.0% adults with diabetes   10/23/2010 11:37 AM 5.9 4.6 - 6.5 % Final    Comment:    See lab report for associated comment(s)    CBG: Recent Labs  Lab 09/21/19 2353 09/22/19 0425 09/22/19 0855  GLUCAP 209* 202* 191*    Recent Results (from the past 240 hour(s))  MRSA PCR Screening     Status: None   Collection Time: 09/21/19  6:00 PM   Specimen: Nasopharyngeal  Result Value Ref Range Status   MRSA by PCR NEGATIVE NEGATIVE Final    Comment:        The GeneXpert MRSA Assay (FDA approved for NASAL specimens only), is one component of a comprehensive MRSA colonization surveillance program. It is not intended to diagnose MRSA infection nor to guide or monitor treatment for MRSA infections. Performed at [facility], [place] [address] [place], [place] [address]      Scheduled Meds: . arformoterol  15 mcg Nebulization BID  . atorvastatin  20 mg Per Tube q1800  . budesonide (PULMICORT) nebulizer solution  0.5 mg Nebulization BID  . chlorhexidine gluconate (MEDLINE KIT)  15 mL Mouth Rinse BID  . Chlorhexidine Gluconate Cloth  6 each  Topical Daily  . clopidogrel  75 mg Per Tube Daily  . cycloSPORINE  1 drop Both Eyes BID  . enoxaparin (LOVENOX) injection  1 mg/kg Subcutaneous Q12H  . famotidine  20 mg Per Tube BID  . feeding supplement (PRO-STAT SUGAR FREE 64)  30 mL Per Tube BID  . feeding supplement (VITAL HIGH PROTEIN)  1,000 mL Per Tube Q24H  . insulin aspart  0-15 Units Subcutaneous Q4H  . ipratropium-albuterol  3 mL Nebulization Q4H  . mouth rinse  15 mL Mouth Rinse 10 times per day  . methylPREDNISolone (SOLU-MEDROL) injection  40 mg Intravenous Q12H  . senna  1 tablet Per Tube BID   Continuous Infusions: . ceFEPime (MAXIPIME) IV 2 g (09/22/19 0924)  . propofol (DIPRIVAN) infusion 56 mcg/kg/min (09/22/19 1033)     LOS: 1 day   Cherene Altes, MD [facility] Office  [phone] Pager - Text Page per Amion  If 7PM-7AM, please contact night-coverage per Amion 09/22/2019, 10:55 AM

## 2019-09-22 NOTE — Progress Notes (Signed)
NAME:  Karen Perez, MRN:  [id number], DOB:  06-16-1951, LOS: 1 ADMISSION DATE:  09/21/2019, CONSULTATION DATE:  11/5 REFERRING MD:  Gerre Pebbles, CHIEF COMPLAINT:  Dyspnea   Brief History   68 y/o female recently discharged from this hospital after treatment for COVID 19 pneumonia was re-admitted on 11/5 with hypercarbic respiratory failure requiring intubation and mechanical ventilation.  Past Medical History  Stroke Hypertension Diabetes mellitus COPD CHF Asthma Arthritis  Significant Hospital Events   October 29 discharged from [facility] after being treated for Covid pneumonia with Decadron and remdesivir November 5 admission >   Consults:  Pulmonary and critical care medicine  Procedures:  11/5 ETT >   Significant Diagnostic Tests:  11/6 CT angiogram chest > no PE, patchy bilateral airspace disease, worse compared to prior  Micro Data:  10/24 SARS COV 2 > positive 11/5 resp culture >   Antimicrobials:  10/24 remdesivir > 10/28 11/5 ceftriaxone >   11/5 azithro >   Interim history/subjective:   Weaned FiO2 overnight Remains mechanically ventilated  Objective   Blood pressure 136/76, pulse 80, temperature 99.2 F (37.3 C), temperature source Axillary, resp. rate (!) 26, weight 129.9 kg, SpO2 95 %.    Vent Mode: PRVC FiO2 (%):  [80 %-100 %] 80 % Set Rate:  [22 bmp-26 bmp] 26 bmp Vt Set:  [450 mL-500 mL] 450 mL PEEP:  [10 cmH20-12 cmH20] 10 cmH20 Plateau Pressure:  [23 cmH20-28 cmH20] 24 cmH20   Intake/Output Summary (Last 24 hours) at 09/22/2019 8786 Last data filed at 09/22/2019 0600 Gross per 24 hour  Intake 1252.29 ml  Output 517 ml  Net 735.29 ml   Filed Weights   09/22/19 0436  Weight: 129.9 kg    Examination:  General:  In bed on vent HENT: NCAT ETT in place PULM: CTA B, vent supported breathing CV: RRR, no mgr GI: BS+, soft, nontender MSK: normal bulk and tone Neuro: sedated on vent   11/5 CXR images reviewed > RUL  infiltrate, ett in place  Resolved Hospital Problem list     Assessment & Plan:  Acute hypercarbic and hypoxemic respiratory failure: progression of infiltrates from COVID on imaging, post COVID inflammatory changes? clinically seems like COPD exacerbation Continue full mechanical ventilatory support Wean PEEP and FiO2 again today, goal SaO2 > 85% VAP prevention Continuing solumedrol, monitor infiltrates and consider stopping after 5-7 days  COPD, acute exacerbation Solumedrol duoneb scheduled Brovana/pulmicort  OSA Consider Heated high flow qHS after extubation  Pneumonia, at risk for pseudomonas given recent hospitalization, steroids, SNF: Cefepime per pharmacy F/U resp culture  Need for sedation for mechanical ventilation Propofol, fentanyl per PAD protocol RASS goal -1   Best practice:  Diet: start tube feeding Pain/Anxiety/Delirium protocol (if indicated): as above VAP protocol (if indicated): yes DVT prophylaxis: lovenox GI prophylaxis: famotidine Glucose control: SSI Mobility: bed rest Code Status: full Family Communication: I update  Disposition: remain in ICU  Labs   CBC: Recent Labs  Lab 09/21/19 0601 09/21/19 1650 09/22/19 0550 09/22/19 0608  WBC 7.4  --  7.8  --   NEUTROABS 5.9  --  6.1  --   HGB 12.5 13.9 12.2 13.9  HCT 45.1 41.0 43.3 41.0  MCV 99.8  --  97.3  --   PLT 182  --  195  --     Basic Metabolic Panel: Recent Labs  Lab 09/21/19 0601 09/21/19 1650 09/21/19 1755 09/22/19 0608  NA 142 139  --  137  K 4.7 4.0  --  3.6  CL 96*  --   --   --   CO2 35*  --   --   --   GLUCOSE 116*  --   --   --   BUN 16  --   --   --   CREATININE 0.72  --   --   --   CALCIUM 8.9  --   --   --   MG  --   --  1.8  --   PHOS  --   --  2.2*  --    GFR: Estimated Creatinine Clearance: 90.3 mL/min (by C-G formula based on SCr of 0.72 mg/dL). Recent Labs  Lab 09/21/19 0601 09/21/19 0819 09/22/19 0550  PROCALCITON  --  <0.10  --   WBC 7.4  --   7.8    Liver Function Tests: No results for input(s): AST, ALT, ALKPHOS, BILITOT, PROT, ALBUMIN in the last 168 hours. No results for input(s): LIPASE, AMYLASE in the last 168 hours. No results for input(s): AMMONIA in the last 168 hours.  ABG    Component Value Date/Time   PHART 7.407 09/22/2019 0608   PCO2ART 57.3 (H) 09/22/2019 0608   PO2ART 76.0 (L) 09/22/2019 0608   HCO3 36.0 (H) 09/22/2019 0608   TCO2 38 (H) 09/22/2019 0608   O2SAT 95.0 09/22/2019 0608     Coagulation Profile: No results for input(s): INR, PROTIME in the last 168 hours.  Cardiac Enzymes: No results for input(s): CKTOTAL, CKMB, CKMBINDEX, TROPONINI in the last 168 hours.  HbA1C: Hemoglobin A1C  Date/Time Value Ref Range Status  02/23/2018 02:50 PM 5.6  Final   Hgb A1c MFr Bld  Date/Time Value Ref Range Status  09/10/2019 02:09 AM 6.6 (H) 4.8 - 5.6 % Final    Comment:    (NOTE) Pre diabetes:          5.7%-6.4% Diabetes:              >6.4% Glycemic control for   <7.0% adults with diabetes   10/23/2010 11:37 AM 5.9 4.6 - 6.5 % Final    Comment:    See lab report for associated comment(s)    CBG: Recent Labs  Lab 09/21/19 2353 09/22/19 0425  GLUCAP 209* 202*      Critical care time: 35 minutes     Roselie Awkward, MD [place] [facility] Pager: [phone] Cell: [phone] If no response, call [phone]

## 2019-09-23 ENCOUNTER — Encounter (HOSPITAL_COMMUNITY): Payer: Self-pay

## 2019-09-23 ENCOUNTER — Other Ambulatory Visit: Payer: Self-pay

## 2019-09-23 LAB — CBC WITH DIFFERENTIAL/PLATELET
Abs Immature Granulocytes: 0.04 10*3/uL (ref 0.00–0.07)
Basophils Absolute: 0 10*3/uL (ref 0.0–0.1)
Basophils Relative: 0 %
Eosinophils Absolute: 0 10*3/uL (ref 0.0–0.5)
Eosinophils Relative: 0 %
HCT: 42.7 % (ref 36.0–46.0)
Hemoglobin: 12.5 g/dL (ref 12.0–15.0)
Immature Granulocytes: 0 %
Lymphocytes Relative: 9 %
Lymphs Abs: 0.8 10*3/uL (ref 0.7–4.0)
MCH: 28.1 pg (ref 26.0–34.0)
MCHC: 29.3 g/dL — ABNORMAL LOW (ref 30.0–36.0)
MCV: 96 fL (ref 80.0–100.0)
Monocytes Absolute: 0.8 10*3/uL (ref 0.1–1.0)
Monocytes Relative: 9 %
Neutro Abs: 7.9 10*3/uL — ABNORMAL HIGH (ref 1.7–7.7)
Neutrophils Relative %: 82 %
Platelets: 185 10*3/uL (ref 150–400)
RBC: 4.45 MIL/uL (ref 3.87–5.11)
RDW: 15.9 % — ABNORMAL HIGH (ref 11.5–15.5)
WBC: 9.6 10*3/uL (ref 4.0–10.5)
nRBC: 0 % (ref 0.0–0.2)

## 2019-09-23 LAB — D-DIMER, QUANTITATIVE: D-Dimer, Quant: 3.54 ug/mL-FEU — ABNORMAL HIGH (ref 0.00–0.50)

## 2019-09-23 LAB — TRIGLYCERIDES: Triglycerides: 259 mg/dL — ABNORMAL HIGH (ref <150)

## 2019-09-23 LAB — TSH: TSH: 0.289 u[IU]/mL — ABNORMAL LOW (ref 0.350–4.500)

## 2019-09-23 LAB — GLUCOSE, CAPILLARY
Glucose-Capillary: 178 mg/dL — ABNORMAL HIGH (ref 70–99)
Glucose-Capillary: 222 mg/dL — ABNORMAL HIGH (ref 70–99)
Glucose-Capillary: 193 mg/dL — ABNORMAL HIGH (ref 70–99)
Glucose-Capillary: 250 mg/dL — ABNORMAL HIGH (ref 70–99)
Glucose-Capillary: 209 mg/dL — ABNORMAL HIGH (ref 70–99)
Glucose-Capillary: 126 mg/dL — ABNORMAL HIGH (ref 70–99)

## 2019-09-23 LAB — POCT I-STAT 7, (LYTES, BLD GAS, ICA,H+H)
Acid-Base Excess: 8 mmol/L — ABNORMAL HIGH (ref 0.0–2.0)
Bicarbonate: 34.3 mmol/L — ABNORMAL HIGH (ref 20.0–28.0)
Calcium, Ion: 1.18 mmol/L (ref 1.15–1.40)
HCT: 41 % (ref 36.0–46.0)
Hemoglobin: 13.9 g/dL (ref 12.0–15.0)
O2 Saturation: 91 %
Patient temperature: 98.6
Potassium: 3.9 mmol/L (ref 3.5–5.1)
Sodium: 137 mmol/L (ref 135–145)
TCO2: 36 mmol/L — ABNORMAL HIGH (ref 22–32)
pCO2 arterial: 54.7 mmHg — ABNORMAL HIGH (ref 32.0–48.0)
pH, Arterial: 7.406 (ref 7.350–7.450)
pO2, Arterial: 61 mmHg — ABNORMAL LOW (ref 83.0–108.0)

## 2019-09-23 LAB — COMPREHENSIVE METABOLIC PANEL
ALT: 12 U/L (ref 0–44)
AST: 16 U/L (ref 15–41)
Albumin: 2.5 g/dL — ABNORMAL LOW (ref 3.5–5.0)
Alkaline Phosphatase: 45 U/L (ref 38–126)
Anion gap: 14 (ref 5–15)
BUN: 23 mg/dL (ref 8–23)
CO2: 28 mmol/L (ref 22–32)
Calcium: 8.4 mg/dL — ABNORMAL LOW (ref 8.9–10.3)
Chloride: 95 mmol/L — ABNORMAL LOW (ref 98–111)
Creatinine, Ser: 0.72 mg/dL (ref 0.44–1.00)
GFR calc Af Amer: >60 mL/min (ref >60)
GFR calc non Af Amer: >60 mL/min (ref >60)
Glucose, Bld: 201 mg/dL — ABNORMAL HIGH (ref 70–99)
Potassium: 4 mmol/L (ref 3.5–5.1)
Sodium: 137 mmol/L (ref 135–145)
Total Bilirubin: 0.2 mg/dL — ABNORMAL LOW (ref 0.3–1.2)
Total Protein: 6.4 g/dL — ABNORMAL LOW (ref 6.5–8.1)

## 2019-09-23 LAB — PHOSPHORUS: Phosphorus: 2.5 mg/dL (ref 2.5–4.6)

## 2019-09-23 LAB — C-REACTIVE PROTEIN: CRP: 3.9 mg/dL — ABNORMAL HIGH (ref <1.0)

## 2019-09-23 LAB — MAGNESIUM: Magnesium: 2 mg/dL (ref 1.7–2.4)

## 2019-09-23 LAB — FERRITIN: Ferritin: 73 ng/mL (ref 11–307)

## 2019-09-23 MED ORDER — METHYLPREDNISOLONE SODIUM SUCC 40 MG IJ SOLR
40.0000 mg | Freq: Every day | INTRAMUSCULAR | Status: AC
  Administered 2019-09-24 – 2019-09-25 (×2): 40 mg via INTRAVENOUS
  Filled 2019-09-23 (×2): qty 1

## 2019-09-23 MED ORDER — FUROSEMIDE 10 MG/ML IJ SOLN
40.0000 mg | Freq: Once | INTRAMUSCULAR | Status: AC
  Administered 2019-09-23: 40 mg via INTRAVENOUS
  Filled 2019-09-23: qty 4

## 2019-09-23 MED ORDER — ENOXAPARIN SODIUM 80 MG/0.8ML ~~LOC~~ SOLN
65.0000 mg | Freq: Two times a day (BID) | SUBCUTANEOUS | Status: DC
  Administered 2019-09-23 – 2019-09-28 (×10): 65 mg via SUBCUTANEOUS
  Filled 2019-09-23 (×10): qty 0.8

## 2019-09-23 MED ORDER — QUETIAPINE FUMARATE 25 MG PO TABS
25.0000 mg | ORAL_TABLET | Freq: Two times a day (BID) | ORAL | Status: DC
  Administered 2019-09-23 – 2019-09-27 (×9): 25 mg via ORAL
  Filled 2019-09-23 (×9): qty 1

## 2019-09-23 MED ORDER — INSULIN ASPART 100 UNIT/ML ~~LOC~~ SOLN
0.0000 [IU] | SUBCUTANEOUS | Status: DC
  Administered 2019-09-23: 7 [IU] via SUBCUTANEOUS
  Administered 2019-09-23: 3 [IU] via SUBCUTANEOUS
  Administered 2019-09-24: 4 [IU] via SUBCUTANEOUS
  Administered 2019-09-24: 3 [IU] via SUBCUTANEOUS
  Administered 2019-09-24 (×4): 4 [IU] via SUBCUTANEOUS
  Administered 2019-09-25 (×2): 7 [IU] via SUBCUTANEOUS
  Administered 2019-09-25 (×2): 3 [IU] via SUBCUTANEOUS
  Administered 2019-09-25: 4 [IU] via SUBCUTANEOUS
  Administered 2019-09-25 – 2019-09-26 (×2): 3 [IU] via SUBCUTANEOUS
  Administered 2019-09-26 (×2): 4 [IU] via SUBCUTANEOUS
  Administered 2019-09-26: 3 [IU] via SUBCUTANEOUS
  Administered 2019-09-27 (×2): 7 [IU] via SUBCUTANEOUS
  Administered 2019-09-28 (×2): 3 [IU] via SUBCUTANEOUS
  Administered 2019-09-28 (×2): 4 [IU] via SUBCUTANEOUS
  Administered 2019-09-28: 04:00:00 3 [IU] via SUBCUTANEOUS
  Administered 2019-09-29: 4 [IU] via SUBCUTANEOUS
  Administered 2019-09-29 (×2): 3 [IU] via SUBCUTANEOUS
  Administered 2019-09-29 (×3): 4 [IU] via SUBCUTANEOUS
  Administered 2019-09-30 (×2): 3 [IU] via SUBCUTANEOUS

## 2019-09-23 NOTE — Progress Notes (Addendum)
ANTICOAGULATION CONSULT NOTE - Follow Up Consult  Pharmacy Consult for Lovenox Indication: VTE prophylaxis  Allergies  Allergen Reactions  . Amoxicillin   . Amoxicillin-Pot Clavulanate Diarrhea    Has patient had a PCN reaction causing immediate rash, facial/tongue/throat swelling, SOB or lightheadedness with hypotension: No Has patient had a PCN reaction causing severe rash involving mucus membranes or skin necrosis: No Has patient had a PCN reaction that required hospitalization: No Has patient had a PCN reaction occurring within the last 10 years: No If all of the above answers are "NO", then may proceed with Cephalosporin use.   . Bacitracin   . Neosporin [Neomycin-Bacitracin Zn-Polymyx] Hives    Patient Measurements: Height: 5' 5"  (165.1 cm) Weight: 286 lb 6 oz (129.9 kg) IBW/kg (Calculated) : 57 Heparin Dosing Weight:  Vital Signs: Temp: 98.3 F (36.8 C) (11/07 0400) Temp Source: Oral (11/07 0359) BP: 143/73 (11/07 0600) Pulse Rate: 84 (11/07 0600)  Labs: Recent Labs    09/21/19 0601  09/22/19 0550 09/22/19 0608 09/23/19 0358 09/23/19 0519  HGB 12.5   < > 12.2 13.9 13.9 12.5  HCT 45.1   < > 43.3 41.0 41.0 42.7  PLT 182  --  195  --   --  185  CREATININE 0.72  --  0.81  --   --   --   TROPONINIHS 16  --   --   --   --   --    < > = values in this interval not displayed.    Estimated Creatinine Clearance: 89.2 mL/min (by C-G formula based on SCr of 0.81 mg/dL).   Medications:  Scheduled:  . arformoterol  15 mcg Nebulization BID  . atorvastatin  20 mg Per Tube q1800  . budesonide (PULMICORT) nebulizer solution  0.5 mg Nebulization BID  . chlorhexidine gluconate (MEDLINE KIT)  15 mL Mouth Rinse BID  . Chlorhexidine Gluconate Cloth  6 each Topical Daily  . clopidogrel  75 mg Per Tube Daily  . cycloSPORINE  1 drop Both Eyes BID  . enoxaparin (LOVENOX) injection  1 mg/kg Subcutaneous Q12H  . famotidine  20 mg Per Tube BID  . feeding supplement (PRO-STAT  SUGAR FREE 64)  30 mL Per Tube BID  . insulin aspart  0-15 Units Subcutaneous Q4H  . ipratropium-albuterol  3 mL Nebulization Q4H  . mouth rinse  15 mL Mouth Rinse 10 times per day  . methylPREDNISolone (SOLU-MEDROL) injection  40 mg Intravenous Q12H  . senna  1 tablet Per Tube BID   Infusions:  . ceFEPime (MAXIPIME) IV Stopped (09/23/19 0329)  . feeding supplement (VITAL HIGH PROTEIN) 45 mL/hr at 09/23/19 0400  . propofol (DIPRIVAN) infusion 40 mcg/kg/min (09/23/19 2876)    Assessment: 30 YOF recently discharged from [facility] readmitted on 11/5 with hypercarbic respiratory failure. D-dimer this AM is elevated at 11.79 (<5 on previous admission) raising concern for VTE and Lovenox was increased to treatment dose.  With negative dopplers and CTa, Pharmacy is consulted to reduce to ICU VTE prophylaxis dosing. H/H and Plt wnl. SCr wnl. LE dopplers negative for DVT, but limited exam.CTa negative for PE.  Goal of Therapy:  Monitor platelets by anticoagulation protocol: Yes   Plan:  Lovenox 0.5 mg/kg (65 mg) [place] q12h. Monitor CBC, renal fx and s/s of bleeding Pharmacy to sign off notes, but will continue to monitor peripherally.  Gretta Arab PharmD, BCPS Clinical pharmacist phone 7am- 5pm: [phone] 09/23/2019 6:48 AM

## 2019-09-23 NOTE — Progress Notes (Signed)
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  In the emergency room she was found to have oxygen saturation of 70% on 2 L of nasal cannula oxygen.  Her blood pressure was stable at presentation.  A chest x-ray in the ED noted bilateral infiltrates worse on the left.  Arrangements were made for the patient to be transferred from [facility] back to the [facility] progressive care unit.  While awaiting transfer to [facility] her oxygen requirements increased from nasal cannula to HFNC, and she was ultimately no longer able to be managed with high flow nasal cannula alone.  She was intubated while still in the ED.  Significant Events: 10/24 - 10/29 admit to [facility] - d/c home  11/5 readmit to [facility] via [facility] ED - intubated in [facility] ED  COVID-19 specific Treatment: Solu-Medrol 10/24 >11/2 Remdesivir 10/24 > 10/28  Subjective: Appears to be resting comfortably on the ventilator.  Assessment & Plan:  Acute on chronic hypoxic and hypercarbic respiratory failure Exact etiology unclear -questionable pneumonia versus COPD exacerbation -vent management per PCCM - weaning attempts to be made  Markedly elevated d-Dimer CT angio of the chest w/o evidence of PE - venous duplex limited but w/o evidence of DVT - changed to prophylactic dose lovenox   Recent Labs  Lab 09/22/19 0550 09/23/19 0519  DDIMER 11.79* 3.54*    Chronic diastolic CHF Physical exam not consistent with acute CHF exacerbation -follow I's and O's and weights -keep patient on the dry side as blood pressure tolerates   COPD Management per Pulmonary - continue steroids and nebulizers  OSA On ventilator at present  HTN Blood pressure trending upward -watch with diuresis today  DM2 -uncontrolled CBG trending upward with use of steroids -adjust treatment again today and follow  GERD Cont H2 blocker   HLD Continue statin  Hx of CVA On plavix and statin long term   Obesity - Estimated body mass index is 47.69 kg/m as calculated from the following:   Height as of an earlier encounter on 09/21/19: _0  (1.651 m).   Weight as of an earlier encounter on 09/21/19: 130 kg.  Incidental R thyroid mass (2.3 x 1.5cm) Noted during prior hospital stay -will need follow-up once discharged again for thyroid US -will check TSH and free T4  DVT prophylaxis: intermediate dose lovenox  Code Status: FULL CODE Family Communication: per PCCM  Disposition Plan: ICU  Consultants:  PCCM  Antimicrobials:  Maxipime 11/5 >  Objective: Blood pressure (!) 143/73, pulse 84, temperature 98.3 F (36.8 C), resp. rate 20, height _1  (1.651 m), weight 129.9 kg, SpO2 91 %.  Intake/Output Summary (Last 24 hours) at 09/23/2019 0718 Last data filed at 09/23/2019 8119 Gross per 24 hour  Intake 2591.2 ml  Output 500 ml  Net 2091.2 ml   Filed Weights   09/22/19 0436 09/23/19 0400  Weight: 129.9 kg 129.9 kg    Examination: General: No acute respiratory distress -on vent Lungs: Fine basilar crackles -no wheezing Cardiovascular: Regular rate and rhythm  Abdomen: Soft, morbidly obese, no appreciable mass nondistended Extremities:  trace B LE edema   CBC: Recent Labs  Lab 09/21/19 0601  09/22/19 0550 09/22/19 0608 09/23/19 0358 09/23/19 0519  WBC 7.4  --  7.8  --   --  9.6  NEUTROABS 5.9  --  6.1  --   --  7.9*  HGB 12.5   < > 12.2 13.9 13.9 12.5  HCT 45.1   < > 43.3 41.0 41.0 42.7  MCV 99.8  --  97.3  --   --  96.0  PLT 182  --  195  --   --  185   < > = values in this interval not displayed.    Basic Metabolic Panel: Recent Labs  Lab 09/21/19 0601  09/21/19 1755 09/22/19 0550 09/22/19 0608 09/23/19 0358  NA 142   < >  --  139 137 137  K 4.7   < >  --  3.7 3.6 3.9  CL 96*  --   --  93*  --   --   CO2 35*  --   --  32  --   --   GLUCOSE 116*  --   --  190*  --   --   BUN 16  --   --  18  --   --   CREATININE 0.72  --   --  0.81  --   --   CALCIUM 8.9  --   --  8.6*  --   --   MG  --   --  1.8 1.8  --   --   PHOS  --   --  2.2* 2.5  --   --    < > = values in this interval not displayed.   GFR: Estimated Creatinine Clearance: 89.2 mL/min (by C-G formula based on SCr of 0.81 mg/dL).  Liver Function Tests: Recent Labs  Lab 09/22/19 0550  AST 20  ALT 13  ALKPHOS 44  BILITOT 0.4  PROT 6.6  ALBUMIN 2.7*    HbA1C: Hemoglobin A1C  Date/Time Value Ref Range Status  02/23/2018 02:50 PM 5.6  Final   Hgb A1c MFr Bld  Date/Time Value Ref Range Status  09/10/2019 02:09 AM 6.6 (H) 4.8 - 5.6 % Final    Comment:    (NOTE) Pre diabetes:          5.7%-6.4% Diabetes:              >6.4% Glycemic control for   <7.0% adults with diabetes   10/23/2010 11:37 AM 5.9 4.6 - 6.5 % Final    Comment:    See lab report for associated comment(s)    CBG: Recent Labs  Lab 09/22/19 0425 09/22/19 0855 09/22/19 1157 09/22/19 1944 09/23/19 0338  GLUCAP 202* 191* 216* 178* 222*    Recent Results (from the past 240 hour(s))  MRSA PCR Screening     Status: None   Collection Time: 09/21/19  6:00 PM   Specimen: Nasopharyngeal  Result Value Ref Range Status   MRSA by PCR NEGATIVE NEGATIVE Final    Comment:        The GeneXpert MRSA Assay (FDA approved for NASAL specimens only), is one component of a comprehensive MRSA colonization surveillance program. It is not intended to diagnose MRSA infection nor to guide or monitor treatment for MRSA infections. Performed at [facility], [place] [address], [place], [place] [address]      Scheduled Meds: .  arformoterol  15 mcg Nebulization BID  .  atorvastatin  20 mg Per Tube q1800  . budesonide (PULMICORT) nebulizer solution  0.5 mg Nebulization BID  . chlorhexidine gluconate (MEDLINE KIT)  15 mL Mouth Rinse BID  . Chlorhexidine Gluconate Cloth  6 each Topical Daily  . clopidogrel  75 mg Per Tube Daily  . cycloSPORINE  1 drop Both Eyes BID  . enoxaparin (LOVENOX) injection  1 mg/kg Subcutaneous Q12H  . famotidine  20 mg Per Tube BID  . feeding supplement (PRO-STAT SUGAR FREE 64)  30 mL Per Tube BID  . insulin aspart  0-15 Units Subcutaneous Q4H  . ipratropium-albuterol  3 mL Nebulization Q4H  . mouth rinse  15 mL Mouth Rinse 10 times per day  . methylPREDNISolone (SOLU-MEDROL) injection  40 mg Intravenous Q12H  . senna  1 tablet Per Tube BID   Continuous Infusions: . ceFEPime (MAXIPIME) IV Stopped (09/23/19 0329)  . feeding supplement (VITAL HIGH PROTEIN) 45 mL/hr at 09/23/19 0400  . propofol (DIPRIVAN) infusion 40 mcg/kg/min (09/23/19 0624)     LOS: 2 days   Cherene Altes, MD [facility] Office  [phone] Pager - Text Page per Amion  If 7PM-7AM, please contact night-coverage per Amion 09/23/2019, 7:18 AM

## 2019-09-23 NOTE — Progress Notes (Signed)
Called pt's daughter, Pollyann Glen, to update of pt condition and plan of care. Torrie appreciative of update. Video call set up for Torrie to see pt.

## 2019-09-23 NOTE — Progress Notes (Signed)
NAME:  Karen Perez, MRN:  [id number], DOB:  10/12/1951, LOS: 2 ADMISSION DATE:  09/21/2019, CONSULTATION DATE:  11/5 REFERRING MD:  Gerre Pebbles, CHIEF COMPLAINT:  Dyspnea   Brief History   68 y/o female recently discharged from this hospital after treatment for COVID 19 pneumonia was re-admitted on 11/5 with hypercarbic respiratory failure requiring intubation and mechanical ventilation.  Past Medical History  Stroke Hypertension Diabetes mellitus COPD CHF Asthma Arthritis  Significant Hospital Events   October 29 discharged from [facility] after being treated for Covid pneumonia with Decadron and remdesivir November 5 admission >   Consults:  Pulmonary and critical care medicine  Procedures:  11/5 ETT >   Significant Diagnostic Tests:  11/6 CT angiogram chest > no PE, patchy bilateral airspace disease, worse compared to prior  Micro Data:  10/24 SARS COV 2 > positive 11/5 resp culture >   Antimicrobials:  10/24 remdesivir > 10/28 11/5 ceftriaxone >   11/5 azithro >   Interim history/subjective:  No overnight events. Unable to wean this morning. Has reached for tube on wake up.  Objective   Blood pressure 139/71, pulse 91, temperature 98.3 F (36.8 C), resp. rate (!) 30, height 5\' 5"  (1.651 m), weight 129.9 kg, SpO2 92 %.    Vent Mode: PRVC FiO2 (%):  [40 %-60 %] 40 % Set Rate:  [26 bmp] 26 bmp Vt Set:  [450 mL] 450 mL PEEP:  [10 cmH20] 10 cmH20 Plateau Pressure:  [21 cmH20-26 cmH20] 23 cmH20   Intake/Output Summary (Last 24 hours) at 09/23/2019 13/05/2019 Last data filed at 09/23/2019 13/05/2019 Gross per 24 hour  Intake 2437.12 ml  Output 500 ml  Net 1937.12 ml   Filed Weights   09/22/19 0436 09/23/19 0400  Weight: 129.9 kg 129.9 kg    Examination:  General: Obese HENT: NCAT ETT in place PULM: Chest wheezing CV: RRR, no mgr GI: BS+, soft, nontender MSK: normal bulk and tone Neuro: sedated on vent, somnolent on ventilator but will follow commands     Resolved Hospital Problem list     Assessment & Plan:  Acute hypercarbic and hypoxemic respiratory failure: progression of infiltrates from COVID on imaging, post COVID inflammatory changes? clinically seems like COPD exacerbation Continue full mechanical ventilatory support Wean PEEP and FiO2 again today, goal SaO2 > 85% VAP prevention Continuing solumedrol, monitor infiltrates and consider stopping after 5-7 days  COPD, acute exacerbation COVID markers low. Solumedrol duoneb scheduled Brovana/pulmicort  OSA Consider Heated high flow qHS after extubation  Pneumonia, at risk for pseudomonas given recent hospitalization, steroids, SNF: Cefepime per pharmacy F/U resp culture  Need for sedation for mechanical ventilation Propofol, fentanyl per PAD protocol RASS goal -1   Best practice:  Diet: start tube feeding Pain/Anxiety/Delirium protocol (if indicated): as above VAP protocol (if indicated): yes DVT prophylaxis: lovenox GI prophylaxis: famotidine Glucose control: SSI Mobility: bed rest Code Status: full Family Communication: I update  Disposition: remain in ICU  Labs   CBC: Recent Labs  Lab 09/21/19 0601 09/21/19 1650 09/22/19 0550 09/22/19 0608 09/23/19 0358 09/23/19 0519  WBC 7.4  --  7.8  --   --  9.6  NEUTROABS 5.9  --  6.1  --   --  7.9*  HGB 12.5 13.9 12.2 13.9 13.9 12.5  HCT 45.1 41.0 43.3 41.0 41.0 42.7  MCV 99.8  --  97.3  --   --  96.0  PLT 182  --  195  --   --  185    Basic Metabolic Panel: Recent Labs  Lab 09/21/19 0601 09/21/19 1650 09/21/19 1755 09/22/19 0550 09/22/19 0608 09/23/19 0358 09/23/19 0519  NA 142 139  --  139 137 137 137  K 4.7 4.0  --  3.7 3.6 3.9 4.0  CL 96*  --   --  93*  --   --  95*  CO2 35*  --   --  32  --   --  28  GLUCOSE 116*  --   --  190*  --   --  201*  BUN 16  --   --  18  --   --  23  CREATININE 0.72  --   --  0.81  --   --  0.72  CALCIUM 8.9  --   --  8.6*  --   --  8.4*  MG  --   --  1.8 1.8   --   --  2.0  PHOS  --   --  2.2* 2.5  --   --  2.5   GFR: Estimated Creatinine Clearance: 90.3 mL/min (by C-G formula based on SCr of 0.72 mg/dL). Recent Labs  Lab 09/21/19 0601 09/21/19 0819 09/22/19 0550 09/23/19 0519  PROCALCITON  --  <0.10  --   --   WBC 7.4  --  7.8 9.6    Liver Function Tests: Recent Labs  Lab 09/22/19 0550 09/23/19 0519  AST 20 16  ALT 13 12  ALKPHOS 44 45  BILITOT 0.4 0.2*  PROT 6.6 6.4*  ALBUMIN 2.7* 2.5*   No results for input(s): LIPASE, AMYLASE in the last 168 hours. No results for input(s): AMMONIA in the last 168 hours.  ABG    Component Value Date/Time   PHART 7.406 09/23/2019 0358   PCO2ART 54.7 (H) 09/23/2019 0358   PO2ART 61.0 (L) 09/23/2019 0358   HCO3 34.3 (H) 09/23/2019 0358   TCO2 36 (H) 09/23/2019 0358   O2SAT 91.0 09/23/2019 0358     Coagulation Profile: No results for input(s): INR, PROTIME in the last 168 hours.  Cardiac Enzymes: No results for input(s): CKTOTAL, CKMB, CKMBINDEX, TROPONINI in the last 168 hours.  HbA1C: Hemoglobin A1C  Date/Time Value Ref Range Status  02/23/2018 02:50 PM 5.6  Final   Hgb A1c MFr Bld  Date/Time Value Ref Range Status  09/10/2019 02:09 AM 6.6 (H) 4.8 - 5.6 % Final    Comment:    (NOTE) Pre diabetes:          5.7%-6.4% Diabetes:              >6.4% Glycemic control for   <7.0% adults with diabetes   10/23/2010 11:37 AM 5.9 4.6 - 6.5 % Final    Comment:    See lab report for associated comment(s)    CBG: Recent Labs  Lab 09/22/19 0855 09/22/19 1157 09/22/19 1944 09/23/19 0338 09/23/19 0742  GLUCAP 191* 216* 178* 222* 193*      CRITICAL CARE Performed by: Lynnell Catalanavi Beryl Balz   Total critical care time: 40 minutes  Critical care time was exclusive of separately billable procedures and treating other patients.  Critical care was necessary to treat or prevent imminent or life-threatening deterioration.  Critical care was time spent personally by me on the following  activities: development of treatment plan with patient and/or surrogate as well as nursing, discussions with consultants, evaluation of patient's response to treatment, examination of patient, obtaining history from patient or surrogate, ordering and  performing treatments and interventions, ordering and review of laboratory studies, ordering and review of radiographic studies, pulse oximetry, re-evaluation of patient's condition and participation in multidisciplinary rounds.  Kipp Brood, MD [facility] ICU Physician [place]  Pager: [phone] Mobile: [phone] After hours: [phone].

## 2019-09-24 ENCOUNTER — Inpatient Hospital Stay (HOSPITAL_COMMUNITY): Payer: Medicare Other

## 2019-09-24 LAB — POCT I-STAT 7, (LYTES, BLD GAS, ICA,H+H)
Acid-Base Excess: 10 mmol/L — ABNORMAL HIGH (ref 0.0–2.0)
Bicarbonate: 35.8 mmol/L — ABNORMAL HIGH (ref 20.0–28.0)
Calcium, Ion: 1.19 mmol/L (ref 1.15–1.40)
HCT: 39 % (ref 36.0–46.0)
Hemoglobin: 13.3 g/dL (ref 12.0–15.0)
O2 Saturation: 90 %
Patient temperature: 98.6
Potassium: 3.4 mmol/L — ABNORMAL LOW (ref 3.5–5.1)
Sodium: 140 mmol/L (ref 135–145)
TCO2: 37 mmol/L — ABNORMAL HIGH (ref 22–32)
pCO2 arterial: 53.7 mmHg — ABNORMAL HIGH (ref 32.0–48.0)
pH, Arterial: 7.432 (ref 7.350–7.450)
pO2, Arterial: 59 mmHg — ABNORMAL LOW (ref 83.0–108.0)

## 2019-09-24 LAB — GLUCOSE, CAPILLARY
Glucose-Capillary: 132 mg/dL — ABNORMAL HIGH (ref 70–99)
Glucose-Capillary: 152 mg/dL — ABNORMAL HIGH (ref 70–99)
Glucose-Capillary: 155 mg/dL — ABNORMAL HIGH (ref 70–99)
Glucose-Capillary: 158 mg/dL — ABNORMAL HIGH (ref 70–99)
Glucose-Capillary: 168 mg/dL — ABNORMAL HIGH (ref 70–99)
Glucose-Capillary: 197 mg/dL — ABNORMAL HIGH (ref 70–99)
Glucose-Capillary: 202 mg/dL — ABNORMAL HIGH (ref 70–99)

## 2019-09-24 LAB — TRIGLYCERIDES: Triglycerides: 326 mg/dL — ABNORMAL HIGH (ref <150)

## 2019-09-24 LAB — T4, FREE: Free T4: 1.38 ng/dL — ABNORMAL HIGH (ref 0.61–1.12)

## 2019-09-24 MED ORDER — GABAPENTIN 250 MG/5ML PO SOLN
300.0000 mg | Freq: Two times a day (BID) | ORAL | Status: DC
  Administered 2019-09-24 – 2019-09-26 (×4): 300 mg
  Filled 2019-09-24 (×7): qty 6

## 2019-09-24 MED ORDER — POTASSIUM CHLORIDE 20 MEQ/15ML (10%) PO SOLN
40.0000 meq | Freq: Two times a day (BID) | ORAL | Status: AC
  Administered 2019-09-24 (×2): 40 meq
  Filled 2019-09-24 (×2): qty 30

## 2019-09-24 MED ORDER — GABAPENTIN 250 MG/5ML PO SOLN
900.0000 mg | Freq: Every day | ORAL | Status: DC
  Administered 2019-09-24 – 2019-09-25 (×2): 900 mg
  Filled 2019-09-24 (×5): qty 18

## 2019-09-24 MED ORDER — POLYETHYLENE GLYCOL 3350 17 G PO PACK
17.0000 g | PACK | Freq: Two times a day (BID) | ORAL | Status: DC
  Administered 2019-09-24 – 2019-09-27 (×5): 17 g via ORAL
  Filled 2019-09-24 (×5): qty 1

## 2019-09-24 MED ORDER — IPRATROPIUM-ALBUTEROL 0.5-2.5 (3) MG/3ML IN SOLN
3.0000 mL | Freq: Four times a day (QID) | RESPIRATORY_TRACT | Status: DC
Start: 2019-09-24 — End: 2019-09-25
  Administered 2019-09-24 – 2019-09-25 (×6): 3 mL via RESPIRATORY_TRACT
  Filled 2019-09-24 (×6): qty 3

## 2019-09-24 NOTE — Progress Notes (Signed)
Pharmacy Antibiotic Note  Karen Perez is a 68 y.o. female recently discharged after treatment for COVID 19 pneumonia, now re-admitted on 09/21/2019 with respiratory failure requiring intubation.  Pharmacy has been consulted for cefepime dosing for HCAP.  Plan: Cefepime 2g IV q8h Follow up renal function & cultures, clinical progress, LOT   Height: 5\' 5"  (165.1 cm) Weight: 288 lb 2.3 oz (130.7 kg) IBW/kg (Calculated) : 57  Temp (24hrs), Avg:98.7 F (37.1 C), Min:97.7 F (36.5 C), Max:99.5 F (37.5 C)  Recent Labs  Lab 09/21/19 0601 09/22/19 0550 09/23/19 0519  WBC 7.4 7.8 9.6  CREATININE 0.72 0.81 0.72    Estimated Creatinine Clearance: 90.6 mL/min (by C-G formula based on SCr of 0.72 mg/dL).    Allergies  Allergen Reactions  . Amoxicillin   . Amoxicillin-Pot Clavulanate Diarrhea    Has patient had a PCN reaction causing immediate rash, facial/tongue/throat swelling, SOB or lightheadedness with hypotension: No Has patient had a PCN reaction causing severe rash involving mucus membranes or skin necrosis: No Has patient had a PCN reaction that required hospitalization: No Has patient had a PCN reaction occurring within the last 10 years: No If all of the above answers are "NO", then may proceed with Cephalosporin use.   . Bacitracin   . Neosporin [Neomycin-Bacitracin Zn-Polymyx] Hives    Antimicrobials this admission: 11/5 Rocephin/Zithro x 1 11/5 Cefepime >>  Dose adjustments this admission:  Microbiology results: 11/5 MRSA PCR: negative 11/5 Sputum:  Thank you for allowing pharmacy to be a part of this patient's care.  Gretta Arab PharmD, BCPS Clinical pharmacist phone 7am- 5pm: [phone] 09/24/2019 7:33 AM

## 2019-09-24 NOTE — Progress Notes (Addendum)
Ms. Whan was switched to PSV at 1930 and is resting comfortably in bed

## 2019-09-24 NOTE — Progress Notes (Signed)
Karen Perez  TGY:[id number] DOB: October 27, 1951 DOA: 09/21/2019 PCP: Lavera Guise, MD    Brief Narrative:  [phone] [facility] resident with a history of 1 L nasal cannula O2 dependent COPD, DM2, HTN, prior CVA, HLD, OSA on CPAP, multiple cerebral aneurysms requiring clipping, and chronic diastolic CHF who was discharged from [facility] 10/29 on her baseline oxygen support and returned to [facility] ED with progressive worsening shortness of breath.  In the emergency room she was found to have oxygen saturation of 70% on 2 L of nasal cannula oxygen.  Her blood pressure was stable at presentation.  A CXR in the ED noted bilateral infiltrates worse on the left.  Arrangements were made for the patient to be transferred from [facility] back to the [place] progressive care unit.  While awaiting transfer to [facility] her oxygen requirements increased from nasal cannula to HFNC, and she was ultimately no longer able to be managed with high flow nasal cannula alone.  She was intubated while still in the ED.  Significant Events: 10/24 - 10/29 admit to [facility] - d/c home  11/5 readmit to [facility] via [facility] ED - intubated in [facility] ED  COVID-19 specific Treatment: Solu-Medrol 10/24 >11/2 Remdesivir 10/24 > 10/28 Solu-Medrol 11/5 > 11/9  Subjective: Remains on ventilator on PRVC but is awake and interactive.  Is remarkably calm.  No evidence of respiratory distress or uncontrolled pain.  Will follow simple commands.  Assessment & Plan:  Acute on chronic hypoxic and hypercarbic respiratory failure Exact etiology unclear -questionable pneumonia versus COPD exacerbation - PE ruled out - vent management per PCCM - will plan to stop abx after 7 days of tx   Markedly elevated d-Dimer CT angio of the chest w/o evidence of PE - venous duplex limited but w/o evidence of DVT - changed to prophylactic dose lovenox   Recent Labs  Lab 09/22/19 0550 09/23/19 0519  DDIMER 11.79* 3.54*    Chronic  diastolic CHF Physical exam not consistent with acute CHF exacerbation - follow I's and O's and weights -keep patient on the dry side as blood pressure tolerates -patient is net positive approximately 1500 cc since admission but negative approximately 1500 cc over the last 24 hours -not significantly overloaded on exam  Hypokalemia Due to diuretic use -supplement -magnesium was normal 11/7  COPD Management per Pulmonary - continue steroids for 5 days of tx and nebulizers  OSA On ventilator at present  HTN Blood pressure stable/controlled  DM2 -uncontrolled CBG well controlled at this time  GERD Cont H2 blocker   HLD Continue statin  Hx of CVA On plavix and statin long term   Obesity - Estimated body mass index is 47.69 kg/m as calculated from the following:   Height as of an earlier encounter on 09/21/19: 5' 5"  (1.651 m).   Weight as of an earlier encounter on 09/21/19: 130 kg.  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 rate (!) 30, height 5' 5"  (1.651 m), weight 130.7 kg, SpO2 93 %.  Intake/Output Summary (Last 24 hours) at 09/24/2019 0859 Last data  filed at 09/24/2019 0800 Gross per 24 hour  Intake 2028.15 ml  Output 3520 ml  Net -1491.85 ml   Filed Weights   09/22/19 0436 09/23/19 0400 09/24/19 0500  Weight: 129.9 kg 129.9 kg 130.7 kg    Examination: General: No acute respiratory distress -much more alert today Lungs: Good air movement throughout with no wheezing Cardiovascular: RRR - no rub  Abdomen: Soft, morbidly obese, no appreciable mass  nondistended Extremities: No significant edema bilateral lower extremities  CBC: Recent Labs  Lab 09/21/19 0601  09/22/19 0550  09/23/19 0358 09/23/19 0519 09/24/19 0416  WBC 7.4  --  7.8  --   --  9.6  --   NEUTROABS 5.9  --  6.1  --   --  7.9*  --   HGB 12.5   < > 12.2   < > 13.9 12.5 13.3  HCT 45.1   < > 43.3   < > 41.0 42.7 39.0  MCV 99.8  --  97.3  --   --  96.0  --   PLT 182  --  195  --   --  185  --    < > = values in this interval not displayed.   Basic Metabolic Panel: Recent Labs  Lab 09/21/19 0601  09/21/19 1755 09/22/19 0550  09/23/19 0358 09/23/19 0519 09/24/19 0416  NA 142   < >  --  139   < > 137 137 140  K 4.7   < >  --  3.7   < > 3.9 4.0 3.4*  CL 96*  --   --  93*  --   --  95*  --   CO2 35*  --   --  32  --   --  28  --   GLUCOSE 116*  --   --  190*  --   --  201*  --   BUN 16  --   --  18  --   --  23  --   CREATININE 0.72  --   --  0.81  --   --  0.72  --   CALCIUM 8.9  --   --  8.6*  --   --  8.4*  --   MG  --   --  1.8 1.8  --   --  2.0  --   PHOS  --   --  2.2* 2.5  --   --  2.5  --    < > = values in this interval not displayed.   GFR: Estimated Creatinine Clearance: 90.6 mL/min (by C-G formula based on SCr of 0.72 mg/dL).  Liver Function Tests: Recent Labs  Lab 09/22/19 0550 09/23/19 0519  AST 20 16  ALT 13 12  ALKPHOS 44 45  BILITOT 0.4 0.2*  PROT 6.6 6.4*  ALBUMIN 2.7* 2.5*    HbA1C: Hemoglobin A1C  Date/Time Value Ref Range Status  02/23/2018 02:50 PM 5.6  Final   Hgb A1c MFr Bld  Date/Time Value Ref Range Status  09/10/2019 02:09 AM 6.6 (H) 4.8 - 5.6 % Final    Comment:    (NOTE) Pre diabetes:          5.7%-6.4% Diabetes:              >6.4% Glycemic control for   <7.0% adults with diabetes   10/23/2010 11:37 AM 5.9 4.6 - 6.5 % Final    Comment:    See lab report  for associated comment(s)    CBG: Recent Labs  Lab 09/23/19 1527 09/23/19 2004 09/23/19 2349 09/24/19 0507 09/24/19 0750  GLUCAP 209* 126* 152*  132* 155*    Recent Results (from the past 240 hour(s))  MRSA PCR Screening     Status: None   Collection Time: 09/21/19  6:00 PM   Specimen: Nasopharyngeal  Result Value Ref Range Status   MRSA by PCR NEGATIVE NEGATIVE Final    Comment:        The GeneXpert MRSA Assay (FDA approved for NASAL specimens only), is one component of a comprehensive MRSA colonization surveillance program. It is not intended to diagnose MRSA infection nor to guide or monitor treatment for MRSA infections. Performed at [facility], [place] [address], [place], [place] [address]      Scheduled Meds: . arformoterol  15 mcg Nebulization BID  . atorvastatin  20 mg Per Tube q1800  . budesonide (PULMICORT) nebulizer solution  0.5 mg Nebulization BID  . chlorhexidine gluconate (MEDLINE KIT)  15 mL Mouth Rinse BID  . Chlorhexidine Gluconate Cloth  6 each Topical Daily  . clopidogrel  75 mg Per Tube Daily  . cycloSPORINE  1 drop Both Eyes BID  . enoxaparin (LOVENOX) injection  65 mg Subcutaneous Q12H  . famotidine  20 mg Per Tube BID  . feeding supplement (PRO-STAT SUGAR FREE 64)  30 mL Per Tube BID  . insulin aspart  0-20 Units Subcutaneous Q4H  . ipratropium-albuterol  3 mL Nebulization Q4H  . mouth rinse  15 mL Mouth Rinse 10 times per day  . methylPREDNISolone (SOLU-MEDROL) injection  40 mg Intravenous Daily  . QUEtiapine  25 mg Oral BID  . senna  1 tablet Per Tube BID   Continuous Infusions: . ceFEPime (MAXIPIME) IV 2 g (09/24/19 0343)  . feeding supplement (VITAL HIGH PROTEIN) 45 mL/hr at 09/24/19 0600  . propofol (DIPRIVAN) infusion 20 mcg/kg/min (09/24/19 0800)     LOS: 3 days   Cherene Altes, MD [facility] Office  [phone] Pager - Text Page per Amion  If 7PM-7AM, please contact night-coverage per Amion 09/24/2019, 8:59 AM

## 2019-09-24 NOTE — Progress Notes (Addendum)
NAME:  Karen PolingLinda J Perez, MRN:  [id number], DOB:  12/11/1950, LOS: 3 ADMISSION DATE:  09/21/2019, CONSULTATION DATE:  11/5 REFERRING MD:  [name], CHIEF COMPLAINT:  Dyspnea   Brief History   68 y/o female recently discharged from this hospital after treatment for COVID 19 pneumonia was re-admitted on 11/5 with hypercarbic respiratory failure requiring intubation and mechanical ventilation.  Past Medical History  Stroke Hypertension Diabetes mellitus COPD CHF Asthma Arthritis  Significant Hospital Events   October 29 [facility] after being treated for Covid pneumonia with Decadron and remdesivir November 5 admission >   Consults:  Pulmonary and critical care medicine  Procedures:  11/5 ETT >   Significant Diagnostic Tests:  11/6 CT angiogram chest > no PE, patchy bilateral airspace disease, worse compared to prior 11/8 chest x-ray-mild bilateral interstitial pattern consistent with CHF Micro Data:  10/24 SARS COV 2 > positive 11/5 resp culture >  No results  Antimicrobials:  10/24 remdesivir > 10/28 11/5 ceftriaxone >   11/5 azithro >   Interim history/subjective:  Wakes up and follows commands off sedation.  Rapid shallow breathing on SBT today.  Objective   Blood pressure 124/69, pulse 94, temperature 98.5 F (36.9 C), temperature source Oral, resp. rate (!) 30, height 5\' 5"  (1.651 m), weight 130.7 kg, SpO2 93 %.    Vent Mode: PRVC FiO2 (%):  [40 %] 40 % Set Rate:  [26 bmp] 26 bmp Vt Set:  [450 mL] 450 mL PEEP:  [10 cmH20] 10 cmH20 Pressure Support:  [10 cmH20] 10 cmH20 Plateau Pressure:  [21 cmH20-24 cmH20] 24 cmH20   Intake/Output Summary (Last 24 hours) at 09/24/2019 0802 Last data filed at 09/24/2019 0600 Gross per 24 hour  Intake 1873.38 ml  Output 3520 ml  Net -1646.62 ml   Filed Weights   09/22/19 0436 09/23/19 0400 09/24/19 0500  Weight: 129.9 kg 129.9 kg 130.7 kg    Examination:  General: Obese HENT:ETT/OGT in place  with no skin breakdown. PULM: chest clear with no wheezing. Maintaining saturation  With PEEP at 8. Minimal secretions. CV: RRR, no mgr, no edema GI: BS+, soft, nontender MSK: normal bulk and tone Neuro: sedated on vent, somnolent on ventilator but will follow commands   Resolved Hospital Problem list     Assessment & Plan:  Acute hypercarbic and hypoxemic respiratory failure: progression of infiltrates from COVID on imaging, post COVID inflammatory changes? clinically seems like COPD exacerbation Poor baseline pulmonary function (followed by Dr. Craige CottaSood in the office) sleep apnea with noncompliance and chronic hypoxemia. -Continue full mechanical ventilatory support -Wean PEEP and FiO2 again today, goal SaO2 > 88% -VAP prevention -Complete 5-day course of Solu-Medrol  -No diuresis as appears euvolemic -At high risk for requiring tracheostomy (currently ventilator day 3)  COPD, acute exacerbation COVID markers low. -Solumedrol -duoneb scheduled -Brovana/pulmicort  OSA -Consider Heated high flow qHS after extubation  Pneumonia, at risk for pseudomonas given recent hospitalization, steroids, SNF: -Cefepime per pharmacy to complete 7 days -F/U resp culture, no results so far, so may not have been sent.  We will continue empiric coverage  Need for sedation for mechanical ventilation - Sedation interruption today - Attempt to transition to intermittent sedation.   Best practice:  Diet: start tube feeding Pain/Anxiety/Delirium protocol (if indicated): intermittent fentanyl for tube tolerance  VAP protocol (if indicated): yes DVT prophylaxis: lovenox GI prophylaxis: famotidine Glucose control: SSI Mobility: bed rest Code Status: full Family Communication: Dr. Denese KillingsAgarwala updated her daughter Karen Fermoina  on 11/08.  She endorsed a history of increasing functional decline.  Some of her noncompliance and impulsiveness may be secondary to prior cerebral aneurysm surgery and subarachnoid  hemorrhage.  Dr. Lynetta Mare informed her that while he is hopeful that she will be extubated in the next few days there is a possibility that her current condition is a manifestation of a further decline in her overall respiratory status and that tracheostomy as a bridge to recovery may be a possibility.  He also warmed of the possibility that she may require a long-term mechanical ventilation further diminishing her quality of life and independence. Disposition: remain in ICU  Labs   CBC: Recent Labs  Lab 09/21/19 0601  09/22/19 0550 09/22/19 0608 09/23/19 0358 09/23/19 0519 09/24/19 0416  WBC 7.4  --  7.8  --   --  9.6  --   NEUTROABS 5.9  --  6.1  --   --  7.9*  --   HGB 12.5   < > 12.2 13.9 13.9 12.5 13.3  HCT 45.1   < > 43.3 41.0 41.0 42.7 39.0  MCV 99.8  --  97.3  --   --  96.0  --   PLT 182  --  195  --   --  185  --    < > = values in this interval not displayed.    Basic Metabolic Panel: Recent Labs  Lab 09/21/19 0601  09/21/19 1755 09/22/19 0550 09/22/19 0608 09/23/19 0358 09/23/19 0519 09/24/19 0416  NA 142   < >  --  139 137 137 137 140  K 4.7   < >  --  3.7 3.6 3.9 4.0 3.4*  CL 96*  --   --  93*  --   --  95*  --   CO2 35*  --   --  32  --   --  28  --   GLUCOSE 116*  --   --  190*  --   --  201*  --   BUN 16  --   --  18  --   --  23  --   CREATININE 0.72  --   --  0.81  --   --  0.72  --   CALCIUM 8.9  --   --  8.6*  --   --  8.4*  --   MG  --   --  1.8 1.8  --   --  2.0  --   PHOS  --   --  2.2* 2.5  --   --  2.5  --    < > = values in this interval not displayed.   GFR: Estimated Creatinine Clearance: 90.6 mL/min (by C-G formula based on SCr of 0.72 mg/dL). Recent Labs  Lab 09/21/19 0601 09/21/19 0819 09/22/19 0550 09/23/19 0519  PROCALCITON  --  <0.10  --   --   WBC 7.4  --  7.8 9.6    Liver Function Tests: Recent Labs  Lab 09/22/19 0550 09/23/19 0519  AST 20 16  ALT 13 12  ALKPHOS 44 45  BILITOT 0.4 0.2*  PROT 6.6 6.4*  ALBUMIN 2.7*  2.5*   No results for input(s): LIPASE, AMYLASE in the last 168 hours. No results for input(s): AMMONIA in the last 168 hours.  ABG    Component Value Date/Time   PHART 7.432 09/24/2019 0416   PCO2ART 53.7 (H) 09/24/2019 0416   PO2ART 59.0 (L) 09/24/2019 2841  HCO3 35.8 (H) 09/24/2019 0416   TCO2 37 (H) 09/24/2019 0416   O2SAT 90.0 09/24/2019 0416     Coagulation Profile: No results for input(s): INR, PROTIME in the last 168 hours.  Cardiac Enzymes: No results for input(s): CKTOTAL, CKMB, CKMBINDEX, TROPONINI in the last 168 hours.  HbA1C: Hemoglobin A1C  Date/Time Value Ref Range Status  02/23/2018 02:50 PM 5.6  Final   Hgb A1c MFr Bld  Date/Time Value Ref Range Status  09/10/2019 02:09 AM 6.6 (H) 4.8 - 5.6 % Final    Comment:    (NOTE) Pre diabetes:          5.7%-6.4% Diabetes:              >6.4% Glycemic control for   <7.0% adults with diabetes   10/23/2010 11:37 AM 5.9 4.6 - 6.5 % Final    Comment:    See lab report for associated comment(s)    CBG: Recent Labs  Lab 09/23/19 1124 09/23/19 1527 09/23/19 2004 09/23/19 2349 09/24/19 0507  GLUCAP 250* 209* 126* 152* 132*      CRITICAL CARE Performed by: Lynnell Catalan   Total critical care time: 40 minutes  Critical care time was exclusive of separately billable procedures and treating other patients.  Critical care was necessary to treat or prevent imminent or life-threatening deterioration.  Critical care was time spent personally by me on the following activities: development of treatment plan with patient and/or surrogate as well as nursing, discussions with consultants, evaluation of patient's response to treatment, examination of patient, obtaining history from patient or surrogate, ordering and performing treatments and interventions, ordering and review of laboratory studies, ordering and review of radiographic studies, pulse oximetry, re-evaluation of patient's condition and participation in  multidisciplinary rounds.  Lynnell Catalan, MD [facility] ICU Physician [facility] [place] Critical Care  Pager: [phone] Mobile: [phone] After hours: [phone].

## 2019-09-24 NOTE — Progress Notes (Signed)
20 ml of Propofol wasted in stericycle. Witnessed by Darden Amber RN.

## 2019-09-25 LAB — COMPREHENSIVE METABOLIC PANEL
ALT: 22 U/L (ref 0–44)
AST: 29 U/L (ref 15–41)
Albumin: 2.6 g/dL — ABNORMAL LOW (ref 3.5–5.0)
Alkaline Phosphatase: 48 U/L (ref 38–126)
Anion gap: 10 (ref 5–15)
BUN: 31 mg/dL — ABNORMAL HIGH (ref 8–23)
CO2: 30 mmol/L (ref 22–32)
Calcium: 8.9 mg/dL (ref 8.9–10.3)
Chloride: 104 mmol/L (ref 98–111)
Creatinine, Ser: 0.69 mg/dL (ref 0.44–1.00)
GFR calc Af Amer: >60 mL/min (ref >60)
GFR calc non Af Amer: >60 mL/min (ref >60)
Glucose, Bld: 122 mg/dL — ABNORMAL HIGH (ref 70–99)
Potassium: 4.8 mmol/L (ref 3.5–5.1)
Sodium: 144 mmol/L (ref 135–145)
Total Bilirubin: 0.4 mg/dL (ref 0.3–1.2)
Total Protein: 6.5 g/dL (ref 6.5–8.1)

## 2019-09-25 LAB — CBC WITH DIFFERENTIAL/PLATELET
Abs Immature Granulocytes: 0.1 10*3/uL — ABNORMAL HIGH (ref 0.00–0.07)
Basophils Absolute: 0 10*3/uL (ref 0.0–0.1)
Basophils Relative: 0 %
Eosinophils Absolute: 0 10*3/uL (ref 0.0–0.5)
Eosinophils Relative: 0 %
HCT: 42.7 % (ref 36.0–46.0)
Hemoglobin: 12.4 g/dL (ref 12.0–15.0)
Immature Granulocytes: 1 %
Lymphocytes Relative: 12 %
Lymphs Abs: 1.2 10*3/uL (ref 0.7–4.0)
MCH: 28.1 pg (ref 26.0–34.0)
MCHC: 29 g/dL — ABNORMAL LOW (ref 30.0–36.0)
MCV: 96.8 fL (ref 80.0–100.0)
Monocytes Absolute: 0.9 10*3/uL (ref 0.1–1.0)
Monocytes Relative: 9 %
Neutro Abs: 8 10*3/uL — ABNORMAL HIGH (ref 1.7–7.7)
Neutrophils Relative %: 78 %
Platelets: 189 10*3/uL (ref 150–400)
RBC: 4.41 MIL/uL (ref 3.87–5.11)
RDW: 16.2 % — ABNORMAL HIGH (ref 11.5–15.5)
WBC: 10.3 10*3/uL (ref 4.0–10.5)
nRBC: 0 % (ref 0.0–0.2)

## 2019-09-25 LAB — POCT I-STAT 7, (LYTES, BLD GAS, ICA,H+H)
Acid-Base Excess: 9 mmol/L — ABNORMAL HIGH (ref 0.0–2.0)
Bicarbonate: 34.1 mmol/L — ABNORMAL HIGH (ref 20.0–28.0)
Calcium, Ion: 1.21 mmol/L (ref 1.15–1.40)
HCT: 38 % (ref 36.0–46.0)
Hemoglobin: 12.9 g/dL (ref 12.0–15.0)
O2 Saturation: 93 %
Patient temperature: 98.6
Potassium: 4.6 mmol/L (ref 3.5–5.1)
Sodium: 140 mmol/L (ref 135–145)
TCO2: 36 mmol/L — ABNORMAL HIGH (ref 22–32)
pCO2 arterial: 49.8 mmHg — ABNORMAL HIGH (ref 32.0–48.0)
pH, Arterial: 7.443 (ref 7.350–7.450)
pO2, Arterial: 67 mmHg — ABNORMAL LOW (ref 83.0–108.0)

## 2019-09-25 LAB — GLUCOSE, CAPILLARY
Glucose-Capillary: 130 mg/dL — ABNORMAL HIGH (ref 70–99)
Glucose-Capillary: 138 mg/dL — ABNORMAL HIGH (ref 70–99)
Glucose-Capillary: 142 mg/dL — ABNORMAL HIGH (ref 70–99)
Glucose-Capillary: 166 mg/dL — ABNORMAL HIGH (ref 70–99)
Glucose-Capillary: 213 mg/dL — ABNORMAL HIGH (ref 70–99)
Glucose-Capillary: 219 mg/dL — ABNORMAL HIGH (ref 70–99)
Glucose-Capillary: 228 mg/dL — ABNORMAL HIGH (ref 70–99)

## 2019-09-25 LAB — MAGNESIUM: Magnesium: 2.3 mg/dL (ref 1.7–2.4)

## 2019-09-25 LAB — TRIGLYCERIDES: Triglycerides: 198 mg/dL — ABNORMAL HIGH (ref <150)

## 2019-09-25 LAB — BRAIN NATRIURETIC PEPTIDE: B Natriuretic Peptide: 37.4 pg/mL (ref 0.0–100.0)

## 2019-09-25 MED ORDER — IPRATROPIUM-ALBUTEROL 0.5-2.5 (3) MG/3ML IN SOLN
3.0000 mL | Freq: Three times a day (TID) | RESPIRATORY_TRACT | Status: DC
  Administered 2019-09-26: 3 mL via RESPIRATORY_TRACT
  Filled 2019-09-25: qty 3

## 2019-09-25 MED ORDER — FUROSEMIDE 10 MG/ML IJ SOLN
40.0000 mg | Freq: Once | INTRAMUSCULAR | Status: AC
  Administered 2019-09-25: 40 mg via INTRAVENOUS
  Filled 2019-09-25: qty 4

## 2019-09-25 NOTE — Progress Notes (Signed)
ABG obtained on the following setting: PRVC26/450/50/+8

## 2019-09-25 NOTE — Progress Notes (Signed)
Assisted tele visit to patient with family member. Daughter unable to join call at this time and has asked for [place] to attempt again in 30 mins when shes at a better position to take calls.  Lenox Ahr, RN

## 2019-09-25 NOTE — Progress Notes (Signed)
Karen Perez  VHQ:[id number] DOB: 25-Feb-1951 DOA: 09/21/2019 PCP: Lavera Guise, MD    Brief Narrative:  [phone] [facility] resident with a history of 1 L nasal cannula O2 dependent COPD, DM2, HTN, prior CVA, HLD, OSA on CPAP, multiple cerebral aneurysms requiring clipping, and chronic diastolic CHF who was discharged from [facility] 10/29 on her baseline oxygen support and returned to [facility] ED with progressive worsening shortness of breath.  In the emergency room she was found to have oxygen saturation of 70% on 2 L of nasal cannula oxygen.  Her blood pressure was stable at presentation.  A CXR in the ED noted bilateral infiltrates worse on the left.  Arrangements were made for the patient to be transferred from [facility] back to the [place] progressive care unit.  While awaiting transfer to [facility] her oxygen requirements increased from nasal cannula to HFNC, and she was ultimately no longer able to be managed with high flow nasal cannula alone.  She was intubated while still in the ED.  Significant Events: 10/24 - 10/29 admit to [facility] - d/c home  11/5 readmit to [facility] via [facility] ED - intubated in [facility] ED  COVID-19 specific Treatment: Solu-Medrol 10/24 >11/2 Remdesivir 10/24 > 10/28 Solu-Medrol 11/5 > 11/9  Subjective: Is alert and interactive and appears comfortable on the ventilator.  Some intermittent desaturations are apparently interfering with attempts to wean.  Assessment & Plan:  Acute on chronic hypoxic and hypercarbic respiratory failure Exact etiology unclear -questionable pneumonia versus COPD exacerbation - PE ruled out - vent management per PCCM - will plan to stop abx after 7 days of tx -possible trial of extubation 11/10  Markedly elevated d-Dimer CT angio of the chest w/o evidence of PE - venous duplex limited but w/o evidence of DVT - changed to prophylactic dose lovenox   Chronic diastolic CHF Physical exam not consistent with acute CHF  exacerbation - follow I's and O's and weights - keep patient on the dry side as blood pressure tolerates -patient is net -500 cc since admission and net -1.2 L over last 24 hours - not significantly overloaded on exam  Hypokalemia Due to diuretic use -corrected with supplementation -magnesium normal  COPD Management per Pulmonary - continue steroids for 5 days of tx (ends today) and nebulizers  OSA On ventilator at present  HTN Blood pressure stable/controlled  DM2 -uncontrolled CBG well controlled at this time -steroid to be stopped after last dose today  GERD Cont H2 blocker   HLD Continue statin  Hx of CVA On plavix and statin long term   Obesity - Estimated body mass index is 47.69 kg/m as calculated from the following:   Height as of an earlier encounter on 09/21/19: _0  (1.651 m).   Weight as of an earlier encounter on 09/21/19: 130 kg.  Incidental R thyroid mass (2.3 x 1.5cm) Noted during prior hospital stay -will need follow-up once discharged again for thyroid US - TSH is low - free T4 pending  DVT prophylaxis: intermediate dose lovenox  Code Status: FULL CODE Family Communication: per PCCM  Disposition Plan: ICU  Consultants:  PCCM  Antimicrobials:  Maxipime 11/5 >  Objective: Blood pressure (!) 127/101, pulse 86, temperature 99.5 F (37.5 C), temperature source Oral, resp. rate (!) 29, height _1  (1.651 m), weight 128.8 kg, SpO2 92 %.  Intake/Output Summary (Last 24 hours) at 09/25/2019 0857 Last data filed at 09/25/2019 0600 Gross per 24 hour  Intake 1003 ml  Output 1770 ml  Net -767 ml   Filed Weights   09/23/19 0400 09/24/19 0500 09/25/19 0442  Weight: 129.9 kg 130.7 kg 128.8 kg    Examination: General: Alert and calm on ventilator Lungs: Good air movement throughout  Cardiovascular: RRR  Abdomen: Soft, morbidly obese, no appreciable mass Extremities: No significant edema B LE   CBC: Recent Labs  Lab 09/22/19 0550  09/23/19  0519 09/24/19 0416 09/25/19 0330 09/25/19 0530  WBC 7.8  --  9.6  --   --  10.3  NEUTROABS 6.1  --  7.9*  --   --  8.0*  HGB 12.2   < > 12.5 13.3 12.9 12.4  HCT 43.3   < > 42.7 39.0 38.0 42.7  MCV 97.3  --  96.0  --   --  96.8  PLT 195  --  185  --   --  189   < > = values in this interval not displayed.   Basic Metabolic Panel: Recent Labs  Lab 09/21/19 1755 09/22/19 0550  09/23/19 0519 09/24/19 0416 09/25/19 0330 09/25/19 0530  NA  --  139   < > 137 140 140 144  K  --  3.7   < > 4.0 3.4* 4.6 4.8  CL  --  93*  --  95*  --   --  104  CO2  --  32  --  28  --   --  30  GLUCOSE  --  190*  --  201*  --   --  122*  BUN  --  18  --  23  --   --  31*  CREATININE  --  0.81  --  0.72  --   --  0.69  CALCIUM  --  8.6*  --  8.4*  --   --  8.9  MG 1.8 1.8  --  2.0  --   --  2.3  PHOS 2.2* 2.5  --  2.5  --   --   --    < > = values in this interval not displayed.   GFR: Estimated Creatinine Clearance: 89.8 mL/min (by C-G formula based on SCr of 0.69 mg/dL).  Liver Function Tests: Recent Labs  Lab 09/22/19 0550 09/23/19 0519 09/25/19 0530  AST _0 ALT _1 ALKPHOS 44 45 48  BILITOT 0.4 0.2* 0.4  PROT 6.6 6.4* 6.5  ALBUMIN 2.7* 2.5* 2.6*    HbA1C: Hemoglobin A1C  Date/Time Value Ref Range Status  02/23/2018 02:50 PM 5.6  Final   Hgb A1c MFr Bld  Date/Time Value Ref Range Status  09/10/2019 02:09 AM 6.6 (H) 4.8 - 5.6 % Final    Comment:    (NOTE) Pre diabetes:          5.7%-6.4% Diabetes:              >6.4% Glycemic control for   <7.0% adults with diabetes   10/23/2010 11:37 AM 5.9 4.6 - 6.5 % Final    Comment:    See lab report for associated comment(s)    CBG: Recent Labs  Lab 09/24/19 1642 09/24/19 1937 09/25/19 0036 09/25/19 0434 09/25/19 0835  GLUCAP 197* 168* 130* 142* 138*    Recent Results (from the past 240 hour(s))  MRSA PCR Screening     Status: None   Collection Time: 09/21/19  6:00 PM   Specimen: Nasopharyngeal  Result  Value Ref Range  Status   MRSA by PCR NEGATIVE NEGATIVE Final    Comment:        The GeneXpert MRSA Assay (FDA approved for NASAL specimens only), is one component of a comprehensive MRSA colonization surveillance program. It is not intended to diagnose MRSA infection nor to guide or monitor treatment for MRSA infections. Performed at [facility], [place] [address] [place], [place] [address]      Scheduled Meds: . arformoterol  15 mcg Nebulization BID  . atorvastatin  20 mg Per Tube q1800  . budesonide (PULMICORT) nebulizer solution  0.5 mg Nebulization BID  . chlorhexidine gluconate (MEDLINE KIT)  15 mL Mouth Rinse BID  . Chlorhexidine Gluconate Cloth  6 each Topical Daily  . clopidogrel  75 mg Per Tube Daily  . cycloSPORINE  1 drop Both Eyes BID  . enoxaparin (LOVENOX) injection  65 mg Subcutaneous Q12H  . famotidine  20 mg Per Tube BID  . feeding supplement (PRO-STAT SUGAR FREE 64)  30 mL Per Tube BID  . gabapentin  300 mg Per Tube BID   And  . gabapentin  900 mg Per Tube QHS  . insulin aspart  0-20 Units Subcutaneous Q4H  . ipratropium-albuterol  3 mL Nebulization Q6H  . mouth rinse  15 mL Mouth Rinse 10 times per day  . polyethylene glycol  17 g Oral BID  . QUEtiapine  25 mg Oral BID  . senna  1 tablet Per Tube BID     LOS: 4 days   Cherene Altes, MD [facility] Office  [phone] Pager - Text Page per Amion  If 7PM-7AM, please contact night-coverage per Amion 09/25/2019, 8:57 AM

## 2019-09-25 NOTE — Progress Notes (Signed)
NAME:  Karen Perez, MRN:  [id number], DOB:  1951-10-30, LOS: 4 ADMISSION DATE:  09/21/2019, CONSULTATION DATE:  11/5 REFERRING MD:  Donna Christen, CHIEF COMPLAINT:  Dyspnea   Brief History   68 y/o female recently discharged from this hospital after treatment for COVID 19 pneumonia was re-admitted on 11/5 with hypercarbic respiratory failure requiring intubation and mechanical ventilation.  Past Medical History  Stroke Hypertension Diabetes mellitus COPD CHF Asthma Arthritis  Significant Hospital Events   October 29 discharged from [facility] after being treated for Covid pneumonia with Decadron and remdesivir November 5 admission >   Consults:  Pulmonary and critical care medicine  Procedures:  11/5 ETT >   Significant Diagnostic Tests:  11/6 CT angiogram chest > no PE, patchy bilateral airspace disease, worse compared to prior 11/8 chest x-ray-mild bilateral interstitial pattern consistent with CHF Micro Data:  10/24 SARS COV 2 > positive 11/5 resp culture >  No results  Antimicrobials:  10/24 remdesivir > 10/28 11/5 ceftriaxone >   11/5 azithro >   Interim history/subjective:  Wakes up and follows commands off sedation.  Tolerated SBT for 2h prior to mild desaturation.  Objective   Blood pressure (!) 141/64, pulse 90, temperature 99.5 F (37.5 C), temperature source Oral, resp. rate (!) 30, height 5\' 5"  (1.651 m), weight 128.8 kg, SpO2 (!) 89 %.    Vent Mode: PRVC FiO2 (%):  [40 %-50 %] 50 % Set Rate:  [26 bmp] 26 bmp Vt Set:  [450 mL] 450 mL PEEP:  [5 cmH20-8 cmH20] 5 cmH20 Pressure Support:  [6 cmH20-15 cmH20] 6 cmH20 Plateau Pressure:  [18 cmH20-23 cmH20] 23 cmH20   Intake/Output Summary (Last 24 hours) at 09/25/2019 1159 Last data filed at 09/25/2019 0600 Gross per 24 hour  Intake 911.73 ml  Output 1770 ml  Net -858.27 ml   Filed Weights   09/23/19 0400 09/24/19 0500 09/25/19 0442  Weight: 129.9 kg 130.7 kg 128.8 kg    Examination:   General: Obese HENT:ETT/OGT in place with no skin breakdown. PULM: chest clear with no wheezing. Maintaining saturation  With PEEP at 8. Minimal secretions. CV: RRR, no mgr, no edema GI: BS+, soft, nontender MSK: normal bulk and tone Neuro: awake and cooperative. Follows basic commands and able to tolerate sitting position.   Resolved Hospital Problem list     Assessment & Plan:  Acute hypercarbic and hypoxemic respiratory failure: progression of infiltrates from COVID on imaging, post COVID inflammatory changes? clinically seems like COPD exacerbation Poor baseline pulmonary function (followed by Dr. Halford Chessman in the office) sleep apnea with noncompliance and chronic hypoxemia. -Daily SBT- likely trial of extubation tomorrow. -Complete 5-day course of Solu-Medrol  -Trial of diuresis -At high risk for requiring tracheostomy (currently ventilator day 3)  COPD, acute exacerbation COVID markers low. -Solumedrol -duoneb scheduled -Brovana/pulmicort  OSA -Consider Heated high flow qHS after extubation  Pneumonia, at risk for pseudomonas given recent hospitalization, steroids, SNF: -Cefepime per pharmacy to complete 7 days -F/U resp culture, no results so far, so may not have been sent.  We will continue empiric coverage  Need for sedation for mechanical ventilation - Sedation interruption today - Attempt to transition to intermittent sedation.   Best practice:  Diet: start tube feeding Pain/Anxiety/Delirium protocol (if indicated): intermittent fentanyl for tube tolerance  VAP protocol (if indicated): yes DVT prophylaxis: lovenox GI prophylaxis: famotidine Glucose control: SSI Mobility: bed rest Code Status: full Family Communication: Dr. Lynetta Mare updated her daughter [name] on 11/08.  She endorsed a history of increasing functional decline.  Some of her noncompliance and impulsiveness may be secondary to prior cerebral aneurysm surgery and subarachnoid hemorrhage.  Dr. Denese Killings  informed her that while he is hopeful that she will be extubated in the next few days there is a possibility that her current condition is a manifestation of a further decline in her overall respiratory status and that tracheostomy as a bridge to recovery may be a possibility.  He also warmed of the possibility that she may require a long-term mechanical ventilation further diminishing her quality of life and independence. Disposition: remain in ICU  Labs   CBC: Recent Labs  Lab 09/21/19 0601  09/22/19 0550  09/23/19 0358 09/23/19 0519 09/24/19 0416 09/25/19 0330 09/25/19 0530  WBC 7.4  --  7.8  --   --  9.6  --   --  10.3  NEUTROABS 5.9  --  6.1  --   --  7.9*  --   --  8.0*  HGB 12.5   < > 12.2   < > 13.9 12.5 13.3 12.9 12.4  HCT 45.1   < > 43.3   < > 41.0 42.7 39.0 38.0 42.7  MCV 99.8  --  97.3  --   --  96.0  --   --  96.8  PLT 182  --  195  --   --  185  --   --  189   < > = values in this interval not displayed.    Basic Metabolic Panel: Recent Labs  Lab 09/21/19 0601  09/21/19 1755 09/22/19 0550  09/23/19 0358 09/23/19 0519 09/24/19 0416 09/25/19 0330 09/25/19 0530  NA 142   < >  --  139   < > 137 137 140 140 144  K 4.7   < >  --  3.7   < > 3.9 4.0 3.4* 4.6 4.8  CL 96*  --   --  93*  --   --  95*  --   --  104  CO2 35*  --   --  32  --   --  28  --   --  30  GLUCOSE 116*  --   --  190*  --   --  201*  --   --  122*  BUN 16  --   --  18  --   --  23  --   --  31*  CREATININE 0.72  --   --  0.81  --   --  0.72  --   --  0.69  CALCIUM 8.9  --   --  8.6*  --   --  8.4*  --   --  8.9  MG  --   --  1.8 1.8  --   --  2.0  --   --  2.3  PHOS  --   --  2.2* 2.5  --   --  2.5  --   --   --    < > = values in this interval not displayed.   GFR: Estimated Creatinine Clearance: 89.8 mL/min (by C-G formula based on SCr of 0.69 mg/dL). Recent Labs  Lab 09/21/19 0601 09/21/19 0819 09/22/19 0550 09/23/19 0519 09/25/19 0530  PROCALCITON  --  <0.10  --   --   --   WBC 7.4   --  7.8 9.6 10.3    Liver Function Tests: Recent Labs  Lab 09/22/19  [id number] 09/23/19 0519 09/25/19 0530  AST 20 16 29   ALT 13 12 22   ALKPHOS 44 45 48  BILITOT 0.4 0.2* 0.4  PROT 6.6 6.4* 6.5  ALBUMIN 2.7* 2.5* 2.6*   No results for input(s): LIPASE, AMYLASE in the last 168 hours. No results for input(s): AMMONIA in the last 168 hours.  ABG    Component Value Date/Time   PHART 7.443 09/25/2019 0330   PCO2ART 49.8 (H) 09/25/2019 0330   PO2ART 67.0 (L) 09/25/2019 0330   HCO3 34.1 (H) 09/25/2019 0330   TCO2 36 (H) 09/25/2019 0330   O2SAT 93.0 09/25/2019 0330     Coagulation Profile: No results for input(s): INR, PROTIME in the last 168 hours.  Cardiac Enzymes: No results for input(s): CKTOTAL, CKMB, CKMBINDEX, TROPONINI in the last 168 hours.  HbA1C: Hemoglobin A1C  Date/Time Value Ref Range Status  02/23/2018 02:50 PM 5.6  Final   Hgb A1c MFr Bld  Date/Time Value Ref Range Status  09/10/2019 02:09 AM 6.6 (H) 4.8 - 5.6 % Final    Comment:    (NOTE) Pre diabetes:          5.7%-6.4% Diabetes:              >6.4% Glycemic control for   <7.0% adults with diabetes   10/23/2010 11:37 AM 5.9 4.6 - 6.5 % Final    Comment:    See lab report for associated comment(s)    CBG: Recent Labs  Lab 09/24/19 1642 09/24/19 1937 09/25/19 0036 09/25/19 0434 09/25/19 0835  GLUCAP 197* 168* 130* 142* 138*      CRITICAL CARE Performed by: Lynnell Catalanavi Rayshad Riviello   Total critical care time: 35 minutes  Critical care time was exclusive of separately billable procedures and treating other patients.  Critical care was necessary to treat or prevent imminent or life-threatening deterioration.  Critical care was time spent personally by me on the following activities: development of treatment plan with patient and/or surrogate as well as nursing, discussions with consultants, evaluation of patient's response to treatment, examination of patient, obtaining history from patient or  surrogate, ordering and performing treatments and interventions, ordering and review of laboratory studies, ordering and review of radiographic studies, pulse oximetry, re-evaluation of patient's condition and participation in multidisciplinary rounds.  Lynnell Catalanavi Corley Kohls, MD [facility] ICU Physician [facility]  Pager: [phone] Mobile: [phone] After hours: [phone].

## 2019-09-25 NOTE — Progress Notes (Signed)
Assisted tele visit to patient with daughter and family member.  Vincenzina Jagoda M, RN  

## 2019-09-26 LAB — GLUCOSE, CAPILLARY
Glucose-Capillary: 112 mg/dL — ABNORMAL HIGH (ref 70–99)
Glucose-Capillary: 137 mg/dL — ABNORMAL HIGH (ref 70–99)
Glucose-Capillary: 145 mg/dL — ABNORMAL HIGH (ref 70–99)
Glucose-Capillary: 160 mg/dL — ABNORMAL HIGH (ref 70–99)
Glucose-Capillary: 160 mg/dL — ABNORMAL HIGH (ref 70–99)
Glucose-Capillary: 95 mg/dL (ref 70–99)

## 2019-09-26 MED ORDER — FAMOTIDINE 20 MG PO TABS
20.0000 mg | ORAL_TABLET | Freq: Two times a day (BID) | ORAL | Status: DC
  Administered 2019-09-27 – 2019-10-02 (×11): 20 mg via ORAL
  Filled 2019-09-26 (×11): qty 1

## 2019-09-26 MED ORDER — GABAPENTIN 300 MG PO CAPS
300.0000 mg | ORAL_CAPSULE | Freq: Two times a day (BID) | ORAL | Status: DC
  Administered 2019-09-27 – 2019-10-02 (×11): 300 mg via ORAL
  Filled 2019-09-26 (×11): qty 1

## 2019-09-26 MED ORDER — IPRATROPIUM-ALBUTEROL 20-100 MCG/ACT IN AERS
1.0000 | INHALATION_SPRAY | Freq: Four times a day (QID) | RESPIRATORY_TRACT | Status: DC
  Administered 2019-09-26 – 2019-09-28 (×6): 1 via RESPIRATORY_TRACT
  Filled 2019-09-26: qty 4

## 2019-09-26 MED ORDER — FLUTICASONE FUROATE-VILANTEROL 200-25 MCG/INH IN AEPB
1.0000 | INHALATION_SPRAY | Freq: Every day | RESPIRATORY_TRACT | Status: DC
  Administered 2019-09-27 – 2019-09-30 (×4): 1 via RESPIRATORY_TRACT
  Filled 2019-09-26: qty 28

## 2019-09-26 MED ORDER — GABAPENTIN 300 MG PO CAPS
900.0000 mg | ORAL_CAPSULE | Freq: Every day | ORAL | Status: DC
  Administered 2019-09-27 – 2019-10-01 (×5): 900 mg via ORAL
  Filled 2019-09-26 (×5): qty 3

## 2019-09-26 MED ORDER — ACETAMINOPHEN 325 MG PO TABS
650.0000 mg | ORAL_TABLET | Freq: Four times a day (QID) | ORAL | Status: DC | PRN
Start: 2019-09-26 — End: 2019-10-02
  Administered 2019-09-27 – 2019-10-01 (×2): 650 mg via ORAL
  Filled 2019-09-26 (×2): qty 2

## 2019-09-26 MED ORDER — SENNA 8.6 MG PO TABS
1.0000 | ORAL_TABLET | Freq: Two times a day (BID) | ORAL | Status: DC
  Administered 2019-09-27 – 2019-10-02 (×11): 8.6 mg via ORAL
  Filled 2019-09-26 (×11): qty 1

## 2019-09-26 MED ORDER — CLOPIDOGREL BISULFATE 75 MG PO TABS
75.0000 mg | ORAL_TABLET | Freq: Every day | ORAL | Status: DC
  Administered 2019-09-27 – 2019-10-02 (×6): 75 mg via ORAL
  Filled 2019-09-26 (×6): qty 1

## 2019-09-26 MED ORDER — ATORVASTATIN CALCIUM 10 MG PO TABS
20.0000 mg | ORAL_TABLET | Freq: Every day | ORAL | Status: DC
  Administered 2019-09-27 – 2019-10-01 (×5): 20 mg via ORAL
  Filled 2019-09-26 (×5): qty 2

## 2019-09-26 NOTE — Progress Notes (Signed)
Family updated on extubation today and plan of care for patient.  Attempting swallow eval at this time.   All questions answered.

## 2019-09-26 NOTE — Progress Notes (Signed)
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  In the emergency room she was found to have oxygen saturation of 70% on 2 L of nasal cannula oxygen.  Her blood pressure was stable at presentation.  A CXR in the ED noted bilateral infiltrates worse on the left.  Arrangements were made for the patient to be transferred from [facility] back to the [place] progressive care unit.  While awaiting transfer to [facility] her oxygen requirements increased from nasal cannula to HFNC, and she was ultimately no longer able to be managed with high flow nasal cannula alone.  She was intubated while still in the ED.  Significant Events: 10/24 - 10/29 admit to [facility] - d/c home  11/5 readmit to [facility] via [facility] ED - intubated in [facility] ED  COVID-19 specific Treatment: Solu-Medrol 10/24 >11/2 Remdesivir 10/24 > 10/28 Solu-Medrol 11/5 > 11/9  Subjective: Patient is calm and resting comfortably in bed on pressure support ventilation.  She is interactive and alert.  Assessment & Plan:  Acute on chronic hypoxic and hypercarbic respiratory failure Exact etiology unclear -questionable pneumonia versus COPD exacerbation - PE ruled out - vent management per PCCM - will plan to stop abx after 7 days of tx - possible trial of extubation 11/10 per PCCM   Markedly elevated d-Dimer CT angio of the chest w/o evidence of PE - venous duplex limited but w/o evidence of DVT - changed to prophylactic dose lovenox   Chronic diastolic CHF Physical exam not consistent with acute CHF exacerbation - follow I's and O's  and weights - keep patient on the dry side as blood pressure tolerates - patient is net -1800 cc since admission and net -2.4 L over last 24 hours - not significantly overloaded on exam today   Hypokalemia Due to diuretic use - corrected with supplementation - magnesium is normal  COPD Management per Pulmonary - continue steroids for 5 days of tx (ends today) and nebulizers  OSA On ventilator at present  HTN Blood pressure stable/controlled  DM2 -uncontrolled CBG well controlled at this time - now off steroids   GERD Cont H2 blocker   HLD Continue statin  Hx of CVA On plavix and statin long term   Obesity - Estimated body mass index is 47.69 kg/m as calculated from the following:   Height as of an earlier encounter on 09/21/19: 5' 5"  (1.651 m).   Weight as of an earlier encounter on 09/21/19: 130 kg.  Incidental R thyroid mass (2.3 x 1.5cm) Noted during prior hospital stay -will need follow-up once discharged again for thyroid US - TSH is low - free T4 pending  DVT prophylaxis: intermediate dose lovenox  Code Status: FULL CODE Family Communication: per PCCM  Disposition Plan: ICU  Consultants:  PCCM  Antimicrobials:  Maxipime 11/5 >  Objective: Blood pressure 112/65, pulse 79, temperature 97.9 F (36.6 C), temperature source Oral, resp. rate (!) 30, height 5' 5"  (1.651 m), weight 124.2 kg, SpO2 98 %.  Intake/Output Summary (Last 24 hours) at 09/26/2019 1359 Last data filed at 09/26/2019 0000 Gross per  24 hour  Intake 595 ml  Output 2175 ml  Net -1580 ml   Filed Weights   09/24/19 0500 09/25/19 0442 09/26/19 0500  Weight: 130.7 kg 128.8 kg 124.2 kg    Examination: General: Alert and calm Lungs: Good air movement throughout - no wheezing  Cardiovascular: RRR  Abdomen: Soft, morbidly obese, no mass Extremities: No edema B LE   CBC: Recent Labs  Lab 09/22/19 0550  09/23/19 0519 09/24/19 0416 09/25/19 0330 09/25/19 0530  WBC 7.8  --  9.6   --   --  10.3  NEUTROABS 6.1  --  7.9*  --   --  8.0*  HGB 12.2   < > 12.5 13.3 12.9 12.4  HCT 43.3   < > 42.7 39.0 38.0 42.7  MCV 97.3  --  96.0  --   --  96.8  PLT 195  --  185  --   --  189   < > = values in this interval not displayed.   Basic Metabolic Panel: Recent Labs  Lab 09/21/19 1755 09/22/19 0550  09/23/19 0519 09/24/19 0416 09/25/19 0330 09/25/19 0530  NA  --  139   < > 137 140 140 144  K  --  3.7   < > 4.0 3.4* 4.6 4.8  CL  --  93*  --  95*  --   --  104  CO2  --  32  --  28  --   --  30  GLUCOSE  --  190*  --  201*  --   --  122*  BUN  --  18  --  23  --   --  31*  CREATININE  --  0.81  --  0.72  --   --  0.69  CALCIUM  --  8.6*  --  8.4*  --   --  8.9  MG 1.8 1.8  --  2.0  --   --  2.3  PHOS 2.2* 2.5  --  2.5  --   --   --    < > = values in this interval not displayed.   GFR: Estimated Creatinine Clearance: 87.9 mL/min (by C-G formula based on SCr of 0.69 mg/dL).  Liver Function Tests: Recent Labs  Lab 09/22/19 0550 09/23/19 0519 09/25/19 0530  AST 20 16 29   ALT 13 12 22   ALKPHOS 44 45 48  BILITOT 0.4 0.2* 0.4  PROT 6.6 6.4* 6.5  ALBUMIN 2.7* 2.5* 2.6*    HbA1C: Hemoglobin A1C  Date/Time Value Ref Range Status  02/23/2018 02:50 PM 5.6  Final   Hgb A1c MFr Bld  Date/Time Value Ref Range Status  09/10/2019 02:09 AM 6.6 (H) 4.8 - 5.6 % Final    Comment:    (NOTE) Pre diabetes:          5.7%-6.4% Diabetes:              >6.4% Glycemic control for   <7.0% adults with diabetes   10/23/2010 11:37 AM 5.9 4.6 - 6.5 % Final    Comment:    See lab report for associated comment(s)    CBG: Recent Labs  Lab 09/25/19 2008 09/26/19 0001 09/26/19 0402 09/26/19 1018 09/26/19 1317  GLUCAP 166* 137* 145* 160* 160*    Recent Results (from the past 240 hour(s))  MRSA PCR Screening     Status: None   Collection Time: 09/21/19  6:00 PM   Specimen: Nasopharyngeal  Result Value Ref  Range Status   MRSA by PCR NEGATIVE NEGATIVE Final    Comment:         The GeneXpert MRSA Assay (FDA approved for NASAL specimens only), is one component of a comprehensive MRSA colonization surveillance program. It is not intended to diagnose MRSA infection nor to guide or monitor treatment for MRSA infections. Performed at [facility], [place] [address] [place], [place] [address]      Scheduled Meds: . arformoterol  15 mcg Nebulization BID  . atorvastatin  20 mg Per Tube q1800  . budesonide (PULMICORT) nebulizer solution  0.5 mg Nebulization BID  . chlorhexidine gluconate (MEDLINE KIT)  15 mL Mouth Rinse BID  . Chlorhexidine Gluconate Cloth  6 each Topical Daily  . clopidogrel  75 mg Per Tube Daily  . cycloSPORINE  1 drop Both Eyes BID  . enoxaparin (LOVENOX) injection  65 mg Subcutaneous Q12H  . famotidine  20 mg Per Tube BID  . feeding supplement (PRO-STAT SUGAR FREE 64)  30 mL Per Tube BID  . gabapentin  300 mg Per Tube BID   And  . gabapentin  900 mg Per Tube QHS  . insulin aspart  0-20 Units Subcutaneous Q4H  . ipratropium-albuterol  3 mL Nebulization TID  . mouth rinse  15 mL Mouth Rinse 10 times per day  . polyethylene glycol  17 g Oral BID  . QUEtiapine  25 mg Oral BID  . senna  1 tablet Per Tube BID     LOS: 5 days   Cherene Altes, MD [facility] Office  [phone] Pager - Text Page per Amion  If 7PM-7AM, please contact night-coverage per Amion 09/26/2019, 1:59 PM

## 2019-09-26 NOTE — Procedures (Signed)
Extubation Procedure Note  Patient Details:   Name: Karen Perez DOB: 03-01-1951 MRN: [id number]   Airway Documentation:    Vent end date: 09/26/19 Vent end time: 1150   Evaluation  O2 sats: stable throughout Complications: No apparent complications Patient did tolerate procedure well. Bilateral Breath Sounds: Diminished   Yes  Ned Grace 09/26/2019, 11:56 AM

## 2019-09-26 NOTE — Progress Notes (Signed)
NAME:  Karen PolingLinda J Perez, MRN:  [id number], DOB:  03/10/1951, LOS: 5 ADMISSION DATE:  09/21/2019, CONSULTATION DATE:  11/5 REFERRING MD:  [name], CHIEF COMPLAINT:  Dyspnea   Brief History   68 y/o female recently discharged from this hospital after treatment for COVID 19 pneumonia was re-admitted on 11/5 with hypercarbic respiratory failure requiring intubation and mechanical ventilation.  Past Medical History  Stroke Hypertension Diabetes mellitus COPD CHF Asthma Arthritis  Significant Hospital Events   October 29 [facility] after being treated for Covid pneumonia with Decadron and remdesivir November 5 admission >   Consults:  Pulmonary and critical care medicine  Procedures:  11/5 ETT >   Significant Diagnostic Tests:  11/6 CT angiogram chest > no PE, patchy bilateral airspace disease, worse compared to prior 11/8 chest x-ray-mild bilateral interstitial pattern consistent with CHF Micro Data:  10/24 SARS COV 2 > positive 11/5 resp culture >  No results  Antimicrobials:  10/24 remdesivir > 10/28 11/5 ceftriaxone >   11/5 azithro >   Interim history/subjective:  Wakes up and follows commands off sedation.  Tolerating initiation of SBT  Objective   Blood pressure 108/61, pulse 72, temperature 99.5 F (37.5 C), temperature source Axillary, resp. rate (!) 26, height 5\' 5"  (1.651 m), weight 124.2 kg, SpO2 93 %.    Vent Mode: PRVC FiO2 (%):  [40 %-60 %] 50 % Set Rate:  [26 bmp] 26 bmp Vt Set:  [450 mL] 450 mL PEEP:  [5 cmH20] 5 cmH20 Plateau Pressure:  [18 cmH20-23 cmH20] 20 cmH20   Intake/Output Summary (Last 24 hours) at 09/26/2019 1021 Last data filed at 09/26/2019 0000 Gross per 24 hour  Intake 730 ml  Output 3375 ml  Net -2645 ml   Filed Weights   09/24/19 0500 09/25/19 0442 09/26/19 0500  Weight: 130.7 kg 128.8 kg 124.2 kg    Examination:  General: Obese HENT:ETT/OGT in place with no skin breakdown. PULM: chest clear with  no wheezing. Maintaining saturation  With PEEP at 5. Minimal secretions. CV: RRR, no mgr, no edema GI: BS+, soft, nontender MSK: normal bulk and tone Neuro: awake and cooperative. Follows basic commands and able to tolerate sitting position.   Resolved Hospital Problem list     Assessment & Plan:  Acute hypercarbic and hypoxemic respiratory failure: progression of infiltrates from COVID on imaging, post COVID inflammatory changes? clinically seems like COPD exacerbation Poor baseline pulmonary function (followed by Dr. Craige CottaSood in the office) sleep apnea with noncompliance and chronic hypoxemia. Suspect that she is optimized and close to her baseline. -Daily SBT- likely trial of extubation today -Complete 5-day course of Solu-Medrol  -Trial of diuresis -At high risk for requiring tracheostomy (currently ventilator day 3)  COPD, acute exacerbation COVID markers low. -Solumedrol -duoneb scheduled -Brovana/pulmicort  OSA -Consider Heated high flow qHS after extubation  Pneumonia, at risk for pseudomonas given recent hospitalization, steroids, SNF: -Cefepime per pharmacy to complete 7 days -F/U resp culture, no results so far, so may not have been sent.  We will continue empiric coverage  Need for sedation for mechanical ventilation - Sedation interruption today - Attempt to transition to intermittent sedation.   Best practice:  Diet: start tube feeding Pain/Anxiety/Delirium protocol (if indicated): intermittent fentanyl for tube tolerance  VAP protocol (if indicated): yes DVT prophylaxis: lovenox GI prophylaxis: famotidine Glucose control: SSI Mobility: bed rest Code Status: full Family Communication: Dr. Denese KillingsAgarwala updated her daughter [name] on 11/08.  She endorsed a history of  increasing functional decline.  Some of her noncompliance and impulsiveness may be secondary to prior cerebral aneurysm surgery and subarachnoid hemorrhage.  Dr. Lynetta Mare informed her that while he is  hopeful that she will be extubated in the next few days there is a possibility that her current condition is a manifestation of a further decline in her overall respiratory status and that tracheostomy as a bridge to recovery may be a possibility.  He also warmed of the possibility that she may require a long-term mechanical ventilation further diminishing her quality of life and independence. Disposition: remain in ICU  Labs   CBC: Recent Labs  Lab 09/21/19 0601  09/22/19 0550  09/23/19 0358 09/23/19 0519 09/24/19 0416 09/25/19 0330 09/25/19 0530  WBC 7.4  --  7.8  --   --  9.6  --   --  10.3  NEUTROABS 5.9  --  6.1  --   --  7.9*  --   --  8.0*  HGB 12.5   < > 12.2   < > 13.9 12.5 13.3 12.9 12.4  HCT 45.1   < > 43.3   < > 41.0 42.7 39.0 38.0 42.7  MCV 99.8  --  97.3  --   --  96.0  --   --  96.8  PLT 182  --  195  --   --  185  --   --  189   < > = values in this interval not displayed.    Basic Metabolic Panel: Recent Labs  Lab 09/21/19 0601  09/21/19 1755 09/22/19 0550  09/23/19 0358 09/23/19 0519 09/24/19 0416 09/25/19 0330 09/25/19 0530  NA 142   < >  --  139   < > 137 137 140 140 144  K 4.7   < >  --  3.7   < > 3.9 4.0 3.4* 4.6 4.8  CL 96*  --   --  93*  --   --  95*  --   --  104  CO2 35*  --   --  32  --   --  28  --   --  30  GLUCOSE 116*  --   --  190*  --   --  201*  --   --  122*  BUN 16  --   --  18  --   --  23  --   --  31*  CREATININE 0.72  --   --  0.81  --   --  0.72  --   --  0.69  CALCIUM 8.9  --   --  8.6*  --   --  8.4*  --   --  8.9  MG  --   --  1.8 1.8  --   --  2.0  --   --  2.3  PHOS  --   --  2.2* 2.5  --   --  2.5  --   --   --    < > = values in this interval not displayed.   GFR: Estimated Creatinine Clearance: 87.9 mL/min (by C-G formula based on SCr of 0.69 mg/dL). Recent Labs  Lab 09/21/19 0601 09/21/19 0819 09/22/19 0550 09/23/19 0519 09/25/19 0530  PROCALCITON  --  <0.10  --   --   --   WBC 7.4  --  7.8 9.6 10.3    Liver  Function Tests: Recent Labs  Lab 09/22/19 0550 09/23/19 0519 09/25/19 0530  AST 20 16 29   ALT 13 12 22   ALKPHOS 44 45 48  BILITOT 0.4 0.2* 0.4  PROT 6.6 6.4* 6.5  ALBUMIN 2.7* 2.5* 2.6*   No results for input(s): LIPASE, AMYLASE in the last 168 hours. No results for input(s): AMMONIA in the last 168 hours.  ABG    Component Value Date/Time   PHART 7.443 09/25/2019 0330   PCO2ART 49.8 (H) 09/25/2019 0330   PO2ART 67.0 (L) 09/25/2019 0330   HCO3 34.1 (H) 09/25/2019 0330   TCO2 36 (H) 09/25/2019 0330   O2SAT 93.0 09/25/2019 0330     Coagulation Profile: No results for input(s): INR, PROTIME in the last 168 hours.  Cardiac Enzymes: No results for input(s): CKTOTAL, CKMB, CKMBINDEX, TROPONINI in the last 168 hours.  HbA1C: Hemoglobin A1C  Date/Time Value Ref Range Status  02/23/2018 02:50 PM 5.6  Final   Hgb A1c MFr Bld  Date/Time Value Ref Range Status  09/10/2019 02:09 AM 6.6 (H) 4.8 - 5.6 % Final    Comment:    (NOTE) Pre diabetes:          5.7%-6.4% Diabetes:              >6.4% Glycemic control for   <7.0% adults with diabetes   10/23/2010 11:37 AM 5.9 4.6 - 6.5 % Final    Comment:    See lab report for associated comment(s)    CBG: Recent Labs  Lab 09/25/19 1150 09/25/19 1545 09/25/19 2008 09/26/19 0001 09/26/19 0402  GLUCAP 213* 228* 166* 137* 145*      CRITICAL CARE Performed by: 13/10/20   Total critical care time: 35 minutes  Critical care time was exclusive of separately billable procedures and treating other patients.  Critical care was necessary to treat or prevent imminent or life-threatening deterioration.  Critical care was time spent personally by me on the following activities: development of treatment plan with patient and/or surrogate as well as nursing, discussions with consultants, evaluation of patient's response to treatment, examination of patient, obtaining history from patient or surrogate, ordering and performing  treatments and interventions, ordering and review of laboratory studies, ordering and review of radiographic studies, pulse oximetry, re-evaluation of patient's condition and participation in multidisciplinary rounds.  13/10/20, MD [facility] ICU Physician [facility] Critical Care  Pager: [phone] Mobile: [phone] After hours: [phone].

## 2019-09-26 NOTE — Evaluation (Signed)
Clinical/Bedside Swallow Evaluation Patient Details  Name: Karen Perez MRN: [id number] Date of Birth: [birth year]  Today's Date: 09/26/2019 Time: SLP Start Time (ACUTE ONLY): 1520 SLP Stop Time (ACUTE ONLY): 1528 SLP Time Calculation (min) (ACUTE ONLY): 8 min  Past Medical History:  Past Medical History:  Diagnosis Date  . Arthritis   . Asthma   . Brain aneurysm   . CHF (congestive heart failure) ([place])   . COPD (chronic obstructive pulmonary disease) ([place])   . Diabetes mellitus without complication ([place])   . Hypertension   . Stroke [facility]) 2003   Past Surgical History:  Past Surgical History:  Procedure Laterality Date  . CEREBRAL ANEURYSM REPAIR  2013   HPI:  68 y/o female recently discharged from this hospital after treatment for COVID 19 pneumonia was re-admitted on 11/5 with hypercarbic respiratory failure requiring intubation and mechanical ventilation. Intubated from 11/5-11/10. No prior SLP notes.    Assessment / Plan / Recommendation Clinical Impression  Observed and assisted RN administer the Yale swallow protocol; Pt was alert and following commands initially, consumed 3 oz of water consecutively as instructed but had immediate coughing. SLP then attempted modified textures to attempt safe administration of meds etc over the next 12-24 hours. Pt however orally held puree bolus despite max tactile and verbal cueing. Pt still responsive but did not initiate a swallow and puree suctioned from her mouth. Discussed with RN, who can attempt the Yale a second time this evening or over the night shift if mentation improves. SLP will f/u tomorrow for further trials and safety regardless.  SLP Visit Diagnosis: Dysphagia, oropharyngeal phase (R13.12)    Aspiration Risk  Moderate aspiration risk    Diet Recommendation NPO        Other  Recommendations     Follow up Recommendations Skilled Nursing facility      Frequency and Duration min 2x/week  2 weeks       Prognosis  Prognosis for Safe Diet Advancement: Good      Swallow Study   General HPI: 68 y/o female recently discharged from this hospital after treatment for COVID 19 pneumonia was re-admitted on 11/5 with hypercarbic respiratory failure requiring intubation and mechanical ventilation. Intubated from 11/5-11/10. No prior SLP notes.  Type of Study: Bedside Swallow Evaluation Previous Swallow Assessment: none Diet Prior to this Study: NPO Temperature Spikes Noted: No Respiratory Status: Nasal cannula History of Recent Intubation: Yes Length of Intubations (days): 6 days Date extubated: 09/26/19 Behavior/Cognition: Alert;Lethargic/Drowsy Oral Cavity Assessment: Within Functional Limits Oral Care Completed by SLP: Recent completion by staff Oral Cavity - Dentition: Adequate natural dentition Self-Feeding Abilities: Total assist Patient Positioning: Upright in bed Baseline Vocal Quality: Low vocal intensity Volitional Cough: Congested Volitional Swallow: Unable to elicit    Oral/Motor/Sensory Function Overall Oral Motor/Sensory Function: Within functional limits   Ice Chips Ice chips: Not tested   Thin Liquid Thin Liquid: Impaired Presentation: Straw Pharyngeal  Phase Impairments: Cough - Immediate    Nectar Thick Nectar Thick Liquid: Not tested   Honey Thick Honey Thick Liquid: Not tested   Puree Puree: Impaired Presentation: Spoon Oral Phase Functional Implications: Oral holding   Solid     Solid: Not tested     Herbie Baltimore, MA CCC-SLP  Acute Rehabilitation Services Pager [phone] Office [phone]  Lynann Beaver 09/26/2019,4:03 PM

## 2019-09-27 LAB — POCT I-STAT 7, (LYTES, BLD GAS, ICA,H+H)
Acid-Base Excess: 10 mmol/L — ABNORMAL HIGH (ref 0.0–2.0)
Bicarbonate: 35.9 mmol/L — ABNORMAL HIGH (ref 20.0–28.0)
Calcium, Ion: 1.23 mmol/L (ref 1.15–1.40)
HCT: 41 % (ref 36.0–46.0)
Hemoglobin: 13.9 g/dL (ref 12.0–15.0)
O2 Saturation: 88 %
Patient temperature: 98.1
Potassium: 3.7 mmol/L (ref 3.5–5.1)
Sodium: 146 mmol/L — ABNORMAL HIGH (ref 135–145)
TCO2: 38 mmol/L — ABNORMAL HIGH (ref 22–32)
pCO2 arterial: 53.8 mmHg — ABNORMAL HIGH (ref 32.0–48.0)
pH, Arterial: 7.431 (ref 7.350–7.450)
pO2, Arterial: 54 mmHg — ABNORMAL LOW (ref 83.0–108.0)

## 2019-09-27 LAB — GLUCOSE, CAPILLARY
Glucose-Capillary: 109 mg/dL — ABNORMAL HIGH (ref 70–99)
Glucose-Capillary: 89 mg/dL (ref 70–99)
Glucose-Capillary: 111 mg/dL — ABNORMAL HIGH (ref 70–99)
Glucose-Capillary: 126 mg/dL — ABNORMAL HIGH (ref 70–99)
Glucose-Capillary: 206 mg/dL — ABNORMAL HIGH (ref 70–99)
Glucose-Capillary: 233 mg/dL — ABNORMAL HIGH (ref 70–99)

## 2019-09-27 MED ORDER — TRAZODONE HCL 50 MG PO TABS
25.0000 mg | ORAL_TABLET | Freq: Every day | ORAL | Status: DC
  Administered 2019-09-28 – 2019-10-01 (×4): 25 mg via ORAL
  Filled 2019-09-27 (×4): qty 1

## 2019-09-27 MED ORDER — POLYETHYLENE GLYCOL 3350 17 G PO PACK: 17.0000 g | PACK | Freq: Every day | ORAL | Status: DC | PRN

## 2019-09-27 MED ORDER — MELATONIN 5 MG PO TABS
5.0000 mg | ORAL_TABLET | Freq: Every day | ORAL | Status: DC
  Administered 2019-09-28 – 2019-10-01 (×4): 5 mg via ORAL
  Filled 2019-09-27 (×5): qty 1

## 2019-09-27 MED ORDER — MIRABEGRON ER 25 MG PO TB24
25.0000 mg | ORAL_TABLET | Freq: Every day | ORAL | Status: DC
Start: 2019-09-28 — End: 2019-10-02
  Administered 2019-09-28 – 2019-10-02 (×5): 25 mg via ORAL
  Filled 2019-09-27 (×6): qty 1

## 2019-09-27 MED ORDER — CITALOPRAM HYDROBROMIDE 10 MG PO TABS
20.0000 mg | ORAL_TABLET | Freq: Every day | ORAL | Status: DC
  Administered 2019-09-27 – 2019-10-02 (×6): 20 mg via ORAL
  Filled 2019-09-27 (×6): qty 2

## 2019-09-27 MED ORDER — ORAL CARE MOUTH RINSE
15.0000 mL | Freq: Two times a day (BID) | OROMUCOSAL | Status: DC
Start: 2019-09-27 — End: 2019-09-27
  Administered 2019-09-27 (×2): 15 mL via OROMUCOSAL

## 2019-09-27 MED ORDER — FUROSEMIDE 20 MG PO TABS
40.0000 mg | ORAL_TABLET | Freq: Two times a day (BID) | ORAL | Status: DC
  Administered 2019-09-28 – 2019-10-01 (×8): 40 mg via ORAL
  Filled 2019-09-27 (×9): qty 2

## 2019-09-27 NOTE — Progress Notes (Signed)
  Speech Language Pathology Treatment: Dysphagia  Patient Details Name: Karen Perez MRN: [id number] DOB: [birth year] Today's Date: 09/27/2019 Time: 1320-1340 SLP Time Calculation (min) (ACUTE ONLY): 20 min  Assessment / Plan / Recommendation Clinical Impression  RN appropriately repeated Yale swallow screen this am and pt passed, started on regular diet and thin liquids. Given oral holding with solids yesterday, SLP f/u for any need for adjustments to diet. Today however mentation is excellent, pt upright in chair, able to sustain arousal, set up meal and self feed. She is happy to finally eat. No difficulty with mastication. No signs of aspiration. Pt to continue regular texture and thin liquids. SLP will sign off.   HPI HPI: 68 y/o female recently discharged from this hospital after treatment for COVID 19 pneumonia was re-admitted on 11/5 with hypercarbic respiratory failure requiring intubation and mechanical ventilation. Intubated from 11/5-11/10. No prior SLP notes.       SLP Plan  All goals met       Recommendations  Diet recommendations: Regular;Thin liquid Liquids provided via: Cup;Straw Medication Administration: Whole meds with liquid Supervision: Patient able to self feed                Follow up Recommendations: Inpatient Rehab SLP Visit Diagnosis: Dysphagia, unspecified (R13.10) Plan: All goals met       GO               Herbie Baltimore, MA CCC-SLP  Acute Rehabilitation Services Pager [phone] Office [phone]  Lynann Beaver 09/27/2019, 1:40 PM

## 2019-09-27 NOTE — Progress Notes (Signed)
Pharmacy Antibiotic Note  Karen Perez is a 69 y.o. female recently discharged after treatment for COVID 19 pneumonia, now re-admitted on 09/21/2019 with respiratory failure requiring intubation. Currently on D#6/7 of IV cefepime. WBC wnl, Afebrile  Plan: Continue Cefepime 2g IV q8h Follow up renal function & cultures, clinical progress, LOT   Height: 5\' 5"  (165.1 cm) Weight: 270 lb 8.1 oz (122.7 kg) IBW/kg (Calculated) : 57  Temp (24hrs), Avg:97.9 F (36.6 C), Min:97.8 F (36.6 C), Max:98.1 F (36.7 C)  Recent Labs  Lab 09/21/19 0601 09/22/19 0550 09/23/19 0519 09/25/19 0530  WBC 7.4 7.8 9.6 10.3  CREATININE 0.72 0.81 0.72 0.69    Estimated Creatinine Clearance: 87.3 mL/min (by C-G formula based on SCr of 0.69 mg/dL).    Allergies  Allergen Reactions  . Amoxicillin   . Amoxicillin-Pot Clavulanate Diarrhea    Has patient had a PCN reaction causing immediate rash, facial/tongue/throat swelling, SOB or lightheadedness with hypotension: No Has patient had a PCN reaction causing severe rash involving mucus membranes or skin necrosis: No Has patient had a PCN reaction that required hospitalization: No Has patient had a PCN reaction occurring within the last 10 years: No If all of the above answers are "NO", then may proceed with Cephalosporin use.   . Bacitracin   . Neosporin [Neomycin-Bacitracin Zn-Polymyx] Hives    Antimicrobials this admission: 11/5 Rocephin/Zithro x 1 11/5 Cefepime >>  Dose adjustments this admission:  Microbiology results: 11/5 MRSA PCR: negative  Thank you for allowing pharmacy to be a part of this patient's care.  Albertina Parr, PharmD., BCPS Clinical Pharmacist Clinical phone for 09/27/19 until 5pm: [phone]

## 2019-09-27 NOTE — Progress Notes (Signed)
Pt transferred to [facility] . Pt did not have any belongings

## 2019-09-27 NOTE — Progress Notes (Signed)
pts daughter tina updated and aware of transfer.

## 2019-09-27 NOTE — Progress Notes (Signed)
NAME:  Karen Perez, MRN:  [id number], DOB:  July 31, 1951, LOS: 6 ADMISSION DATE:  09/21/2019, CONSULTATION DATE:  11/5 REFERRING MD:  Donna Christen, CHIEF COMPLAINT:  Dyspnea   Brief History   68 y/o female recently discharged from this hospital after treatment for COVID 19 pneumonia was re-admitted on 11/5 with hypercarbic respiratory failure requiring intubation and mechanical ventilation.  Past Medical History  Stroke Hypertension Diabetes mellitus COPD CHF Asthma Arthritis  Significant Hospital Events   October 29 discharged from [facility] after being treated for Covid pneumonia with Decadron and remdesivir November 5 admission >   Consults:  Pulmonary and critical care medicine  Procedures:  11/5 ETT >   Significant Diagnostic Tests:  11/6 CT angiogram chest > no PE, patchy bilateral airspace disease, worse compared to prior 11/8 chest x-ray-mild bilateral interstitial pattern consistent with CHF Micro Data:  10/24 SARS COV 2 > positive 11/5 resp culture >  No results  Antimicrobials:  10/24 remdesivir > 10/28 11/5 ceftriaxone >   11/5 azithro >   Interim history/subjective:  Extubated yesterday. She denies dyspnea. Appetite has returned. Passed bedside swallow evaluation this morning.  Objective   Blood pressure 113/81, pulse 78, temperature 97.9 F (36.6 C), temperature source Oral, resp. rate (!) 29, height 5\' 5"  (1.651 m), weight 122.7 kg, SpO2 95 %.        Intake/Output Summary (Last 24 hours) at 09/27/2019 1050 Last data filed at 09/27/2019 1000 Gross per 24 hour  Intake 585 ml  Output 500 ml  Net 85 ml   Filed Weights   09/25/19 0442 09/26/19 0500 09/27/19 0500  Weight: 128.8 kg 124.2 kg 122.7 kg    Examination:  General: Obese HENT: No skin breakdown. PULM: No accessory muscle use, + transmitted upper airway sounds. CV: RRR, no mgr, no edema GI: BS+, soft, nontender MSK: normal bulk and tone Neuro: awake and cooperative. Speaking  in full sentences.   Resolved Hospital Problem list     Assessment & Plan:   Was critically ill due to acute hypercarbic and hypoxemic respiratory failure: progression of infiltrates from COVID on imaging, post COVID inflammatory changes? clinically seems like COPD exacerbation Poor baseline pulmonary function (followed by Dr. Halford Chessman in the office) sleep apnea with noncompliance and chronic hypoxemia. Suspect that she is optimized and close to her baseline. -transfer to floor. -tolerate O2 saturations to 88%  COPD, acute exacerbation COVID markers low. -Solumedrol completed -duoneb scheduled -Brovana/pulmicort  OSA -Consider Heated high flow qHS after extubation  Pneumonia, at risk for pseudomonas given recent hospitalization, steroids, SNF: -Cefepime per pharmacy to complete 7 days -F/U resp culture, no results so far, so may not have been sent.  We will continue empiric coverage  Need for sedation for mechanical ventilation - Sedation interruption today - Attempt to transition to intermittent sedation.   Best practice:  Diet: oral diet Pain/Anxiety/Delirium protocol (if indicated): none VAP protocol (if indicated): not applicable DVT prophylaxis: lovenox GI prophylaxis: famotidine Glucose control: SSI Mobility: up to chair (PT consult) poor mobility at baseline. Code Status: full Family Communication: Dr. Lynetta Mare updated her daughter [name] on 11/10.   Disposition: transfer to floor - order reconciliation completed.  Labs   CBC: Recent Labs  Lab 09/21/19 0601  09/22/19 0550  09/23/19 0519 09/24/19 0416 09/25/19 0330 09/25/19 0530 09/27/19 0505  WBC 7.4  --  7.8  --  9.6  --   --  10.3  --   NEUTROABS 5.9  --  6.1  --  7.9*  --   --  8.0*  --   HGB 12.5   < > 12.2   < > 12.5 13.3 12.9 12.4 13.9  HCT 45.1   < > 43.3   < > 42.7 39.0 38.0 42.7 41.0  MCV 99.8  --  97.3  --  96.0  --   --  96.8  --   PLT 182  --  195  --  185  --   --  189  --    < > = values in  this interval not displayed.    Basic Metabolic Panel: Recent Labs  Lab 09/21/19 0601  09/21/19 1755 09/22/19 0550  09/23/19 0519 09/24/19 0416 09/25/19 0330 09/25/19 0530 09/27/19 0505  NA 142   < >  --  139   < > 137 140 140 144 146*  K 4.7   < >  --  3.7   < > 4.0 3.4* 4.6 4.8 3.7  CL 96*  --   --  93*  --  95*  --   --  104  --   CO2 35*  --   --  32  --  28  --   --  30  --   GLUCOSE 116*  --   --  190*  --  201*  --   --  122*  --   BUN 16  --   --  18  --  23  --   --  31*  --   CREATININE 0.72  --   --  0.81  --  0.72  --   --  0.69  --   CALCIUM 8.9  --   --  8.6*  --  8.4*  --   --  8.9  --   MG  --   --  1.8 1.8  --  2.0  --   --  2.3  --   PHOS  --   --  2.2* 2.5  --  2.5  --   --   --   --    < > = values in this interval not displayed.   GFR: Estimated Creatinine Clearance: 87.3 mL/min (by C-G formula based on SCr of 0.69 mg/dL). Recent Labs  Lab 09/21/19 0601 09/21/19 0819 09/22/19 0550 09/23/19 0519 09/25/19 0530  PROCALCITON  --  <0.10  --   --   --   WBC 7.4  --  7.8 9.6 10.3    Liver Function Tests: Recent Labs  Lab 09/22/19 0550 09/23/19 0519 09/25/19 0530  AST 20 16 29   ALT 13 12 22   ALKPHOS 44 45 48  BILITOT 0.4 0.2* 0.4  PROT 6.6 6.4* 6.5  ALBUMIN 2.7* 2.5* 2.6*   No results for input(s): LIPASE, AMYLASE in the last 168 hours. No results for input(s): AMMONIA in the last 168 hours.  ABG    Component Value Date/Time   PHART 7.431 09/27/2019 0505   PCO2ART 53.8 (H) 09/27/2019 0505   PO2ART 54.0 (L) 09/27/2019 0505   HCO3 35.9 (H) 09/27/2019 0505   TCO2 38 (H) 09/27/2019 0505   O2SAT 88.0 09/27/2019 0505     Coagulation Profile: No results for input(s): INR, PROTIME in the last 168 hours.  Cardiac Enzymes: No results for input(s): CKTOTAL, CKMB, CKMBINDEX, TROPONINI in the last 168 hours.  HbA1C: Hemoglobin A1C  Date/Time Value Ref Range Status  02/23/2018 02:50 PM 5.6  Final   Hgb A1c MFr Bld  Date/Time Value Ref Range  Status  09/10/2019 02:09 AM 6.6 (H) 4.8 - 5.6 % Final    Comment:    (NOTE) Pre diabetes:          5.7%-6.4% Diabetes:              >6.4% Glycemic control for   <7.0% adults with diabetes   10/23/2010 11:37 AM 5.9 4.6 - 6.5 % Final    Comment:    See lab report for associated comment(s)    CBG: Recent Labs  Lab 09/26/19 1317 09/26/19 1932 09/26/19 2340 09/27/19 0343 09/27/19 0739  GLUCAP 160* 112* 95 89 111*     35 min spent with >50% in counseling and coordination of care.  Lynnell Catalanavi Leylany Nored, MD [facility] ICU Physician [facility]  Pager: [phone] Mobile: [phone] After hours: [phone].

## 2019-09-28 DIAGNOSIS — E079 Disorder of thyroid, unspecified: Secondary | ICD-10-CM

## 2019-09-28 DIAGNOSIS — E1165 Type 2 diabetes mellitus with hyperglycemia: Secondary | ICD-10-CM

## 2019-09-28 DIAGNOSIS — I5032 Chronic diastolic (congestive) heart failure: Secondary | ICD-10-CM

## 2019-09-28 LAB — CBC WITH DIFFERENTIAL/PLATELET
Abs Immature Granulocytes: 0.04 10*3/uL (ref 0.00–0.07)
Basophils Absolute: 0 10*3/uL (ref 0.0–0.1)
Basophils Relative: 0 %
Eosinophils Absolute: 0.1 10*3/uL (ref 0.0–0.5)
Eosinophils Relative: 2 %
HCT: 45.3 % (ref 36.0–46.0)
Hemoglobin: 12.8 g/dL (ref 12.0–15.0)
Immature Granulocytes: 1 %
Lymphocytes Relative: 18 %
Lymphs Abs: 1 10*3/uL (ref 0.7–4.0)
MCH: 27.9 pg (ref 26.0–34.0)
MCHC: 28.3 g/dL — ABNORMAL LOW (ref 30.0–36.0)
MCV: 98.9 fL (ref 80.0–100.0)
Monocytes Absolute: 0.7 10*3/uL (ref 0.1–1.0)
Monocytes Relative: 12 %
Neutro Abs: 3.6 10*3/uL (ref 1.7–7.7)
Neutrophils Relative %: 67 %
Platelets: 229 10*3/uL (ref 150–400)
RBC: 4.58 MIL/uL (ref 3.87–5.11)
RDW: 16.2 % — ABNORMAL HIGH (ref 11.5–15.5)
WBC: 5.4 10*3/uL (ref 4.0–10.5)
nRBC: 0 % (ref 0.0–0.2)

## 2019-09-28 LAB — COMPREHENSIVE METABOLIC PANEL
ALT: 25 U/L (ref 0–44)
AST: 20 U/L (ref 15–41)
Albumin: 3 g/dL — ABNORMAL LOW (ref 3.5–5.0)
Alkaline Phosphatase: 46 U/L (ref 38–126)
Anion gap: 8 (ref 5–15)
BUN: 33 mg/dL — ABNORMAL HIGH (ref 8–23)
CO2: 31 mmol/L (ref 22–32)
Calcium: 9 mg/dL (ref 8.9–10.3)
Chloride: 105 mmol/L (ref 98–111)
Creatinine, Ser: 0.81 mg/dL (ref 0.44–1.00)
GFR calc Af Amer: >60 mL/min (ref >60)
GFR calc non Af Amer: >60 mL/min (ref >60)
Glucose, Bld: 133 mg/dL — ABNORMAL HIGH (ref 70–99)
Potassium: 4.1 mmol/L (ref 3.5–5.1)
Sodium: 144 mmol/L (ref 135–145)
Total Bilirubin: 1 mg/dL (ref 0.3–1.2)
Total Protein: 6.9 g/dL (ref 6.5–8.1)

## 2019-09-28 LAB — GLUCOSE, CAPILLARY
Glucose-Capillary: 118 mg/dL — ABNORMAL HIGH (ref 70–99)
Glucose-Capillary: 124 mg/dL — ABNORMAL HIGH (ref 70–99)
Glucose-Capillary: 132 mg/dL — ABNORMAL HIGH (ref 70–99)
Glucose-Capillary: 138 mg/dL — ABNORMAL HIGH (ref 70–99)
Glucose-Capillary: 154 mg/dL — ABNORMAL HIGH (ref 70–99)
Glucose-Capillary: 190 mg/dL — ABNORMAL HIGH (ref 70–99)

## 2019-09-28 MED ORDER — IPRATROPIUM-ALBUTEROL 20-100 MCG/ACT IN AERS
2.0000 | INHALATION_SPRAY | Freq: Four times a day (QID) | RESPIRATORY_TRACT | Status: DC
  Administered 2019-09-28 – 2019-10-02 (×13): 2 via RESPIRATORY_TRACT
  Filled 2019-09-28: qty 4

## 2019-09-28 MED ORDER — ENOXAPARIN SODIUM 80 MG/0.8ML ~~LOC~~ SOLN
65.0000 mg | SUBCUTANEOUS | Status: DC
  Administered 2019-09-29 – 2019-10-02 (×4): 65 mg via SUBCUTANEOUS
  Filled 2019-09-28 (×4): qty 0.8

## 2019-09-28 MED ORDER — ALBUTEROL SULFATE HFA 108 (90 BASE) MCG/ACT IN AERS
4.0000 | INHALATION_SPRAY | RESPIRATORY_TRACT | Status: DC | PRN
  Administered 2019-09-28: 4 via RESPIRATORY_TRACT
  Filled 2019-09-28: qty 6.7

## 2019-09-28 MED ORDER — ENSURE MAX PROTEIN PO LIQD
11.0000 [oz_av] | Freq: Two times a day (BID) | ORAL | Status: DC
  Administered 2019-09-28 – 2019-10-01 (×8): 11 [oz_av] via ORAL
  Filled 2019-09-28 (×11): qty 330

## 2019-09-28 NOTE — Progress Notes (Signed)
Nutrition Follow-up  DOCUMENTATION CODES:   Morbid obesity  INTERVENTION:    Add Ensure Max po BID, each supplement provides 150 kcal and 30 grams of protein.   NUTRITION DIAGNOSIS:   Increased nutrient needs related to (COVID) as evidenced by estimated needs.  Ongoing   GOAL:   Patient will meet greater than or equal to 90% of their needs  Progressing   MONITOR:   PO intake, Supplement acceptance, Labs  ASSESSMENT:   68 yo female with recent admission for COVID 19 pneumonia and re-admitted with acute respiratory failure requiring intubation. PMH includes stroke, HTN, DM, COPD, CHF  Extubated 11/10. Diet has been advanced to CHO modified and patient is consuming ~75% of meals. Currently requiring 10-12 L HFNC. Labs reviewed. CBG's: [id number] Medications reviewed. Weight down 7.2 kg since admission, likely at least partially volume related.    Diet Order:   Diet Order            Diet Carb Modified Fluid consistency: Thin; Room service appropriate? Yes  Diet effective now              EDUCATION NEEDS:   Not appropriate for education at this time  Skin:  Skin Assessment: Reviewed RN Assessment  Last BM:  11/6  Height:   Ht Readings from Last 1 Encounters:  09/23/19 5\' 5"  (1.651 m)    Weight:   Wt Readings from Last 1 Encounters:  09/27/19 122.7 kg    Ideal Body Weight:  56.8 kg  BMI:  Body mass index is 45.01 kg/m.  Estimated Nutritional Needs:   Kcal:  1950-2150  Protein:  110-125 gm  Fluid:  >/= 2 L    Molli Barrows, RD, LDN, [place] Pager [phone] After Hours Pager [phone]

## 2019-09-28 NOTE — Evaluation (Signed)
Physical Therapy Evaluation Patient Details Name: Karen Perez MRN: [id number] DOB: November 15, 1951 Today's Date: 09/28/2019   History of Present Illness  Pt is a 68 y.o. female SNF resident recently d/c from [place] (09/14/19), now readmitted 09/21/19 with hypercarbic respiratory failure requiring intubation. ETT 11/5-11/10. PMH includes stroke, HTN, DM, COPD, CHF, asthma, arthritis.    Clinical Impression  Pt presents with an overall decrease in functional mobility secondary to above. PTA, pt lives at ALF, ambulatory with RW/rollator, requiring assist from staff for ADLs/IADLs. Today, pt required maxA+2 to attempt standing; required use of maximove lift for safe transfer to recliner, tolerated well although SpO2 down to low 80s on 15L O2 HFNC; pt required multiple seated rest breaks with minimal activity. Pt would benefit from continued acute PT services to maximize functional mobility and independence prior to d/c with SNF-level therapies.     Follow Up Recommendations SNF;Supervision/Assistance - 24 hour    Equipment Recommendations  None recommended by PT    Recommendations for Other Services       Precautions / Restrictions Precautions Precautions: Fall Precaution Comments: Desats on 15L HFNC, urine incontinence Restrictions Weight Bearing Restrictions: No      Mobility  Bed Mobility Overal bed mobility: Needs Assistance Bed Mobility: Supine to Sit;Rolling;Sit to Supine Rolling: Min assist Sidelying to sit: HOB elevated;Mod assist;+2 for physical assistance   Sit to supine: Mod assist;+2 for physical assistance   General bed mobility comments: Assist to scoot BLEs to EOB and assist trunk elevation, cues to use bed rail. MinA to roll for maximove pad placement  Transfers Overall transfer level: Needs assistance Equipment used: Rolling walker (2 wheeled) Transfers: Sit to/from Stand Sit to Stand: Max assist;+2 physical assistance         General transfer comment: Unable  to clear buttocks attempting to stand from EOB with max+2; use of maximove to transfer to recliner, tolerated well; stood from recliner with heavy reliance on bilateral arm rests support and maxA+2, able to clear buttocks but unable to achieve fully upright standing  Ambulation/Gait                Stairs            Wheelchair Mobility    Modified Rankin (Stroke Patients Only)       Balance Overall balance assessment: Needs assistance   Sitting balance-Leahy Scale: Fair                                       Pertinent Vitals/Pain Pain Assessment: Faces Faces Pain Scale: Hurts a little bit Pain Location: L knee ("arthritis") Pain Descriptors / Indicators: Constant Pain Intervention(s): Monitored during session;Repositioned    Home Living Family/patient expects to be discharged to:: Assisted living               Home Equipment: Walker - 2 wheels;Walker - 4 wheels;Hospital bed;Wheelchair - Sport and exercise psychologist Comments: reports being from [facility]    Prior Function Level of Independence: Needs assistance   Gait / Transfers Assistance Needed: Reports using RW (rollator?) for short distances  ADL's / Homemaking Assistance Needed: Pt reports indep with bathing, dressing, toileting; sits for showers; per chart, staff assists with bathing and manages IADLs        Hand Dominance        Extremity/Trunk Assessment   Upper Extremity Assessment Upper Extremity Assessment: Generalized weakness    Lower  Extremity Assessment Lower Extremity Assessment: Generalized weakness       Communication   Communication: No difficulties  Cognition Arousal/Alertness: Awake/alert Behavior During Therapy: WFL for tasks assessed/performed;Flat affect Overall Cognitive Status: Within Functional Limits for tasks assessed                                        General Comments General comments (skin integrity, edema, etc.): SpO2  down to 82% on on 14L HFNC, fluctuating between 84-91% on 15L HFNC with frequent seated rest breaks and cues for deep breathing. Pt with productive cough during session, able to clear secretions    Exercises     Assessment/Plan    PT Assessment Patient needs continued PT services  PT Problem List Decreased strength;Decreased mobility;Decreased activity tolerance;Cardiopulmonary status limiting activity;Decreased knowledge of use of DME;Decreased balance       PT Treatment Interventions DME instruction;Therapeutic activities;Gait training;Therapeutic exercise;Functional mobility training;Patient/family education    PT Goals (Current goals can be found in the Care Plan section)  Acute Rehab PT Goals Patient Stated Goal: "I want to do it for myself" PT Goal Formulation: With patient Time For Goal Achievement: 10/12/19 Potential to Achieve Goals: Fair    Frequency Min 2X/week   Barriers to discharge        Co-evaluation PT/OT/SLP Co-Evaluation/Treatment: Yes Reason for Co-Treatment: For patient/therapist safety;To address functional/ADL transfers PT goals addressed during session: Mobility/safety with mobility         AM-PAC PT "6 Clicks" Mobility  Outcome Measure Help needed turning from your back to your side while in a flat bed without using bedrails?: A Lot Help needed moving from lying on your back to sitting on the side of a flat bed without using bedrails?: A Lot Help needed moving to and from a bed to a chair (including a wheelchair)?: Total Help needed standing up from a chair using your arms (e.g., wheelchair or bedside chair)?: Total Help needed to walk in hospital room?: Total Help needed climbing 3-5 steps with a railing? : Total 6 Click Score: 8    End of Session   Activity Tolerance: Patient tolerated treatment well;Patient limited by fatigue Patient left: in chair;with call bell/phone within reach;with chair alarm set Nurse Communication: Mobility  status;Need for lift equipment PT Visit Diagnosis: Other abnormalities of gait and mobility (R26.89);Muscle weakness (generalized) (M62.81)    Time: 9485-4627 PT Time Calculation (min) (ACUTE ONLY): 39 min   Charges:   PT Evaluation $PT Eval Moderate Complexity: 1 Mod PT Treatments $Therapeutic Activity: 8-22 mins   Ina Homes, PT, DPT Acute Rehabilitation Services  Pager [phone] Office [phone]  Malachy Chamber 09/28/2019, 10:24 AM

## 2019-09-28 NOTE — Progress Notes (Addendum)
PROGRESS NOTE  Karen PolingLinda J Perez ZOX:[id number] DOB: 11/22/1950 DOA: 09/21/2019  PCP: Lyndon CodeKhan, Fozia M, MD  Brief [facility] a [facility] 1 L nasal cannula O2 dependent COPD, DM2, HTN, prior CVA, HLD, OSA on CPAP, multiple cerebral aneurysms requiring clipping,andchronicdiastolic CHF who was [facility] on her baseline oxygen support and [facility] ED with progressive worsening shortness of breath. In the emergency room she was found to have oxygen saturation of 70% on 2 L of nasal cannula oxygen. Her blood pressure was stable at presentation. A CXR in the ED noted bilateral infiltrates worse on the left. Arrangements were made for the patient to be [facility].  While waiting for transfer if she decompensated further requiring intubation.  Reason for Visit: Acute respiratory failure with hypoxia and hypercapnia  Consultants: Pulmonology  Procedures: None  Antibiotics: Anti-infectives (From admission, onward)   Start     Dose/Rate Route Frequency Ordered Stop   09/22/19 1600  remdesivir 100 mg in sodium chloride 0.9 % 250 mL IVPB  Status:  Discontinued     100 mg 500 mL/hr over 30 Minutes Intravenous Every 24 hours 09/21/19 1542 09/21/19 1653   09/21/19 1800  ceFEPIme (MAXIPIME) 2 g in sodium chloride 0.9 % 100 mL IVPB     2 g 200 mL/hr over 30 Minutes Intravenous Every 8 hours 09/21/19 1759 09/28/19 1759   09/21/19 1700  remdesivir 200 mg in sodium chloride 0.9 % 250 mL IVPB  Status:  Discontinued     200 mg 500 mL/hr over 30 Minutes Intravenous Once 09/21/19 1542 09/21/19 1653      Subjective/Interval History: Patient states that she is feeling fine.  Seems to be a bit distracted.  Denies any shortness of breath this morning although she is requiring high doses of oxygen.  No cough.  No chest pain.  No nausea vomiting.  She is  eating her breakfast.    Assessment/Plan:  Acute on chronic hypoxic and hypercapnic respiratory failure Etiology thought to be due to combination of COPD exacerbation as well as pneumonia.  Patient recently hospitalized for Covid pneumonia and was treated with remdesivir and steroids.  She was placed back on steroids during this hospitalization.  She has completed course of the same.  She is currently on cefepime for 7 days.  She will complete course of treatment later today.  Patient was also intubated in the ICU and was extubated a few days ago.  Continues to require high amounts of oxygen currently on 12 to 14 L high flow nasal cannula.  Mobilize incentive spirometry.  Increase dose of Combivent and add as needed albuterol for wheezing.   Recent COVID-19 pneumonia Status post treatment.  See above.  Markedly elevated D-dimer CT angiogram of chest without evidence for PE.  Lower extremity Dopplers did not show any DVT.  Continue prophylactic dose of Lovenox.  Improved from 11.79 to 3.54.  Chronic diastolic CHF Continue oral Lasix.  Assess volume status daily.  Ins and outs Daily weights.  History of obstructive sleep apnea Nightly oxygen.  Cannot use CPAP due to hospital policy.  Essential hypertension Monitor blood pressures closely.  Noted to be just on furosemide here in the hospital.  On lisinopril at home which is on hold currently.  Diabetes mellitus type 2, uncontrolled with hyperglycemia SSI to continue.  Elevated CBGs most likely due to steroids.  HbA1c 6.6  on October 25.  Noted to be on Metformin at home.  Hyperlipidemia Continue statin  History of GERD Continue H2 blocker  History of stroke Continue Plavix.  PT and OT evaluation.  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  This was also noted on CT scan from 2019.  Although the size seems to have increased some.  She will need thyroid ultrasound at follow-up.  TSH was 0.289  free T4 1.38 which is noted to be mildly elevated.  Patient currently without any significant symptoms suggestive of hypothyroidism.  Will recommend these levels be rechecked in the outpatient setting.  Morbid Obesity Estimated body mass index is 45.01 kg/m as calculated from the following:   Height as of this encounter: 5\' 5"  (1.651 m).   Weight as of this encounter: 122.7 kg.  DVT Prophylaxis: Lovenox 65 mg every 12 hours PUD Prophylaxis: Famotidine Code Status: Full code Family Communication: Discussed with patient.  Will call family later today Disposition Plan: Management as outlined above. mobilize.   Medications:  Scheduled: . atorvastatin  20 mg Oral q1800  . Chlorhexidine Gluconate Cloth  6 each Topical Daily  . citalopram  20 mg Oral Daily  . clopidogrel  75 mg Oral Daily  . cycloSPORINE  1 drop Both Eyes BID  . enoxaparin (LOVENOX) injection  65 mg Subcutaneous Q12H  . famotidine  20 mg Oral BID  . fluticasone furoate-vilanterol  1 puff Inhalation Daily  . furosemide  40 mg Oral BID  . gabapentin  300 mg Oral BID WC  . gabapentin  900 mg Oral QHS  . insulin aspart  0-20 Units Subcutaneous Q4H  . Ipratropium-Albuterol  2 puff Inhalation Q6H  . Melatonin  5 mg Oral QHS  . mirabegron ER  25 mg Oral Daily  . senna  1 tablet Oral BID  . traZODone  25 mg Oral Q2000   Continuous: . ceFEPime (MAXIPIME) IV 2 g (09/28/19 0149)   13/12/20, albuterol, ondansetron **OR** ondansetron (ZOFRAN) IV, polyethylene glycol   Objective:  Vital Signs  Vitals:   09/27/19 1926 09/28/19 0000 09/28/19 0421 09/28/19 0802  BP: 113/60 (!) 98/54 104/68 (!) 110/47  Pulse: 79 84 85 69  Resp: (!) 25 (!) 22 20 20   Temp: 97.9 F (36.6 C) 98 F (36.7 C) 97.8 F (36.6 C) 98.2 F (36.8 C)  TempSrc: Oral Oral Axillary Oral  SpO2: 95% 94% 92% 94%  Weight:      Height:        Intake/Output Summary (Last 24 hours) at 09/28/2019 0937 Last data filed at 09/27/2019 1926 Gross  per 24 hour  Intake 1200 ml  Output 400 ml  Net 800 ml   Filed Weights   09/25/19 0442 09/26/19 0500 09/27/19 0500  Weight: 128.8 kg 124.2 kg 122.7 kg    General appearance: Awake alert.  In no distress.  Morbidly obese Resp: Mildly tachypneic at rest.  Coarse breath sounds bilaterally with crackles at the bases.  Scattered wheezing appreciated. Cardio: S1-S2 is normal regular.  No S3-S4.  No rubs murmurs or bruit GI: Abdomen is soft.  Nontender nondistended.  Bowel sounds are present normal.  No masses organomegaly Extremities: Minimal edema bilateral lower extremities Neurologic: Alert and oriented x3.  No focal neurological deficits.    Lab Results:  Data Reviewed: I have personally reviewed following labs and imaging studies  CBC: Recent Labs  Lab 09/22/19 0550  09/23/19 0519 09/24/19 0416 09/25/19 0330 09/25/19 0530  09/27/19 0505 09/28/19 0550  WBC 7.8  --  9.6  --   --  10.3  --  5.4  NEUTROABS 6.1  --  7.9*  --   --  8.0*  --  3.6  HGB 12.2   < > 12.5 13.3 12.9 12.4 13.9 12.8  HCT 43.3   < > 42.7 39.0 38.0 42.7 41.0 45.3  MCV 97.3  --  96.0  --   --  96.8  --  98.9  PLT 195  --  185  --   --  189  --  229   < > = values in this interval not displayed.    Basic Metabolic Panel: Recent Labs  Lab 09/21/19 1755 09/22/19 0550  09/23/19 0519 09/24/19 0416 09/25/19 0330 09/25/19 0530 09/27/19 0505 09/28/19 0550  NA  --  139   < > 137 140 140 144 146* 144  K  --  3.7   < > 4.0 3.4* 4.6 4.8 3.7 4.1  CL  --  93*  --  95*  --   --  104  --  105  CO2  --  32  --  28  --   --  30  --  31  GLUCOSE  --  190*  --  201*  --   --  122*  --  133*  BUN  --  18  --  23  --   --  31*  --  33*  CREATININE  --  0.81  --  0.72  --   --  0.69  --  0.81  CALCIUM  --  8.6*  --  8.4*  --   --  8.9  --  9.0  MG 1.8 1.8  --  2.0  --   --  2.3  --   --   PHOS 2.2* 2.5  --  2.5  --   --   --   --   --    < > = values in this interval not displayed.    GFR: Estimated Creatinine  Clearance: 86.2 mL/min (by C-G formula based on SCr of 0.81 mg/dL).  Liver Function Tests: Recent Labs  Lab 09/22/19 0550 09/23/19 0519 09/25/19 0530 09/28/19 0550  AST 20 16 29 20   ALT 13 12 22 25   ALKPHOS 44 45 48 46  BILITOT 0.4 0.2* 0.4 1.0  PROT 6.6 6.4* 6.5 6.9  ALBUMIN 2.7* 2.5* 2.6* 3.0*     CBG: Recent Labs  Lab 09/27/19 1404 09/27/19 1728 09/27/19 1916 09/28/19 0012 09/28/19 0418  GLUCAP 126* 206* 233* 118* 124*      Recent Results (from the past 240 hour(s))  MRSA PCR Screening     Status: None   Collection Time: 09/21/19  6:00 PM   Specimen: Nasopharyngeal  Result Value Ref Range Status   MRSA by PCR NEGATIVE NEGATIVE Final    Comment:        The GeneXpert MRSA Assay (FDA approved for NASAL specimens only), is one component of a comprehensive MRSA colonization surveillance program. It is not intended to diagnose MRSA infection nor to guide or monitor treatment for MRSA infections. Performed at [facility], [place] [address], [place], [place] [address]       Radiology Studies: No results found.     LOS: 7 days   Malaina Mortellaro [employer] on [email or web address]  09/28/2019, 9:37 AM

## 2019-09-28 NOTE — Progress Notes (Signed)
Pt is out of the ICU now and her last d-dimer<5, ok to change lovenox to 0.5mg /kg/day per Dr Curly Rim.  Onnie Boer, PharmD, BCIDP, AAHIVP, CPP Infectious Disease Pharmacist 09/28/2019 1:11 PM

## 2019-09-28 NOTE — Progress Notes (Signed)
Occupational Therapy Treatment Patient Details Name: Karen Perez MRN: [id number] DOB: November 19, 1950 Today's Date: 09/28/2019    History of present illness Pt is a 68 y.o. female SNF resident recently d/c from [facility] (09/14/19), now readmitted 09/21/19 with hypercarbic respiratory failure requiring intubation. ETT 11/5-11/10. PMH includes stroke, HTN, DM, COPD, CHF, asthma, arthritis.   OT comments  Pt seen this pm to assist nursing with use of Maximove to mobilize pt to [facility] and then to bed. Pt able to help lift off BSC for pericare. Recommend continued use of IS and flutter valve - pt verbalized understanding.   Follow Up Recommendations  SNF    Equipment Recommendations  None recommended by OT    Recommendations for Other Services      Precautions / Restrictions Precautions Precautions: Fall Precaution Comments: Desats on 15L HFNC, urine incontinence       Mobility Bed Mobility Overal bed mobility: Needs Assistance             General bed mobility comments: able to help pull into long sitting in bed  Transfers Overall transfer level: Needs assistance                    Balance Overall balance assessment: Needs assistance Sitting-balance support: Feet supported Sitting balance-Leahy Scale: Fair                                     ADL either performed or assessed with clinical judgement   ADL                           Toilet Transfer: BSC;+2 for physical assistance(Maximove used) Statistician Details (indicate cue type and reason): Maximove used to trasnfer onto [facility] Toileting- Clothing Manipulation and Hygiene: Total assistance       Functional mobility during ADLs: Total assistance       Vision       Perception     Praxis      Cognition Arousal/Alertness: Awake/alert Behavior During Therapy: WFL for tasks assessed/performed;Flat affect Overall Cognitive Status: Impaired/Different from baseline Area of  Impairment: Awareness                           Awareness: Emergent   General Comments: Pt was lifted to chair due to inability to completely stand in previous session. Pt stating that is I give her a walker she can walk to the bathroom.        Exercises     Shoulder Instructions       General Comments      Pertinent Vitals/ Pain       Pain Assessment: Faces Faces Pain Scale: Hurts a little bit Pain Location: L knee ("arthritis") Pain Descriptors / Indicators: Constant Pain Intervention(s): Limited activity within patient's tolerance  Home Living                                          Prior Functioning/Environment              Frequency  Min 2X/week        Progress Toward Goals  OT Goals(current goals can now be found in the care plan section)  Progress towards OT goals: Progressing toward  goals  Acute Rehab OT Goals Patient Stated Goal: "I want to do it for myself" OT Goal Formulation: With patient Time For Goal Achievement: 10/12/19 Potential to Achieve Goals: Good ADL Goals Pt Will Perform Grooming: with supervision;sitting Pt Will Perform Lower Body Bathing: with mod assist;sit to/from stand;sitting/lateral leans Pt Will Perform Upper Body Dressing: with set-up;with supervision;sitting Pt Will Perform Lower Body Dressing: with mod assist;sitting/lateral leans;sit to/from stand Pt Will Transfer to Toilet: with min assist;stand pivot transfer;bedside commode Pt Will Perform Toileting - Clothing Manipulation and hygiene: with mod assist;sit to/from stand;sitting/lateral leans Pt Will Perform Tub/Shower Transfer: with supervision;shower seat;grab bars;rolling walker;ambulating Pt/caregiver will Perform Home Exercise Program: Increased ROM;Increased strength;Both right and left upper extremity;With written HEP provided Additional ADL Goal #1: Pt will tolerate sitting EOB >5 min at supervision level during ADL completion.  Plan  Discharge plan remains appropriate    Co-evaluation                 AM-PAC OT "6 Clicks" Daily Activity     Outcome Measure   Help from another person eating meals?: None Help from another person taking care of personal grooming?: A Little Help from another person toileting, which includes using toliet, bedpan, or urinal?: Total Help from another person bathing (including washing, rinsing, drying)?: A Lot Help from another person to put on and taking off regular upper body clothing?: A Lot Help from another person to put on and taking off regular lower body clothing?: Total 6 Click Score: 13    End of Session Equipment Utilized During Treatment: Oxygen  OT Visit Diagnosis: Other abnormalities of gait and mobility (R26.89);Muscle weakness (generalized) (M62.81)   Activity Tolerance Patient tolerated treatment well   Patient Left in bed;with call bell/phone within reach;with bed alarm set;with nursing/sitter in room   Nurse Communication Mobility status;Need for lift equipment        Time: 7902-4097 OT Time Calculation (min): 30 min  Charges: OT General Charges $OT Visit: 1 Visit OT Treatments $Self Care/Home Management : 23-37 mins  Maurie Boettcher, OT/L   Acute OT Clinical Specialist [place] Pager [phone] Office [phone]    [facility] 09/28/2019, 5:32 PM

## 2019-09-28 NOTE — Evaluation (Signed)
Occupational Therapy Evaluation Patient Details Name: Karen Perez MRN: [id number] DOB: 06-26-1951 Today's Date: 09/28/2019    History of Present Illness Pt is a 68 y.o. female SNF resident recently d/c from [place] (09/14/19), now readmitted 09/21/19 with hypercarbic respiratory failure requiring intubation. ETT 11/5-11/10. PMH includes stroke, HTN, DM, COPD, CHF, asthma, arthritis.   Clinical Impression   This 68 y/o female presents with the above. PTA pt residing in SNF, reports ambulatory short distances via RW and reports was completing ADL without assist. Of note pt also with recent admit and d/c from [facility]. Pt now presenting with significant weakness, decreased endurance and respiratory status with activity, all impacting her functional status. Use of maximove for OOB to recliner during this session and pt tolerating well. Attempted sit<>stand both from EOB and recliner and pt only able to achieve partial stand with +2 assist. Pt currently requires up to [place] for UB ADL, totalA for LB ADL. Pt on 14-15L HFNC during session with lowest SpO2 noted 82%, increases to >90% with seated rest and cues for deep breathing techniques (rebounds within approx 1-2 min). Pt will benefit from continued acute OT services and recommend follow up therapy services in SNF setting after discharge to progress pt towards her PLOF. Will follow.      Follow Up Recommendations  SNF    Equipment Recommendations  Other (comment)(TBD in next venue)           Precautions / Restrictions Precautions Precautions: Fall Precaution Comments: Desats on 15L HFNC, urine incontinence Restrictions Weight Bearing Restrictions: No      Mobility Bed Mobility Overal bed mobility: Needs Assistance Bed Mobility: Supine to Sit;Rolling;Sit to Supine Rolling: Min assist Sidelying to sit: HOB elevated;Mod assist;+2 for physical assistance   Sit to supine: Mod assist;+2 for physical assistance   General bed mobility comments:  Assist to scoot BLEs to EOB and assist trunk elevation, cues to use bed rail. MinA to roll for maximove pad placement  Transfers Overall transfer level: Needs assistance Equipment used: Rolling walker (2 wheeled) Transfers: Sit to/from Stand Sit to Stand: Max assist;+2 physical assistance         General transfer comment: Unable to clear buttocks attempting to stand from EOB with max+2; use of maximove to transfer to recliner, tolerated well; stood from recliner with heavy reliance on bilateral arm rests support and maxA+2, able to clear buttocks but unable to achieve fully upright standing    Balance Overall balance assessment: Needs assistance Sitting-balance support: Single extremity supported;Bilateral upper extremity supported;Feet supported Sitting balance-Leahy Scale: (fair to poor) Sitting balance - Comments: only able to maintain sitting balance for very short time; mostly reliant on UE support/external assist due to weakness                                   ADL either performed or assessed with clinical judgement   ADL Overall ADL's : Needs assistance/impaired Eating/Feeding: Set up;Sitting   Grooming: Set up;Min guard;Wash/dry face;Sitting Grooming Details (indicate cue type and reason): supported sitting Upper Body Bathing: Minimal assistance;Sitting   Lower Body Bathing: Total assistance;+2 for safety/equipment;+2 for physical assistance;Bed level   Upper Body Dressing : Moderate assistance;Sitting   Lower Body Dressing: Total assistance;+2 for physical assistance;+2 for safety/equipment;Bed level       Toileting- Clothing Manipulation and Hygiene: Total assistance;+2 for physical assistance;+2 for safety/equipment         General ADL  Comments: attempted functional transfers during session (sit<>stand) both from EOB and recliner, pt only able to achieve partial stand with +2 assist; pt with significant weakness/deconditioning, though very  motivated     Vision         Perception     Praxis      Pertinent Vitals/Pain Pain Assessment: Faces Faces Pain Scale: Hurts a little bit Pain Location: L knee ("arthritis") Pain Descriptors / Indicators: Constant Pain Intervention(s): Monitored during session;Repositioned     Hand Dominance     Extremity/Trunk Assessment Upper Extremity Assessment Upper Extremity Assessment: Generalized weakness   Lower Extremity Assessment Lower Extremity Assessment: Generalized weakness;Defer to PT evaluation       Communication Communication Communication: No difficulties   Cognition Arousal/Alertness: Awake/alert Behavior During Therapy: WFL for tasks assessed/performed;Flat affect Overall Cognitive Status: Within Functional Limits for tasks assessed                                     General Comments  SpO2 down to 82% on on 14L HFNC, fluctuating between 84-91% on 15L HFNC with frequent seated rest breaks and cues for deep breathing. Pt with productive cough during session, able to clear secretions    Exercises Exercises: Other exercises Other Exercises Other Exercises: use of IS end of session, pt only able to pull approx 250-500 ml   Shoulder Instructions      Home Living Family/patient expects to be discharged to:: Assisted living                             Home Equipment: Walker - 2 wheels;Walker - 4 wheels;Hospital bed;Wheelchair - Sport and exercise psychologist Comments: reports being from [facility]      Prior Functioning/Environment Level of Independence: Needs assistance  Gait / Transfers Assistance Needed: Reports using RW (rollator?) for short distances ADL's / Homemaking Assistance Needed: Pt reports indep with bathing, dressing, toileting; sits for showers (mostly sponge bathes); per chart, staff assists with bathing and manages IADLs            OT Problem List: Decreased strength;Decreased range of motion;Decreased  activity tolerance;Impaired balance (sitting and/or standing);Decreased safety awareness;Decreased knowledge of use of DME or AE;Obesity;Cardiopulmonary status limiting activity;Pain      OT Treatment/Interventions: Self-care/ADL training;Therapeutic exercise;Energy conservation;DME and/or AE instruction;Therapeutic activities;Patient/family education;Balance training    OT Goals(Current goals can be found in the care plan section) Acute Rehab OT Goals Patient Stated Goal: "I want to do it for myself" OT Goal Formulation: With patient Time For Goal Achievement: 10/12/19 Potential to Achieve Goals: Good  OT Frequency: Min 2X/week   Barriers to D/C:            Co-evaluation PT/OT/SLP Co-Evaluation/Treatment: Yes Reason for Co-Treatment: For patient/therapist safety;To address functional/ADL transfers   OT goals addressed during session: ADL's and self-care      AM-PAC OT "6 Clicks" Daily Activity     Outcome Measure Help from another person eating meals?: A Little Help from another person taking care of personal grooming?: A Little Help from another person toileting, which includes using toliet, bedpan, or urinal?: Total Help from another person bathing (including washing, rinsing, drying)?: A Lot Help from another person to put on and taking off regular upper body clothing?: A Lot Help from another person to put on and taking off regular lower body clothing?: Total 6  Click Score: 12   End of Session Equipment Utilized During Treatment: Rolling walker;Oxygen Nurse Communication: Mobility status;Need for lift equipment  Activity Tolerance: Patient tolerated treatment well;Patient limited by fatigue Patient left: in chair;with call bell/phone within reach  OT Visit Diagnosis: Other abnormalities of gait and mobility (R26.89);Muscle weakness (generalized) (M62.81)                Time: [id number] OT Time Calculation (min): 46 min Charges:  OT General Charges $OT Visit: 1  Visit OT Evaluation $OT Eval Moderate Complexity: 1 Mod  Marcy SirenBreanna Lawson Isabell, OT [facility] [facility] Pager [phone] Office [phone]  Orlando PennerBreanna L Admire Bunnell 09/28/2019, 1:42 PM

## 2019-09-29 ENCOUNTER — Inpatient Hospital Stay (HOSPITAL_COMMUNITY): Payer: Medicare Other

## 2019-09-29 DIAGNOSIS — I10 Essential (primary) hypertension: Secondary | ICD-10-CM

## 2019-09-29 LAB — GLUCOSE, CAPILLARY
Glucose-Capillary: 125 mg/dL — ABNORMAL HIGH (ref 70–99)
Glucose-Capillary: 138 mg/dL — ABNORMAL HIGH (ref 70–99)
Glucose-Capillary: 140 mg/dL — ABNORMAL HIGH (ref 70–99)
Glucose-Capillary: 156 mg/dL — ABNORMAL HIGH (ref 70–99)
Glucose-Capillary: 161 mg/dL — ABNORMAL HIGH (ref 70–99)
Glucose-Capillary: 173 mg/dL — ABNORMAL HIGH (ref 70–99)

## 2019-09-29 LAB — BASIC METABOLIC PANEL
Anion gap: 10 (ref 5–15)
BUN: 28 mg/dL — ABNORMAL HIGH (ref 8–23)
CO2: 31 mmol/L (ref 22–32)
Calcium: 8.9 mg/dL (ref 8.9–10.3)
Chloride: 102 mmol/L (ref 98–111)
Creatinine, Ser: 0.82 mg/dL (ref 0.44–1.00)
GFR calc Af Amer: >60 mL/min (ref >60)
GFR calc non Af Amer: >60 mL/min (ref >60)
Glucose, Bld: 144 mg/dL — ABNORMAL HIGH (ref 70–99)
Potassium: 3.9 mmol/L (ref 3.5–5.1)
Sodium: 143 mmol/L (ref 135–145)

## 2019-09-29 IMAGING — CT CT ANGIO CHEST
2 of 6 series · 18 of 36 positions shown · IV contrast (ISOVUE)
Comparison: 02/23/2018 CTA chest.

CLINICAL DATA: 67 y/o F; shortness of breath, wheezing, dyspnea on
exertion. PE suspected, high pretest probability.

EXAM:
CT ANGIOGRAPHY CHEST WITH CONTRAST
TECHNIQUE: Multidetector CT imaging of the chest was performed using the
standard protocol during bolus administration of intravenous
contrast. Multiplanar CT image reconstructions and MIPs were
obtained to evaluate the vascular anatomy.
CONTRAST:  100mL V9HK12-VZO IOPAMIDOL (V9HK12-VZO) INJECTION 76%

[Series 5: thins · axial · 0.70mm/px · z∈[-336,-112]mm · 17 of 250 slices shown]
[im 13/250  lung]
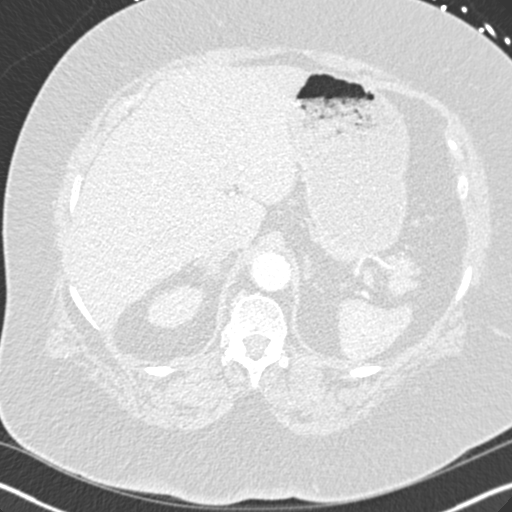
[im 25/250  mediastinal]
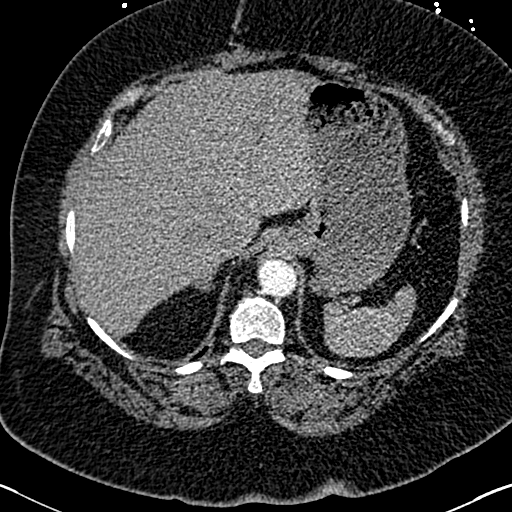
[im 38/250  lung]
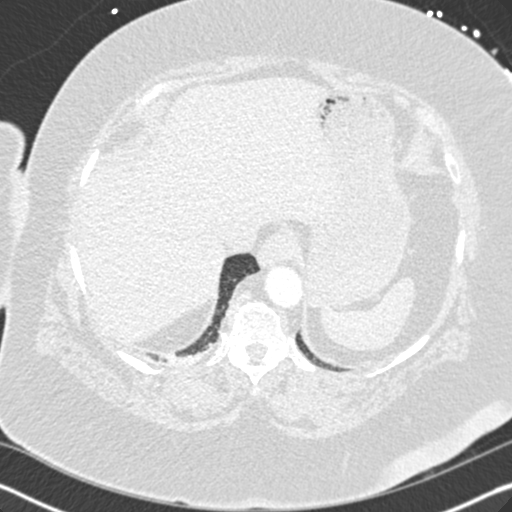
[im 50/250  mediastinal]
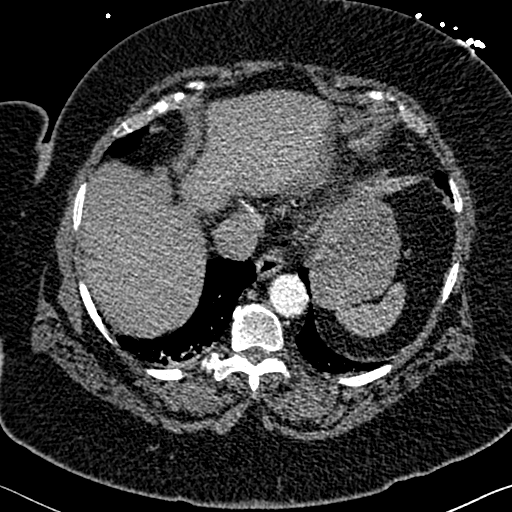
[im 75/250  lung]
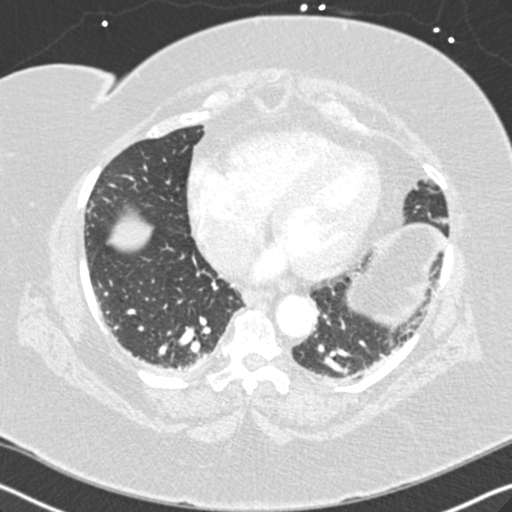
[im 88/250  mediastinal]
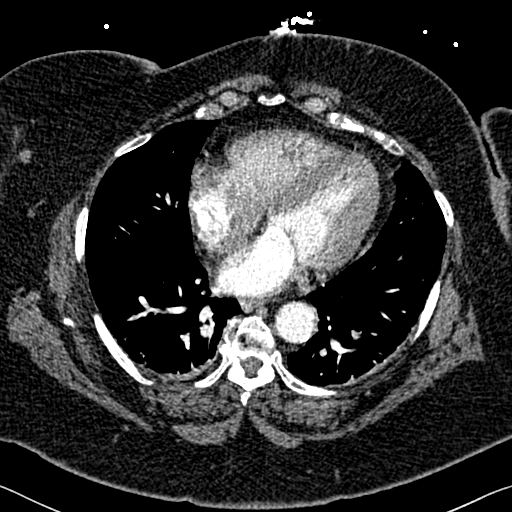
[im 100/250  lung]
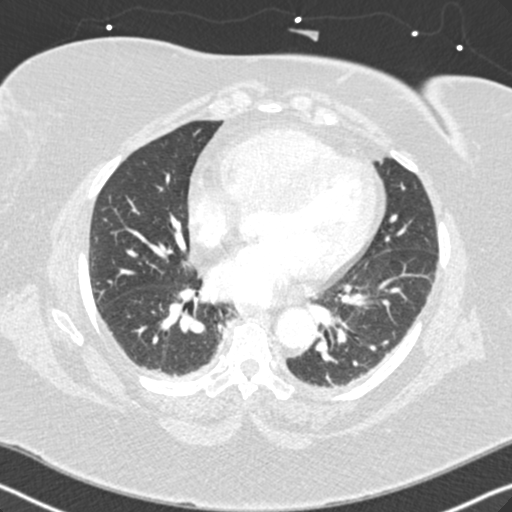
[im 113/250  mediastinal]
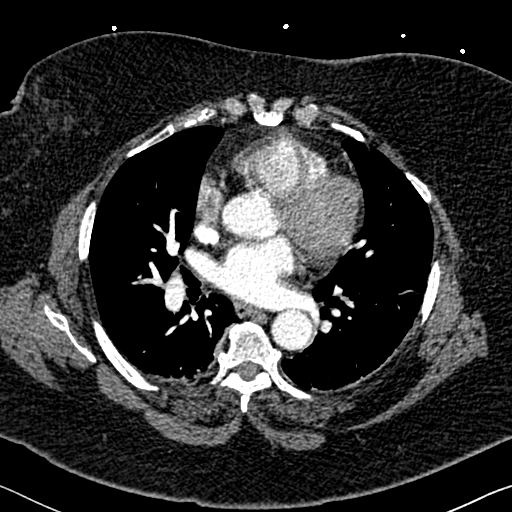
[im 125/250  lung]
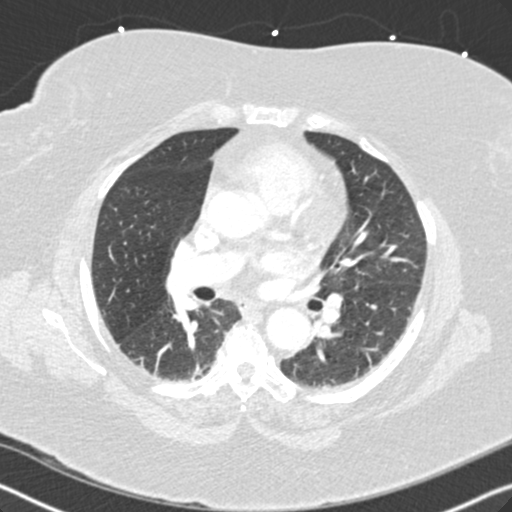
[im 137/250  mediastinal]
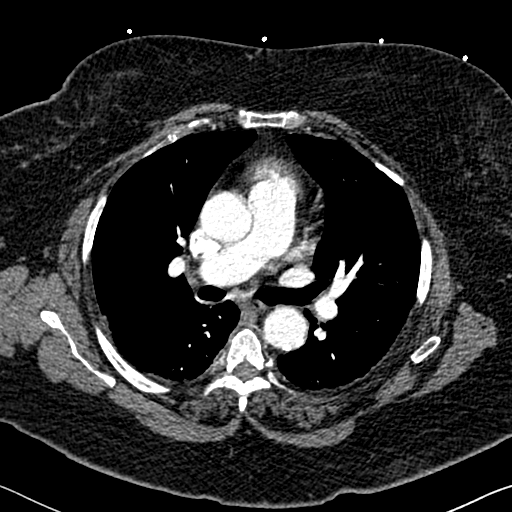
[im 150/250  lung]
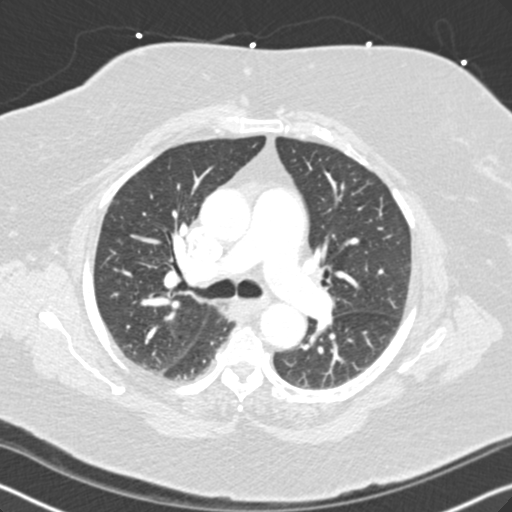
[im 162/250  mediastinal]
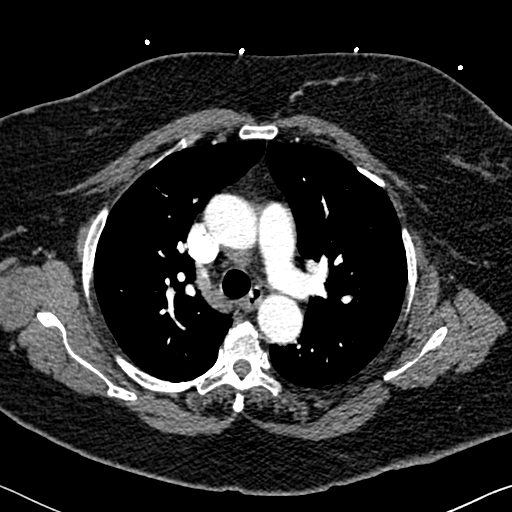
[im 175/250  lung]
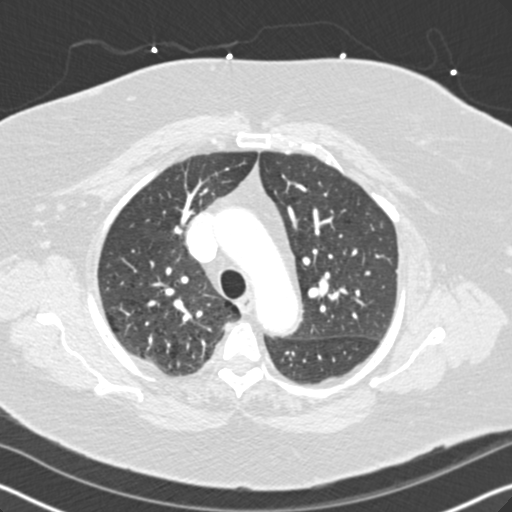
[im 200/250  mediastinal]
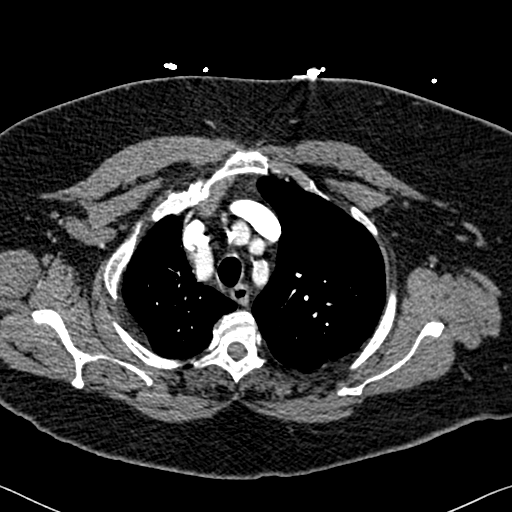
[im 212/250  lung]
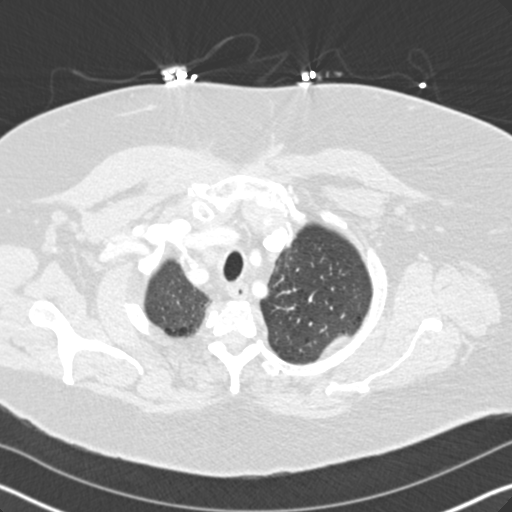
[im 225/250  mediastinal]
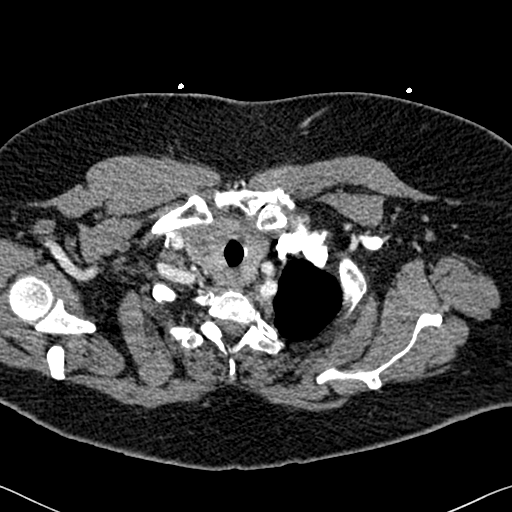
[im 237/250  lung]
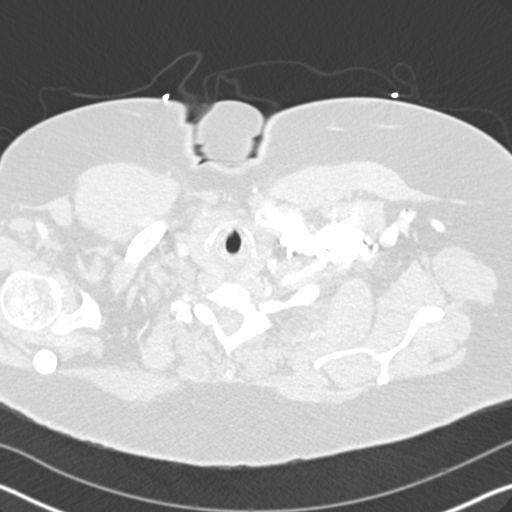

[Series 7: coronal mpr · coronal · 0.50mm/px · 1 of 143 slices shown]
[im 72/143  mediastinal]
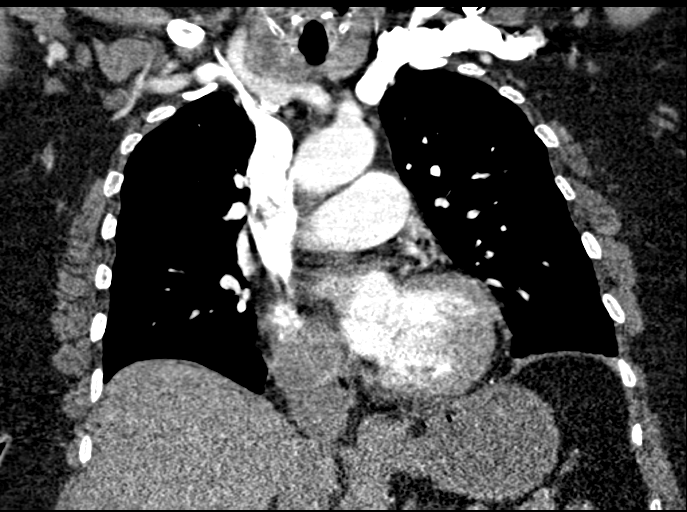

[18 of 36 positions shown; findings below may reference images not displayed]

FINDINGS: Cardiovascular: Stable mild-to-moderate cardiomegaly. No pericardial
effusion. Normal caliber thoracic aorta with mild calcific
atherosclerosis. Satisfactory opacification of pulmonary arteries.
No pulmonary embolus identified. Enlarged main pulmonary artery.

Mediastinum/Nodes: 18 mm stable nodule in right lobe of thyroid.
Normal esophagus. No mediastinal adenopathy.

Lungs/Pleura: Mild centrilobular emphysema greatest in the upper
lobes. Minor atelectasis in the lower lobes bilaterally. No pleural
effusion or pneumothorax.

Upper Abdomen: No acute abnormality.

Musculoskeletal: No chest wall abnormality. No acute or significant
osseous findings. Mild multilevel degenerative changes of the spine.

Review of the MIP images confirms the above findings.
IMPRESSION: 1. No pulmonary embolus identified.
2. Enlarged main pulmonary artery compatible pulmonary artery
hypertension.
3. Mild to moderate cardiomegaly.
4. Calcific atherosclerosis of coronary arteries and aorta.
5. 18 mm nodule in the right lobe of thyroid, thyroid ultrasound is
recommended on a nonemergent basis.
6. Minor atelectasis in the lower lobes.

By: Dacheng Ng M.D.

## 2019-09-29 MED ORDER — GUAIFENESIN-DM 100-10 MG/5ML PO SYRP
5.0000 mL | ORAL_SOLUTION | ORAL | Status: DC | PRN
  Administered 2019-09-29 – 2019-10-01 (×3): 5 mL via ORAL
  Filled 2019-09-29 (×3): qty 5

## 2019-09-29 NOTE — Progress Notes (Signed)
Occupational Therapy Treatment Patient Details Name: Karen Perez MRN: [id number] DOB: December 03, 1950 Today's Date: 09/29/2019    History of present illness Pt is a 68 y.o. female SNF resident recently d/c from [place] (09/14/19), now readmitted 09/21/19 with hypercarbic respiratory failure requiring intubation. ETT 11/5-11/10. PMH includes stroke, HTN, DM, COPD, CHF, asthma, arthritis.   OT comments  Assisted with OOB to recliner during session as RN request assist with transfer/use of maximove. Pt tolerating lift via maximove to recliner well, VSS on supplemental O2 (1L). Pt setup for lunch end of session once positioned in chair. Will continue per POC.   Follow Up Recommendations  SNF    Equipment Recommendations  None recommended by OT          Precautions / Restrictions Precautions Precautions: Fall Precaution Comments: monitor O2, urine incontinence Restrictions Weight Bearing Restrictions: No       Mobility Bed Mobility Overal bed mobility: Needs Assistance Bed Mobility: Rolling Rolling: Max assist         General bed mobility comments: rolling to L/R for placement of maximove pad  Transfers Overall transfer level: Needs assistance               General transfer comment: transfer OOB to recliner via maximove    Balance Overall balance assessment: Needs assistance Sitting-balance support: Feet supported Sitting balance-Leahy Scale: Fair                                     ADL either performed or assessed with clinical judgement   ADL Overall ADL's : Needs assistance/impaired Eating/Feeding: Set up;Sitting Eating/Feeding Details (indicate cue type and reason): setup for lunch tray end of session                                  Functional mobility during ADLs: Total assistance General ADL Comments: RN request assist for OOB to recliner via maximove, assisted OOB for lunch                       Cognition  Arousal/Alertness: Awake/alert Behavior During Therapy: WFL for tasks assessed/performed;Flat affect Overall Cognitive Status: Impaired/Different from baseline Area of Impairment: Awareness                           Awareness: Emergent            Exercises     Shoulder Instructions       General Comments      Pertinent Vitals/ Pain       Pain Assessment: Faces Faces Pain Scale: Hurts a little bit Pain Location: L knee ("arthritis") Pain Descriptors / Indicators: Constant Pain Intervention(s): Limited activity within patient's tolerance;Monitored during session;Repositioned  Home Living                                          Prior Functioning/Environment              Frequency  Min 2X/week        Progress Toward Goals  OT Goals(current goals can now be found in the care plan section)  Progress towards OT goals: Progressing toward goals  Acute Rehab OT Goals Patient Stated  Goal: "I want to do it for myself" OT Goal Formulation: With patient Time For Goal Achievement: 10/12/19 Potential to Achieve Goals: Good ADL Goals Pt Will Perform Grooming: with supervision;sitting Pt Will Perform Lower Body Bathing: with mod assist;sit to/from stand;sitting/lateral leans Pt Will Perform Upper Body Dressing: with set-up;with supervision;sitting Pt Will Perform Lower Body Dressing: with mod assist;sitting/lateral leans;sit to/from stand Pt Will Transfer to Toilet: with min assist;stand pivot transfer;bedside commode Pt Will Perform Toileting - Clothing Manipulation and hygiene: with mod assist;sit to/from stand;sitting/lateral leans Pt Will Perform Tub/Shower Transfer: with supervision;shower seat;grab bars;rolling walker;ambulating Pt/caregiver will Perform Home Exercise Program: Increased ROM;Increased strength;Both right and left upper extremity;With written HEP provided Additional ADL Goal #1: Pt will tolerate sitting EOB >5 min at  supervision level during ADL completion.  Plan Discharge plan remains appropriate    Co-evaluation                 AM-PAC OT "6 Clicks" Daily Activity     Outcome Measure   Help from another person eating meals?: None Help from another person taking care of personal grooming?: A Little Help from another person toileting, which includes using toliet, bedpan, or urinal?: Total Help from another person bathing (including washing, rinsing, drying)?: A Lot Help from another person to put on and taking off regular upper body clothing?: A Lot Help from another person to put on and taking off regular lower body clothing?: Total 6 Click Score: 13    End of Session Equipment Utilized During Treatment: Oxygen  OT Visit Diagnosis: Other abnormalities of gait and mobility (R26.89);Muscle weakness (generalized) (M62.81)   Activity Tolerance Patient tolerated treatment well   Patient Left with call bell/phone within reach;in chair   Nurse Communication Mobility status;Need for lift equipment        Time: 1250-1307 OT Time Calculation (min): 17 min  Charges: OT General Charges $OT Visit: 1 Visit OT Treatments $Self Care/Home Management : 8-22 mins  Karen Perez, OT [facility] Pager [phone] Office [phone]    Karen Perez 09/29/2019, 4:19 PM

## 2019-09-29 NOTE — Progress Notes (Signed)
PROGRESS NOTE  Karen Perez:[id number] DOB: 11-Nov-1951 DOA: 09/21/2019  PCP: Lavera Guise, MD  Brief History/Interval [facility] of 1 L nasal cannula O2 dependent COPD, DM2, HTN, prior CVA, HLD, OSA on CPAP, multiple cerebral aneurysms requiring clipping,andchronicdiastolic CHF who was discharged from [facility] 10/29 on her baseline oxygen support and returned to [facility] ED with progressive worsening shortness of breath. In the emergency room she was found to have oxygen saturation of 70% on 2 L of nasal cannula oxygen. Her blood pressure was stable at presentation. A CXR in the ED noted bilateral infiltrates worse on the left. Arrangements were made for the patient to be transferred from [facility] back to the [facility] progressive care unit.  While waiting for transfer if she decompensated further requiring intubation.  Reason for Visit: Acute respiratory failure with hypoxia and hypercapnia  Consultants: Pulmonology  Procedures: None  Antibiotics: Anti-infectives (From admission, onward)   Start     Dose/Rate Route Frequency Ordered Stop   09/22/19 1600  remdesivir 100 mg in sodium chloride 0.9 % 250 mL IVPB  Status:  Discontinued     100 mg 500 mL/hr over 30 Minutes Intravenous Every 24 hours 09/21/19 1542 09/21/19 1653   09/21/19 1800  ceFEPIme (MAXIPIME) 2 g in sodium chloride 0.9 % 100 mL IVPB     2 g 200 mL/hr over 30 Minutes Intravenous Every 8 hours 09/21/19 1759 09/28/19 1954   09/21/19 1700  remdesivir 200 mg in sodium chloride 0.9 % 250 mL IVPB  Status:  Discontinued     200 mg 500 mL/hr over 30 Minutes Intravenous Once 09/21/19 1542 09/21/19 1653      Subjective/Interval History: Patient noted to be more awake and alert this morning.  States that she still feels sleepy.  Had a disturbed night.  Shortness of breath is improved.  Denies any chest pain.   Assessment/Plan:  Acute on chronic hypoxic  and hypercapnic respiratory failure Patient was intubated in the ICU for a few days. Etiology thought to be due to combination of COPD exacerbation as well as pneumonia.  Patient recently hospitalized for Covid pneumonia and was treated with remdesivir and steroids.  She was placed back on steroids during this hospitalization.  She has completed course of the same.  She was placed on cefepime for 7 days and has completed the treatment of that as well.   She was on high doses of oxygen yesterday.  She has been weaned down significantly.  She is now on 1 to 2 L of oxygen by nasal cannula.  Continue incentive spirometry and mobilization.  Continue inhalers.  Chest x-ray done this morning shows stable findings.  Recent COVID-19 pneumonia Status post treatment.   Markedly elevated D-dimer CT angiogram of chest without evidence for PE.  Lower extremity Dopplers did not show any DVT.  Continue prophylactic dose of Lovenox.  Improved from 11.79 to 3.54.  Chronic diastolic CHF Stable.  Continue with oral Lasix.  Ins and outs.  Daily weights.  History of obstructive sleep apnea Nightly oxygen.  Cannot use CPAP due to hospital policy for Covid patients.  Essential hypertension Noted to be just on furosemide here in the hospital.  On lisinopril at home which is on hold currently.  Blood pressure is reasonably well controlled.  Diabetes mellitus type 2, uncontrolled with hyperglycemia SSI to continue.  HbA1c 6.6 on October 25.  Noted to be on Metformin at home.  Hyperlipidemia Continue statin  History of GERD Continue H2 blocker  History of stroke Continue Plavix.  PT and OT evaluation.  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  This was also noted on CT scan from 2019.  Although the size seems to have increased some.  She will need thyroid ultrasound at follow-up.  TSH was 0.289 free T4 1.38 which is noted to be mildly elevated.  Patient currently without  any significant symptoms suggestive of hypothyroidism.  Will recommend these levels be rechecked in the outpatient setting.  Morbid Obesity Estimated body mass index is 45.01 kg/m as calculated from the following:   Height as of this encounter: 5\' 5"  (1.651 m).   Weight as of this encounter: 122.7 kg.  DVT Prophylaxis: Lovenox 65 mg every 12 hours PUD Prophylaxis: Famotidine Code Status: Full code Family Communication: Discussed with the patient.  Daughter was updated yesterday. Disposition Plan: Will need to go to skilled nursing facility for rehab.  Social worker consulted.   Medications:  Scheduled: . atorvastatin  20 mg Oral q1800  . Chlorhexidine Gluconate Cloth  6 each Topical Daily  . citalopram  20 mg Oral Daily  . clopidogrel  75 mg Oral Daily  . cycloSPORINE  1 drop Both Eyes BID  . enoxaparin (LOVENOX) injection  65 mg Subcutaneous Q24H  . famotidine  20 mg Oral BID  . fluticasone furoate-vilanterol  1 puff Inhalation Daily  . furosemide  40 mg Oral BID  . gabapentin  300 mg Oral BID WC  . gabapentin  900 mg Oral QHS  . insulin aspart  0-20 Units Subcutaneous Q4H  . Ipratropium-Albuterol  2 puff Inhalation Q6H  . Melatonin  5 mg Oral QHS  . mirabegron ER  25 mg Oral Daily  . Ensure Max Protein  11 oz Oral BID  . senna  1 tablet Oral BID  . traZODone  25 mg Oral Q2000   Continuous:  , albuterol, ondansetron **OR** ondansetron (ZOFRAN) IV, polyethylene glycol   Objective:  Vital Signs  Vitals:   09/28/19 1914 09/29/19 0008 09/29/19 0327 09/29/19 0436  BP: 117/72  (!) 113/57   Pulse: 74  74   Resp: 16  18   Temp: 97.6 F (36.4 C)  97.7 F (36.5 C)   TempSrc: Oral  Oral   SpO2: 92% 91% (!) 88% 90%  Weight:      Height:        Intake/Output Summary (Last 24 hours) at 09/29/2019 1015 Last data filed at 09/28/2019 1914 Gross per 24 hour  Intake 840 ml  Output -  Net 840 ml   Filed Weights   09/25/19 0442 09/26/19 0500 09/27/19  0500  Weight: 128.8 kg 124.2 kg 122.7 kg    General appearance: Awake alert.  In no distress.  Morbidly obese Resp: Improved aeration.  Less tachypneic.  Continues to have scattered wheezing.  Few crackles bilaterally.   Cardio: S1-S2 is normal regular.  No S3-S4.  No rubs murmurs or bruit GI: Abdomen is soft.  Nontender nondistended.  Bowel sounds are present normal.  No masses organomegaly Extremities: Minimal edema.  Able to move her extremities Neurologic: No focal neurological deficits.    Lab Results:  Data Reviewed: I have personally reviewed following labs and imaging studies  CBC: Recent Labs  Lab 09/23/19 0519 09/24/19 0416 09/25/19 0330 09/25/19 0530 09/27/19 0505 09/28/19 0550  WBC 9.6  --   --  10.3  --  5.4  NEUTROABS 7.9*  --   --  8.0*  --  3.6  HGB 12.5 13.3 12.9 12.4 13.9 12.8  HCT 42.7 39.0 38.0 42.7 41.0 45.3  MCV 96.0  --   --  96.8  --  98.9  PLT 185  --   --  189  --  229    Basic Metabolic Panel: Recent Labs  Lab 09/23/19 0519  09/25/19 0330 09/25/19 0530 09/27/19 0505 09/28/19 0550 09/29/19 0018  NA 137   < > 140 144 146* 144 143  K 4.0   < > 4.6 4.8 3.7 4.1 3.9  CL 95*  --   --  104  --  105 102  CO2 28  --   --  30  --  31 31  GLUCOSE 201*  --   --  122*  --  133* 144*  BUN 23  --   --  31*  --  33* 28*  CREATININE 0.72  --   --  0.69  --  0.81 0.82  CALCIUM 8.4*  --   --  8.9  --  9.0 8.9  MG 2.0  --   --  2.3  --   --   --   PHOS 2.5  --   --   --   --   --   --    < > = values in this interval not displayed.    GFR: Estimated Creatinine Clearance: 85.1 mL/min (by C-G formula based on SCr of 0.82 mg/dL).  Liver Function Tests: Recent Labs  Lab 09/23/19 0519 09/25/19 0530 09/28/19 0550  AST ALT ALKPHOS 45 48 46  BILITOT 0.2* 0.4 1.0  PROT 6.4* 6.5 6.9  ALBUMIN 2.5* 2.6* 3.0*     CBG: Recent Labs  Lab 09/28/19 1123 09/28/19 1739 09/28/19 1908 09/29/19 0003 09/29/19 0405  GLUCAP 190* 154*  138* 156* 138*      Recent Results (from the past 240 hour(s))  MRSA PCR Screening     Status: None   Collection Time: 09/21/19  6:00 PM   Specimen: Nasopharyngeal  Result Value Ref Range Status   MRSA by PCR NEGATIVE NEGATIVE Final    Comment:        The GeneXpert MRSA Assay (FDA approved for NASAL specimens only), is one component of a comprehensive MRSA colonization surveillance program. It is not intended to diagnose MRSA infection nor to guide or monitor treatment for MRSA infections. Performed at [facility], [address] [place], [place] [address]       Radiology Studies: Dg Chest Port 1 View  Result Date: 09/29/2019 CLINICAL DATA:  CHF. EXAM: PORTABLE CHEST 1 VIEW COMPARISON:  09/24/2019.  CT 09/22/2019. FINDINGS: Interim extubation and removal of NG tube. Stable cardiomegaly. Mild pulmonary venous congestion. Bilateral pulmonary infiltrates/edema again noted. Interim slight improvement in aeration of the lung bases. Right upper lobe atelectatic changes noted on today's exam. Tiny left pleural effusion cannot be excluded. No pneumothorax. IMPRESSION: 1.  Interim extubation and removal of NG tube. 2.  Stable cardiomegaly.  Pulmonary venous congestion. 3. Bilateral pulmonary infiltrates/edema again noted. Interim slight improvement aeration in the lung bases. Right upper lobe atelectatic changes noted on today's exam. Tiny left pleural effusion cannot be excluded. Electronically Signed   By: Maisie Fus  Register   On: 09/29/2019 08:01       LOS: 8 days   Osvaldo Shipper  [facility]  Pager on [email or web address]  09/29/2019, 10:15 AM

## 2019-09-30 LAB — GLUCOSE, CAPILLARY
Glucose-Capillary: 139 mg/dL — ABNORMAL HIGH (ref 70–99)
Glucose-Capillary: 142 mg/dL — ABNORMAL HIGH (ref 70–99)
Glucose-Capillary: 157 mg/dL — ABNORMAL HIGH (ref 70–99)
Glucose-Capillary: 164 mg/dL — ABNORMAL HIGH (ref 70–99)
Glucose-Capillary: 184 mg/dL — ABNORMAL HIGH (ref 70–99)
Glucose-Capillary: 186 mg/dL — ABNORMAL HIGH (ref 70–99)

## 2019-09-30 LAB — BASIC METABOLIC PANEL
Anion gap: 9 (ref 5–15)
BUN: 21 mg/dL (ref 8–23)
CO2: 32 mmol/L (ref 22–32)
Calcium: 8.9 mg/dL (ref 8.9–10.3)
Chloride: 98 mmol/L (ref 98–111)
Creatinine, Ser: 0.68 mg/dL (ref 0.44–1.00)
GFR calc Af Amer: >60 mL/min (ref >60)
GFR calc non Af Amer: >60 mL/min (ref >60)
Glucose, Bld: 143 mg/dL — ABNORMAL HIGH (ref 70–99)
Potassium: 3.4 mmol/L — ABNORMAL LOW (ref 3.5–5.1)
Sodium: 139 mmol/L (ref 135–145)

## 2019-09-30 MED ORDER — INSULIN ASPART 100 UNIT/ML ~~LOC~~ SOLN
0.0000 [IU] | Freq: Three times a day (TID) | SUBCUTANEOUS | Status: DC
  Administered 2019-09-30 – 2019-10-02 (×6): 3 [IU] via SUBCUTANEOUS

## 2019-09-30 MED ORDER — POTASSIUM CHLORIDE CRYS ER 20 MEQ PO TBCR
40.0000 meq | EXTENDED_RELEASE_TABLET | Freq: Once | ORAL | Status: AC
  Administered 2019-09-30: 40 meq via ORAL
  Filled 2019-09-30: qty 2

## 2019-09-30 MED ORDER — FLUTICASONE FUROATE-VILANTEROL 200-25 MCG/INH IN AEPB
2.0000 | INHALATION_SPRAY | Freq: Every day | RESPIRATORY_TRACT | Status: DC
  Administered 2019-10-01 – 2019-10-02 (×2): 2 via RESPIRATORY_TRACT
  Filled 2019-09-30 (×3): qty 28

## 2019-09-30 MED ORDER — INSULIN ASPART 100 UNIT/ML ~~LOC~~ SOLN
0.0000 [IU] | Freq: Every day | SUBCUTANEOUS | Status: DC
  Administered 2019-10-01: 2 [IU] via SUBCUTANEOUS

## 2019-09-30 MED ORDER — POTASSIUM CHLORIDE CRYS ER 20 MEQ PO TBCR
20.0000 meq | EXTENDED_RELEASE_TABLET | Freq: Every day | ORAL | Status: DC
  Administered 2019-10-01 – 2019-10-02 (×2): 20 meq via ORAL
  Filled 2019-09-30 (×2): qty 1

## 2019-09-30 NOTE — Progress Notes (Signed)
PROGRESS NOTE  Jackelyn PolingLinda J Riel ZOX:[id number] DOB: 09/15/1951 DOA: 09/21/2019  PCP: Lyndon CodeKhan, Fozia M, MD  Brief History/Interval Summary[facility] a [facility] of 1 L nasal cannula O2 dependent COPD, DM2, HTN, prior CVA, HLD, OSA on CPAP, multiple cerebral aneurysms requiring clipping,andchronicdiastolic CHF who was [facility] on her baseline oxygen support and returned to [facility] ED with progressive worsening shortness of breath. In the emergency room she was found to have oxygen saturation of 70% on 2 L of nasal cannula oxygen. Her blood pressure was stable at presentation. A CXR in the ED noted bilateral infiltrates worse on the left. Arrangements were made for the patient to be [facility].  While waiting for transfer if she decompensated further requiring intubation.  Reason for Visit: Acute respiratory failure with hypoxia and hypercapnia  Consultants: Pulmonology  Procedures: None  Antibiotics: Anti-infectives (From admission, onward)   Start     Dose/Rate Route Frequency Ordered Stop   09/22/19 1600  remdesivir 100 mg in sodium chloride 0.9 % 250 mL IVPB  Status:  Discontinued     100 mg 500 mL/hr over 30 Minutes Intravenous Every 24 hours 09/21/19 1542 09/21/19 1653   09/21/19 1800  ceFEPIme (MAXIPIME) 2 g in sodium chloride 0.9 % 100 mL IVPB     2 g 200 mL/hr over 30 Minutes Intravenous Every 8 hours 09/21/19 1759 09/28/19 1954   09/21/19 1700  remdesivir 200 mg in sodium chloride 0.9 % 250 mL IVPB  Status:  Discontinued     200 mg 500 mL/hr over 30 Minutes Intravenous Once 09/21/19 1542 09/21/19 1653      Subjective/Interval History: Patient states that she has been wheezing some this morning.  Denies any chest pain.  Shortness of breath is improved.  Occasional dry cough.  No nausea vomiting.  Reports good appetite.   Assessment/Plan:  Acute on  chronic hypoxic and hypercapnic respiratory failure Patient was intubated in the ICU for a few days. Etiology thought to be due to combination of COPD exacerbation as well as pneumonia.  Patient recently hospitalized for Covid pneumonia and was treated with remdesivir and steroids.  She was placed back on steroids during this hospitalization.  She has completed course of the same.  She was placed on cefepime for 7 days and has completed the treatment of that as well.   Patient has improved from a respiratory standpoint.  She is on 1 to 2 L of oxygen by nasal cannula.  Saturating in the early 90s.  Continue incentive spirometry and mobilization.  Out of bed to chair.  Continue inhalers.  Increase the dose of her inhaled steroids.  Chest x-ray done yesterday morning showed stable findings.    Recent COVID-19 pneumonia Status post treatment.  Remains stable  Markedly elevated D-dimer CT angiogram of chest without evidence for PE.  Lower extremity Dopplers did not show any DVT.  Continue prophylactic dose of Lovenox.  D-dimer had improved from 11.79 to 3.54.  Chronic diastolic CHF/hypokalemia Replace potassium continue oral Lasix.  Monitor ins and outs and daily weights.  History of obstructive sleep apnea Cannot use CPAP due to hospital policy for Covid patients.  Essential hypertension Noted to be just on furosemide here in the hospital.  On lisinopril at home which is on hold currently.  Blood pressure is reasonably well controlled.  Diabetes mellitus type 2, uncontrolled with hyperglycemia CBGs reasonably well controlled.  HbA1c 6.6 in October.  Noted to be on Metformin at home.    Hyperlipidemia Continue statin  History of GERD Continue H2 blocker  History of stroke Continue Plavix.  PT and OT evaluation.  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  This was also noted on CT scan from 2019.  Although the size seems to have increased some.  She  will need thyroid ultrasound at follow-up.  TSH was 0.289 free T4 1.38 which is noted to be mildly elevated.  Patient currently without any significant symptoms suggestive of hypothyroidism.  Will recommend these levels be rechecked in the outpatient setting.  Morbid Obesity Estimated body mass index is 45.01 kg/m as calculated from the following:   Height as of this encounter: 5\' 5"  (1.651 m).   Weight as of this encounter: 122.7 kg.  DVT Prophylaxis: Lovenox 65 mg every 12 hours PUD Prophylaxis: Famotidine Code Status: Full code Family Communication: Discussed with the patient.  Daughter being updated on a daily basis Disposition Plan: Will need to go to skilled nursing facility for rehab.  Social worker consulted.   Medications:  Scheduled: . atorvastatin  20 mg Oral q1800  . Chlorhexidine Gluconate Cloth  6 each Topical Daily  . citalopram  20 mg Oral Daily  . clopidogrel  75 mg Oral Daily  . cycloSPORINE  1 drop Both Eyes BID  . enoxaparin (LOVENOX) injection  65 mg Subcutaneous Q24H  . famotidine  20 mg Oral BID  . [START ON 10/01/2019] fluticasone furoate-vilanterol  2 puff Inhalation Daily  . furosemide  40 mg Oral BID  . gabapentin  300 mg Oral BID WC  . gabapentin  900 mg Oral QHS  . insulin aspart  0-20 Units Subcutaneous Q4H  . Ipratropium-Albuterol  2 puff Inhalation Q6H  . Melatonin  5 mg Oral QHS  . mirabegron ER  25 mg Oral Daily  . Ensure Max Protein  11 oz Oral BID  . senna  1 tablet Oral BID  . traZODone  25 mg Oral Q2000   Continuous:  10/03/2019, albuterol, guaiFENesin-dextromethorphan, ondansetron **OR** ondansetron (ZOFRAN) IV, polyethylene glycol   Objective:  Vital Signs  Vitals:   09/29/19 2300 09/29/19 2327 09/30/19 0421 09/30/19 0700  BP:  112/68 120/64 (!) 109/56  Pulse:  75 73   Resp:  17 16 18   Temp:  98 F (36.7 C) 97.9 F (36.6 C) 98 F (36.7 C)  TempSrc:  Oral Oral Oral  SpO2: (!) 84% 94% 91%   Weight:      Height:         Intake/Output Summary (Last 24 hours) at 09/30/2019 0937 Last data filed at 09/30/2019 0423 Gross per 24 hour  Intake 600 ml  Output 650 ml  Net -50 ml   Filed Weights   09/25/19 0442 09/26/19 0500 09/27/19 0500  Weight: 128.8 kg 124.2 kg 122.7 kg    General appearance: Awake alert.  In no distress.  Morbidly obese Resp: Mildly tachypneic.  Coarse breath sounds with wheezing bilaterally.  No crackles.   Cardio: S1-S2 is normal regular.  No S3-S4.  No rubs murmurs or bruit GI: Abdomen is soft.  Nontender nondistended.  Bowel sounds are present normal.  No masses organomegaly Extremities: Minimal edema.  Able to move her extremities. Neurologic: No focal neurological deficits.    Lab Results:  Data Reviewed: I have personally reviewed following labs and imaging studies  CBC: Recent Labs  Lab 09/24/19 0416  09/25/19 0330 09/25/19 0530 09/27/19 0505 09/28/19 0550  WBC  --   --  10.3  --  5.4  NEUTROABS  --   --  8.0*  --  3.6  HGB 13.3 12.9 12.4 13.9 12.8  HCT 39.0 38.0 42.7 41.0 45.3  MCV  --   --  96.8  --  98.9  PLT  --   --  189  --  426    Basic Metabolic Panel: Recent Labs  Lab 09/25/19 0530 09/27/19 0505 09/28/19 0550 09/29/19 0018 09/30/19 0414  NA 144 146* 144 143 139  K 4.8 3.7 4.1 3.9 3.4*  CL 104  --  105 102 98  CO2 30  --  31 31 32  GLUCOSE 122*  --  133* 144* 143*  BUN 31*  --  33* 28* 21  CREATININE 0.69  --  0.81 0.82 0.68  CALCIUM 8.9  --  9.0 8.9 8.9  MG 2.3  --   --   --   --     GFR: Estimated Creatinine Clearance: 87.3 mL/min (by C-G formula based on SCr of 0.68 mg/dL).  Liver Function Tests: Recent Labs  Lab 09/25/19 0530 09/28/19 0550  AST 29 20  ALT 22 25  ALKPHOS 48 46  BILITOT 0.4 1.0  PROT 6.5 6.9  ALBUMIN 2.6* 3.0*     CBG: Recent Labs  Lab 09/29/19 2039 09/29/19 2324 09/30/19 0010 09/30/19 0418 09/30/19 0752  GLUCAP 173* 140* 157* 139* 142*      Recent Results (from the past 240 hour(s))  MRSA  PCR Screening     Status: None   Collection Time: 09/21/19  6:00 PM   Specimen: Nasopharyngeal  Result Value Ref Range Status   MRSA by PCR NEGATIVE NEGATIVE Final    Comment:        The GeneXpert MRSA Assay (FDA approved for NASAL specimens only), is one component of a comprehensive MRSA colonization surveillance program. It is not intended to diagnose MRSA infection nor to guide or monitor treatment for MRSA infections. Performed at [facility], [place] [address], [place], [place] [address]       Radiology Studies: Dg Chest Port 1 View  Result Date: 09/29/2019 CLINICAL DATA:  CHF. EXAM: PORTABLE CHEST 1 VIEW COMPARISON:  09/24/2019.  CT 09/22/2019. FINDINGS: Interim extubation and removal of NG tube. Stable cardiomegaly. Mild pulmonary venous congestion. Bilateral pulmonary infiltrates/edema again noted. Interim slight improvement in aeration of the lung bases. Right upper lobe atelectatic changes noted on today's exam. Tiny left pleural effusion cannot be excluded. No pneumothorax. IMPRESSION: 1.  Interim extubation and removal of NG tube. 2.  Stable cardiomegaly.  Pulmonary venous congestion. 3. Bilateral pulmonary infiltrates/edema again noted. Interim slight improvement aeration in the lung bases. Right upper lobe atelectatic changes noted on today's exam. Tiny left pleural effusion cannot be excluded. Electronically Signed   By: Marcello Moores  Register   On: 09/29/2019 08:01       LOS: 9 days   [facility] Pager on [email or web address]  09/30/2019, 9:37 AM

## 2019-09-30 NOTE — Plan of Care (Signed)
Pt slept well during the night. No complaints of pain verbalized. Alert and oriented. Vitals stable on 1L O2 via [place]. Minimal dyspnea on exertion and productive cough noted, prn Robitussin given. Moderate assist with ADLs. Assistance x1 with regular repositioning. Maximove utilized for chair to bed transfer.IV SL. Assisted with regular repositioning for pressure relief. Incontinent of urine, purewick replaced. Unable to tolerate prone positioning  overnight. No other issues, will monitor.    Problem: Activity: Goal: Risk for activity intolerance will decrease Outcome: Progressing   Problem: Coping: Goal: Level of anxiety will decrease Outcome: Progressing   Problem: Clinical Measurements: Goal: Ability to maintain clinical measurements within normal limits will improve Outcome: Progressing Goal: Will remain free from infection Outcome: Progressing Goal: Diagnostic test results will improve Outcome: Progressing Goal: Respiratory complications will improve Outcome: Progressing Goal: Cardiovascular complication will be avoided Outcome: Progressing

## 2019-10-01 LAB — BASIC METABOLIC PANEL
Anion gap: 9 (ref 5–15)
BUN: 16 mg/dL (ref 8–23)
CO2: 31 mmol/L (ref 22–32)
Calcium: 9.1 mg/dL (ref 8.9–10.3)
Chloride: 99 mmol/L (ref 98–111)
Creatinine, Ser: 0.69 mg/dL (ref 0.44–1.00)
GFR calc Af Amer: >60 mL/min (ref >60)
GFR calc non Af Amer: >60 mL/min (ref >60)
Glucose, Bld: 153 mg/dL — ABNORMAL HIGH (ref 70–99)
Potassium: 3.6 mmol/L (ref 3.5–5.1)
Sodium: 139 mmol/L (ref 135–145)

## 2019-10-01 LAB — GLUCOSE, CAPILLARY
Glucose-Capillary: 160 mg/dL — ABNORMAL HIGH (ref 70–99)
Glucose-Capillary: 181 mg/dL — ABNORMAL HIGH (ref 70–99)
Glucose-Capillary: 195 mg/dL — ABNORMAL HIGH (ref 70–99)
Glucose-Capillary: 225 mg/dL — ABNORMAL HIGH (ref 70–99)

## 2019-10-01 NOTE — NC FL2 (Signed)
[place] MEDICAID FL2 LEVEL OF CARE SCREENING TOOL     IDENTIFICATION  Patient Name: Karen Perez Birthdate: 12/29/1950 Sex: female Admission Date (Current Location): 09/21/2019  [facility] [place]   Facility and Address:  [facility], [address], [place], [place] [address]      Provider Number: [id number]  Attending Physician Name and Address:  Osvaldo ShipperKrishnan, Gokul, MD  Relative Name and Phone Number:       Current Level of Care: Hospital Recommended Level of Care: Skilled Nursing Facility Prior Approval Number:    Date Approved/Denied:   PASRR Number: [id number]  Discharge Plan: SNF    Current Diagnoses: Patient Active Problem List   Diagnosis Date Noted  . Pneumonia due to COVID-19 virus 09/21/2019  . Acute hypoxemic respiratory failure due to COVID-19 (HCC) 09/09/2019  . Chronic diastolic heart failure (HCC) 07/08/2018  . Abnormality of gait 07/05/2018  . Acute respiratory failure with hypoxemia (HCC) 02/22/2018  . COPD with acute exacerbation (HCC) 02/22/2018  . Diabetes mellitus type 2 in obese (HCC) 02/22/2018  . Acute respiratory failure with hypoxia and hypercapnia (HCC) 02/22/2018  . HOT FLASHES 05/02/2010  . DIABETES MELLITUS, TYPE II 01/24/2010  . SKIN TAG 10/29/2009  . KNEE PAIN, BILATERAL 10/04/2009  . CHRONIC RHINITIS 08/01/2009  . VOCAL CORD DISORDER 08/01/2009  . GERD 08/01/2009  . HYPOXEMIA 05/21/2009  . Nonspecific (abnormal) findings on radiological and other examination of body structure 05/16/2009  . ABNORMAL CHEST XRAY 05/16/2009  . OBESITY, MORBID 04/30/2009  . TOBACCO USER 04/30/2009  . TINEA CORPORIS 04/19/2009  . HEMOCCULT POSITIVE STOOL 02/07/2009  . LEG CRAMPS 12/24/2008  . DERMATITIS, ALLERGIC 06/28/2008  . BACK PAIN, LUMBAR 05/04/2008  . OBSTRUCTIVE SLEEP APNEA 10/17/2007  . Essential hypertension 04/20/2007  . HYPERLIPIDEMIA 03/21/2007  . DEPRESSION 03/21/2007  . CEREBRAL  ANEURYSM 03/21/2007  . COPD (chronic obstructive pulmonary disease) (HCC) 03/21/2007  . ARTHRITIS 03/21/2007  . DVT, HX OF 03/21/2007    Orientation RESPIRATION BLADDER Height & Weight     Self, Time, Place  O2 Incontinent, External catheter Weight: 270 lb 8.1 oz (122.7 kg) Height:  5\' 5"  (165.1 cm)  BEHAVIORAL SYMPTOMS/MOOD NEUROLOGICAL BOWEL NUTRITION STATUS      Continent Diet(Carb Modified with Thin Liquids)  AMBULATORY STATUS COMMUNICATION OF NEEDS Skin   Limited Assist Verbally Normal                       Personal Care Assistance Level of Assistance  Bathing, Feeding, Dressing Bathing Assistance: Maximum assistance Feeding assistance: Limited assistance Dressing Assistance: Limited assistance     Functional Limitations Info  Sight, Hearing, Speech Sight Info: Adequate Hearing Info: Adequate Speech Info: Adequate    SPECIAL CARE FACTORS FREQUENCY  PT (By licensed PT), OT (By licensed OT)     PT Frequency: 2-3x week OT Frequency: 2-3x week            Contractures Contractures Info: Not present    Additional Factors Info  Code Status Code Status Info: Full Code Allergies Info: Amoxicillin, Amoxicillin-pot Clavulanate, Bacitracin, Neosporin     Isolation Precautions Info: COVID positive     Current Medications (10/01/2019):  This is the current hospital active medication list Current Facility-Administered Medications  Medication Dose Route Frequency Provider Last Rate Last Dose  . acetaminophen (TYLENOL) tablet 650 mg  650 mg Oral Q6H PRN Lynnell CatalanAgarwala, Ravi, MD   650 mg at 10/01/19 2105  . albuterol (  VENTOLIN HFA) 108 (90 Base) MCG/ACT inhaler 4 puff  4 puff Inhalation Q4H PRN Bonnielee Haff, MD   4 puff at 09/28/19 2055  . atorvastatin (LIPITOR) tablet 20 mg  20 mg Oral q1800 Kipp Brood, MD   20 mg at 10/01/19 1742  . Chlorhexidine Gluconate Cloth 2 % PADS 6 each  6 each Topical Daily Kipp Brood, MD   6 each at 10/01/19 [phone]  . citalopram  (CELEXA) tablet 20 mg  20 mg Oral Daily Kipp Brood, MD   20 mg at 10/01/19 0834  . clopidogrel (PLAVIX) tablet 75 mg  75 mg Oral Daily Kipp Brood, MD   75 mg at 10/01/19 0835  . cycloSPORINE (RESTASIS) 0.05 % ophthalmic emulsion 1 drop  1 drop Both Eyes BID Kipp Brood, MD   1 drop at 10/01/19 2054  . enoxaparin (LOVENOX) injection 65 mg  65 mg Subcutaneous Q24H Bonnielee Haff, MD   65 mg at 10/01/19 0834  . famotidine (PEPCID) tablet 20 mg  20 mg Oral BID Kipp Brood, MD   20 mg at 10/01/19 2053  . fluticasone furoate-vilanterol (BREO ELLIPTA) 200-25 MCG/INH 2 puff  2 puff Inhalation Daily Bonnielee Haff, MD   2 puff at 10/01/19 0836  . furosemide (LASIX) tablet 40 mg  40 mg Oral BID Kipp Brood, MD   40 mg at 10/01/19 1743  . gabapentin (NEURONTIN) capsule 300 mg  300 mg Oral BID WC Agarwala, Einar Grad, MD   300 mg at 10/01/19 1742  . gabapentin (NEURONTIN) capsule 900 mg  900 mg Oral QHS Kipp Brood, MD   900 mg at 10/01/19 2053  . guaiFENesin-dextromethorphan (ROBITUSSIN DM) 100-10 MG/5ML syrup 5 mL  5 mL Oral Q4H PRN Bonnielee Haff, MD   5 mL at 10/01/19 2105  . insulin aspart (novoLOG) injection 0-15 Units  0-15 Units Subcutaneous TID WC Bonnielee Haff, MD   3 Units at 10/01/19 1743  . insulin aspart (novoLOG) injection 0-5 Units  0-5 Units Subcutaneous QHS Bonnielee Haff, MD   2 Units at 10/01/19 2105  . Ipratropium-Albuterol (COMBIVENT) respimat 2 puff  2 puff Inhalation Q6H Bonnielee Haff, MD   2 puff at 10/01/19 2055  . Melatonin TABS 5 mg  5 mg Oral QHS Kipp Brood, MD   5 mg at 10/01/19 2053  . mirabegron ER (MYRBETRIQ) tablet 25 mg  25 mg Oral Daily Kipp Brood, MD   25 mg at 10/01/19 0836  . ondansetron (ZOFRAN) tablet 4 mg  4 mg Per Tube Q6H PRN Kipp Brood, MD       Or  . ondansetron (ZOFRAN) injection 4 mg  4 mg Intravenous Q6H PRN Agarwala, Ravi, MD      . polyethylene glycol (MIRALAX / GLYCOLAX) packet 17 g  17 g Oral Daily PRN Agarwala, Ravi, MD       . potassium chloride SA (KLOR-CON) CR tablet 20 mEq  20 mEq Oral Daily Bonnielee Haff, MD   20 mEq at 10/01/19 0835  . protein supplement (ENSURE MAX) liquid  11 oz Oral BID Bonnielee Haff, MD   11 oz at 10/01/19 2053  . senna (SENOKOT) tablet 8.6 mg  1 tablet Oral BID Kipp Brood, MD   8.6 mg at 10/01/19 2053  . traZODone (DESYREL) tablet 25 mg  25 mg Oral Q2000 Kipp Brood, MD   25 mg at 10/01/19 2053     Discharge Medications: Please see discharge summary for a list of discharge medications.  Relevant Imaging Results:  Relevant Lab  Results:   Additional Information SSN [id number]  [place], [place]

## 2019-10-01 NOTE — TOC Initial Note (Signed)
Transition of Care [facility]) - Initial/Assessment Note    Patient Details  Name: Karen Perez MRN: [id number] Date of Birth: 05-16-1951  [facility]) CM/SW Contact:    Maryclare Labrador, RN Phone Number: 10/01/2019, 3:12 PM  Clinical Narrative:   Pt recently admitted to [facility] with [place].  Pt discharged from [employer] back to [facility] of which she is in the LTC section of the facility for approximately 1 year.    CM spoke with pt via phone, pt A & O.  Pt confirms she is from [facility] SNF and plans to return to SNF.  Pt also requested CM to contact daughter.  Daughter also confirms that discharge plan is for pt to return to facility.                 Expected Discharge Plan: Skilled Nursing Facility Barriers to Discharge: Continued Medical Work up   Patient Goals and CMS Choice   CMS Medicare.gov Compare Post Acute Care list provided to:: Patient Choice offered to / list presented to : Patient  Expected Discharge Plan and Services Expected Discharge Plan: [place]       Living arrangements for the past 2 months: Skilled Nursing Facility(Pt has been a resident with [facility] for approximately 1 year)                                      Prior Living Arrangements/Services Living arrangements for the past 2 months: Skilled Nursing Facility(Pt has been a resident with [facility] for approximately 1 year)   Patient language and need for interpreter reviewed:: Yes Do you feel safe going back to the place where you live?: Yes      Need for Family Participation in Patient Care: Yes (Comment) Care giver support system in place?: Yes (comment)   Criminal Activity/Legal Involvement Pertinent to Current Situation/Hospitalization: No - Comment as needed  Activities of Daily Living Home Assistive Devices/Equipment: Wheelchair ADL Screening (condition at time of admission) Patient's cognitive ability adequate to safely complete daily  activities?: No Is the patient deaf or have difficulty hearing?: No Does the patient have difficulty seeing, even when wearing glasses/contacts?: No Does the patient have difficulty concentrating, remembering, or making decisions?: Yes Patient able to express need for assistance with ADLs?: No Does the patient have difficulty dressing or bathing?: Yes Independently performs ADLs?: No Communication: Independent Dressing (OT): Needs assistance Is this a change from baseline?: Pre-admission baseline Grooming: Needs assistance Is this a change from baseline?: Pre-admission baseline Feeding: Independent Bathing: Needs assistance Is this a change from baseline?: Pre-admission baseline Toileting: Dependent Is this a change from baseline?: Pre-admission baseline In/Out Bed: Dependent Is this a change from baseline?: Pre-admission baseline Walks in Home: Dependent Is this a change from baseline?: Pre-admission baseline Does the patient have difficulty walking or climbing stairs?: Yes Weakness of Legs: Both Weakness of Arms/Hands: None  Permission Sought/Granted   Permission granted to share information with : Yes, Verbal Permission Granted  Share Information with NAME: Daughter  [name]  Permission granted to share info w AGENC[facility]: [facility]  Permission granted to share info w Relationship: daughter     Emotional Assessment   Attitude/Demeanor/Rapport: Charismatic Affect (typically observed): Accepting Orientation: : Oriented to Self, Oriented to Place, Oriented to  Time, Oriented to Situation   Psych Involvement: No (comment)  Admission diagnosis:  BACTERIAL PNEUMONIA COVID 19 VIRUS  INFECTION Patient Active Problem List   Diagnosis Date Noted  . Pneumonia due to COVID-19 virus 09/21/2019  . Acute hypoxemic respiratory failure due to COVID-19 (HCC) 09/09/2019  . Chronic diastolic heart failure (HCC) 07/08/2018  . Abnormality of gait 07/05/2018  . Acute respiratory failure with hypoxemia  (HCC) 02/22/2018  . COPD with acute exacerbation (HCC) 02/22/2018  . Diabetes mellitus type 2 in obese (HCC) 02/22/2018  . Acute respiratory failure with hypoxia and hypercapnia (HCC) 02/22/2018  . HOT FLASHES 05/02/2010  . DIABETES MELLITUS, TYPE II 01/24/2010  . SKIN TAG 10/29/2009  . KNEE PAIN, BILATERAL 10/04/2009  . CHRONIC RHINITIS 08/01/2009  . VOCAL CORD DISORDER 08/01/2009  . GERD 08/01/2009  . HYPOXEMIA 05/21/2009  . Nonspecific (abnormal) findings on radiological and other examination of body structure 05/16/2009  . ABNORMAL CHEST XRAY 05/16/2009  . OBESITY, MORBID 04/30/2009  . TOBACCO USER 04/30/2009  . TINEA CORPORIS 04/19/2009  . HEMOCCULT POSITIVE STOOL 02/07/2009  . LEG CRAMPS 12/24/2008  . DERMATITIS, ALLERGIC 06/28/2008  . BACK PAIN, LUMBAR 05/04/2008  . OBSTRUCTIVE SLEEP APNEA 10/17/2007  . Essential hypertension 04/20/2007  . HYPERLIPIDEMIA 03/21/2007  . DEPRESSION 03/21/2007  . CEREBRAL ANEURYSM 03/21/2007  . COPD (chronic obstructive pulmonary disease) (HCC) 03/21/2007  . ARTHRITIS 03/21/2007  . DVT, HX OF 03/21/2007   PCP:  Lyndon Code, MD Pharmacy:   [facility] [address] [place] - [address] [place] [address] Phone: [phone] Fax: [phone]  [facility] - [place], [place] - [address] [place] [address] Phone: [phone] Fax: [phone]     Social Determinants of Health (SDOH) Interventions    Readmission Risk Interventions No flowsheet data found.

## 2019-10-01 NOTE — Progress Notes (Signed)
PROGRESS NOTE  Karen PolingLinda J Perez LKG:[id number] DOB: 08/01/1951 DOA: 09/21/2019  PCP: Lyndon CodeKhan, Fozia M, MD  Brief [facility] a [facility] L nasal cannula O2 dependent COPD, DM2, HTN, prior CVA, HLD, OSA on CPAP, multiple cerebral aneurysms requiring clipping,andchronicdiastolic CHF who [facility] on her baseline oxygen support and [facility] ED with progressive worsening shortness of breath. In the emergency room she was found to have oxygen saturation of 70% on 2 L of nasal cannula oxygen. Her blood pressure was stable at presentation. A CXR in the ED noted bilateral infiltrates worse on the left. Arrangements were made for the patient to [facility].  While waiting for transfer if she decompensated further requiring intubation.  Reason for Visit: Acute respiratory failure with hypoxia and hypercapnia  Consultants: Pulmonology  Procedures: None  Antibiotics: Anti-infectives (From admission, onward)   Start     Dose/Rate Route Frequency Ordered Stop   09/22/19 1600  remdesivir 100 mg in sodium chloride 0.9 % 250 mL IVPB  Status:  Discontinued     100 mg 500 mL/hr over 30 Minutes Intravenous Every 24 hours 09/21/19 1542 09/21/19 1653   09/21/19 1800  ceFEPIme (MAXIPIME) 2 g in sodium chloride 0.9 % 100 mL IVPB     2 g 200 mL/hr over 30 Minutes Intravenous Every 8 hours 09/21/19 1759 09/28/19 1954   09/21/19 1700  remdesivir 200 mg in sodium chloride 0.9 % 250 mL IVPB  Status:  Discontinued     200 mg 500 mL/hr over 30 Minutes Intravenous Once 09/21/19 1542 09/21/19 1653      Subjective/Interval History: Patient sleepy this morning.  Easily arousable.  Denies any shortness of breath or chest pain.  Feels better overall.   Assessment/Plan:  Acute on chronic hypoxic and hypercapnic respiratory failure Patient was  intubated in the ICU for a few days. Etiology thought to be due to combination of COPD exacerbation as well as pneumonia.  Patient recently hospitalized for Covid pneumonia and was treated with remdesivir and steroids.  She was placed back on steroids during this hospitalization.  She has completed course of the same.  She was placed on cefepime for 7 days and has completed the treatment of that as well.   Patient has improved from a respiratory standpoint.  She is requiring 1 to 2 L of oxygen by nasal cannula.  Saturating in the early 90s.  I believe she is on oxygen at baseline also.  Continue incentive spirometry and mobilization.  Out of bed to chair.  Continue with inhalers for wheezing.  Chest x-ray done 2 days ago showed stable findings.     Recent COVID-19 pneumonia Status post treatment.  Remains stable  Markedly elevated D-dimer CT angiogram of chest without evidence for PE.  Lower extremity Dopplers did not show any DVT.  Continue prophylactic dose of Lovenox.  D-dimer had improved from 11.79 to 3.54.  Chronic diastolic CHF/hypokalemia Replace potassium continue oral Lasix.  Monitor ins and outs and daily weights.  History of obstructive sleep apnea Cannot use CPAP due to hospital policy for Covid patients.  Essential hypertension Noted to be just on furosemide here in the hospital.  On lisinopril at home which is on hold currently.  Blood pressure is reasonably well controlled.  Diabetes mellitus type 2, uncontrolled with hyperglycemia CBGs reasonably well controlled.  HbA1c 6.6 in October.  Noted to be on Metformin at home.    Hyperlipidemia Continue statin  History of GERD Continue H2 blocker  History of stroke Continue Plavix.  PT and OT evaluation.  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  This was also noted on CT scan from 2019.  Although the size seems to have increased some.  She will need thyroid ultrasound at follow-up.   TSH was 0.289 free T4 1.38 which is noted to be mildly elevated.  Patient currently without any significant symptoms suggestive of hypothyroidism.  Will recommend these levels be rechecked in the outpatient setting.  Morbid Obesity Estimated body mass index is 45.01 kg/m as calculated from the following:   Height as of this encounter: 5\' 5"  (1.651 m).   Weight as of this encounter: 122.7 kg.  DVT Prophylaxis: Lovenox 65 mg every 12 hours PUD Prophylaxis: Famotidine Code Status: Full code Family Communication: Daughter being updated on a daily basis. Disposition Plan: Will need to go to skilled nursing facility for rehab.  Social worker consulted.   Medications:  Scheduled: . atorvastatin  20 mg Oral q1800  . Chlorhexidine Gluconate Cloth  6 each Topical Daily  . citalopram  20 mg Oral Daily  . clopidogrel  75 mg Oral Daily  . cycloSPORINE  1 drop Both Eyes BID  . enoxaparin (LOVENOX) injection  65 mg Subcutaneous Q24H  . famotidine  20 mg Oral BID  . fluticasone furoate-vilanterol  2 puff Inhalation Daily  . furosemide  40 mg Oral BID  . gabapentin  300 mg Oral BID WC  . gabapentin  900 mg Oral QHS  . insulin aspart  0-15 Units Subcutaneous TID WC  . insulin aspart  0-5 Units Subcutaneous QHS  . Ipratropium-Albuterol  2 puff Inhalation Q6H  . Melatonin  5 mg Oral QHS  . mirabegron ER  25 mg Oral Daily  . potassium chloride  20 mEq Oral Daily  . Ensure Max Protein  11 oz Oral BID  . senna  1 tablet Oral BID  . traZODone  25 mg Oral Q2000   Continuous:  , albuterol, guaiFENesin-dextromethorphan, ondansetron **OR** ondansetron (ZOFRAN) IV, polyethylene glycol   Objective:  Vital Signs  Vitals:   09/30/19 1615 09/30/19 1958 10/01/19 0400 10/01/19 0746  BP: 118/68 128/69 120/61 135/65  Pulse: 75 82 74 77  Resp: 20 (!) 21 18 20   Temp: 98.2 F (36.8 C) 98.4 F (36.9 C) 98.6 F (37 C) 98.6 F (37 C)  TempSrc: Oral Oral Oral Oral  SpO2: 94% 93% 92%  91%  Weight:      Height:        Intake/Output Summary (Last 24 hours) at 10/01/2019 1128 Last data filed at 09/30/2019 1938 Gross per 24 hour  Intake -  Output 1200 ml  Net -1200 ml   Filed Weights   09/25/19 0442 09/26/19 0500 09/27/19 0500  Weight: 128.8 kg 124.2 kg 122.7 kg    General appearance: Awake alert.  In no distress.  Morbidly obese Resp: Mildly tachypneic.  Coarse breath sounds with wheezing bilaterally.  No crackles.  Less wheezing today compared to yesterday. Cardio: S1-S2 is normal regular.  No S3-S4.  No rubs murmurs or bruit GI: Abdomen is soft.  Nontender nondistended.  Bowel sounds are present normal.  No masses organomegaly Extremities: Minimal edema.  Able to move her extremities. Neurologic: No focal neurological deficits.    Lab Results:  Data Reviewed: I have personally reviewed following  labs and imaging studies  CBC: Recent Labs  Lab 09/25/19 0330 09/25/19 0530 09/27/19 0505 09/28/19 0550  WBC  --  10.3  --  5.4  NEUTROABS  --  8.0*  --  3.6  HGB 12.9 12.4 13.9 12.8  HCT 38.0 42.7 41.0 45.3  MCV  --  96.8  --  98.9  PLT  --  189  --  732    Basic Metabolic Panel: Recent Labs  Lab 09/25/19 0530 09/27/19 0505 09/28/19 0550 09/29/19 0018 09/30/19 0414 10/01/19 0220  NA 144 146* 144 143 139 139  K 4.8 3.7 4.1 3.9 3.4* 3.6  CL 104  --  105 102 98 99  CO2 30  --  31 31 32 31  GLUCOSE 122*  --  133* 144* 143* 153*  BUN 31*  --  33* 28* 21 16  CREATININE 0.69  --  0.81 0.82 0.68 0.69  CALCIUM 8.9  --  9.0 8.9 8.9 9.1  MG 2.3  --   --   --   --   --     GFR: Estimated Creatinine Clearance: 87.3 mL/min (by C-G formula based on SCr of 0.69 mg/dL).  Liver Function Tests: Recent Labs  Lab 09/25/19 0530 09/28/19 0550  AST 29 20  ALT 22 25  ALKPHOS 48 46  BILITOT 0.4 1.0  PROT 6.5 6.9  ALBUMIN 2.6* 3.0*     CBG: Recent Labs  Lab 09/30/19 0752 09/30/19 1143 09/30/19 1720 09/30/19 2018 10/01/19 0737  GLUCAP 142* 184*  164* 186* 160*      Recent Results (from the past 240 hour(s))  MRSA PCR Screening     Status: None   Collection Time: 09/21/19  6:00 PM   Specimen: Nasopharyngeal  Result Value Ref Range Status   MRSA by PCR NEGATIVE NEGATIVE Final    Comment:        The GeneXpert MRSA Assay (FDA approved for NASAL specimens only), is one component of a comprehensive MRSA colonization surveillance program. It is not intended to diagnose MRSA infection nor to guide or monitor treatment for MRSA infections. Performed at [facility], [place] [address] [place], [place] [address]       Radiology Studies: No results found.     LOS: 10 days   Alinda Egolf [employer] on [email or web address]  10/01/2019, 11:28 AM

## 2019-10-02 LAB — BASIC METABOLIC PANEL
Anion gap: 10 (ref 5–15)
BUN: 16 mg/dL (ref 8–23)
CO2: 32 mmol/L (ref 22–32)
Calcium: 9.2 mg/dL (ref 8.9–10.3)
Chloride: 99 mmol/L (ref 98–111)
Creatinine, Ser: 0.71 mg/dL (ref 0.44–1.00)
GFR calc Af Amer: >60 mL/min (ref >60)
GFR calc non Af Amer: >60 mL/min (ref >60)
Glucose, Bld: 128 mg/dL — ABNORMAL HIGH (ref 70–99)
Potassium: 3.5 mmol/L (ref 3.5–5.1)
Sodium: 141 mmol/L (ref 135–145)

## 2019-10-02 LAB — GLUCOSE, CAPILLARY
Glucose-Capillary: 153 mg/dL — ABNORMAL HIGH (ref 70–99)
Glucose-Capillary: 206 mg/dL — ABNORMAL HIGH (ref 70–99)

## 2019-10-02 MED ORDER — FUROSEMIDE 20 MG PO TABS: 60.0000 mg | ORAL_TABLET | Freq: Every day | ORAL | Status: AC

## 2019-10-02 MED ORDER — FUROSEMIDE 20 MG PO TABS
60.0000 mg | ORAL_TABLET | Freq: Every day | ORAL | Status: DC
  Administered 2019-10-02: 60 mg via ORAL
  Filled 2019-10-02: qty 3

## 2019-10-02 MED ORDER — CITALOPRAM HYDROBROMIDE 20 MG PO TABS: 20.0000 mg | ORAL_TABLET | Freq: Every day | ORAL | Status: AC

## 2019-10-02 MED ORDER — POLYETHYLENE GLYCOL 3350 17 G PO PACK: 17.0000 g | PACK | Freq: Every day | ORAL | 0 refills | Status: AC | PRN

## 2019-10-02 NOTE — Progress Notes (Signed)
Called and left message for pt's daughter [name]). Spoke to other daughter, Herbert Spires about pt D/C.

## 2019-10-02 NOTE — TOC Transition Note (Addendum)
Transition of Care [facility]) - CM/SW Discharge Note   Patient Details  Name: [name] MRN: [id number] Date of Birth: [birth year]  [facility]) CM/SW Contact:  Carles Collet, RN Phone Number: 10/02/2019, 10:36 AM   Clinical Narrative:     Antonieta Loveless from [facility]. She is able to accept back today. She will go to bed 308B. RN to call report to main number at [phone] and ask to speak to the [place]. DC info sent to [facility] through Hub as requested. Spoke w patient's daughter [name] at [phone] who is agreeable for patient to return today. Patient will transfer via [place]. PTAR forms will be printed to [facility] printer.   I have requested RN caring for this patient to let me know when she is ready for me to call PTAR.   Spoke w charge nurse Jarrett Soho who will print out PTAR forms. She instructed me to call PTAR and set up transport. I reminded her that report needed to be called to SNF, she verified it would be done.    Final next level of care: Skilled Nursing Facility Barriers to Discharge: No Barriers Identified   Patient Goals and CMS Choice   CMS Medicare.gov Compare Post Acute Care list provided to:: Patient Choice offered to / list presented to : Patient  Discharge Placement                       Discharge Plan and Services                                     Social Determinants of Health (SDOH) Interventions     Readmission Risk Interventions No flowsheet data found.

## 2019-10-02 NOTE — Discharge Summary (Signed)
[facility]  Physician Discharge Summary   Patient ID: Karen Perez MRN: [id number] DOB/AGE: 69-Mar-1952 69 y.o.  Admit date: 09/21/2019 Discharge date: 10/02/2019  PCP: Lyndon Code, MD  DISCHARGE DIAGNOSES:  Acute on chronic hypoxic and hypercapnic respiratory failure Acute COPD exacerbation Recent COVID-19 pneumonia Chronic diastolic CHF History of obstructive sleep apnea Essential hypertension Diabetes mellitus type 2 uncontrolled with hyperglycemia Hyperlipidemia History of GERD History of stroke Incidental right thyroid mass requiring outpatient evaluation   RECOMMENDATIONS FOR OUTPATIENT FOLLOW UP: 1. Patient will need thyroid ultrasound to evaluate the right thyroid lesion seen since 2019 2. Recheck TSH and free T4 in 3 to 4 weeks. 3. Aspiration precautions   Home Health: None Equipment/Devices: Home oxygen  CODE STATUS: Full code  DISCHARGE CONDITION: fair  Diet recommendation: Modified carbohydrate  [facility] a [facility] 1 L nasal cannula O2 dependent COPD, DM2, HTN, prior CVA, HLD, OSA on CPAP, multiple cerebral aneurysms requiring clipping,andchronicdiastolic CHF who was discharged [facility] 10/29 on her baseline oxygen support and returned to [facility] ED with progressive worsening shortness of breath. In the emergency room she was found to have oxygen saturation of 70% on 2 L of nasal cannula oxygen. Her blood pressure was stable at presentation. A CXR in the ED noted bilateral infiltrates worse on the left. Arrangements were made for the patient to be transferred from [facility] back to the [facility] progressive care unit.  While waiting for transfer if she decompensated further requiring intubation.  Consultations:  Pulmonology    HOSPITAL COURSE:   Acute on chronic hypoxic and hypercapnic respiratory failure Patient was intubated in the ICU for a few days. Etiology  thought to be due to combination of COPD exacerbation as well as pneumonia.  Patient recently hospitalized for Covid pneumonia and was treated with remdesivir and steroids. She was placed back on steroids during this hospitalization.  She has completed course of the same.  She was placed on cefepime for 7 days and has completed the treatment of that as well.   Patient has improved from respiratory standpoint.  She is requiring 1 to 2 L of oxygen by nasal cannula.  Continue incentive spirometry.  Continue to mobilize.  Continue with inhalers.  Chest x-ray done 3 days ago showed stable findings.  Aspiration precautions while she is being fed.     Recent COVID-19 pneumonia Status post treatment.  Remains stable.  Markedly elevated D-dimer CT angiogram of chest without evidence for PE.  Lower extremity Dopplers did not show any DVT.  D-dimer had improved from 11.79 to 3.54.  Chronic diastolic CHF/hypokalemia Continue Lasix.  Continue with potassium.  Monitor volume status.    History of obstructive sleep apnea Could not use CPAP due to hospital policy for Covid patients.  May resume at the skilled nursing facility.  Essential hypertension Okay to resume lisinopril.  Diabetes mellitus type 2, uncontrolled with hyperglycemia HbA1c was 6.6 in October.  Continue with Metformin.  Hyperlipidemia Continue statin  History of GERD   History of stroke Continue Plavix.    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  This was also noted on CT scan from 2019.  Although the size seems to have increased some.  She will need thyroid ultrasound at follow-up.  TSH was 0.289 free T4 1.38 which is noted to be mildly elevated.  Patient currently without any significant symptoms suggestive of hypothyroidism.  Will recommend  these levels be rechecked in the outpatient setting.  Morbid Obesity Estimated body mass index is 45.01 kg/m as calculated from the  following:   Height as of this encounter:  (1.651 m).   Weight as of this encounter: 122.7 kg.  Overall patient remained stable.  Okay for discharge to skilled nursing facility today.    PERTINENT LABS:  The results of significant diagnostics from this hospitalization (including imaging, microbiology, ancillary and laboratory) are listed below for reference.     Labs:  COVID-19 Labs   Lab Results  Component Value Date   SARSCOV2NAA POSITIVE (A) 09/09/2019      Basic Metabolic Panel: Recent Labs  Lab 09/28/19 0550 09/29/19 0018 09/30/19 0414 10/01/19 0220 10/02/19 0250  NA 144 143 139 139 141  K 4.1 3.9 3.4* 3.6 3.5  CL 105 102 98 99 99  CO2 31 31 32 31 32  GLUCOSE 133* 144* 143* 153* 128*  BUN 33* 28* CREATININE 0.81 0.82 0.68 0.69 0.71  CALCIUM 9.0 8.9 8.9 9.1 9.2   Liver Function Tests: Recent Labs  Lab 09/28/19 0550  AST 20  ALT 25  ALKPHOS 46  BILITOT 1.0  PROT 6.9  ALBUMIN 3.0*   CBC: Recent Labs  Lab 09/27/19 0505 09/28/19 0550  WBC  --  5.4  NEUTROABS  --  3.6  HGB 13.9 12.8  HCT 41.0 45.3  MCV  --  98.9  PLT  --  229   BNP: BNP (last 3 results) Recent Labs    09/14/19 0105 09/21/19 0601 09/25/19 0530  BNP 20.3 58.0 37.4    CBG: Recent Labs  Lab 09/30/19 2018 10/01/19 0737 10/01/19 1215 10/01/19 1721 10/01/19 2052  GLUCAP 186* 160* 181* 195* 225*     IMAGING STUDIES Ct Angio Chest Pe W Or Wo Contrast  Result Date: 09/22/2019 CLINICAL DATA:  Hypoxia. EXAM: CT ANGIOGRAPHY CHEST WITH CONTRAST TECHNIQUE: Multidetector CT imaging of the chest was performed using the standard protocol during bolus administration of intravenous contrast. Multiplanar CT image reconstructions and MIPs were obtained to evaluate the vascular anatomy. CONTRAST:  75mL OMNIPAQUE IOHEXOL 350 MG/ML SOLN COMPARISON:  July 10, 2019 CT angiogram chest; chest radiograph September 22, 2019 FINDINGS: Cardiovascular: There is no demonstrable  pulmonary embolus. No thoracic aortic aneurysm or dissection is appreciable. Foci of calcification in visualized great vessels is noted as is aortic atherosclerotic calcification. Foci of coronary artery calcification noted as well. Heart remains mildly enlarged. No pericardial effusion or pericardial thickening is evident. Mediastinum/Nodes: The known mass in the right lobe of the thyroid remains, better seen on prior study. Subcarinal lymph node measures 1.3 x 1.1 cm currently. Pretracheal lymph node slightly to the right of midline also measures 1.3 x 1.1 cm. No new or enlarging lymph nodes evident. No esophageal lesions are evident. Nasogastric tube passes through the esophagus into the stomach. Lungs/Pleura: Patient is intubated with endotracheal tube tip in the distal trachea. No major airways obstruction is appreciable. There is extensive airspace consolidation in the left upper lobe which is new compared to most recent study. Patchy airspace opacity with ground-glass type opacity has increased in the right upper lobe since previous study. There are foci of consolidation in both lower lobes. No appreciable pleural effusions. Upper Abdomen: Visualized upper abdominal structures appear unremarkable. Musculoskeletal: There is degenerative change in the thoracic spine. No blastic or lytic bone lesions. No chest wall lesions. Review of the MIP images confirms the above findings. IMPRESSION:  1. No demonstrable pulmonary embolus. No thoracic aortic aneurysm or dissection. There is aortic atherosclerosis as well as foci of coronary artery calcification. Stable cardiac enlargement. 2. Progression of airspace disease, most notably in the left upper lobe. Scattered areas of consolidation noted, most prominently in the left upper lobe. 3.  Tube positions as described.  No pneumothorax. 4. Mildly enlarged lymph nodes are stable. No progression of adenopathy since prior study. 5. Right lobe thyroid mass. Advise nonemergent  thyroid ultrasound to assess when patient able. Aortic Atherosclerosis (ICD10-I70.0). Electronically Signed   By: Bretta Bang III M.D.   On: 09/22/2019 13:03    Dg Chest Port 1 View  Result Date: 09/29/2019 CLINICAL DATA:  CHF. EXAM: PORTABLE CHEST 1 VIEW COMPARISON:  09/24/2019.  CT 09/22/2019. FINDINGS: Interim extubation and removal of NG tube. Stable cardiomegaly. Mild pulmonary venous congestion. Bilateral pulmonary infiltrates/edema again noted. Interim slight improvement in aeration of the lung bases. Right upper lobe atelectatic changes noted on today's exam. Tiny left pleural effusion cannot be excluded. No pneumothorax. IMPRESSION: 1.  Interim extubation and removal of NG tube. 2.  Stable cardiomegaly.  Pulmonary venous congestion. 3. Bilateral pulmonary infiltrates/edema again noted. Interim slight improvement aeration in the lung bases. Right upper lobe atelectatic changes noted on today's exam. Tiny left pleural effusion cannot be excluded. Electronically Signed   By: Maisie Fus  Register   On: 09/29/2019 08:01   Dg Chest Port 1 View  Result Date: 09/24/2019 CLINICAL DATA:  Respiratory failure EXAM: PORTABLE CHEST 1 VIEW COMPARISON:  09/22/2019 FINDINGS: Endotracheal tube with the tip 3.6 cm above the carina. Nasogastric tube seen to the level of the lower thoracic esophagus, but the distal aspect is not well visualized. Bilateral diffuse mild interstitial thickening. Left basilar airspace disease unchanged from the prior exam. No pleural effusion or pneumothorax. Stable cardiomegaly. No acute osseous abnormality. IMPRESSION: Cardiomegaly with pulmonary vascular congestion. Left lower lobe airspace disease which likely reflects atelectasis. Endotracheal tube in satisfactory position. Distal aspect of the nasogastric tube not visualized. Electronically Signed   By: Elige Ko   On: 09/24/2019 14:19   Portable Chest Xray  Result Date: 09/22/2019 CLINICAL DATA:  Acute respiratory failure  with hypoxemia EXAM: PORTABLE CHEST 1 VIEW COMPARISON:  Yesterday FINDINGS: Endotracheal tube tip at the clavicular heads. The orogastric tube at least reaches the stomach. Cardiomegaly and vascular pedicle widening. Diffuse interstitial and airspace opacity asymmetric to the right upper lobe. Aeration is mildly improved from before. IMPRESSION: 1. Stable hardware positioning. 2. Improved right upper lobe aeration. 3. Cardiomegaly and vascular congestion. Electronically Signed   By: Marnee Spring M.D.   On: 09/22/2019 08:17   Dg Chest Portable 1 View  Result Date: 09/21/2019 CLINICAL DATA:  S/p intubations/p ETT and OG EXAM: PORTABLE CHEST 1 VIEW COMPARISON:  Radiograph 09/21/2019 FINDINGS: Endotracheal tube placed 5 cm from carina. NG tube extends into the stomach. Stable enlarged cardiac silhouette. Airspace disease in the RIGHT upper lobe is similar. Fluid along the RIGHT fissure. Central venous congestion. No pneumothorax. IMPRESSION: 1. Endotracheal tube 5 cm from carina. 2. No change in RIGHT upper lobe pneumonia. 3. Central venous congestion. Electronically Signed   By: Genevive Bi M.D.   On: 09/21/2019 10:32   Dg Chest Port 1 View  Result Date: 09/21/2019 CLINICAL DATA:  Respiratory distress EXAM: PORTABLE CHEST 1 VIEW COMPARISON:  09/09/2019 FINDINGS: There are hazy airspace opacities bilaterally with more focal consolidation involving the right upper lobe. The lung volumes are low. There is  no clear pneumothorax. No large pleural effusion. There is elevation of the right hemidiaphragm. Prominent interstitial lung markings are noted. There is cardiomegaly. IMPRESSION: 1. Persistent bilateral hazy airspace opacities with more focal consolidation in the right upper lobe. Findings are consistent with the patient's known diagnosis of viral pneumonia. 2. Cardiomegaly with prominent interstitial lung markings. This may be secondary to volume overload or the patient's known atypical infectious  process. Electronically Signed   By: Katherine Mantle M.D.   On: 09/21/2019 06:14   Dg Abd Portable 1 View  Result Date: 09/21/2019 CLINICAL DATA:  Nasogastric tube placement EXAM: PORTABLE ABDOMEN - 1 VIEW COMPARISON:  None. FINDINGS: The enteric tube tip and side-port are over the proximal stomach. Retrocardiac opacity. There is contemporaneous chest x-ray. Upper abdominal bowel gas pattern is normal. IMPRESSION: Enteric tube with tip at the proximal stomach. Electronically Signed   By: Marnee Spring M.D.   On: 09/21/2019 10:31   Vas [place] Lower Extremity Venous (dvt)  Result Date: 09/23/2019  Lower Venous Study Indications: Elevated d-dimer. Other Indications: Covid positive. Anticoagulation: Lovenox. Limitations: Body habitus. Comparison Study: BLEV study on 03/20/18. Negative, but technically limited. Performing Technologist: Marilynne Halsted RDMS, RVT  Examination Guidelines: A complete evaluation includes B-mode imaging, spectral Doppler, color Doppler, and power Doppler as needed of all accessible portions of each vessel. Bilateral testing is considered an integral part of a complete examination. Limited examinations for reoccurring indications may be performed as noted.  +---------+---------------+---------+-----------+----------+--------------+  RIGHT     Compressibility Phasicity Spontaneity Properties Thrombus Aging  +---------+---------------+---------+-----------+----------+--------------+  CFV       Full            Yes       Yes                                    +---------+---------------+---------+-----------+----------+--------------+  SFJ       Full                                                             +---------+---------------+---------+-----------+----------+--------------+  FV Prox   Full                                                             +---------+---------------+---------+-----------+----------+--------------+  FV Mid    Full                                                              +---------+---------------+---------+-----------+----------+--------------+  FV Distal Full                                                             +---------+---------------+---------+-----------+----------+--------------+  PFV       Full                                                             +---------+---------------+---------+-----------+----------+--------------+  POP       Full            Yes       Yes                                    +---------+---------------+---------+-----------+----------+--------------+  PTV       Full                                                             +---------+---------------+---------+-----------+----------+--------------+  PERO                                                       Not visualized  +---------+---------------+---------+-----------+----------+--------------+   +---------+---------------+---------+-----------+----------+--------------+  LEFT      Compressibility Phasicity Spontaneity Properties Thrombus Aging  +---------+---------------+---------+-----------+----------+--------------+  CFV       Full            Yes       Yes                                    +---------+---------------+---------+-----------+----------+--------------+  SFJ       Full                                                             +---------+---------------+---------+-----------+----------+--------------+  FV Prox   Full                                                             +---------+---------------+---------+-----------+----------+--------------+  FV Mid    Full                                                             +---------+---------------+---------+-----------+----------+--------------+  FV Distal Full                                                             +---------+---------------+---------+-----------+----------+--------------+  PFV       Full                                                              +---------+---------------+---------+-----------+----------+--------------+  POP       Full            Yes       Yes                                    +---------+---------------+---------+-----------+----------+--------------+  PTV       Full                                                             +---------+---------------+---------+-----------+----------+--------------+  PERO                                                       Not visualized  +---------+---------------+---------+-----------+----------+--------------+    Summary: Right: There is no evidence of deep vein thrombosis in the lower extremity. However, portions of this examination were limited- see technologist comments above. Left: There is no evidence of deep vein thrombosis in the lower extremity. However, portions of this examination were limited- see technologist comments above.  *See table(s) above for measurements and observations. Electronically signed by Gretta Beganodd Early MD on 09/23/2019 at 7:04:14 AM.    Final     DISCHARGE EXAMINATION: Vitals:   10/01/19 1929 10/02/19 0001 10/02/19 0400 10/02/19 0751  BP: (!) 107/56 99/73 122/70 129/66  Pulse: 81 78 69 75  Resp: 20 18 18    Temp: 98.2 F (36.8 C) 98 F (36.7 C) 97.9 F (36.6 C)   TempSrc:      SpO2: 92% 91% 92% 90%  Weight:      Height:       General appearance: Awake alert.  In no distress Resp: Improved effort.  Diminished air entry at the bases with Few crackles.  Upper airway wheezing is noted. Cardio: S1-S2 is normal regular.  No S3-S4.  No rubs murmurs or bruit GI: Abdomen is soft.  Nontender nondistended.  Bowel sounds are present normal.  No masses organomegaly    DISPOSITION: SNF  Discharge Instructions    Call MD for:  difficulty breathing, headache or visual disturbances   Complete by: As directed    Call MD for:  extreme fatigue   Complete by: As directed    Call MD for:  persistant dizziness or light-headedness   Complete by: As directed    Call MD  for:  persistant nausea and vomiting   Complete by: As directed    Call MD for:  severe uncontrolled pain   Complete by: As directed    Discharge instructions   Complete by: As directed    Please see discharge summary for details.  COVID 19 INSTRUCTIONS  - You are felt to be stable enough  to no longer require inpatient monitoring, testing, and treatment, though you will need to follow the recommendations below: - Based on the CDC's non-test criteria for ending self-isolation: You may not return to work/leave the home until at least 21 days since symptom onset AND 3 days without a fever (without taking tylenol, ibuprofen, etc.) AND have improvement in respiratory symptoms. - Do not take NSAID medications (including, but not limited to, ibuprofen, advil, motrin, naproxen, aleve, goody's powder, etc.) - Follow up with your doctor in the next week via telehealth or seek medical attention right away if your symptoms get WORSE.  - Consider donating plasma after you have recovered (either 14 days after a negative test or 28 days after symptoms have completely resolved) because your antibodies to this virus may be helpful to give to others with life-threatening infections. Please go to the website www.[email or web address] if you would like to consider volunteering for plasma donation.    Directions for you at home:  Wear a facemask You should wear a facemask that covers your nose and mouth when you are in the same room with other people and when you visit a healthcare provider. People who live with or visit you should also wear a facemask while they are in the same room with you.  Separate yourself from other people in your home As much as possible, you should stay in a different room from other people in your home. Also, you should use a separate bathroom, if available.  Avoid sharing household items You should not share dishes, drinking glasses, cups, eating utensils, towels, bedding, or other items  with other people in your home. After using these items, you should wash them thoroughly with soap and water.  Cover your coughs and sneezes Cover your mouth and nose with a tissue when you cough or sneeze, or you can cough or sneeze into your sleeve. Throw used tissues in a lined trash can, and immediately wash your hands with soap and water for at least 20 seconds or use an alcohol-based hand rub.  Wash your [facility] your hands often and thoroughly with soap and water for at least 20 seconds. You can use an alcohol-based hand sanitizer if soap and water are not available and if your hands are not visibly dirty. Avoid touching your eyes, nose, and mouth with unwashed hands.  Directions for those who live with, or provide care at home for you:  Limit the number of people who have contact with the patient If possible, have only one caregiver for the patient. Other household members should stay in another home or place of residence. If this is not possible, they should stay in another room, or be separated from the patient as much as possible. Use a separate bathroom, if available. Restrict visitors who do not have an essential need to be in the home.  Ensure good ventilation Make sure that shared spaces in the home have good air flow, such as from an air conditioner or an opened window, weather permitting.  Wash your hands often Wash your hands often and thoroughly with soap and water for at least 20 seconds. You can use an alcohol based hand sanitizer if soap and water are not available and if your hands are not visibly dirty. Avoid touching your eyes, nose, and mouth with unwashed hands. Use disposable paper towels to dry your hands. If not available, use dedicated cloth towels and replace them when they become wet.  Wear a facemask and gloves Wear  a disposable facemask at all times in the room and gloves when you touch or have contact with the patients blood, body fluids, and/or  secretions or excretions, such as sweat, saliva, sputum, nasal mucus, vomit, urine, or feces.  Ensure the mask fits over your nose and mouth tightly, and do not touch it during use. Throw out disposable facemasks and gloves after using them. Do not reuse. Wash your hands immediately after removing your facemask and gloves. If your personal clothing becomes contaminated, carefully remove clothing and launder. Wash your hands after handling contaminated clothing. Place all used disposable facemasks, gloves, and other waste in a lined container before disposing them with other household waste. Remove gloves and wash your hands immediately after handling these items.  Do not share dishes, glasses, or other household items with the patient Avoid sharing household items. You should not share dishes, drinking glasses, cups, eating utensils, towels, bedding, or other items with a patient who is confirmed to have, or being evaluated for, COVID-19 infection. After the person uses these items, you should wash them thoroughly with soap and water.  Wash laundry thoroughly Immediately remove and wash clothes or bedding that have blood, body fluids, and/or secretions or excretions, such as sweat, saliva, sputum, nasal mucus, vomit, urine, or feces, on them. Wear gloves when handling laundry from the patient. Read and follow directions on labels of laundry or clothing items and detergent. In general, wash and dry with the warmest temperatures recommended on the label.  Clean all areas the individual has used often Clean all touchable surfaces, such as counters, tabletops, doorknobs, bathroom fixtures, toilets, phones, keyboards, tablets, and bedside tables, every day. Also, clean any surfaces that may have blood, body fluids, and/or secretions or excretions on them. Wear gloves when cleaning surfaces the patient has come in contact with. Use a diluted bleach solution (e.g., dilute bleach with 1 part bleach and 10  parts water) or a household disinfectant with a label that says EPA-registered for coronaviruses. To make a bleach solution at home, add 1 tablespoon of bleach to 1 quart (4 cups) of water. For a larger supply, add  cup of bleach to 1 gallon (16 cups) of water. Read labels of cleaning products and follow recommendations provided on product labels. Labels contain instructions for safe and effective use of the cleaning product including precautions you should take when applying the product, such as wearing gloves or eye protection and making sure you have good ventilation during use of the product. Remove gloves and wash hands immediately after cleaning.  Monitor yourself for signs and symptoms of illness Caregivers and household members are considered close contacts, should monitor their health, and will be asked to limit movement outside of the home to the extent possible. Follow the monitoring steps for close contacts listed on the symptom monitoring form.   If you have additional questions, contact your local health department or call the epidemiologist on call at [phone] (available 24/7). This guidance is subject to change. For the most up-to-date guidance from [facility], please refer to their website: [email or web address]   You were cared for by a hospitalist during your hospital stay. If you have any questions about your discharge medications or the care you received while you were in the hospital after you are discharged, you can call the unit and asked to speak with the hospitalist on call if the hospitalist that took care of you is not available. Once you are discharged, your primary care physician will handle any  further medical issues. Please note that NO REFILLS for any discharge medications will be authorized once you are discharged, as it is imperative that you return to your primary care physician (or establish a relationship with a  primary care physician if you do not have one) for your aftercare needs so that they can reassess your need for medications and monitor your lab values. If you do not have a primary care physician, you can call [phone] for a physician referral.   Increase activity slowly   Complete by: As directed         Allergies as of 10/02/2019      Reactions   Amoxicillin    Amoxicillin-pot Clavulanate Diarrhea   Has patient had a PCN reaction causing immediate rash, facial/tongue/throat swelling, SOB or lightheadedness with hypotension: No Has patient had a PCN reaction causing severe rash involving mucus membranes or skin necrosis: No Has patient had a PCN reaction that required hospitalization: No Has patient had a PCN reaction occurring within the last 10 years: No If all of the above answers are "NO", then may proceed with Cephalosporin use.   Bacitracin    Neosporin [neomycin-bacitracin Zn-polymyx] Hives      Medication List    STOP taking these medications   methylPREDNISolone 4 MG Tbpk tablet Commonly known as: MEDROL DOSEPAK   Theraworx Relief Foam   traMADol 50 MG tablet Commonly known as: ULTRAM     TAKE these medications   acetaminophen 500 MG tablet Commonly known as: TYLENOL Take 1,000 mg by mouth 3 (three) times daily.   albuterol 108 (90 Base) MCG/ACT inhaler Commonly known as: VENTOLIN HFA Inhale 2 puffs into the lungs every 6 (six) hours as needed for wheezing or shortness of breath.   atorvastatin 20 MG tablet Commonly known as: LIPITOR Take 1 tablet (20 mg total) by mouth daily. What changed: additional instructions   citalopram 20 MG tablet Commonly known as: CELEXA Take 1 tablet (20 mg total) by mouth daily.   clopidogrel 75 MG tablet Commonly known as: PLAVIX Take 1 tablet (75 mg total) by mouth daily.   clotrimazole 1 % cream Commonly known as: LOTRIMIN Apply to affected area 2 times daily   cyclobenzaprine 5 MG tablet Commonly known as:  FLEXERIL Take 5 mg by mouth 3 (three) times daily as needed.   dextromethorphan-guaiFENesin 10-100 MG/5ML liquid Commonly known as: ROBITUSSIN-DM Take 10 mLs by mouth every 4 (four) hours as needed for cough.   diclofenac sodium 1 % Gel Commonly known as: VOLTAREN Apply 2 g topically 2 (two) times daily. Apply to knees, feet BID   docusate sodium 100 MG capsule Commonly known as: COLACE Take by mouth 2 (two) times daily.   fluticasone 50 MCG/ACT nasal spray Commonly known as: FLONASE Place 1 spray into both nostrils daily.   Fluticasone-Salmeterol 500-50 MCG/DOSE Aepb Commonly known as: ADVAIR Inhale 1 puff into the lungs 2 (two) times daily.   furosemide 20 MG tablet Commonly known as: LASIX Take 3 tablets (60 mg total) by mouth daily. What changed:   medication strength  how much to take  when to take this   gabapentin 300 MG capsule Commonly known as: NEURONTIN Take 900 mg by mouth daily at 8 pm.   gabapentin 300 MG capsule Commonly known as: NEURONTIN Take 300 mg by mouth 2 (two) times daily. At 08:00 and 14:00   guaiFENesin 100 MG/5ML Soln Commonly known as: ROBITUSSIN Take 10 mLs by mouth every 4 (four) hours as  needed for cough or to loosen phlegm.   lisinopril 2.5 MG tablet Commonly known as: ZESTRIL Take 1 tablet (2.5 mg total) by mouth daily.   loratadine 10 MG tablet Commonly known as: CLARITIN Take 10 mg by mouth daily.   Melatonin 5 MG Tabs Take 5 mg by mouth at bedtime.   metFORMIN 500 MG tablet Commonly known as: GLUCOPHAGE Take 500 mg by mouth 2 (two) times daily with a meal.   Myrbetriq 25 MG Tb24 tablet Generic drug: mirabegron ER Take 25 mg by mouth daily.   pantoprazole 20 MG tablet Commonly known as: PROTONIX Take 20 mg by mouth daily as needed.   polyethylene glycol 17 g packet Commonly known as: MIRALAX / GLYCOLAX Take 17 g by mouth daily as needed for moderate constipation.   potassium chloride 10 MEQ tablet Commonly  known as: KLOR-CON TAKE 2 TABLETS BY MOUTH EVERY DAY   Refresh Celluvisc 1 % Gel Generic drug: Carboxymethylcellulose Sod PF Apply 1 drop to eye 4 (four) times daily.   Restasis 0.05 % ophthalmic emulsion Generic drug: cycloSPORINE Place 1 drop into both eyes 2 (two) times daily.   solifenacin 5 MG tablet Commonly known as: VESICARE Take 1 tablet (5 mg total) by mouth daily.   tiotropium 18 MCG inhalation capsule Commonly known as: Spiriva HandiHaler Use once day   traZODone 50 MG tablet Commonly known as: DESYREL Take 25 mg by mouth daily at 8 pm.        Follow-up Information    Lyndon Code, MD. Schedule an appointment as soon as possible for a visit in 1 week(s).   Specialty: Internal Medicine Contact information: [address] [place] [address] [phone]           TOTAL DISCHARGE TIME: 35 minutes  Lewi Drost Rito Ehrlich  [facility] Pager on [email or web address]  10/02/2019, 9:47 AM

## 2019-10-02 NOTE — Discharge Instructions (Signed)

## 2019-10-02 NOTE — Progress Notes (Signed)
Called and gave report at [facility] to the RN

## 2019-10-03 LAB — GLUCOSE, CAPILLARY: Glucose-Capillary: 175 mg/dL — ABNORMAL HIGH (ref 70–99)

## 2019-11-01 ENCOUNTER — Encounter: Payer: Self-pay | Admitting: Urology

## 2019-11-01 ENCOUNTER — Ambulatory Visit (INDEPENDENT_AMBULATORY_CARE_PROVIDER_SITE_OTHER): Payer: Medicare Other | Admitting: Urology

## 2019-11-01 ENCOUNTER — Other Ambulatory Visit: Payer: Self-pay

## 2019-11-01 VITALS — BP 114/70 | HR 90 | Ht 65.0 in | Wt 286.0 lb

## 2019-11-01 DIAGNOSIS — R3915 Urgency of urination: Secondary | ICD-10-CM | POA: Diagnosis not present

## 2019-11-01 DIAGNOSIS — N3941 Urge incontinence: Secondary | ICD-10-CM

## 2019-11-01 NOTE — Patient Instructions (Signed)
Percutaneous Nerve Evaluation for Tibial Nerve Stimulation  Tibial nerve stimulation is a treatment that uses a small needle above the inner ankle that sends mild electrical impulses to the sacral nerves. The sacral nerves control several functions in the lower part of the body, including bladder and bowel functions. Sacral nerve stimulation can be used to treat disorders that make it hard to control urination and bowel movements. Before having a sacral nerve stimulator implanted, you will go through a trial known as a percutaneous nerve evaluation. This trial helps determine if sacral nerve stimulation will help your condition. For the trial, a procedure will be done to insert temporary wires into your body. Electric pulses will travel through these wires (electrodes) to an area close to your sacral nerves. The electrodes are connected to the temporary nerve stimulator, which remains outside of your body. The nerve stimulator will send electric pulses during the trial period, which usually lasts 1-2 weeks. Tell a health care provider about:  Any allergies you have.  All medicines you are taking, including vitamins, herbs, eye drops, creams, and over-the-counter medicines.  Any problems you or family members have had with anesthetic medicines.  Any blood disorders you have.  Any surgeries you have had.  Any medical conditions you have.  Whether you are pregnant or may be pregnant. What are the risks? Generally, this is a safe procedure. However, problems may occur, including:  Uncomfortable sensations, such as a jolting or shocking feeling. What happens before the procedure? Medicines Ask your health care provider about:  Changing or stopping your regular medicines. This is especially important if you are taking diabetes medicines or blood thinners.  Taking medicines such as aspirin and ibuprofen. These medicines can thin your blood. Do not take these medicines unless your health care  provider tells you to take them.  Taking over-the-counter medicines, vitamins, herbs, and supplements. General instructions  Follow instructions from your health care provider about eating or drinking restrictions.  Ask your health care provider what steps will be taken to help prevent infection. These may include: ? Removing hair at the surgery site. ? Washing skin with a germ-killing soap. ? Taking antibiotic medicine. What happens during the procedure?  You will be given a medicine to numb the area (local anesthetic).  Long needles will be inserted into your lower back.  The needles will be guided to the place where the nerves exit the backbone. Your health care provider may use a type of X-ray (fluoroscopy) to guide the needles to the right spot.  The position of the needles will be tested. If they are in the right spot, your toes or feet may move. If you are awake, you may feel a tingling in your legs.  Electrodes will be inserted through the needles and into your body.  The electrodes will be anchored in place close to your sacral nerves.  The ends of the electrodes outside your body will be connected to a nerve stimulator device.  A bandage (dressing) will be applied to cover the electrodes.  Your health care provider will program the rate at which the nerve stimulator will deliver the electronic pulses. The procedure may vary among health care providers and hospitals. What happens after the procedure?  Your blood pressure, heart rate, breathing rate, and blood oxygen level will be monitored until you leave the hospital or clinic.  You will wear the sacral nerve stimulator for the trial period. You will be taught how to use the device.  You may be given pain medicine as needed. Summary  Sacral nerve stimulation is a treatment that uses an implanted device that sends mild electrical impulses to the sacral nerves.  This procedure will determine if a sacral nerve  stimulator will help you. If so, you will need to have a second procedure to have a permanent device implanted.  The sacral nerves control several functions in the lower part of the body, including bladder and bowel functions.  Sacral nerve stimulation can be used to treat disorders that make it hard to control urination and bowel movements. This information is not intended to replace advice given to you by your health care provider. Make sure you discuss any questions you have with your health care provider. Document Released: 10/19/2012 Document Revised: 12/26/2018 Document Reviewed: 12/26/2018 Elsevier Patient Education  2020 [employer].

## 2019-11-01 NOTE — Progress Notes (Signed)
11/01/2019 2:39 PM   Karen Perez 10/30/1951 [id number]  Referring provider: Lyndon CodeKhan, Fozia M, MD [address] [place] [phone]  Chief Complaint  Patient presents with  . Urinary Incontinence    HPI:  F/u -   Urgency and urge incontinence. She has foot on the floor and might lose her whole bladder. She has some frequency. No gross hematuria. She drinks water and cranberry. She voids with a good flow. No constipation. No NG risk. No prolapse symptoms.  She started solifenacin 5 mg July 2020. PVR was 95 ml. She wears a pullup. She has bothersome urgency and can't make it to the toilet in time. She has limited mobility. She has dry mouth and constipation. We added three week trial of Myrbetriq.   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 [facility] with COVID pneumonia on her baseline oxygen support and [facility] with progressive worsening shortness of breath and was admitted Nov 2020.   She returns today and she has urgency. It is  Bothersome. She has limited mobility. Doing better after COVID. Urine clear. No gross hematuria.   PMH: Past Medical History:  Diagnosis Date  . Arthritis   . Asthma   . Brain aneurysm   . CHF (congestive heart failure) (HCC)   . COPD (chronic obstructive pulmonary disease) (HCC)   . Diabetes mellitus without complication (HCC)   . Hypertension   . Stroke [facility](HCC) 2003    Surgical History: Past Surgical History:  Procedure Laterality Date  . CEREBRAL ANEURYSM REPAIR  2013    Home Medications:  Allergies as of 11/01/2019      Reactions   Amoxicillin    Amoxicillin-pot Clavulanate Diarrhea   Has patient had a PCN reaction causing immediate rash, facial/tongue/throat swelling, SOB or lightheadedness with hypotension: No Has patient had  a PCN reaction causing severe rash involving mucus membranes or skin necrosis: No Has patient had a PCN reaction that required hospitalization: No Has patient had a PCN reaction occurring within the last 10 years: No If all of the above answers are "NO", then may proceed with Cephalosporin use.   Bacitracin    Neosporin [neomycin-bacitracin Zn-polymyx] Hives      Medication List       Accurate as of November 01, 2019  2:39 PM. If you have any questions, ask your nurse or doctor.        acetaminophen 500 MG tablet Commonly known as: TYLENOL Take 1,000 mg by mouth 3 (three) times daily.   albuterol 108 (90 Base) MCG/ACT inhaler Commonly known as: VENTOLIN HFA Inhale 2 puffs into the lungs every 6 (six) hours as needed for wheezing or shortness of breath.   atorvastatin 20 MG tablet Commonly known as: LIPITOR Take 1 tablet (20 mg total) by mouth daily. What changed: additional instructions   citalopram 20 MG tablet Commonly known as: CELEXA Take 1 tablet (20 mg total) by mouth daily.   clopidogrel 75 MG tablet Commonly known as: PLAVIX Take 1 tablet (75 mg total) by mouth daily.   clotrimazole 1 % cream Commonly known as: LOTRIMIN Apply to affected area 2 times daily   cyclobenzaprine 5 MG tablet Commonly known as: FLEXERIL Take 5 mg by mouth 3 (three) times daily as needed.   dextromethorphan-guaiFENesin 10-100 MG/5ML liquid Commonly known as:  ROBITUSSIN-DM Take 10 mLs by mouth every 4 (four) hours as needed for cough.   diclofenac sodium 1 % Gel Commonly known as: VOLTAREN Apply 2 g topically 2 (two) times daily. Apply to knees, feet BID   docusate sodium 100 MG capsule Commonly known as: COLACE Take by mouth 2 (two) times daily.   fluticasone 50 MCG/ACT nasal spray Commonly known as: FLONASE Place 1 spray into both nostrils daily.   Fluticasone-Salmeterol 500-50 MCG/DOSE Aepb Commonly known as: ADVAIR Inhale 1 puff into the lungs 2 (two) times daily.     furosemide 20 MG tablet Commonly known as: LASIX Take 3 tablets (60 mg total) by mouth daily.   gabapentin 300 MG capsule Commonly known as: NEURONTIN Take 900 mg by mouth daily at 8 pm.   gabapentin 300 MG capsule Commonly known as: NEURONTIN Take 300 mg by mouth 2 (two) times daily. At 08:00 and 14:00   guaiFENesin 100 MG/5ML Soln Commonly known as: ROBITUSSIN Take 10 mLs by mouth every 4 (four) hours as needed for cough or to loosen phlegm.   lisinopril 2.5 MG tablet Commonly known as: ZESTRIL Take 1 tablet (2.5 mg total) by mouth daily.   loratadine 10 MG tablet Commonly known as: CLARITIN Take 10 mg by mouth daily.   Melatonin 5 MG Tabs Take 5 mg by mouth at bedtime.   metFORMIN 500 MG tablet Commonly known as: GLUCOPHAGE Take 500 mg by mouth 2 (two) times daily with a meal.   Myrbetriq 25 MG Tb24 tablet Generic drug: mirabegron ER Take 25 mg by mouth daily.   pantoprazole 20 MG tablet Commonly known as: PROTONIX Take 20 mg by mouth daily as needed.   polyethylene glycol 17 g packet Commonly known as: MIRALAX / GLYCOLAX Take 17 g by mouth daily as needed for moderate constipation.   potassium chloride 10 MEQ tablet Commonly known as: KLOR-CON TAKE 2 TABLETS BY MOUTH EVERY DAY   Refresh Celluvisc 1 % Gel Generic drug: Carboxymethylcellulose Sod PF Apply 1 drop to eye 4 (four) times daily.   Restasis 0.05 % ophthalmic emulsion Generic drug: cycloSPORINE Place 1 drop into both eyes 2 (two) times daily.   solifenacin 5 MG tablet Commonly known as: VESICARE Take 1 tablet (5 mg total) by mouth daily.   tiotropium 18 MCG inhalation capsule Commonly known as: Spiriva HandiHaler Use once day   traZODone 50 MG tablet Commonly known as: DESYREL Take 25 mg by mouth daily at 8 pm.       Allergies:  Allergies  Allergen Reactions  . Amoxicillin   . Amoxicillin-Pot Clavulanate Diarrhea    Has patient had a PCN reaction causing immediate rash,  facial/tongue/throat swelling, SOB or lightheadedness with hypotension: No Has patient had a PCN reaction causing severe rash involving mucus membranes or skin necrosis: No Has patient had a PCN reaction that required hospitalization: No Has patient had a PCN reaction occurring within the last 10 years: No If all of the above answers are "NO", then may proceed with Cephalosporin use.   . Bacitracin   . Neosporin [Neomycin-Bacitracin Zn-Polymyx] Hives    Family History: Family History  Problem Relation Age of Onset  . Diabetes Mellitus II Daughter     Social History:  reports that she quit smoking about 20 months ago. Her smoking use included cigarettes. She has a 50.00 pack-year smoking history. She has never used smokeless tobacco. She reports that she does not drink alcohol or use drugs.  ROS: UROLOGY Frequent Urination?:  No Hard to postpone urination?: No Burning/pain with urination?: No Get up at night to urinate?: No Leakage of urine?: No Urine stream starts and stops?: No Trouble starting stream?: No Do you have to strain to urinate?: No Blood in urine?: No Urinary tract infection?: No Sexually transmitted disease?: No Injury to kidneys or bladder?: No Painful intercourse?: No Weak stream?: No Currently pregnant?: No Vaginal bleeding?: No Last menstrual period?: n  Gastrointestinal Nausea?: No Vomiting?: No Indigestion/heartburn?: No Diarrhea?: No Constipation?: No  Constitutional Fever: No Night sweats?: No Weight loss?: No Fatigue?: No  Skin Skin rash/lesions?: No Itching?: No  Eyes Blurred vision?: No Double vision?: No  Ears/Nose/Throat Sore throat?: No Sinus problems?: No  Hematologic/Lymphatic Swollen glands?: No Easy bruising?: No  Cardiovascular Leg swelling?: No Chest pain?: No  Respiratory Cough?: No Shortness of breath?: Yes  Endocrine Excessive thirst?: No  Musculoskeletal Back pain?: No Joint pain?:  No  Neurological Headaches?: No Dizziness?: No  Psychologic Depression?: No Anxiety?: No  Physical Exam: BP 114/70   Pulse 90   Ht 5\' 5"  (1.651 m)   Wt 286 lb (129.7 kg)   BMI 47.59 kg/m   Constitutional:  Alert and oriented, No acute distress. In wheelchair.  HEENT: [place] AT, moist mucus membranes.  Trachea midline, no masses. Cardiovascular: No clubbing, cyanosis, or edema. Respiratory: Normal respiratory effort, no increased work of breathing. GI: Abdomen is soft, nontender, nondistended, no abdominal masses GU: No CVA tendernes Skin: No rashes, bruises or suspicious lesions. Neurologic: Grossly intact, no focal deficits, moving all 4 extremities. Psychiatric: Normal mood and affect.  Laboratory Data: Lab Results  Component Value Date   WBC 5.4 09/28/2019   HGB 12.8 09/28/2019   HCT 45.3 09/28/2019   MCV 98.9 09/28/2019   PLT 229 09/28/2019    Lab Results  Component Value Date   CREATININE 0.71 10/02/2019    No results found for: PSA  No results found for: TESTOSTERONE  Lab Results  Component Value Date   HGBA1C 6.6 (H) 09/10/2019    Urinalysis    Component Value Date/Time   COLORURINE YELLOW (A) 09/09/2019 0556   APPEARANCEUR HAZY (A) 09/09/2019 0556   LABSPEC 1.017 09/09/2019 0556   PHURINE 6.0 09/09/2019 0556   GLUCOSEU NEGATIVE 09/09/2019 0556   HGBUR NEGATIVE 09/09/2019 0556   HGBUR large 02/07/2009 0000   BILIRUBINUR NEGATIVE 09/09/2019 0556   KETONESUR NEGATIVE 09/09/2019 0556   PROTEINUR NEGATIVE 09/09/2019 0556   UROBILINOGEN negative 02/07/2009 0000   NITRITE NEGATIVE 09/09/2019 0556   LEUKOCYTESUR NEGATIVE 09/09/2019 0556    Lab Results  Component Value Date   BACTERIA MANY (A) 09/09/2019    Pertinent Imaging: N/a  No results found for this or any previous visit. No results found for this or any previous visit. No results found for this or any previous visit. No results found for this or any previous visit. No results found  for this or any previous visit. No results found for this or any previous visit. No results found for this or any previous visit. No results found for this or any previous visit.  Assessment & Plan:    Frequency and urgency - drinks cranberry and water. She's tried medication and we discussed the nature r/b/a to PTNS and she will start.   No follow-ups on file.  09/11/2019, MD  [facility] [address] [place], [place] [phone]

## 2019-12-18 DEATH — deceased

## 2020-04-26 ENCOUNTER — Ambulatory Visit: Payer: Medicare Other | Admitting: Urology

## 2024-06-13 NOTE — Progress Notes (Signed)
 This encounter was created in error - please disregard.
